# Patient Record
Sex: Female | Born: 1970 | Race: White | Hispanic: No | Smoking: Never smoker
Health system: Southern US, Community
[De-identification: ages and names within clinical notes are randomized; demographics above are authoritative.]

## PROBLEM LIST (undated history)

## (undated) DIAGNOSIS — G8929 Other chronic pain: Secondary | ICD-10-CM

## (undated) DIAGNOSIS — E109 Type 1 diabetes mellitus without complications: Secondary | ICD-10-CM

## (undated) DIAGNOSIS — B9689 Other specified bacterial agents as the cause of diseases classified elsewhere: Secondary | ICD-10-CM

## (undated) DIAGNOSIS — Z9151 Personal history of suicidal behavior: Secondary | ICD-10-CM

## (undated) DIAGNOSIS — F339 Major depressive disorder, recurrent, unspecified: Secondary | ICD-10-CM

## (undated) DIAGNOSIS — IMO0002 Reserved for concepts with insufficient information to code with codable children: Secondary | ICD-10-CM

## (undated) DIAGNOSIS — J208 Acute bronchitis due to other specified organisms: Secondary | ICD-10-CM

## (undated) DIAGNOSIS — Z87442 Personal history of urinary calculi: Secondary | ICD-10-CM

## (undated) DIAGNOSIS — Z915 Personal history of self-harm: Secondary | ICD-10-CM

## (undated) DIAGNOSIS — Z8719 Personal history of other diseases of the digestive system: Secondary | ICD-10-CM

## (undated) DIAGNOSIS — Z9889 Other specified postprocedural states: Secondary | ICD-10-CM

## (undated) DIAGNOSIS — R112 Nausea with vomiting, unspecified: Secondary | ICD-10-CM

## (undated) DIAGNOSIS — K9081 Whipple's disease: Secondary | ICD-10-CM

## (undated) DIAGNOSIS — K219 Gastro-esophageal reflux disease without esophagitis: Secondary | ICD-10-CM

## (undated) DIAGNOSIS — K589 Irritable bowel syndrome without diarrhea: Secondary | ICD-10-CM

## (undated) DIAGNOSIS — Z8679 Personal history of other diseases of the circulatory system: Secondary | ICD-10-CM

## (undated) DIAGNOSIS — E1165 Type 2 diabetes mellitus with hyperglycemia: Secondary | ICD-10-CM

## (undated) DIAGNOSIS — F419 Anxiety disorder, unspecified: Secondary | ICD-10-CM

## (undated) DIAGNOSIS — G43909 Migraine, unspecified, not intractable, without status migrainosus: Secondary | ICD-10-CM

## (undated) DIAGNOSIS — N201 Calculus of ureter: Secondary | ICD-10-CM

## (undated) HISTORY — PX: OTHER SURGICAL HISTORY: SHX169

## (undated) HISTORY — PX: WHIPPLE PROCEDURE: SHX2667

## (undated) HISTORY — DX: Type 1 diabetes mellitus without complications: E10.9

## (undated) HISTORY — DX: Anxiety disorder, unspecified: F41.9

## (undated) HISTORY — DX: Migraine, unspecified, not intractable, without status migrainosus: G43.909

## (undated) HISTORY — PX: TUBAL LIGATION: SHX77

## (undated) HISTORY — PX: ESOPHAGOGASTRODUODENOSCOPY: SHX1529

---

## 2000-08-08 HISTORY — PX: LAPAROSCOPIC NISSEN FUNDOPLICATION: SHX1932

## 2001-03-02 ENCOUNTER — Inpatient Hospital Stay (HOSPITAL_COMMUNITY): Admission: EM | Admit: 2001-03-02 | Discharge: 2001-03-06 | Payer: Self-pay | Admitting: *Deleted

## 2001-03-12 ENCOUNTER — Inpatient Hospital Stay (HOSPITAL_COMMUNITY): Admission: EM | Admit: 2001-03-12 | Discharge: 2001-03-13 | Payer: Self-pay | Admitting: Psychiatry

## 2001-03-20 ENCOUNTER — Emergency Department (HOSPITAL_COMMUNITY): Admission: EM | Admit: 2001-03-20 | Discharge: 2001-03-20 | Payer: Self-pay | Admitting: Emergency Medicine

## 2001-03-22 ENCOUNTER — Emergency Department (HOSPITAL_COMMUNITY): Admission: EM | Admit: 2001-03-22 | Discharge: 2001-03-22 | Payer: Self-pay

## 2001-03-22 ENCOUNTER — Emergency Department (HOSPITAL_COMMUNITY): Admission: EM | Admit: 2001-03-22 | Discharge: 2001-03-22 | Payer: Self-pay | Admitting: Emergency Medicine

## 2001-03-22 ENCOUNTER — Emergency Department (HOSPITAL_COMMUNITY): Admission: EM | Admit: 2001-03-22 | Discharge: 2001-03-23 | Payer: Self-pay | Admitting: Emergency Medicine

## 2001-03-24 ENCOUNTER — Emergency Department (HOSPITAL_COMMUNITY): Admission: EM | Admit: 2001-03-24 | Discharge: 2001-03-24 | Payer: Self-pay | Admitting: Emergency Medicine

## 2001-04-11 ENCOUNTER — Emergency Department (HOSPITAL_COMMUNITY): Admission: EM | Admit: 2001-04-11 | Discharge: 2001-04-11 | Payer: Self-pay | Admitting: Emergency Medicine

## 2001-04-20 ENCOUNTER — Emergency Department (HOSPITAL_COMMUNITY): Admission: EM | Admit: 2001-04-20 | Discharge: 2001-04-20 | Payer: Self-pay | Admitting: Emergency Medicine

## 2001-04-27 ENCOUNTER — Emergency Department (HOSPITAL_COMMUNITY): Admission: EM | Admit: 2001-04-27 | Discharge: 2001-04-27 | Payer: Self-pay | Admitting: Emergency Medicine

## 2001-04-30 ENCOUNTER — Other Ambulatory Visit: Admission: RE | Admit: 2001-04-30 | Discharge: 2001-04-30 | Payer: Self-pay | Admitting: Gynecology

## 2001-05-19 ENCOUNTER — Emergency Department (HOSPITAL_COMMUNITY): Admission: EM | Admit: 2001-05-19 | Discharge: 2001-05-19 | Payer: Self-pay | Admitting: Emergency Medicine

## 2001-05-20 ENCOUNTER — Emergency Department (HOSPITAL_COMMUNITY): Admission: EM | Admit: 2001-05-20 | Discharge: 2001-05-20 | Payer: Self-pay | Admitting: Emergency Medicine

## 2001-06-09 ENCOUNTER — Emergency Department (HOSPITAL_COMMUNITY): Admission: EM | Admit: 2001-06-09 | Discharge: 2001-06-09 | Payer: Self-pay | Admitting: Emergency Medicine

## 2001-06-19 ENCOUNTER — Emergency Department (HOSPITAL_COMMUNITY): Admission: EM | Admit: 2001-06-19 | Discharge: 2001-06-20 | Payer: Self-pay | Admitting: Emergency Medicine

## 2001-08-06 ENCOUNTER — Encounter (HOSPITAL_COMMUNITY): Admission: RE | Admit: 2001-08-06 | Discharge: 2001-09-05 | Payer: Self-pay | Admitting: Neurology

## 2001-10-01 ENCOUNTER — Emergency Department (HOSPITAL_COMMUNITY): Admission: EM | Admit: 2001-10-01 | Discharge: 2001-10-01 | Payer: Self-pay | Admitting: Emergency Medicine

## 2001-10-05 ENCOUNTER — Emergency Department (HOSPITAL_COMMUNITY): Admission: EM | Admit: 2001-10-05 | Discharge: 2001-10-05 | Payer: Self-pay | Admitting: Emergency Medicine

## 2001-10-08 ENCOUNTER — Emergency Department (HOSPITAL_COMMUNITY): Admission: EM | Admit: 2001-10-08 | Discharge: 2001-10-08 | Payer: Self-pay | Admitting: Internal Medicine

## 2001-10-26 ENCOUNTER — Inpatient Hospital Stay (HOSPITAL_COMMUNITY): Admission: EM | Admit: 2001-10-26 | Discharge: 2001-10-31 | Payer: Self-pay | Admitting: Internal Medicine

## 2001-10-26 ENCOUNTER — Encounter: Payer: Self-pay | Admitting: Internal Medicine

## 2001-10-27 ENCOUNTER — Encounter: Payer: Self-pay | Admitting: Internal Medicine

## 2001-11-03 ENCOUNTER — Encounter: Payer: Self-pay | Admitting: Internal Medicine

## 2001-11-03 ENCOUNTER — Ambulatory Visit (HOSPITAL_COMMUNITY): Admission: RE | Admit: 2001-11-03 | Discharge: 2001-11-03 | Payer: Self-pay | Admitting: Internal Medicine

## 2001-11-30 ENCOUNTER — Inpatient Hospital Stay (HOSPITAL_COMMUNITY): Admission: EM | Admit: 2001-11-30 | Discharge: 2001-12-01 | Payer: Self-pay | Admitting: Emergency Medicine

## 2001-12-01 ENCOUNTER — Inpatient Hospital Stay (HOSPITAL_COMMUNITY): Admission: RE | Admit: 2001-12-01 | Discharge: 2001-12-09 | Payer: Self-pay | Admitting: Psychiatry

## 2001-12-31 ENCOUNTER — Inpatient Hospital Stay (HOSPITAL_COMMUNITY): Admission: EM | Admit: 2001-12-31 | Discharge: 2002-01-05 | Payer: Self-pay | Admitting: Psychiatry

## 2002-01-13 ENCOUNTER — Emergency Department (HOSPITAL_COMMUNITY): Admission: EM | Admit: 2002-01-13 | Discharge: 2002-01-13 | Payer: Self-pay | Admitting: Emergency Medicine

## 2002-01-16 ENCOUNTER — Emergency Department (HOSPITAL_COMMUNITY): Admission: EM | Admit: 2002-01-16 | Discharge: 2002-01-16 | Payer: Self-pay | Admitting: Emergency Medicine

## 2002-02-16 ENCOUNTER — Emergency Department (HOSPITAL_COMMUNITY): Admission: EM | Admit: 2002-02-16 | Discharge: 2002-02-16 | Payer: Self-pay | Admitting: *Deleted

## 2002-02-18 ENCOUNTER — Emergency Department (HOSPITAL_COMMUNITY): Admission: EM | Admit: 2002-02-18 | Discharge: 2002-02-18 | Payer: Self-pay | Admitting: Emergency Medicine

## 2002-02-27 ENCOUNTER — Emergency Department (HOSPITAL_COMMUNITY): Admission: EM | Admit: 2002-02-27 | Discharge: 2002-02-28 | Payer: Self-pay | Admitting: *Deleted

## 2002-03-09 ENCOUNTER — Emergency Department (HOSPITAL_COMMUNITY): Admission: EM | Admit: 2002-03-09 | Discharge: 2002-03-09 | Payer: Self-pay | Admitting: Emergency Medicine

## 2002-03-15 ENCOUNTER — Emergency Department (HOSPITAL_COMMUNITY): Admission: EM | Admit: 2002-03-15 | Discharge: 2002-03-15 | Payer: Self-pay | Admitting: Internal Medicine

## 2002-04-07 ENCOUNTER — Emergency Department (HOSPITAL_COMMUNITY): Admission: EM | Admit: 2002-04-07 | Discharge: 2002-04-07 | Payer: Self-pay | Admitting: Emergency Medicine

## 2002-04-10 ENCOUNTER — Emergency Department (HOSPITAL_COMMUNITY): Admission: EM | Admit: 2002-04-10 | Discharge: 2002-04-10 | Payer: Self-pay | Admitting: Internal Medicine

## 2002-04-26 ENCOUNTER — Emergency Department (HOSPITAL_COMMUNITY): Admission: EM | Admit: 2002-04-26 | Discharge: 2002-04-26 | Payer: Self-pay | Admitting: Emergency Medicine

## 2002-04-28 ENCOUNTER — Inpatient Hospital Stay (HOSPITAL_COMMUNITY): Admission: EM | Admit: 2002-04-28 | Discharge: 2002-05-06 | Payer: Self-pay | Admitting: *Deleted

## 2002-04-29 ENCOUNTER — Encounter: Payer: Self-pay | Admitting: Family Medicine

## 2002-05-04 ENCOUNTER — Encounter: Payer: Self-pay | Admitting: Family Medicine

## 2002-05-15 ENCOUNTER — Inpatient Hospital Stay (HOSPITAL_COMMUNITY): Admission: EM | Admit: 2002-05-15 | Discharge: 2002-05-20 | Payer: Self-pay | Admitting: Emergency Medicine

## 2002-05-15 ENCOUNTER — Encounter: Payer: Self-pay | Admitting: Emergency Medicine

## 2002-05-20 ENCOUNTER — Encounter: Payer: Self-pay | Admitting: Internal Medicine

## 2002-06-04 ENCOUNTER — Inpatient Hospital Stay (HOSPITAL_COMMUNITY): Admission: EM | Admit: 2002-06-04 | Discharge: 2002-06-09 | Payer: Self-pay | Admitting: Internal Medicine

## 2002-06-05 ENCOUNTER — Encounter: Payer: Self-pay | Admitting: Internal Medicine

## 2002-07-08 ENCOUNTER — Encounter: Payer: Self-pay | Admitting: Family Medicine

## 2002-07-08 ENCOUNTER — Ambulatory Visit (HOSPITAL_COMMUNITY): Admission: RE | Admit: 2002-07-08 | Discharge: 2002-07-08 | Payer: Self-pay | Admitting: Family Medicine

## 2002-08-10 ENCOUNTER — Encounter: Payer: Self-pay | Admitting: Emergency Medicine

## 2002-08-10 ENCOUNTER — Inpatient Hospital Stay (HOSPITAL_COMMUNITY): Admission: EM | Admit: 2002-08-10 | Discharge: 2002-08-12 | Payer: Self-pay | Admitting: Emergency Medicine

## 2002-08-11 ENCOUNTER — Encounter: Payer: Self-pay | Admitting: Family Medicine

## 2002-10-05 ENCOUNTER — Observation Stay (HOSPITAL_COMMUNITY): Admission: EM | Admit: 2002-10-05 | Discharge: 2002-10-05 | Payer: Self-pay | Admitting: Emergency Medicine

## 2002-11-05 ENCOUNTER — Emergency Department (HOSPITAL_COMMUNITY): Admission: EM | Admit: 2002-11-05 | Discharge: 2002-11-05 | Payer: Self-pay | Admitting: *Deleted

## 2002-12-14 ENCOUNTER — Emergency Department (HOSPITAL_COMMUNITY): Admission: EM | Admit: 2002-12-14 | Discharge: 2002-12-14 | Payer: Self-pay | Admitting: Emergency Medicine

## 2003-01-01 ENCOUNTER — Inpatient Hospital Stay (HOSPITAL_COMMUNITY): Admission: EM | Admit: 2003-01-01 | Discharge: 2003-01-07 | Payer: Self-pay | Admitting: Internal Medicine

## 2003-01-04 ENCOUNTER — Encounter: Payer: Self-pay | Admitting: Family Medicine

## 2003-01-19 ENCOUNTER — Emergency Department (HOSPITAL_COMMUNITY): Admission: EM | Admit: 2003-01-19 | Discharge: 2003-01-19 | Payer: Self-pay | Admitting: *Deleted

## 2003-02-03 ENCOUNTER — Emergency Department (HOSPITAL_COMMUNITY): Admission: EM | Admit: 2003-02-03 | Discharge: 2003-02-03 | Payer: Self-pay | Admitting: *Deleted

## 2003-02-07 ENCOUNTER — Emergency Department (HOSPITAL_COMMUNITY): Admission: EM | Admit: 2003-02-07 | Discharge: 2003-02-07 | Payer: Self-pay | Admitting: Emergency Medicine

## 2003-04-18 ENCOUNTER — Inpatient Hospital Stay (HOSPITAL_COMMUNITY): Admission: EM | Admit: 2003-04-18 | Discharge: 2003-04-24 | Payer: Self-pay | Admitting: Internal Medicine

## 2003-04-18 ENCOUNTER — Encounter: Payer: Self-pay | Admitting: Internal Medicine

## 2003-04-21 ENCOUNTER — Encounter: Payer: Self-pay | Admitting: Family Medicine

## 2003-04-22 ENCOUNTER — Encounter: Payer: Self-pay | Admitting: Family Medicine

## 2003-07-20 ENCOUNTER — Inpatient Hospital Stay (HOSPITAL_COMMUNITY): Admission: EM | Admit: 2003-07-20 | Discharge: 2003-07-27 | Payer: Self-pay | Admitting: Emergency Medicine

## 2003-07-22 ENCOUNTER — Encounter: Payer: Self-pay | Admitting: Family Medicine

## 2003-07-23 ENCOUNTER — Encounter: Payer: Self-pay | Admitting: Family Medicine

## 2003-08-10 ENCOUNTER — Inpatient Hospital Stay (HOSPITAL_COMMUNITY): Admission: EM | Admit: 2003-08-10 | Discharge: 2003-08-15 | Payer: Self-pay | Admitting: Emergency Medicine

## 2003-08-19 ENCOUNTER — Emergency Department (HOSPITAL_COMMUNITY): Admission: EM | Admit: 2003-08-19 | Discharge: 2003-08-19 | Payer: Self-pay | Admitting: Emergency Medicine

## 2003-09-01 ENCOUNTER — Emergency Department (HOSPITAL_COMMUNITY): Admission: EM | Admit: 2003-09-01 | Discharge: 2003-09-01 | Payer: Self-pay | Admitting: Emergency Medicine

## 2003-12-01 ENCOUNTER — Inpatient Hospital Stay (HOSPITAL_COMMUNITY): Admission: EM | Admit: 2003-12-01 | Discharge: 2003-12-03 | Payer: Self-pay | Admitting: Emergency Medicine

## 2004-01-15 ENCOUNTER — Inpatient Hospital Stay (HOSPITAL_COMMUNITY): Admission: EM | Admit: 2004-01-15 | Discharge: 2004-01-19 | Payer: Self-pay | Admitting: Emergency Medicine

## 2004-01-24 ENCOUNTER — Emergency Department (HOSPITAL_COMMUNITY): Admission: EM | Admit: 2004-01-24 | Discharge: 2004-01-24 | Payer: Self-pay | Admitting: Emergency Medicine

## 2004-02-09 ENCOUNTER — Emergency Department (HOSPITAL_COMMUNITY): Admission: EM | Admit: 2004-02-09 | Discharge: 2004-02-09 | Payer: Self-pay | Admitting: Emergency Medicine

## 2004-02-20 ENCOUNTER — Emergency Department (HOSPITAL_COMMUNITY): Admission: EM | Admit: 2004-02-20 | Discharge: 2004-02-20 | Payer: Self-pay | Admitting: Emergency Medicine

## 2004-04-12 ENCOUNTER — Inpatient Hospital Stay (HOSPITAL_COMMUNITY): Admission: EM | Admit: 2004-04-12 | Discharge: 2004-04-17 | Payer: Self-pay | Admitting: Emergency Medicine

## 2004-05-07 ENCOUNTER — Ambulatory Visit: Admission: RE | Admit: 2004-05-07 | Discharge: 2004-05-07 | Payer: Self-pay | Admitting: Family Medicine

## 2004-06-08 ENCOUNTER — Inpatient Hospital Stay (HOSPITAL_COMMUNITY): Admission: EM | Admit: 2004-06-08 | Discharge: 2004-06-12 | Payer: Self-pay | Admitting: Emergency Medicine

## 2004-07-07 ENCOUNTER — Inpatient Hospital Stay (HOSPITAL_COMMUNITY): Admission: EM | Admit: 2004-07-07 | Discharge: 2004-07-12 | Payer: Self-pay | Admitting: Emergency Medicine

## 2004-08-08 ENCOUNTER — Ambulatory Visit: Payer: Self-pay | Admitting: Psychiatry

## 2004-10-24 ENCOUNTER — Ambulatory Visit: Payer: Self-pay | Admitting: Psychiatry

## 2004-11-30 ENCOUNTER — Ambulatory Visit: Payer: Self-pay | Admitting: Psychiatry

## 2004-12-25 ENCOUNTER — Emergency Department (HOSPITAL_COMMUNITY): Admission: EM | Admit: 2004-12-25 | Discharge: 2004-12-25 | Payer: Self-pay | Admitting: Emergency Medicine

## 2005-01-25 ENCOUNTER — Inpatient Hospital Stay (HOSPITAL_COMMUNITY): Admission: EM | Admit: 2005-01-25 | Discharge: 2005-01-29 | Payer: Self-pay | Admitting: Emergency Medicine

## 2005-02-13 ENCOUNTER — Ambulatory Visit: Payer: Self-pay | Admitting: Psychiatry

## 2005-04-03 ENCOUNTER — Ambulatory Visit: Payer: Self-pay | Admitting: Psychiatry

## 2005-06-05 ENCOUNTER — Ambulatory Visit: Payer: Self-pay | Admitting: Psychiatry

## 2005-08-14 ENCOUNTER — Ambulatory Visit: Payer: Self-pay | Admitting: Psychiatry

## 2005-09-11 ENCOUNTER — Ambulatory Visit: Payer: Self-pay | Admitting: Psychiatry

## 2005-11-15 ENCOUNTER — Ambulatory Visit: Payer: Self-pay | Admitting: Psychiatry

## 2006-01-10 ENCOUNTER — Ambulatory Visit (HOSPITAL_COMMUNITY): Payer: Self-pay | Admitting: Psychiatry

## 2006-03-12 ENCOUNTER — Ambulatory Visit (HOSPITAL_COMMUNITY): Payer: Self-pay | Admitting: Psychiatry

## 2006-04-03 ENCOUNTER — Ambulatory Visit (HOSPITAL_COMMUNITY): Admission: RE | Admit: 2006-04-03 | Discharge: 2006-04-03 | Payer: Self-pay | Admitting: Family Medicine

## 2006-04-04 ENCOUNTER — Ambulatory Visit: Payer: Self-pay | Admitting: Internal Medicine

## 2006-05-09 ENCOUNTER — Ambulatory Visit (HOSPITAL_COMMUNITY): Payer: Self-pay | Admitting: Psychiatry

## 2006-06-20 ENCOUNTER — Ambulatory Visit (HOSPITAL_COMMUNITY): Payer: Self-pay | Admitting: Psychiatry

## 2006-06-29 ENCOUNTER — Inpatient Hospital Stay (HOSPITAL_COMMUNITY): Admission: EM | Admit: 2006-06-29 | Discharge: 2006-07-02 | Payer: Self-pay | Admitting: Emergency Medicine

## 2006-06-29 ENCOUNTER — Ambulatory Visit: Payer: Self-pay | Admitting: Urgent Care

## 2006-08-20 ENCOUNTER — Ambulatory Visit (HOSPITAL_COMMUNITY): Payer: Self-pay | Admitting: Psychiatry

## 2006-11-05 ENCOUNTER — Ambulatory Visit (HOSPITAL_COMMUNITY): Payer: Self-pay | Admitting: Psychiatry

## 2006-12-16 ENCOUNTER — Ambulatory Visit (HOSPITAL_COMMUNITY): Admission: RE | Admit: 2006-12-16 | Discharge: 2006-12-17 | Payer: Self-pay | Admitting: Obstetrics and Gynecology

## 2006-12-16 ENCOUNTER — Encounter (INDEPENDENT_AMBULATORY_CARE_PROVIDER_SITE_OTHER): Payer: Self-pay | Admitting: Specialist

## 2006-12-16 HISTORY — PX: LAPAROSCOPIC ASSISTED VAGINAL HYSTERECTOMY: SHX5398

## 2007-01-07 ENCOUNTER — Ambulatory Visit (HOSPITAL_COMMUNITY): Payer: Self-pay | Admitting: Psychiatry

## 2007-01-14 ENCOUNTER — Ambulatory Visit (HOSPITAL_COMMUNITY): Payer: Self-pay | Admitting: Psychiatry

## 2007-01-21 ENCOUNTER — Ambulatory Visit (HOSPITAL_COMMUNITY): Payer: Self-pay | Admitting: Psychiatry

## 2007-02-05 ENCOUNTER — Ambulatory Visit (HOSPITAL_COMMUNITY): Payer: Self-pay | Admitting: Psychiatry

## 2007-04-03 ENCOUNTER — Ambulatory Visit (HOSPITAL_COMMUNITY): Payer: Self-pay | Admitting: Psychiatry

## 2007-04-21 ENCOUNTER — Ambulatory Visit (HOSPITAL_COMMUNITY): Payer: Self-pay | Admitting: Psychiatry

## 2007-05-29 ENCOUNTER — Ambulatory Visit (HOSPITAL_COMMUNITY): Payer: Self-pay | Admitting: Psychiatry

## 2007-08-10 ENCOUNTER — Inpatient Hospital Stay (HOSPITAL_COMMUNITY): Admission: EM | Admit: 2007-08-10 | Discharge: 2007-08-14 | Payer: Self-pay | Admitting: Emergency Medicine

## 2007-09-16 ENCOUNTER — Ambulatory Visit (HOSPITAL_COMMUNITY): Payer: Self-pay | Admitting: Psychiatry

## 2007-10-16 ENCOUNTER — Ambulatory Visit (HOSPITAL_COMMUNITY): Payer: Self-pay | Admitting: Psychiatry

## 2008-01-01 ENCOUNTER — Ambulatory Visit (HOSPITAL_COMMUNITY): Payer: Self-pay | Admitting: Psychiatry

## 2008-03-02 ENCOUNTER — Ambulatory Visit (HOSPITAL_COMMUNITY): Payer: Self-pay | Admitting: Psychiatry

## 2008-04-27 ENCOUNTER — Ambulatory Visit (HOSPITAL_COMMUNITY): Admission: RE | Admit: 2008-04-27 | Discharge: 2008-04-27 | Payer: Self-pay | Admitting: Family Medicine

## 2008-04-29 ENCOUNTER — Ambulatory Visit (HOSPITAL_COMMUNITY): Payer: Self-pay | Admitting: Psychiatry

## 2008-05-22 ENCOUNTER — Emergency Department (HOSPITAL_COMMUNITY): Admission: EM | Admit: 2008-05-22 | Discharge: 2008-05-22 | Payer: Self-pay | Admitting: Emergency Medicine

## 2008-06-29 ENCOUNTER — Ambulatory Visit (HOSPITAL_COMMUNITY): Payer: Self-pay | Admitting: Psychiatry

## 2008-08-01 ENCOUNTER — Emergency Department (HOSPITAL_COMMUNITY): Admission: EM | Admit: 2008-08-01 | Discharge: 2008-08-01 | Payer: Self-pay | Admitting: Emergency Medicine

## 2008-09-14 ENCOUNTER — Ambulatory Visit (HOSPITAL_COMMUNITY): Payer: Self-pay | Admitting: Psychiatry

## 2008-10-17 ENCOUNTER — Emergency Department (HOSPITAL_COMMUNITY): Admission: EM | Admit: 2008-10-17 | Discharge: 2008-10-17 | Payer: Self-pay | Admitting: Emergency Medicine

## 2008-11-23 ENCOUNTER — Ambulatory Visit (HOSPITAL_COMMUNITY): Payer: Self-pay | Admitting: Psychiatry

## 2008-11-27 ENCOUNTER — Emergency Department (HOSPITAL_COMMUNITY): Admission: EM | Admit: 2008-11-27 | Discharge: 2008-11-27 | Payer: Self-pay | Admitting: Emergency Medicine

## 2008-11-27 ENCOUNTER — Encounter: Payer: Self-pay | Admitting: Orthopedic Surgery

## 2008-11-29 ENCOUNTER — Ambulatory Visit: Payer: Self-pay | Admitting: Orthopedic Surgery

## 2008-11-29 DIAGNOSIS — S93409A Sprain of unspecified ligament of unspecified ankle, initial encounter: Secondary | ICD-10-CM | POA: Insufficient documentation

## 2008-12-17 ENCOUNTER — Encounter: Payer: Self-pay | Admitting: Orthopedic Surgery

## 2008-12-30 ENCOUNTER — Observation Stay (HOSPITAL_COMMUNITY): Admission: AD | Admit: 2008-12-30 | Discharge: 2009-01-01 | Payer: Self-pay | Admitting: Family Medicine

## 2008-12-30 ENCOUNTER — Ambulatory Visit: Payer: Self-pay | Admitting: Internal Medicine

## 2008-12-31 ENCOUNTER — Ambulatory Visit: Payer: Self-pay | Admitting: Internal Medicine

## 2008-12-31 ENCOUNTER — Encounter: Payer: Self-pay | Admitting: Internal Medicine

## 2009-01-13 ENCOUNTER — Encounter: Payer: Self-pay | Admitting: Internal Medicine

## 2009-01-17 ENCOUNTER — Encounter: Payer: Self-pay | Admitting: Internal Medicine

## 2009-01-18 ENCOUNTER — Encounter: Payer: Self-pay | Admitting: Internal Medicine

## 2009-02-03 ENCOUNTER — Ambulatory Visit (HOSPITAL_COMMUNITY): Payer: Self-pay | Admitting: Psychiatry

## 2009-02-03 DIAGNOSIS — Z87442 Personal history of urinary calculi: Secondary | ICD-10-CM | POA: Insufficient documentation

## 2009-02-03 DIAGNOSIS — Z8719 Personal history of other diseases of the digestive system: Secondary | ICD-10-CM | POA: Insufficient documentation

## 2009-02-03 DIAGNOSIS — G43909 Migraine, unspecified, not intractable, without status migrainosus: Secondary | ICD-10-CM | POA: Insufficient documentation

## 2009-02-03 DIAGNOSIS — F319 Bipolar disorder, unspecified: Secondary | ICD-10-CM

## 2009-02-03 DIAGNOSIS — K219 Gastro-esophageal reflux disease without esophagitis: Secondary | ICD-10-CM

## 2009-03-03 ENCOUNTER — Inpatient Hospital Stay (HOSPITAL_COMMUNITY): Admission: AD | Admit: 2009-03-03 | Discharge: 2009-03-10 | Payer: Self-pay | Admitting: Family Medicine

## 2009-03-08 ENCOUNTER — Ambulatory Visit: Payer: Self-pay | Admitting: Internal Medicine

## 2009-03-09 ENCOUNTER — Encounter: Payer: Self-pay | Admitting: Internal Medicine

## 2009-03-14 ENCOUNTER — Ambulatory Visit: Payer: Self-pay | Admitting: Internal Medicine

## 2009-03-14 ENCOUNTER — Ambulatory Visit (HOSPITAL_COMMUNITY): Admission: RE | Admit: 2009-03-14 | Discharge: 2009-03-14 | Payer: Self-pay | Admitting: Internal Medicine

## 2009-03-14 HISTORY — PX: COLONOSCOPY: SHX174

## 2009-03-15 ENCOUNTER — Ambulatory Visit (HOSPITAL_COMMUNITY): Payer: Self-pay | Admitting: Psychiatry

## 2009-04-04 ENCOUNTER — Encounter: Payer: Self-pay | Admitting: Internal Medicine

## 2009-04-04 ENCOUNTER — Emergency Department (HOSPITAL_COMMUNITY): Admission: EM | Admit: 2009-04-04 | Discharge: 2009-04-04 | Payer: Self-pay | Admitting: Emergency Medicine

## 2009-05-03 ENCOUNTER — Emergency Department (HOSPITAL_COMMUNITY): Admission: EM | Admit: 2009-05-03 | Discharge: 2009-05-03 | Payer: Self-pay | Admitting: Emergency Medicine

## 2009-06-01 ENCOUNTER — Emergency Department (HOSPITAL_COMMUNITY): Admission: EM | Admit: 2009-06-01 | Discharge: 2009-06-01 | Payer: Self-pay | Admitting: Emergency Medicine

## 2009-06-03 ENCOUNTER — Inpatient Hospital Stay (HOSPITAL_COMMUNITY): Admission: RE | Admit: 2009-06-03 | Discharge: 2009-06-09 | Payer: Self-pay | Admitting: Family Medicine

## 2009-06-04 ENCOUNTER — Ambulatory Visit: Payer: Self-pay | Admitting: Gastroenterology

## 2009-06-06 ENCOUNTER — Ambulatory Visit: Payer: Self-pay | Admitting: Internal Medicine

## 2009-06-08 ENCOUNTER — Ambulatory Visit: Payer: Self-pay | Admitting: Gastroenterology

## 2009-06-10 ENCOUNTER — Encounter: Payer: Self-pay | Admitting: Gastroenterology

## 2009-06-14 ENCOUNTER — Ambulatory Visit (HOSPITAL_COMMUNITY): Payer: Self-pay | Admitting: Psychiatry

## 2009-07-18 ENCOUNTER — Emergency Department (HOSPITAL_COMMUNITY): Admission: EM | Admit: 2009-07-18 | Discharge: 2009-07-18 | Payer: Self-pay | Admitting: Emergency Medicine

## 2009-07-21 ENCOUNTER — Inpatient Hospital Stay (HOSPITAL_COMMUNITY): Admission: EM | Admit: 2009-07-21 | Discharge: 2009-07-30 | Payer: Self-pay | Admitting: Emergency Medicine

## 2009-07-26 ENCOUNTER — Ambulatory Visit: Payer: Self-pay | Admitting: Gastroenterology

## 2009-07-29 ENCOUNTER — Telehealth: Payer: Self-pay | Admitting: Gastroenterology

## 2009-08-14 ENCOUNTER — Emergency Department (HOSPITAL_COMMUNITY): Admission: EM | Admit: 2009-08-14 | Discharge: 2009-08-14 | Payer: Self-pay | Admitting: Emergency Medicine

## 2009-08-25 ENCOUNTER — Telehealth (INDEPENDENT_AMBULATORY_CARE_PROVIDER_SITE_OTHER): Payer: Self-pay

## 2009-10-08 DIAGNOSIS — K9081 Whipple's disease: Secondary | ICD-10-CM

## 2009-10-08 HISTORY — DX: Whipple's disease: K90.81

## 2009-11-17 ENCOUNTER — Ambulatory Visit (HOSPITAL_COMMUNITY): Admission: RE | Admit: 2009-11-17 | Discharge: 2009-11-17 | Payer: Self-pay | Admitting: Family Medicine

## 2009-11-22 ENCOUNTER — Ambulatory Visit (HOSPITAL_COMMUNITY): Payer: Self-pay | Admitting: Psychiatry

## 2010-01-31 ENCOUNTER — Ambulatory Visit (HOSPITAL_COMMUNITY): Payer: Self-pay | Admitting: Psychiatry

## 2010-02-19 ENCOUNTER — Inpatient Hospital Stay (HOSPITAL_COMMUNITY): Admission: EM | Admit: 2010-02-19 | Discharge: 2010-02-25 | Payer: Self-pay | Admitting: Emergency Medicine

## 2010-03-10 ENCOUNTER — Emergency Department (HOSPITAL_COMMUNITY): Admission: EM | Admit: 2010-03-10 | Discharge: 2010-03-11 | Payer: Self-pay | Admitting: Emergency Medicine

## 2010-03-15 ENCOUNTER — Encounter (INDEPENDENT_AMBULATORY_CARE_PROVIDER_SITE_OTHER): Payer: Self-pay | Admitting: *Deleted

## 2010-03-16 ENCOUNTER — Emergency Department (HOSPITAL_COMMUNITY): Admission: EM | Admit: 2010-03-16 | Discharge: 2010-03-16 | Payer: Self-pay | Admitting: Emergency Medicine

## 2010-03-23 ENCOUNTER — Emergency Department (HOSPITAL_COMMUNITY): Admission: EM | Admit: 2010-03-23 | Discharge: 2010-03-23 | Payer: Self-pay | Admitting: Emergency Medicine

## 2010-03-30 ENCOUNTER — Inpatient Hospital Stay (HOSPITAL_COMMUNITY): Admission: EM | Admit: 2010-03-30 | Discharge: 2010-04-03 | Payer: Self-pay | Admitting: Emergency Medicine

## 2010-05-04 ENCOUNTER — Inpatient Hospital Stay (HOSPITAL_COMMUNITY): Admission: EM | Admit: 2010-05-04 | Discharge: 2010-05-07 | Payer: Self-pay | Admitting: Emergency Medicine

## 2010-05-18 ENCOUNTER — Ambulatory Visit (HOSPITAL_COMMUNITY): Payer: Self-pay | Admitting: Psychiatry

## 2010-05-29 ENCOUNTER — Emergency Department (HOSPITAL_COMMUNITY): Admission: EM | Admit: 2010-05-29 | Discharge: 2010-05-29 | Payer: Self-pay | Admitting: Emergency Medicine

## 2010-06-16 ENCOUNTER — Emergency Department (HOSPITAL_COMMUNITY): Admission: EM | Admit: 2010-06-16 | Discharge: 2010-06-16 | Payer: Self-pay | Admitting: Emergency Medicine

## 2010-07-09 ENCOUNTER — Emergency Department (HOSPITAL_COMMUNITY): Admission: EM | Admit: 2010-07-09 | Discharge: 2010-07-09 | Payer: Self-pay | Admitting: Pediatrics

## 2010-07-10 ENCOUNTER — Emergency Department (HOSPITAL_COMMUNITY): Admission: EM | Admit: 2010-07-10 | Discharge: 2010-07-10 | Payer: Self-pay | Admitting: Emergency Medicine

## 2010-07-27 ENCOUNTER — Emergency Department (HOSPITAL_COMMUNITY): Admission: EM | Admit: 2010-07-27 | Discharge: 2010-07-27 | Payer: Self-pay | Admitting: Emergency Medicine

## 2010-10-29 ENCOUNTER — Emergency Department (HOSPITAL_COMMUNITY)
Admission: EM | Admit: 2010-10-29 | Discharge: 2010-10-29 | Disposition: A | Discharge status: Other Acute Inpatient Hospital | Payer: Self-pay | Source: Home / Self Care | Admitting: Emergency Medicine

## 2010-10-31 LAB — URINALYSIS, ROUTINE W REFLEX MICROSCOPIC
Ketones, ur: NEGATIVE mg/dL
Nitrite: NEGATIVE
Protein, ur: NEGATIVE mg/dL

## 2010-10-31 LAB — DIFFERENTIAL
Eosinophils Relative: 0 % (ref 0–5)
Lymphocytes Relative: 11 % — ABNORMAL LOW (ref 12–46)
Lymphs Abs: 1.1 10*3/uL (ref 0.7–4.0)
Neutro Abs: 8.5 10*3/uL — ABNORMAL HIGH (ref 1.7–7.7)

## 2010-10-31 LAB — COMPREHENSIVE METABOLIC PANEL
ALT: 169 U/L — ABNORMAL HIGH (ref 0–35)
Alkaline Phosphatase: 483 U/L — ABNORMAL HIGH (ref 39–117)
BUN: 9 mg/dL (ref 6–23)
CO2: 22 mEq/L (ref 19–32)
Chloride: 98 mEq/L (ref 96–112)
GFR calc non Af Amer: >60 mL/min (ref >60)
Glucose, Bld: 188 mg/dL — ABNORMAL HIGH (ref 70–99)
Potassium: 4.3 mEq/L (ref 3.5–5.1)
Total Bilirubin: 1.6 mg/dL — ABNORMAL HIGH (ref 0.3–1.2)

## 2010-10-31 LAB — CBC
HCT: 34 % — ABNORMAL LOW (ref 36.0–46.0)
Hemoglobin: 10.9 g/dL — ABNORMAL LOW (ref 12.0–15.0)
MCV: 78.2 fL (ref 78.0–100.0)
RBC: 4.35 MIL/uL (ref 3.87–5.11)
RDW: 16.5 % — ABNORMAL HIGH (ref 11.5–15.5)
WBC: 10.4 10*3/uL (ref 4.0–10.5)

## 2010-10-31 LAB — LIPASE, BLOOD: Lipase: 129 U/L — ABNORMAL HIGH (ref 11–59)

## 2010-11-07 NOTE — Letter (Signed)
Summary: Unable to Reach, Consult Scheduled  [facility]  [address]   [place], [place] [address]   Phone: [phone]  Fax: [phone]    03/15/2010  BRITANI BEATTIE [address] [place], [place]  [address] 08-Dec-1970   Dear Ms. Allison Quarry,   We have been unable to reach you by phone.  Please contact our office with an updated phone number.  At the recommendation of DR KNAPP  we have been asked to schedule you a consult with DR [name] for ABDOMINAL PAIN.  Please call our office at [phone].     Thank you,    Diana Eves  [facility] R. Roetta Sessions, M.D.    Jonette Eva, M.D. Lorenza Burton, FNP-BC    Tana Coast, PA-C Phone: [phone]    Fax: [phone]

## 2010-12-20 LAB — COMPREHENSIVE METABOLIC PANEL
ALT: 115 U/L — ABNORMAL HIGH (ref 0–35)
AST: 61 U/L — ABNORMAL HIGH (ref 0–37)
AST: 67 U/L — ABNORMAL HIGH (ref 0–37)
Albumin: 3.6 g/dL (ref 3.5–5.2)
Albumin: 4.1 g/dL (ref 3.5–5.2)
BUN: 9 mg/dL (ref 6–23)
CO2: 31 mEq/L (ref 19–32)
CO2: 24 mEq/L (ref 19–32)
CO2: 26 mEq/L (ref 19–32)
Calcium: 9.2 mg/dL (ref 8.4–10.5)
Calcium: 8.5 mg/dL (ref 8.4–10.5)
Chloride: 101 mEq/L (ref 96–112)
Chloride: 98 mEq/L (ref 96–112)
Creatinine, Ser: 0.69 mg/dL (ref 0.4–1.2)
Creatinine, Ser: 0.64 mg/dL (ref 0.4–1.2)
Creatinine, Ser: 0.69 mg/dL (ref 0.4–1.2)
GFR calc Af Amer: >60 mL/min (ref >60)
GFR calc Af Amer: >60 mL/min (ref >60)
GFR calc Af Amer: >60 mL/min (ref >60)
GFR calc non Af Amer: >60 mL/min (ref >60)
GFR calc non Af Amer: >60 mL/min (ref >60)
GFR calc non Af Amer: >60 mL/min (ref >60)
Glucose, Bld: 166 mg/dL — ABNORMAL HIGH (ref 70–99)
Potassium: 3.7 mEq/L (ref 3.5–5.1)
Sodium: 131 mEq/L — ABNORMAL LOW (ref 135–145)
Total Bilirubin: 0.3 mg/dL (ref 0.3–1.2)
Total Bilirubin: 0.5 mg/dL (ref 0.3–1.2)
Total Protein: 6.5 g/dL (ref 6.0–8.3)

## 2010-12-20 LAB — DIFFERENTIAL
Basophils Absolute: 0.1 10*3/uL (ref 0.0–0.1)
Basophils Absolute: 0 10*3/uL (ref 0.0–0.1)
Eosinophils Absolute: 0 10*3/uL (ref 0.0–0.7)
Eosinophils Relative: 1 % (ref 0–5)
Eosinophils Relative: 0 % (ref 0–5)
Lymphocytes Relative: 31 % (ref 12–46)
Lymphocytes Relative: 35 % (ref 12–46)
Lymphocytes Relative: 37 % (ref 12–46)
Lymphs Abs: 2.3 10*3/uL (ref 0.7–4.0)
Lymphs Abs: 2.6 10*3/uL (ref 0.7–4.0)
Monocytes Absolute: 0.4 10*3/uL (ref 0.1–1.0)
Neutro Abs: 4.6 10*3/uL (ref 1.7–7.7)
Neutrophils Relative %: 58 % (ref 43–77)

## 2010-12-20 LAB — URINALYSIS, ROUTINE W REFLEX MICROSCOPIC
Bilirubin Urine: NEGATIVE
Nitrite: NEGATIVE
Specific Gravity, Urine: 1.025 (ref 1.005–1.030)
Urobilinogen, UA: 0.2 mg/dL (ref 0.0–1.0)
pH: 7 (ref 5.0–8.0)

## 2010-12-20 LAB — LIPASE, BLOOD
Lipase: 26 U/L (ref 11–59)
Lipase: 133 U/L — ABNORMAL HIGH (ref 11–59)

## 2010-12-20 LAB — CBC
HCT: 37 % (ref 36.0–46.0)
Hemoglobin: 12.8 g/dL (ref 12.0–15.0)
Hemoglobin: 13.8 g/dL (ref 12.0–15.0)
MCH: 33 pg (ref 26.0–34.0)
MCH: 33.1 pg (ref 26.0–34.0)
MCH: 33.7 pg (ref 26.0–34.0)
MCHC: 34.5 g/dL (ref 30.0–36.0)
MCHC: 34.5 g/dL (ref 30.0–36.0)
MCV: 95.9 fL (ref 78.0–100.0)
Platelets: 304 10*3/uL (ref 150–400)
Platelets: 242 10*3/uL (ref 150–400)
RBC: 4.16 MIL/uL (ref 3.87–5.11)
RDW: 12.8 % (ref 11.5–15.5)
WBC: 8.1 10*3/uL (ref 4.0–10.5)

## 2010-12-21 LAB — BASIC METABOLIC PANEL
BUN: 7 mg/dL (ref 6–23)
CO2: 20 mEq/L (ref 19–32)
Chloride: 105 mEq/L (ref 96–112)
Glucose, Bld: 209 mg/dL — ABNORMAL HIGH (ref 70–99)
Potassium: 4.3 mEq/L (ref 3.5–5.1)

## 2010-12-21 LAB — URINE CULTURE: Culture  Setup Time: 201108230325

## 2010-12-21 LAB — COMPREHENSIVE METABOLIC PANEL
ALT: 119 U/L — ABNORMAL HIGH (ref 0–35)
AST: 111 U/L — ABNORMAL HIGH (ref 0–37)
Albumin: 3.8 g/dL (ref 3.5–5.2)
CO2: 25 mEq/L (ref 19–32)
Calcium: 9.4 mg/dL (ref 8.4–10.5)
Chloride: 101 mEq/L (ref 96–112)
Creatinine, Ser: 0.64 mg/dL (ref 0.4–1.2)
GFR calc Af Amer: >60 mL/min (ref >60)
GFR calc non Af Amer: >60 mL/min (ref >60)
Sodium: 135 mEq/L (ref 135–145)
Total Bilirubin: 0.6 mg/dL (ref 0.3–1.2)

## 2010-12-21 LAB — DIFFERENTIAL
Eosinophils Absolute: 0 10*3/uL (ref 0.0–0.7)
Eosinophils Relative: 0 % (ref 0–5)
Lymphocytes Relative: 33 % (ref 12–46)
Lymphs Abs: 2.2 10*3/uL (ref 0.7–4.0)
Monocytes Absolute: 0.4 10*3/uL (ref 0.1–1.0)

## 2010-12-21 LAB — RAPID URINE DRUG SCREEN, HOSP PERFORMED
Barbiturates: NOT DETECTED
Cocaine: NOT DETECTED
Opiates: NOT DETECTED

## 2010-12-21 LAB — URINALYSIS, ROUTINE W REFLEX MICROSCOPIC
Bilirubin Urine: NEGATIVE
Hgb urine dipstick: NEGATIVE
Ketones, ur: NEGATIVE mg/dL
Specific Gravity, Urine: 1.02 (ref 1.005–1.030)
pH: 6 (ref 5.0–8.0)

## 2010-12-21 LAB — PREGNANCY, URINE: Preg Test, Ur: NEGATIVE

## 2010-12-21 LAB — ETHANOL: Alcohol, Ethyl (B): <5 mg/dL (ref 0–10)

## 2010-12-21 LAB — CBC
Hemoglobin: 13.4 g/dL (ref 12.0–15.0)
MCH: 33.3 pg (ref 26.0–34.0)
Platelets: 233 10*3/uL (ref 150–400)
RBC: 4.03 MIL/uL (ref 3.87–5.11)

## 2010-12-21 LAB — URINE MICROSCOPIC-ADD ON

## 2010-12-23 LAB — GLUCOSE, CAPILLARY
Glucose-Capillary: 168 mg/dL — ABNORMAL HIGH (ref 70–99)
Glucose-Capillary: 181 mg/dL — ABNORMAL HIGH (ref 70–99)

## 2010-12-23 LAB — COMPREHENSIVE METABOLIC PANEL
ALT: 87 U/L — ABNORMAL HIGH (ref 0–35)
Albumin: 4 g/dL (ref 3.5–5.2)
Alkaline Phosphatase: 62 U/L (ref 39–117)
Alkaline Phosphatase: 72 U/L (ref 39–117)
BUN: 8 mg/dL (ref 6–23)
Calcium: 8 mg/dL — ABNORMAL LOW (ref 8.4–10.5)
Glucose, Bld: 148 mg/dL — ABNORMAL HIGH (ref 70–99)
Glucose, Bld: 188 mg/dL — ABNORMAL HIGH (ref 70–99)
Potassium: 4.2 mEq/L (ref 3.5–5.1)
Sodium: 136 mEq/L (ref 135–145)
Total Protein: 6.1 g/dL (ref 6.0–8.3)
Total Protein: 7.5 g/dL (ref 6.0–8.3)

## 2010-12-23 LAB — URINE CULTURE: Culture: NO GROWTH

## 2010-12-23 LAB — DIFFERENTIAL
Basophils Relative: 0 % (ref 0–1)
Basophils Relative: 0 % (ref 0–1)
Eosinophils Absolute: 0 10*3/uL (ref 0.0–0.7)
Monocytes Absolute: 0.4 10*3/uL (ref 0.1–1.0)
Monocytes Relative: 4 % (ref 3–12)
Monocytes Relative: 6 % (ref 3–12)
Neutro Abs: 5.7 10*3/uL (ref 1.7–7.7)
Neutrophils Relative %: 64 % (ref 43–77)

## 2010-12-23 LAB — CBC
HCT: 38 % (ref 36.0–46.0)
HCT: 43.7 % (ref 36.0–46.0)
MCHC: 34.3 g/dL (ref 30.0–36.0)
MCV: 97.6 fL (ref 78.0–100.0)
Platelets: 231 10*3/uL (ref 150–400)
RBC: 4.49 MIL/uL (ref 3.87–5.11)
RDW: 13.3 % (ref 11.5–15.5)
RDW: 13.7 % (ref 11.5–15.5)
WBC: 11.3 10*3/uL — ABNORMAL HIGH (ref 4.0–10.5)

## 2010-12-23 LAB — URINE MICROSCOPIC-ADD ON

## 2010-12-23 LAB — LIPASE, BLOOD
Lipase: 309 U/L — ABNORMAL HIGH (ref 11–59)
Lipase: 94 U/L — ABNORMAL HIGH (ref 11–59)

## 2010-12-23 LAB — URINALYSIS, ROUTINE W REFLEX MICROSCOPIC
Glucose, UA: NEGATIVE mg/dL
Hgb urine dipstick: NEGATIVE
Ketones, ur: NEGATIVE mg/dL
pH: 6.5 (ref 5.0–8.0)

## 2010-12-24 LAB — DIFFERENTIAL
Basophils Absolute: 0 10*3/uL (ref 0.0–0.1)
Basophils Relative: 0 % (ref 0–1)
Basophils Relative: 0 % (ref 0–1)
Eosinophils Absolute: 0 10*3/uL (ref 0.0–0.7)
Eosinophils Absolute: 0.1 10*3/uL (ref 0.0–0.7)
Eosinophils Absolute: 0.1 10*3/uL (ref 0.0–0.7)
Eosinophils Relative: 1 % (ref 0–5)
Eosinophils Relative: 1 % (ref 0–5)
Lymphs Abs: 2.6 10*3/uL (ref 0.7–4.0)
Lymphs Abs: 2.9 10*3/uL (ref 0.7–4.0)
Lymphs Abs: 3.7 10*3/uL (ref 0.7–4.0)
Monocytes Absolute: 0.3 10*3/uL (ref 0.1–1.0)
Monocytes Absolute: 0.4 10*3/uL (ref 0.1–1.0)
Monocytes Relative: 5 % (ref 3–12)
Monocytes Relative: 6 % (ref 3–12)
Monocytes Relative: 6 % (ref 3–12)
Neutro Abs: 4.3 10*3/uL (ref 1.7–7.7)
Neutrophils Relative %: 50 % (ref 43–77)
Neutrophils Relative %: 54 % (ref 43–77)
Neutrophils Relative %: 64 % (ref 43–77)

## 2010-12-24 LAB — URINALYSIS, ROUTINE W REFLEX MICROSCOPIC
Glucose, UA: NEGATIVE mg/dL
Hgb urine dipstick: NEGATIVE
Leukocytes, UA: NEGATIVE
pH: 6 (ref 5.0–8.0)

## 2010-12-24 LAB — CBC
HCT: 34.2 % — ABNORMAL LOW (ref 36.0–46.0)
HCT: 45.7 % (ref 36.0–46.0)
Hemoglobin: 12.8 g/dL (ref 12.0–15.0)
MCHC: 34.2 g/dL (ref 30.0–36.0)
MCHC: 34.5 g/dL (ref 30.0–36.0)
Platelets: 172 10*3/uL (ref 150–400)
Platelets: 214 10*3/uL (ref 150–400)
Platelets: 272 10*3/uL (ref 150–400)
RBC: 3.86 MIL/uL — ABNORMAL LOW (ref 3.87–5.11)
RBC: 4.49 MIL/uL (ref 3.87–5.11)
RDW: 12.8 % (ref 11.5–15.5)
RDW: 13.1 % (ref 11.5–15.5)
RDW: 13.3 % (ref 11.5–15.5)
WBC: 4.4 10*3/uL (ref 4.0–10.5)
WBC: 8.6 10*3/uL (ref 4.0–10.5)
WBC: 9.5 10*3/uL (ref 4.0–10.5)

## 2010-12-24 LAB — COMPREHENSIVE METABOLIC PANEL
ALT: 44 U/L — ABNORMAL HIGH (ref 0–35)
ALT: 49 U/L — ABNORMAL HIGH (ref 0–35)
ALT: 66 U/L — ABNORMAL HIGH (ref 0–35)
AST: 27 U/L (ref 0–37)
AST: 37 U/L (ref 0–37)
Albumin: 3.1 g/dL — ABNORMAL LOW (ref 3.5–5.2)
Albumin: 4.1 g/dL (ref 3.5–5.2)
Albumin: 4.5 g/dL (ref 3.5–5.2)
Alkaline Phosphatase: 60 U/L (ref 39–117)
Calcium: 9 mg/dL (ref 8.4–10.5)
Calcium: 9.7 mg/dL (ref 8.4–10.5)
Creatinine, Ser: 0.62 mg/dL (ref 0.4–1.2)
GFR calc Af Amer: >60 mL/min (ref >60)
GFR calc Af Amer: >60 mL/min (ref >60)
GFR calc Af Amer: >60 mL/min (ref >60)
Glucose, Bld: 104 mg/dL — ABNORMAL HIGH (ref 70–99)
Glucose, Bld: 120 mg/dL — ABNORMAL HIGH (ref 70–99)
Potassium: 3.7 mEq/L (ref 3.5–5.1)
Potassium: 3.9 mEq/L (ref 3.5–5.1)
Sodium: 135 mEq/L (ref 135–145)
Sodium: 137 mEq/L (ref 135–145)
Sodium: 139 mEq/L (ref 135–145)
Total Protein: 5.5 g/dL — ABNORMAL LOW (ref 6.0–8.3)
Total Protein: 7.2 g/dL (ref 6.0–8.3)
Total Protein: 8 g/dL (ref 6.0–8.3)

## 2010-12-24 LAB — URINE MICROSCOPIC-ADD ON

## 2010-12-24 LAB — LIPASE, BLOOD: Lipase: 24 U/L (ref 11–59)

## 2010-12-24 LAB — GLUCOSE, CAPILLARY
Glucose-Capillary: 105 mg/dL — ABNORMAL HIGH (ref 70–99)
Glucose-Capillary: 105 mg/dL — ABNORMAL HIGH (ref 70–99)
Glucose-Capillary: 113 mg/dL — ABNORMAL HIGH (ref 70–99)
Glucose-Capillary: 115 mg/dL — ABNORMAL HIGH (ref 70–99)
Glucose-Capillary: 117 mg/dL — ABNORMAL HIGH (ref 70–99)
Glucose-Capillary: 118 mg/dL — ABNORMAL HIGH (ref 70–99)
Glucose-Capillary: 124 mg/dL — ABNORMAL HIGH (ref 70–99)
Glucose-Capillary: 130 mg/dL — ABNORMAL HIGH (ref 70–99)
Glucose-Capillary: 133 mg/dL — ABNORMAL HIGH (ref 70–99)
Glucose-Capillary: 137 mg/dL — ABNORMAL HIGH (ref 70–99)
Glucose-Capillary: 141 mg/dL — ABNORMAL HIGH (ref 70–99)

## 2010-12-24 LAB — BASIC METABOLIC PANEL
Calcium: 8.1 mg/dL — ABNORMAL LOW (ref 8.4–10.5)
GFR calc Af Amer: >60 mL/min (ref >60)
GFR calc non Af Amer: >60 mL/min (ref >60)
Sodium: 137 mEq/L (ref 135–145)

## 2010-12-24 LAB — URINE CULTURE: Colony Count: >=100000

## 2010-12-24 LAB — RAPID URINE DRUG SCREEN, HOSP PERFORMED: Cocaine: NOT DETECTED

## 2010-12-25 LAB — DIFFERENTIAL
Basophils Absolute: 0 10*3/uL (ref 0.0–0.1)
Basophils Absolute: 0 10*3/uL (ref 0.0–0.1)
Basophils Absolute: 0 10*3/uL (ref 0.0–0.1)
Basophils Relative: 0 % (ref 0–1)
Basophils Relative: 0 % (ref 0–1)
Basophils Relative: 1 % (ref 0–1)
Eosinophils Absolute: 0.1 10*3/uL (ref 0.0–0.7)
Eosinophils Absolute: 0.1 10*3/uL (ref 0.0–0.7)
Eosinophils Absolute: 0.1 10*3/uL (ref 0.0–0.7)
Eosinophils Relative: 1 % (ref 0–5)
Eosinophils Relative: 1 % (ref 0–5)
Eosinophils Relative: 2 % (ref 0–5)
Lymphocytes Relative: 43 % (ref 12–46)
Lymphocytes Relative: 51 % — ABNORMAL HIGH (ref 12–46)
Lymphocytes Relative: 36 % (ref 12–46)
Lymphocytes Relative: 46 % (ref 12–46)
Lymphs Abs: 3.4 10*3/uL (ref 0.7–4.0)
Lymphs Abs: 2.6 10*3/uL (ref 0.7–4.0)
Monocytes Absolute: 0.4 10*3/uL (ref 0.1–1.0)
Monocytes Absolute: 0.5 10*3/uL (ref 0.1–1.0)
Monocytes Relative: 6 % (ref 3–12)
Monocytes Relative: 8 % (ref 3–12)
Neutro Abs: 2.8 10*3/uL (ref 1.7–7.7)
Neutrophils Relative %: 42 % — ABNORMAL LOW (ref 43–77)

## 2010-12-25 LAB — COMPREHENSIVE METABOLIC PANEL
ALT: 92 U/L — ABNORMAL HIGH (ref 0–35)
ALT: 52 U/L — ABNORMAL HIGH (ref 0–35)
ALT: 52 U/L — ABNORMAL HIGH (ref 0–35)
AST: 41 U/L — ABNORMAL HIGH (ref 0–37)
AST: 14 U/L (ref 0–37)
AST: 41 U/L — ABNORMAL HIGH (ref 0–37)
Albumin: 3.4 g/dL — ABNORMAL LOW (ref 3.5–5.2)
Albumin: 4.1 g/dL (ref 3.5–5.2)
Alkaline Phosphatase: 49 U/L (ref 39–117)
Alkaline Phosphatase: 56 U/L (ref 39–117)
Alkaline Phosphatase: 67 U/L (ref 39–117)
BUN: 11 mg/dL (ref 6–23)
BUN: 1 mg/dL — ABNORMAL LOW (ref 6–23)
BUN: 5 mg/dL — ABNORMAL LOW (ref 6–23)
CO2: 26 mEq/L (ref 19–32)
CO2: 27 mEq/L (ref 19–32)
CO2: 21 mEq/L (ref 19–32)
CO2: 27 mEq/L (ref 19–32)
Calcium: 9.6 mg/dL (ref 8.4–10.5)
Calcium: 8.2 mg/dL — ABNORMAL LOW (ref 8.4–10.5)
Chloride: 103 mEq/L (ref 96–112)
Chloride: 107 mEq/L (ref 96–112)
Chloride: 108 mEq/L (ref 96–112)
Chloride: 113 mEq/L — ABNORMAL HIGH (ref 96–112)
Creatinine, Ser: 0.56 mg/dL (ref 0.4–1.2)
Creatinine, Ser: 0.7 mg/dL (ref 0.4–1.2)
Creatinine, Ser: 0.59 mg/dL (ref 0.4–1.2)
Creatinine, Ser: 0.7 mg/dL (ref 0.4–1.2)
GFR calc Af Amer: >60 mL/min (ref >60)
GFR calc Af Amer: >60 mL/min (ref >60)
GFR calc Af Amer: >60 mL/min (ref >60)
GFR calc non Af Amer: >60 mL/min (ref >60)
GFR calc non Af Amer: >60 mL/min (ref >60)
GFR calc non Af Amer: >60 mL/min (ref >60)
GFR calc non Af Amer: >60 mL/min (ref >60)
Glucose, Bld: 128 mg/dL — ABNORMAL HIGH (ref 70–99)
Glucose, Bld: 119 mg/dL — ABNORMAL HIGH (ref 70–99)
Glucose, Bld: 119 mg/dL — ABNORMAL HIGH (ref 70–99)
Potassium: 3.1 mEq/L — ABNORMAL LOW (ref 3.5–5.1)
Potassium: 4.3 mEq/L (ref 3.5–5.1)
Sodium: 138 mEq/L (ref 135–145)
Sodium: 140 mEq/L (ref 135–145)
Sodium: 140 mEq/L (ref 135–145)
Total Bilirubin: 0.5 mg/dL (ref 0.3–1.2)
Total Bilirubin: 0.5 mg/dL (ref 0.3–1.2)
Total Bilirubin: 0.7 mg/dL (ref 0.3–1.2)
Total Bilirubin: 0.8 mg/dL (ref 0.3–1.2)
Total Protein: 6.8 g/dL (ref 6.0–8.3)
Total Protein: 5.9 g/dL — ABNORMAL LOW (ref 6.0–8.3)
Total Protein: 7.4 g/dL (ref 6.0–8.3)

## 2010-12-25 LAB — URINALYSIS, ROUTINE W REFLEX MICROSCOPIC
Bilirubin Urine: NEGATIVE
Glucose, UA: NEGATIVE mg/dL
Glucose, UA: NEGATIVE mg/dL
Nitrite: POSITIVE — AB
Protein, ur: NEGATIVE mg/dL
Specific Gravity, Urine: >1.03 — ABNORMAL HIGH (ref 1.005–1.030)
pH: 5.5 (ref 5.0–8.0)

## 2010-12-25 LAB — CBC
HCT: 32.7 % — ABNORMAL LOW (ref 36.0–46.0)
HCT: 34.8 % — ABNORMAL LOW (ref 36.0–46.0)
Hemoglobin: 14.1 g/dL (ref 12.0–15.0)
Hemoglobin: 14.6 g/dL (ref 12.0–15.0)
Hemoglobin: 11.5 g/dL — ABNORMAL LOW (ref 12.0–15.0)
Hemoglobin: 14.9 g/dL (ref 12.0–15.0)
MCHC: 34.3 g/dL (ref 30.0–36.0)
MCHC: 34.4 g/dL (ref 30.0–36.0)
MCV: 94.8 fL (ref 78.0–100.0)
MCV: 93.6 fL (ref 78.0–100.0)
Platelets: 244 10*3/uL (ref 150–400)
Platelets: 217 10*3/uL (ref 150–400)
RBC: 3.72 MIL/uL — ABNORMAL LOW (ref 3.87–5.11)
RDW: 12.8 % (ref 11.5–15.5)
RDW: 12.8 % (ref 11.5–15.5)
RDW: 12.8 % (ref 11.5–15.5)
WBC: 5 10*3/uL (ref 4.0–10.5)
WBC: 7.2 10*3/uL (ref 4.0–10.5)

## 2010-12-25 LAB — CARDIAC PANEL(CRET KIN+CKTOT+MB+TROPI)
CK, MB: 0.8 ng/mL (ref 0.3–4.0)
CK, MB: 0.9 ng/mL (ref 0.3–4.0)
CK, MB: 0.9 ng/mL (ref 0.3–4.0)
CK, MB: 1 ng/mL (ref 0.3–4.0)
CK, MB: 1.1 ng/mL (ref 0.3–4.0)
Relative Index: 0.8 (ref 0.0–2.5)
Relative Index: INVALID (ref 0.0–2.5)
Relative Index: INVALID (ref 0.0–2.5)
Relative Index: INVALID (ref 0.0–2.5)
Relative Index: INVALID (ref 0.0–2.5)
Total CK: 109 U/L (ref 7–177)
Total CK: 73 U/L (ref 7–177)
Total CK: 76 U/L (ref 7–177)
Total CK: 80 U/L (ref 7–177)
Total CK: 85 U/L (ref 7–177)
Troponin I: 0.01 ng/mL (ref 0.00–0.06)
Troponin I: 0.01 ng/mL (ref 0.00–0.06)
Troponin I: 0.01 ng/mL (ref 0.00–0.06)
Troponin I: 0.02 ng/mL (ref 0.00–0.06)
Troponin I: <0.01 ng/mL (ref 0.00–0.06)

## 2010-12-25 LAB — BASIC METABOLIC PANEL
GFR calc non Af Amer: >60 mL/min (ref >60)
Potassium: 3.6 mEq/L (ref 3.5–5.1)
Sodium: 140 mEq/L (ref 135–145)

## 2010-12-25 LAB — URINE MICROSCOPIC-ADD ON

## 2010-12-25 LAB — URINE CULTURE
Colony Count: NO GROWTH
Culture: NO GROWTH

## 2010-12-25 LAB — MAGNESIUM: Magnesium: 1.9 mg/dL (ref 1.5–2.5)

## 2010-12-25 LAB — LIPASE, BLOOD
Lipase: 24 U/L (ref 11–59)
Lipase: 26 U/L (ref 11–59)
Lipase: 22 U/L (ref 11–59)

## 2011-01-10 LAB — CBC
MCHC: 33.8 g/dL (ref 30.0–36.0)
RBC: 4.5 MIL/uL (ref 3.87–5.11)

## 2011-01-10 LAB — DIFFERENTIAL
Eosinophils Absolute: 0 10*3/uL (ref 0.0–0.7)
Eosinophils Relative: 1 % (ref 0–5)
Lymphs Abs: 2.5 10*3/uL (ref 0.7–4.0)
Monocytes Absolute: 0.4 10*3/uL (ref 0.1–1.0)
Monocytes Relative: 4 % (ref 3–12)

## 2011-01-10 LAB — COMPREHENSIVE METABOLIC PANEL
ALT: 26 U/L (ref 0–35)
AST: 21 U/L (ref 0–37)
Albumin: 4.2 g/dL (ref 3.5–5.2)
CO2: 22 mEq/L (ref 19–32)
Calcium: 10 mg/dL (ref 8.4–10.5)
GFR calc Af Amer: >60 mL/min (ref >60)
GFR calc non Af Amer: >60 mL/min (ref >60)
Sodium: 139 mEq/L (ref 135–145)
Total Protein: 7.1 g/dL (ref 6.0–8.3)

## 2011-01-10 LAB — URINALYSIS, ROUTINE W REFLEX MICROSCOPIC
Bilirubin Urine: NEGATIVE
Glucose, UA: NEGATIVE mg/dL
Hgb urine dipstick: NEGATIVE
Ketones, ur: NEGATIVE mg/dL
Protein, ur: NEGATIVE mg/dL

## 2011-01-11 LAB — DIFFERENTIAL
Basophils Absolute: 0.1 10*3/uL (ref 0.0–0.1)
Basophils Absolute: 0 10*3/uL (ref 0.0–0.1)
Basophils Absolute: 0.1 10*3/uL (ref 0.0–0.1)
Basophils Relative: 0 % (ref 0–1)
Basophils Relative: 0 % (ref 0–1)
Basophils Relative: 0 % (ref 0–1)
Basophils Relative: 0 % (ref 0–1)
Basophils Relative: 0 % (ref 0–1)
Eosinophils Absolute: 0.1 10*3/uL (ref 0.0–0.7)
Eosinophils Absolute: 0.1 10*3/uL (ref 0.0–0.7)
Eosinophils Absolute: 0.1 10*3/uL (ref 0.0–0.7)
Eosinophils Absolute: 0.1 10*3/uL (ref 0.0–0.7)
Eosinophils Absolute: 0.1 10*3/uL (ref 0.0–0.7)
Eosinophils Absolute: 0 10*3/uL (ref 0.0–0.7)
Eosinophils Absolute: 0.1 10*3/uL (ref 0.0–0.7)
Eosinophils Relative: 0 % (ref 0–5)
Eosinophils Relative: 1 % (ref 0–5)
Eosinophils Relative: 1 % (ref 0–5)
Eosinophils Relative: 1 % (ref 0–5)
Eosinophils Relative: 1 % (ref 0–5)
Eosinophils Relative: 1 % (ref 0–5)
Eosinophils Relative: 0 % (ref 0–5)
Eosinophils Relative: 1 % (ref 0–5)
Lymphocytes Relative: 11 % — ABNORMAL LOW (ref 12–46)
Lymphocytes Relative: 13 % (ref 12–46)
Lymphocytes Relative: 17 % (ref 12–46)
Lymphocytes Relative: 20 % (ref 12–46)
Lymphocytes Relative: 26 % (ref 12–46)
Lymphocytes Relative: 20 % (ref 12–46)
Lymphocytes Relative: 7 % — ABNORMAL LOW (ref 12–46)
Lymphs Abs: 1.2 10*3/uL (ref 0.7–4.0)
Lymphs Abs: 1.5 10*3/uL (ref 0.7–4.0)
Lymphs Abs: 1.6 10*3/uL (ref 0.7–4.0)
Lymphs Abs: 1.7 10*3/uL (ref 0.7–4.0)
Lymphs Abs: 1.9 10*3/uL (ref 0.7–4.0)
Lymphs Abs: 2.3 10*3/uL (ref 0.7–4.0)
Lymphs Abs: 1.2 10*3/uL (ref 0.7–4.0)
Lymphs Abs: 4.7 10*3/uL — ABNORMAL HIGH (ref 0.7–4.0)
Monocytes Absolute: 0.6 10*3/uL (ref 0.1–1.0)
Monocytes Absolute: 0.7 10*3/uL (ref 0.1–1.0)
Monocytes Absolute: 0.7 10*3/uL (ref 0.1–1.0)
Monocytes Absolute: 0.7 10*3/uL (ref 0.1–1.0)
Monocytes Absolute: 0.4 10*3/uL (ref 0.1–1.0)
Monocytes Absolute: 0.5 10*3/uL (ref 0.1–1.0)
Monocytes Absolute: 1.2 10*3/uL — ABNORMAL HIGH (ref 0.1–1.0)
Monocytes Relative: 3 % (ref 3–12)
Monocytes Relative: 5 % (ref 3–12)
Monocytes Relative: 8 % (ref 3–12)
Monocytes Relative: 8 % (ref 3–12)
Monocytes Relative: 2 % — ABNORMAL LOW (ref 3–12)
Monocytes Relative: 5 % (ref 3–12)
Monocytes Relative: 7 % (ref 3–12)
Neutro Abs: 10.1 10*3/uL — ABNORMAL HIGH (ref 1.7–7.7)
Neutro Abs: 5.6 10*3/uL (ref 1.7–7.7)
Neutro Abs: 15.5 10*3/uL — ABNORMAL HIGH (ref 1.7–7.7)
Neutro Abs: 17.6 10*3/uL — ABNORMAL HIGH (ref 1.7–7.7)
Neutro Abs: 4.1 10*3/uL (ref 1.7–7.7)
Neutrophils Relative %: 65 % (ref 43–77)
Neutrophils Relative %: 87 % — ABNORMAL HIGH (ref 43–77)
Neutrophils Relative %: 75 % (ref 43–77)
Neutrophils Relative %: 91 % — ABNORMAL HIGH (ref 43–77)

## 2011-01-11 LAB — HEPATIC FUNCTION PANEL
ALT: 23 U/L (ref 0–35)
ALT: 36 U/L — ABNORMAL HIGH (ref 0–35)
AST: 20 U/L (ref 0–37)
Alkaline Phosphatase: 65 U/L (ref 39–117)
Alkaline Phosphatase: 58 U/L (ref 39–117)
Bilirubin, Direct: 0.1 mg/dL (ref 0.0–0.3)
Indirect Bilirubin: 0.1 mg/dL — ABNORMAL LOW (ref 0.3–0.9)
Total Protein: 5.2 g/dL — ABNORMAL LOW (ref 6.0–8.3)

## 2011-01-11 LAB — COMPREHENSIVE METABOLIC PANEL WITH GFR
ALT: 40 U/L — ABNORMAL HIGH (ref 0–35)
AST: 29 U/L (ref 0–37)
Albumin: 3.9 g/dL (ref 3.5–5.2)
Alkaline Phosphatase: 55 U/L (ref 39–117)
BUN: 6 mg/dL (ref 6–23)
CO2: 23 meq/L (ref 19–32)
Calcium: 9 mg/dL (ref 8.4–10.5)
Chloride: 108 meq/L (ref 96–112)
Creatinine, Ser: 0.83 mg/dL (ref 0.4–1.2)
GFR calc non Af Amer: >60 mL/min
Glucose, Bld: 130 mg/dL — ABNORMAL HIGH (ref 70–99)
Potassium: 3.7 meq/L (ref 3.5–5.1)
Sodium: 138 meq/L (ref 135–145)
Total Bilirubin: 0.6 mg/dL (ref 0.3–1.2)
Total Protein: 6.7 g/dL (ref 6.0–8.3)

## 2011-01-11 LAB — CULTURE, BLOOD (ROUTINE X 2)
Culture: NO GROWTH
Report Status: 10242010

## 2011-01-11 LAB — CBC
HCT: 32.4 % — ABNORMAL LOW (ref 36.0–46.0)
HCT: 33.5 % — ABNORMAL LOW (ref 36.0–46.0)
HCT: 39.5 % (ref 36.0–46.0)
HCT: 39.9 % (ref 36.0–46.0)
HCT: 47.1 % — ABNORMAL HIGH (ref 36.0–46.0)
Hemoglobin: 10.6 g/dL — ABNORMAL LOW (ref 12.0–15.0)
Hemoglobin: 11.2 g/dL — ABNORMAL LOW (ref 12.0–15.0)
Hemoglobin: 11.4 g/dL — ABNORMAL LOW (ref 12.0–15.0)
Hemoglobin: 14.9 g/dL (ref 12.0–15.0)
Hemoglobin: 16.2 g/dL — ABNORMAL HIGH (ref 12.0–15.0)
MCHC: 35 g/dL (ref 30.0–36.0)
MCHC: 34.3 g/dL (ref 30.0–36.0)
MCHC: 34.4 g/dL (ref 30.0–36.0)
MCV: 96.5 fL (ref 78.0–100.0)
MCV: 96.6 fL (ref 78.0–100.0)
MCV: 96.9 fL (ref 78.0–100.0)
MCV: 97.2 fL (ref 78.0–100.0)
MCV: 96.5 fL (ref 78.0–100.0)
MCV: 96.9 fL (ref 78.0–100.0)
Platelets: 196 10*3/uL (ref 150–400)
Platelets: 202 10*3/uL (ref 150–400)
Platelets: 215 10*3/uL (ref 150–400)
Platelets: 239 10*3/uL (ref 150–400)
Platelets: 244 10*3/uL (ref 150–400)
Platelets: 276 10*3/uL (ref 150–400)
RBC: 3.2 MIL/uL — ABNORMAL LOW (ref 3.87–5.11)
RBC: 3.22 MIL/uL — ABNORMAL LOW (ref 3.87–5.11)
RBC: 3.47 MIL/uL — ABNORMAL LOW (ref 3.87–5.11)
RBC: 3.9 MIL/uL (ref 3.87–5.11)
RBC: 4.88 MIL/uL (ref 3.87–5.11)
RDW: 12.8 % (ref 11.5–15.5)
RDW: 12.9 % (ref 11.5–15.5)
RDW: 12.1 % (ref 11.5–15.5)
RDW: 12.2 % (ref 11.5–15.5)
WBC: 11.1 10*3/uL — ABNORMAL HIGH (ref 4.0–10.5)
WBC: 12.5 10*3/uL — ABNORMAL HIGH (ref 4.0–10.5)
WBC: 8.6 10*3/uL (ref 4.0–10.5)
WBC: 8.9 10*3/uL (ref 4.0–10.5)
WBC: 9.3 10*3/uL (ref 4.0–10.5)
WBC: 9.7 10*3/uL (ref 4.0–10.5)

## 2011-01-11 LAB — COMPREHENSIVE METABOLIC PANEL
ALT: 15 U/L (ref 0–35)
ALT: 17 U/L (ref 0–35)
AST: 14 U/L (ref 0–37)
AST: 15 U/L (ref 0–37)
AST: 16 U/L (ref 0–37)
Albumin: 2.6 g/dL — ABNORMAL LOW (ref 3.5–5.2)
Albumin: 2.9 g/dL — ABNORMAL LOW (ref 3.5–5.2)
Alkaline Phosphatase: 64 U/L (ref 39–117)
BUN: 5 mg/dL — ABNORMAL LOW (ref 6–23)
CO2: 26 mEq/L (ref 19–32)
CO2: 29 mEq/L (ref 19–32)
CO2: 20 mEq/L (ref 19–32)
Calcium: 8.2 mg/dL — ABNORMAL LOW (ref 8.4–10.5)
Calcium: 8.4 mg/dL (ref 8.4–10.5)
Calcium: 8.4 mg/dL (ref 8.4–10.5)
Chloride: 102 mEq/L (ref 96–112)
Chloride: 104 mEq/L (ref 96–112)
Chloride: 108 mEq/L (ref 96–112)
Creatinine, Ser: 0.53 mg/dL (ref 0.4–1.2)
Creatinine, Ser: 0.65 mg/dL (ref 0.4–1.2)
GFR calc Af Amer: >60 mL/min (ref >60)
GFR calc Af Amer: >60 mL/min (ref >60)
GFR calc Af Amer: >60 mL/min (ref >60)
GFR calc non Af Amer: >60 mL/min (ref >60)
GFR calc non Af Amer: >60 mL/min (ref >60)
GFR calc non Af Amer: >60 mL/min (ref >60)
Glucose, Bld: 144 mg/dL — ABNORMAL HIGH (ref 70–99)
Potassium: 3.5 mEq/L (ref 3.5–5.1)
Sodium: 135 mEq/L (ref 135–145)
Sodium: 137 mEq/L (ref 135–145)
Sodium: 136 mEq/L (ref 135–145)
Total Bilirubin: 0.4 mg/dL (ref 0.3–1.2)
Total Bilirubin: 0.6 mg/dL (ref 0.3–1.2)
Total Bilirubin: 0.9 mg/dL (ref 0.3–1.2)
Total Protein: 6 g/dL (ref 6.0–8.3)
Total Protein: 6.1 g/dL (ref 6.0–8.3)

## 2011-01-11 LAB — BASIC METABOLIC PANEL
BUN: 2 mg/dL — ABNORMAL LOW (ref 6–23)
BUN: 2 mg/dL — ABNORMAL LOW (ref 6–23)
CO2: 24 mEq/L (ref 19–32)
Calcium: 9.1 mg/dL (ref 8.4–10.5)
Chloride: 101 mEq/L (ref 96–112)
Chloride: 103 mEq/L (ref 96–112)
Chloride: 105 mEq/L (ref 96–112)
Creatinine, Ser: 0.54 mg/dL (ref 0.4–1.2)
Creatinine, Ser: 0.64 mg/dL (ref 0.4–1.2)
GFR calc Af Amer: >60 mL/min (ref >60)
GFR calc Af Amer: >60 mL/min (ref >60)
GFR calc non Af Amer: >60 mL/min (ref >60)
GFR calc non Af Amer: >60 mL/min (ref >60)
Glucose, Bld: 126 mg/dL — ABNORMAL HIGH (ref 70–99)
Glucose, Bld: 139 mg/dL — ABNORMAL HIGH (ref 70–99)
Potassium: 3.1 mEq/L — ABNORMAL LOW (ref 3.5–5.1)
Potassium: 3.9 mEq/L (ref 3.5–5.1)
Sodium: 135 mEq/L (ref 135–145)
Sodium: 137 mEq/L (ref 135–145)
Sodium: 138 mEq/L (ref 135–145)

## 2011-01-11 LAB — URINALYSIS, ROUTINE W REFLEX MICROSCOPIC
Bilirubin Urine: NEGATIVE
Bilirubin Urine: NEGATIVE
Glucose, UA: NEGATIVE mg/dL
Glucose, UA: 100 mg/dL — AB
Hgb urine dipstick: NEGATIVE
Ketones, ur: NEGATIVE mg/dL
Nitrite: NEGATIVE
Protein, ur: NEGATIVE mg/dL
Specific Gravity, Urine: <1.005 — ABNORMAL LOW (ref 1.005–1.030)
Specific Gravity, Urine: 1.02 (ref 1.005–1.030)
Urobilinogen, UA: 0.2 mg/dL (ref 0.0–1.0)
pH: 7.5 (ref 5.0–8.0)
pH: 6 (ref 5.0–8.0)

## 2011-01-11 LAB — BASIC METABOLIC PANEL WITH GFR
BUN: 8 mg/dL (ref 6–23)
BUN: 9 mg/dL (ref 6–23)
CO2: 19 meq/L (ref 19–32)
CO2: 22 meq/L (ref 19–32)
Calcium: 9 mg/dL (ref 8.4–10.5)
Calcium: 9.4 mg/dL (ref 8.4–10.5)
Chloride: 110 meq/L (ref 96–112)
Chloride: 111 meq/L (ref 96–112)
Creatinine, Ser: 0.79 mg/dL (ref 0.4–1.2)
Creatinine, Ser: 0.87 mg/dL (ref 0.4–1.2)
GFR calc non Af Amer: >60 mL/min
GFR calc non Af Amer: >60 mL/min
Glucose, Bld: 159 mg/dL — ABNORMAL HIGH (ref 70–99)
Glucose, Bld: 219 mg/dL — ABNORMAL HIGH (ref 70–99)
Potassium: 3 meq/L — ABNORMAL LOW (ref 3.5–5.1)
Potassium: 4.9 meq/L (ref 3.5–5.1)
Sodium: 137 meq/L (ref 135–145)
Sodium: 138 meq/L (ref 135–145)

## 2011-01-11 LAB — GLUCOSE, CAPILLARY
Glucose-Capillary: 132 mg/dL — ABNORMAL HIGH (ref 70–99)
Glucose-Capillary: 142 mg/dL — ABNORMAL HIGH (ref 70–99)
Glucose-Capillary: 158 mg/dL — ABNORMAL HIGH (ref 70–99)
Glucose-Capillary: 170 mg/dL — ABNORMAL HIGH (ref 70–99)
Glucose-Capillary: 173 mg/dL — ABNORMAL HIGH (ref 70–99)
Glucose-Capillary: 180 mg/dL — ABNORMAL HIGH (ref 70–99)
Glucose-Capillary: 183 mg/dL — ABNORMAL HIGH (ref 70–99)
Glucose-Capillary: 191 mg/dL — ABNORMAL HIGH (ref 70–99)
Glucose-Capillary: 191 mg/dL — ABNORMAL HIGH (ref 70–99)
Glucose-Capillary: 193 mg/dL — ABNORMAL HIGH (ref 70–99)

## 2011-01-11 LAB — URINE MICROSCOPIC-ADD ON

## 2011-01-11 LAB — URINE CULTURE

## 2011-01-11 LAB — MAGNESIUM
Magnesium: 2 mg/dL (ref 1.5–2.5)
Magnesium: 1.7 mg/dL (ref 1.5–2.5)

## 2011-01-11 LAB — LIPASE, BLOOD
Lipase: 36 U/L (ref 11–59)
Lipase: 93 U/L — ABNORMAL HIGH (ref 11–59)
Lipase: 46 U/L (ref 11–59)
Lipase: >2000 U/L — ABNORMAL HIGH (ref 11–59)

## 2011-01-11 LAB — PHOSPHORUS: Phosphorus: 2.9 mg/dL (ref 2.3–4.6)

## 2011-01-11 LAB — PREALBUMIN: Prealbumin: 4.7 mg/dL — ABNORMAL LOW (ref 18.0–45.0)

## 2011-01-11 LAB — TRIGLYCERIDES: Triglycerides: 149 mg/dL (ref <150)

## 2011-01-11 LAB — PREGNANCY, URINE: Preg Test, Ur: NEGATIVE

## 2011-01-13 LAB — HEPATIC FUNCTION PANEL
ALT: 50 U/L — ABNORMAL HIGH (ref 0–35)
AST: 26 U/L (ref 0–37)
Albumin: 3.1 g/dL — ABNORMAL LOW (ref 3.5–5.2)
Albumin: 3.1 g/dL — ABNORMAL LOW (ref 3.5–5.2)
Alkaline Phosphatase: 48 U/L (ref 39–117)
Indirect Bilirubin: 0.3 mg/dL (ref 0.3–0.9)
Total Protein: 5.5 g/dL — ABNORMAL LOW (ref 6.0–8.3)
Total Protein: 5.7 g/dL — ABNORMAL LOW (ref 6.0–8.3)

## 2011-01-13 LAB — DIFFERENTIAL
Basophils Absolute: 0 10*3/uL (ref 0.0–0.1)
Basophils Relative: 0 % (ref 0–1)
Eosinophils Absolute: 0.1 10*3/uL (ref 0.0–0.7)
Eosinophils Absolute: 0.1 10*3/uL (ref 0.0–0.7)
Eosinophils Relative: 2 % (ref 0–5)
Eosinophils Relative: 2 % (ref 0–5)
Lymphs Abs: 1.9 10*3/uL (ref 0.7–4.0)
Lymphs Abs: 2.4 10*3/uL (ref 0.7–4.0)
Monocytes Absolute: 0.3 10*3/uL (ref 0.1–1.0)
Neutrophils Relative %: 43 % (ref 43–77)

## 2011-01-13 LAB — CBC
HCT: 32.3 % — ABNORMAL LOW (ref 36.0–46.0)
Hemoglobin: 11.3 g/dL — ABNORMAL LOW (ref 12.0–15.0)
MCHC: 34.8 g/dL (ref 30.0–36.0)
MCV: 97.3 fL (ref 78.0–100.0)
MCV: 97.6 fL (ref 78.0–100.0)
Platelets: 187 10*3/uL (ref 150–400)
Platelets: 188 10*3/uL (ref 150–400)
RDW: 12.8 % (ref 11.5–15.5)
RDW: 13 % (ref 11.5–15.5)
WBC: 3.8 10*3/uL — ABNORMAL LOW (ref 4.0–10.5)

## 2011-01-13 LAB — URINALYSIS, ROUTINE W REFLEX MICROSCOPIC
Hgb urine dipstick: NEGATIVE
Ketones, ur: NEGATIVE mg/dL
Protein, ur: NEGATIVE mg/dL
Urobilinogen, UA: 1 mg/dL (ref 0.0–1.0)

## 2011-01-13 LAB — BASIC METABOLIC PANEL
BUN: 1 mg/dL — ABNORMAL LOW (ref 6–23)
BUN: 10 mg/dL (ref 6–23)
BUN: 3 mg/dL — ABNORMAL LOW (ref 6–23)
CO2: 22 mEq/L (ref 19–32)
Calcium: 9 mg/dL (ref 8.4–10.5)
Chloride: 111 mEq/L (ref 96–112)
Chloride: 111 mEq/L (ref 96–112)
Creatinine, Ser: 0.76 mg/dL (ref 0.4–1.2)
Creatinine, Ser: 1.12 mg/dL (ref 0.4–1.2)
Glucose, Bld: 114 mg/dL — ABNORMAL HIGH (ref 70–99)
Glucose, Bld: 126 mg/dL — ABNORMAL HIGH (ref 70–99)
Glucose, Bld: 145 mg/dL — ABNORMAL HIGH (ref 70–99)
Potassium: 3.4 mEq/L — ABNORMAL LOW (ref 3.5–5.1)

## 2011-01-13 LAB — LIPASE, BLOOD: Lipase: 26 U/L (ref 11–59)

## 2011-01-16 LAB — CBC
HCT: 35.9 % — ABNORMAL LOW (ref 36.0–46.0)
HCT: 46.2 % — ABNORMAL HIGH (ref 36.0–46.0)
Hemoglobin: 16.3 g/dL — ABNORMAL HIGH (ref 12.0–15.0)
MCHC: 35.4 g/dL (ref 30.0–36.0)
MCHC: 35.9 g/dL (ref 30.0–36.0)
MCV: 97.1 fL (ref 78.0–100.0)
MCV: 98.1 fL (ref 78.0–100.0)
Platelets: 227 10*3/uL (ref 150–400)
Platelets: 235 10*3/uL (ref 150–400)
Platelets: 298 10*3/uL (ref 150–400)
RDW: 13.2 % (ref 11.5–15.5)
RDW: 13.4 % (ref 11.5–15.5)
WBC: 3.8 10*3/uL — ABNORMAL LOW (ref 4.0–10.5)

## 2011-01-16 LAB — DIFFERENTIAL
Basophils Absolute: 0 10*3/uL (ref 0.0–0.1)
Basophils Absolute: 0 10*3/uL (ref 0.0–0.1)
Basophils Absolute: 0 10*3/uL (ref 0.0–0.1)
Basophils Relative: 0 % (ref 0–1)
Eosinophils Absolute: 0 10*3/uL (ref 0.0–0.7)
Eosinophils Relative: 0 % (ref 0–5)
Lymphocytes Relative: 21 % (ref 12–46)
Lymphocytes Relative: 47 % — ABNORMAL HIGH (ref 12–46)
Lymphocytes Relative: 59 % — ABNORMAL HIGH (ref 12–46)
Lymphs Abs: 2.3 10*3/uL (ref 0.7–4.0)
Monocytes Absolute: 0.3 10*3/uL (ref 0.1–1.0)
Monocytes Absolute: 0.3 10*3/uL (ref 0.1–1.0)
Monocytes Absolute: 0.5 10*3/uL (ref 0.1–1.0)
Neutro Abs: 1.2 10*3/uL — ABNORMAL LOW (ref 1.7–7.7)
Neutro Abs: 3.2 10*3/uL (ref 1.7–7.7)
Neutrophils Relative %: 31 % — ABNORMAL LOW (ref 43–77)
Neutrophils Relative %: 45 % (ref 43–77)

## 2011-01-16 LAB — BASIC METABOLIC PANEL
BUN: 10 mg/dL (ref 6–23)
BUN: 5 mg/dL — ABNORMAL LOW (ref 6–23)
BUN: <1 mg/dL — ABNORMAL LOW (ref 6–23)
CO2: 30 mEq/L (ref 19–32)
CO2: 33 mEq/L — ABNORMAL HIGH (ref 19–32)
Calcium: 8.2 mg/dL — ABNORMAL LOW (ref 8.4–10.5)
Chloride: 105 mEq/L (ref 96–112)
Chloride: 96 mEq/L (ref 96–112)
Creatinine, Ser: 0.62 mg/dL (ref 0.4–1.2)
Creatinine, Ser: 0.7 mg/dL (ref 0.4–1.2)
GFR calc non Af Amer: 58 mL/min — ABNORMAL LOW (ref >60)
GFR calc non Af Amer: >60 mL/min (ref >60)
Glucose, Bld: 153 mg/dL — ABNORMAL HIGH (ref 70–99)
Glucose, Bld: 85 mg/dL (ref 70–99)
Potassium: 4.3 mEq/L (ref 3.5–5.1)
Potassium: 4.5 mEq/L (ref 3.5–5.1)
Sodium: 135 mEq/L (ref 135–145)

## 2011-01-16 LAB — HEPATIC FUNCTION PANEL
ALT: 27 U/L (ref 0–35)
Alkaline Phosphatase: 55 U/L (ref 39–117)
Bilirubin, Direct: 0.1 mg/dL (ref 0.0–0.3)
Indirect Bilirubin: 0.4 mg/dL (ref 0.3–0.9)
Total Protein: 7.8 g/dL (ref 6.0–8.3)

## 2011-01-16 LAB — LIPASE, BLOOD
Lipase: 17 U/L (ref 11–59)
Lipase: 20 U/L (ref 11–59)

## 2011-01-18 LAB — BASIC METABOLIC PANEL
CO2: 24 mEq/L (ref 19–32)
Calcium: 9.3 mg/dL (ref 8.4–10.5)
GFR calc Af Amer: >60 mL/min (ref >60)
GFR calc non Af Amer: >60 mL/min (ref >60)
Sodium: 135 mEq/L (ref 135–145)

## 2011-01-18 LAB — DIFFERENTIAL
Lymphocytes Relative: 40 % (ref 12–46)
Lymphs Abs: 2.7 10*3/uL (ref 0.7–4.0)
Monocytes Absolute: 0.3 10*3/uL (ref 0.1–1.0)
Monocytes Relative: 5 % (ref 3–12)
Neutro Abs: 3.6 10*3/uL (ref 1.7–7.7)

## 2011-01-18 LAB — HEPATIC FUNCTION PANEL
ALT: 20 U/L (ref 0–35)
AST: 21 U/L (ref 0–37)
Alkaline Phosphatase: 48 U/L (ref 39–117)
Bilirubin, Direct: 0.1 mg/dL (ref 0.0–0.3)
Total Bilirubin: 0.3 mg/dL (ref 0.3–1.2)

## 2011-01-18 LAB — CBC
Hemoglobin: 14.7 g/dL (ref 12.0–15.0)
MCHC: 35 g/dL (ref 30.0–36.0)
RBC: 4.42 MIL/uL (ref 3.87–5.11)
WBC: 6.6 10*3/uL (ref 4.0–10.5)

## 2011-01-21 ENCOUNTER — Emergency Department (HOSPITAL_COMMUNITY)
Admission: EM | Admit: 2011-01-21 | Discharge: 2011-01-21 | Disposition: A | Discharge status: Still In Hospital | Payer: Self-pay | Attending: Emergency Medicine | Admitting: Emergency Medicine

## 2011-01-21 ENCOUNTER — Emergency Department (HOSPITAL_COMMUNITY): Payer: Self-pay

## 2011-01-21 DIAGNOSIS — K859 Acute pancreatitis without necrosis or infection, unspecified: Secondary | ICD-10-CM | POA: Insufficient documentation

## 2011-01-21 DIAGNOSIS — R109 Unspecified abdominal pain: Secondary | ICD-10-CM | POA: Insufficient documentation

## 2011-01-21 LAB — COMPREHENSIVE METABOLIC PANEL
ALT: 61 U/L — ABNORMAL HIGH (ref 0–35)
AST: 51 U/L — ABNORMAL HIGH (ref 0–37)
Albumin: 3.3 g/dL — ABNORMAL LOW (ref 3.5–5.2)
Alkaline Phosphatase: 115 U/L (ref 39–117)
CO2: 23 mEq/L (ref 19–32)
Chloride: 100 mEq/L (ref 96–112)
Creatinine, Ser: 0.59 mg/dL (ref 0.4–1.2)
GFR calc Af Amer: >60 mL/min (ref >60)
GFR calc non Af Amer: >60 mL/min (ref >60)
Potassium: 3.6 mEq/L (ref 3.5–5.1)
Total Bilirubin: 0.7 mg/dL (ref 0.3–1.2)

## 2011-01-21 LAB — CBC
Hemoglobin: 11.3 g/dL — ABNORMAL LOW (ref 12.0–15.0)
MCH: 24.8 pg — ABNORMAL LOW (ref 26.0–34.0)
Platelets: 173 10*3/uL (ref 150–400)
RBC: 4.55 MIL/uL (ref 3.87–5.11)
WBC: 6 10*3/uL (ref 4.0–10.5)

## 2011-01-21 LAB — URINALYSIS, ROUTINE W REFLEX MICROSCOPIC
Glucose, UA: NEGATIVE mg/dL
Hgb urine dipstick: NEGATIVE
Specific Gravity, Urine: >1.03 — ABNORMAL HIGH (ref 1.005–1.030)
Urobilinogen, UA: 0.2 mg/dL (ref 0.0–1.0)

## 2011-01-21 LAB — DIFFERENTIAL
Basophils Relative: 0 % (ref 0–1)
Lymphs Abs: 1.6 10*3/uL (ref 0.7–4.0)
Monocytes Relative: 6 % (ref 3–12)
Neutro Abs: 4 10*3/uL (ref 1.7–7.7)
Neutrophils Relative %: 67 % (ref 43–77)

## 2011-02-20 NOTE — Op Note (Signed)
NAME:  [name] [name], [name] [name]                ACCOUNT NO.:  [id number]   MEDICAL RECORD NO.:  [id number]          PATIENT TYPE:  INP   LOCATION:  A334                          FACILITY:  [facility]   PHYSICIAN:  R. Roetta Sessions, M.D. DATE OF BIRTH:  11/01/1970   DATE OF PROCEDURE:  12/31/2008  DATE OF DISCHARGE:                               OPERATIVE REPORT   PROCEDURE:  Diagnostic EGD.   INDICATIONS FOR PROCEDURE:  A patient with intermittent nausea,  vomiting, epigastric pain, low volume hematemesis.  Also complains of  melena and intermittent hematochezia.  No evidence of pancreatitis this  admission.  Hemoglobin has remained entirely normal over serial assays.  Hemoglobin this morning is 14.4.  She has been using naproxen and came  off her Protonix 1 month ago.  EGD is now being done to further evaluate  symptoms.  Risks, benefits, alternatives and limitations have been  reviewed.  Please see the documentation in the medical record.   PROCEDURE NOTE:  O2 saturation, blood pressure, pulse and respirations  monitored throughout the entirety of the procedure.  Conscious sedation  Versed 5 mg IV, Demerol 100 mg IV in divided doses, Phenergan 12.5 mg  dilute slow IV push to augment conscious sedation.  Cetacaine spray for  topical pharyngeal anesthesia.  Instrument Pentax video chip system.   FINDINGS:  Examination of the tubular esophagus revealed accentuated  underlying Z-line otherwise esophageal mucosa appeared normal.  EG  junction easily traversed.  Stomach:  Gastric cavity was empty,  insufflated well with air.  Thorough examination of the gastric mucosa  including retroflexion of the proximal stomach, esophagogastric junction  demonstrates diffuse submucosal petechiae.  There was no ulcer, erosion  or infiltrating process.  The patient had an intact Nissen  fundoplication.  The pylorus was patent, easily traversed.  Examination  of the bulb and second portion revealed no abnormalities.  Therapeutic/diagnostic maneuvers were performed.  Biopsies of the antrum  and body were taken for histologic study.  The patient tolerated the  procedure well and was reactive after endoscopy.   IMPRESSION:  Undulating Z-line otherwise normal esophagus and intact  Nissen fundoplication and diffuse submucosal petechiae likely NSAID  effect, otherwise unremarkable gastric mucosa status post biopsy.  Patent pylorus and normal D1, D2.   RECOMMENDATIONS:  Refrain from using nonsteroidal agents for now.  Resume proton pump inhibitor therapy in the way of Protonix 40 mg orally  daily.  Add Carafate 1 gram slurry q.i.d. times 5 days.  Review path  when it becomes available.  She will be brought back at a later date for  an outpatient colonoscopy to evaluate the hematochezia.  Hopefully she  will be able to go home later today.      Jonathon Bellows, M.D.  Electronically Signed     RMR/MEDQ  D:  12/31/2008  T:  12/31/2008  Job:  [id number]   cc:   Donna Bernard, M.D.  Fax: [phone]

## 2011-02-20 NOTE — H&P (Signed)
NAMEJOLIE, STROHECKER                ACCOUNT NO.:  [id number]   MEDICAL RECORD NO.:  [id number]          PATIENT TYPE:  INP   LOCATION:  A332                          FACILITY:  [facility]   PHYSICIAN:  Donna Bernard, M.D.DATE OF BIRTH:  1970/11/01   DATE OF ADMISSION:  06/03/2009  DATE OF DISCHARGE:  LH                              HISTORY & PHYSICAL   CHIEF COMPLAINT:  Abdominal pain.   SUBJECTIVE:  This patient is a 40 year old Caucasian female with a  history of bipolar disease, migraine headaches, depression and anxiety,  esophageal ulcers, kidney stone, glucose intolerance, intermittent  chronic pancreatitis, and reactive airways along with irritable bowel  syndrome, who presented to the office on the day of admission with  ongoing concerns that day represent the third time she had been seen in  the office and the fourth time she had encountered medical professions  after going to the emergency room a couple nights previous.  The patient  noted ongoing abdominal pain.  She had sense of bloating.  She was achy.  She was having intermittent vomiting.  She notes no high fever but notes  some low-grade fever.  The patient had noted some urinary symptoms that  have been progressing in recent weeks in terms of not being able to  completely urinate.  The patient was having severe headaches at times,  frontal in nature, throbbing, associated with nausea.  She was using  Phenergan 10 mg tablets p.o. as needed for pain along with Phenergan  suppositories as needed.  The patient arrived to the office on day of  admission with ongoing complaints as noted above.  She claimed  compliance with her current medications, which include Phenergan 25 mg  p.o. q.4-6 p.r.n. for nausea, Ambien 10 mg at bedtime p.r.n. for sleep,  oxycodone tablets p.r.n. recently 10 mg, Paxil CR 25 one daily, Klonopin  1 mg at bedtime, Lamictal 200 mg p.o. at bedtime.   PAST SURGERIES:  Remote C-section, remote  cholecystectomy, negative  colonoscopy in 2010, remote hysterectomy in 2007.   FAMILY HISTORY:  Positive for coronary artery disease.   ALLERGIES:  REGLAN and sensitivities to BUSPAR.   SOCIAL HISTORY:  The patient is married, 2 children.  No tobacco or  alcohol use.   REVIEW OF SYSTEMS:  Otherwise negative.   PHYSICAL EXAMINATION:  VITAL SIGNS:  BP 125/78.  GENERAL:  The patient in moderate distress, holding the abdomen.  HEENT: Mucous membranes slightly dry.  NECK:  Supple.  LUNGS:  Clear.  HEART:  Regular rate and rhythm.  ABDOMEN:  Diffuse mild-to-moderate tenderness.  No rebound.  No  guarding.   SIGNIFICANT LABORATORY DATA:  CBC, white blood count normal.  MET-7  normal, liver enzymes normal, amylase/lipase normal.  Abdominal x-ray  though shows either ileus or partial bowel obstruction.   IMPRESSION:  Persistent abdominal pain with signs on x-rays noted above  and clear failure on outpatient management.   PLAN:  IV fluids, IV pain, nausea therapy, GI consult, CT scan of  abdomen and pelvis, rationale discussed.      Enzo Bi  Gerda Diss, M.D.  Electronically Signed     WSL/MEDQ  D:  06/05/2009  T:  06/06/2009  Job:  [id number]

## 2011-02-20 NOTE — Consult Note (Signed)
NAME:  [name] [name], [name] [name]                ACCOUNT NO.:  [id number]   MEDICAL RECORD NO.:  [id number]          PATIENT TYPE:  INP   LOCATION:  A335                          FACILITY:  [facility]   PHYSICIAN:  R. Roetta Sessions, M.D. DATE OF BIRTH:  Jun 26, 1971   DATE OF CONSULTATION:  03/08/2009  DATE OF DISCHARGE:                                 CONSULTATION   REASON FOR CONSULTATION:  Abdominal pain.   PHYSICIAN REQUESTING CONSULTATION:  Donna Bernard, M.D.   HISTORY OF PRESENT ILLNESS:  The patient is a very pleasant 40 year old  Caucasian female well known to our practice from her prior history of  recurrent pancreatitis and chronic abdominal pain.  We last saw her  during hospitalization for hematemesis, epigastric pain and  hematochezia back on December 30, 2008.  At that time she underwent an EGD  and was found to have an undulating Z-line, intact Nissen  fundoplication, diffuse submucosal petechiae likely NSAID effect.  Her  biopsies were positive for H. pylori and she tells me she was treated by  Dr. Gerda Diss for H. pylori and did complete therapy.  The patient was  actually a no-show to our office back in April and when we called her  asking her to reschedule she told us that she had already been treated  and she did not want to come back for followup.  Unfortunately, we had  plans of pursuing an outpatient colonoscopy and this therefore has not  been carried out.   Patient had presented to see Dr. Gerda Diss on May 28 with complaints of  very severe abdominal pain in the epigastrium and radiating into the  back.  She was complaining of nausea.  She was also having severe  headache.  She wanted to be admitted.  She was managed by the  hospitalist service over the weekend and Dr. Gerda Diss requested [place] to see  her today.  On admission her white count was 11,000, currently 3,800.  Hemoglobin is normal, platelet count normal.  Her amylase and lipase  have been normal throughout the stay.  Her  LFTs are normal as well.  Today her BUN and creatinine are normal.  She has not had any imaging  study this hospitalization.   The patient tells me today her abdominal pain is somewhat better after  having a bowel movement with soap suds enema.  She states she was having  pain constantly for about a week and had not had a BM in over a week  prior to seeing Dr. Gerda Diss last Friday.  She tells me that today her  belly pain is occurring only after meals.  She tells me she is getting a  fairly high fat diet on her tray.  At home she tends to gravitate  towards a low fat diet because of less abdominal pain associated with.  She tells me that she has not had any recent pancreatic enzymes although  has been on them in the past.  She is not on any sort of bowel regimen  for chronic constipation.  She continues to have intermittent bright red  blood per rectum with BMs.  Denies melena.  No hematemesis.  No fever or  chills or dysuria or hematuria.   MEDICATIONS AT HOME:  1. Lamictal 200 mg nightly.  2. Klonopin 1 mg nightly.  3. Ambien 10 mg nightly p.r.n.  4. Gabapentin 400 mg nightly.  5. Omeprazole 20 mg daily.  6. Oxycodone 5 mg every 6 hours as needed.   ALLERGIES:  REGLAN.   PAST MEDICAL HISTORY:  1. Recurrent pancreatitis since cholecystectomy in 2001.  She has had      multiple admissions.  She has been evaluated several times at [facility] and course has been      complicated by recurrent pseudocyst formation.  She had an      endoscopic ultrasound, at least one ERCP with both external and      internal drainage of the pseudocyst there.  It was also felt that      she may have a kink versus a stricture of the pancreatic duct at      the junction between the head and the body, but it was not amenable      to stenting.  She was seen by Dr. Rockie Neighbours there but no      surgery was offered.  No history of prior alcohol use.  2. History of  bipolar disorder.  3. History of intentional overdose.  4. Gastroesophageal reflux disease status post laparoscopic Nissen      fundoplication.  5. H. pylori gastritis status post recent treatment.  6. Cholecystectomy.  7. Cesarean section.  8. Partial vaginal hysterectomy and cystoscopy in 2008.  9. Remote colonoscopy but we do not have these records.   FAMILY HISTORY:  Mother has fibromyalgia.  Father is diabetic.  Maternal  grandmother recently died due to cirrhosis.  No family history of colon  cancer.   SOCIAL HISTORY:  She is married.  She has two daughters.  She is on  disability.  No alcohol, drug or tobacco use.   REVIEW OF SYSTEMS:  See HPI for GI.  CONSTITUTIONAL:  She has had a 30  pound weight loss in the last one year.  She states it was not  intentional but has leveled off.  Would not go into any further details  previously.  CARDIOPULMONARY:  No chest pain, shortness of breath,  palpitations or cough.  GENITOURINARY:  No dysuria or hematuria.   PHYSICAL EXAMINATION:  64.7 kg which may be erroneous.  She was 76.6 kg  4 days ago on initial weight.  Temperature 98.3, T-max 99.3, pulse 87,  respirations 18, blood pressure 93/57.  O2 saturations 90% on room air.  GENERAL:  Pleasant well-nourished, well-developed Caucasian female in no  acute distress.  SKIN:  Warm and dry, no jaundice.  HEENT:  Sclerae nonicteric.  Oropharyngeal mucosa moist and pink.  CHEST:  Lungs are clear to auscultation.  CARDIAC EXAM:  Reveals regular rate and rhythm, no murmurs.  ABDOMEN:  Positive bowel sounds.  Abdomen is slightly obese but soft.  She has mild epigastric tenderness to deep palpation.  She has mild  tenderness throughout the lower abdomen as well.  No rebound or  guarding.  No organomegaly or masses.  No abdominal bruits or hernias.  LOWER EXTREMITIES:  No edema.   LABORATORIES:  As above.   IMPRESSION/PLAN:  1. Patient is a 40 year old Caucasian female with acute on  chronic  abdominal pain with recent H. pylori gastritis, history of possible      chronic pancreatitis based on calcification seen in the pancreas on      prior CTs, chronic constipation who presents with worsening of      abdominal pain.  At least some of her current abdominal pain has      been alleviated with bowel movement today.  She has a significant      history of constipation with no BM in over a week.  She has      intermittent bright red blood per rectum.  We have offered      outpatient colonoscopy previously but she failed to follow up to      have this scheduled.  We will need to do this in the short interval      followup after this hospitalization.  In addition she does have      history of recurrent pancreatitis as outlined above and likely      chronic pancreatitis, recent H. pylori gastritis which may be all      inadequately treated.  She needs to be on a low fat diet. We will      try adding pancreatic enzymes and treating her with a bowel      regimen, continue on proton pump inhibitor and as an outpatient      would recommend at least omeprazole 40 mg daily as opposed to      current dose of 20 mg daily.  2. If she does not progress in the next 24-48 hours would consider      reevaluating her with CT scan.  I would like to thank Dr. [name] for allowing [place] to take part in  the care of this patient.      Tana Coast, P.AJonathon Bellows, M.D.  Electronically Signed    LL/MEDQ  D:  03/08/2009  T:  03/08/2009  Job:  [id number]   cc:   Donna Bernard, M.D.  Fax: [phone]

## 2011-02-20 NOTE — H&P (Signed)
NAME:  [name] [name], [name] [name]                ACCOUNT NO.:  [id number]   MEDICAL RECORD NO.:  [id number]          PATIENT TYPE:  INP   LOCATION:  A334                          FACILITY:  [facility]   PHYSICIAN:  Donna Bernard, M.D.DATE OF BIRTH:  02-07-1971   DATE OF ADMISSION:  12/30/2008  DATE OF DISCHARGE:  LH                              HISTORY & PHYSICAL   CHIEF COMPLAINT:  Abdominal pain, vomiting blood.   SUBJECTIVE:  This patient is a 40 year old white female with history of  ulcers, recurrent pancreatitis, chronic depression, anxiety and bipolar  disease, who presented to the office on the day of admission with  complaints of abdominal pain and vomiting.  The patient states she has  had significant epigastric abdominal pain for the past 3 or 4 days.  She  did have an ankle injury several days ago that led her to take a fair  amount of Anaprox DS.  She stopped the Anaprox about a week ago.  Several days ago, the patient developed progressive epigastric  discomfort and pain.  She started to vomit intermittently.  Over the  last 2 days, she has been vomiting blood, describes it as moderately  bright red, also has been noticing black stools.  The patient states no  Pepto-Bismol use.  On further history, she states she has seen some  blood per rectum off and on for number of months.  No constipation.  No  change in bowel habits.  No chronic abdominal pain.   MEDICAL HISTORY:  Significant for chronic problems as noted above.   PRIOR SURGERIES:  Remote C-section, gallbladder surgery.   FAMILY HISTORY:  Positive for coronary artery disease and kidney stones.   ALLERGIES AND SENSITIVITIES:  REGLAN, BUSPAR, and DARVOCET.   SOCIAL HISTORY:  The patient is married, 2 children.  No alcohol abuse.  No tobacco abuse.   REVIEW OF SYSTEMS:  Otherwise negative.   VITAL SIGNS:  BP 120/86, afebrile, pulse 85.  GENERAL:  The patient is alert, moderately distress, holding her  abdomen.  HEENT:   Normal.  NECK:  Supple.  No lymphadenopathy.  LUNGS:  Clear.  HEART:  Regular rhythm.  ABDOMEN:  Epigastrium, distinct tenderness.  No rebound.  No guarding.  Hyperactive bowel sounds.  BACK:  No CVA tenderness.  EXTREMITIES:  Normal.  NEUROLOGIC:  Intact.   SIGNIFICANT LABORATORY DATA:  CBC; white blood count normal, hemoglobin  good.  Amylase and lipase within normal limits.  Liver enzymes good.   IMPRESSION:  1. Epigastric pain accompanied by vomiting and now hematemesis and      hematochezia.  2. History of pancreatitis.  3. Depression, anxiety, and bipolar disease.  4. History of reactive airways.  5. Remote history of ulcers.   PLAN:  Protonix 40 IV, GI consultation, IV fluids, IV nausea medicine,  p.o. Zofran, IV pain medicine, p.o. morphine.  Further orders as noted  in the chart.      Donna Bernard, M.D.  Electronically Signed     WSL/MEDQ  D:  12/30/2008  T:  12/31/2008  Job:  [id number]

## 2011-02-20 NOTE — H&P (Signed)
NAMEMALYNN, LUCY                ACCOUNT NO.:  [id number]   MEDICAL RECORD NO.:  [id number]          PATIENT TYPE:  INP   LOCATION:  A303                          FACILITY:  [facility]   PHYSICIAN:  Dorris Singh, DO    DATE OF BIRTH:  1971/05/10   DATE OF ADMISSION:  08/10/2007  DATE OF DISCHARGE:  LH                              HISTORY & PHYSICAL   The patient is a 40 year old female who presented to the emergency room  with abdominal pain and nausea and vomiting. While she was in the ED, it  was found that she had acute pancreatitis.  The patient states that this  is a chronic condition that she has had before and has been worked up  extensively [facility] at [facility] and has not found any reason for  it.  She has been diagnosed with idiopathic pancreatitis.   PAST MEDICAL HISTORY:  She is positive for bipolar disorder and history  of recurrent pancreatitis.   SURGICAL HISTORY:  1. Cesarean section.  2. Cholecystectomy.  3. Tubal ligation.  4. Hysterectomy without oophorectomy.   SOCIAL HISTORY:  She is a nonsmoker, nondrinker and no drug abuse.   ALLERGIES:  She has allergies to [facility] which makes her feel like her  neck and her jaw are tightening up.   She is on medications but do not have doses.  Have requested that her  family members bring the proper doses so we can put her on these  medications.  1. Ambien she takes as needed.  2. Klonopin she takes once a day.  3. Lithium carbonate once a day.  4. Neurontin once a day.  5. Oxycodone.  6. Acetaminophen as needed.   REVIEW OF SYSTEMS:  CONSTITUTIONAL:  She is positive for weakness.  EYES:  Negative for eye pain, diplopia or discharge.  ENT:  Negative for  ear pain, hearing loss, hoarseness, otorrhea, rhinorrhea, sinus  pressure, sore throat.  CARDIOVASCULAR:  Negative for chest pain,  claudication, bradycardia, complications.  RESPIRATORY:  Negative for  cough, dyspnea, wheezing or tachypnea.  GASTROINTESTINAL:   Positive  abdominal pain, nausea, vomiting.  GU:  Negative for dysuria, urgency,  dribbling, pain or frequency.  MUSCULOSKELETAL:  Negative for  arthralgias, arthritis, back pain, myalgia.  SKIN:  Negative for  pruritus, rash.  NEUROLOGIC:  Negative for headache, confusion, altered  mental status, lightheadedness.  METABOLIC:  Negative for excessive  thirst, cold intolerance.  HEMATOLOGIC:  Negative for anemia or bleeding  tendency, bruising.   PHYSICAL EXAMINATION:  VITAL SIGNS:  Blood pressure 140/91 with pulse  rate of 64, respirations of 16.  GENERAL:  This is a 40 year old female who is well nourished, well  developed, in no acute distress.  SKIN:  Good turgor, good texture.  No tattoos noted.  HEENT:  Her head is normocephalic, atraumatic.  Eyes are equal, reactive  to light bilaterally.  No scleral icterus or conjunctival discharge.  Nose: Turbinates moist.  No septal deviation.  Throat:  Dentition is in  good repair.  No erythema or exudate noted.  NECK:  Supple, nontender, no  swelling or masses noted.  HEART:  Regular rate and rhythm.  No gallops, murmurs or clicks noted.  CHEST:  Symmetrical respirations.  Clear to auscultation bilaterally.  No wheezes, rales or rhonchi.  ABDOMEN:  Soft, nontender, nondistended.  Positive tenderness mid  epigastrium but no rebound or rigidity noted.  EXTREMITIES:  Positive pulses. Dorsalis pedis pulse palpated on lower  extremities.  No ecchymosis, cyanosis or edema noted.   LABORATORY DATA:  Lipase of 632, amylase of 169.  BMP:  Sodium is 138,  potassium 3.7, chloride 105, carbon dioxide 28, glucose 169, BUN 4,  creatinine 0.72. Her white count is 5.9, hemoglobin 14.3, hematocrit of  41.5 and platelet count 260.  Her urine is negative   ASSESSMENT:  1. Acute pancreatitis.  2. Bipolar.  3. Elevated blood pressure.   PLAN:  Will admit the patient to inpatient 3A.  Will give IV fluids as  well as continue with pain management and keep  the patient n.p.o.  Place  the patient on glycemic protocol. Will also do DVT prophylaxis and GI  prophylaxis, and we will continue to monitor her with daily blood work  and will consult GI and continue to follow the patient and change  management as necessary.      Dorris Singh, DO  Electronically Signed     CB/MEDQ  D:  08/10/2007  T:  08/10/2007  Job:  [id number]

## 2011-02-20 NOTE — Consult Note (Signed)
NAME:  [name], [name]                ACCOUNT NO.:  [id number]   MEDICAL RECORD NO.:  [id number]          PATIENT TYPE:  INP   LOCATION:  A334                          FACILITY:  [facility]   PHYSICIAN:  R. Roetta Sessions, M.D. DATE OF BIRTH:  06/13/1971   DATE OF CONSULTATION:  12/30/2008  DATE OF DISCHARGE:                                 CONSULTATION   REASON FOR CONSULTATION:  Hematemesis, hematochezia, epigastric pain.   PHYSICIAN REQUESTING CONSULTATION:  Scott A. Luking, MD.   HISTORY OF PRESENT ILLNESS:  The patient is a pleasant 40 year old  Caucasian female with history of recurrent pancreatitis who we have seen  on multiple occasions in the past, who presents for direct admission  today because of history of hematemesis and epigastric pain as well as  hematochezia.  The patient tells me that for the past 2 months she has  had intermittent bright red blood per rectum.  She states at times it  just gushes out.  Over the last few weeks she has taken naproxen for  an ankle injury.  She has been taking 550 mg twice a day, stopped it  about a week ago and she started having abdominal pain.  She also had  been on Protonix up until about 1 month ago when Medicare stopped paying  for it.  She denies any other aspirin or NSAID use.  On Saturday she  started having a lot of epigastric burning.  Then she has had several  episodes of vomiting.  On about the third episode she saw dark red blood  when she vomited.  This occurred a couple of times.  She notes about a  week ago she had a very black and tarry appearing stool.  Her last stool  was 1 week ago.  She has had persistent burning and vomiting, therefore  was admitted for further evaluation.   Since admission her lipase was normal at 22.  LFTs normal, MET-7 normal.  Amylase normal at 72.  CBC normal with hemoglobin of 14.7, white count  6600.  She had acute abdominal series done which showed a moderate  amount of feces in the colon,  otherwise unremarkable.   MEDICATIONS PRIOR TO ADMISSION:  1. Lamictal 200 mg daily.  2. Oxycodone/APAP every 4 hours as needed.  3. Klonopin 1 mg nightly.  4. Ambien 10 mg nightly.  5. Recently discontinued naproxen.  6. Previously on Protonix.  None in the last 1 month due to Medicare      refusing to cover.   ALLERGIES:  REGLAN.   PAST MEDICAL HISTORY:  1. History of recurrent pancreatitis since cholecystectomy in 2001.      She has had multiple admissions.  She was evaluated several times      at [facility] and her course has      been complicated by recurrent pseudocyst formation.  She had      endoscopic ultrasound, at least one ERCP with external and internal      drainage of the pseudocyst there.  It was felt that she may have  a      kink versus a stricture of her pancreatic duct at the junction      between the head and the body, but this was not amenable to      stenting.  She was seen by Dr. Rockie Neighbours there, GI surgeon, who      did not feel like he had anything to offer her from a surgery      standpoint.  Her last hospitalization for pancreatitis was in 2008.      No prior history of alcohol use.  2. She also has a history of bipolar disorder.  3. History of intentional overdose.  4. She has had gastroesophageal reflux disease status post      laparoscopic knee Nissen fundoplication.  5. She has had a cholecystectomy.  6. Cesarean section.  7. Partial vaginal hysterectomy, cystoscopy 2008.  8. She had an EGD in 2003 by Dr. [name].  She had an intact      fundoplication, a large area of extrinsic compression on the      posterior gastric wall due to pseudocyst.  9. She reports a remote colonoscopy but I do not have those records at      this time.   FAMILY HISTORY:  Mother has fibromyalgia.  Father is diabetic.  Maternal  grandmother has cirrhosis, Lawerance Cruel, who is a patient of ours.  No  family history of colon  cancer.   SOCIAL HISTORY:  She is married.  She has two daughters.  She is on  disability.  No alcohol, drug or tobacco use.   REVIEW OF SYSTEMS:  See HPI for GI.  CONSTITUTIONAL:  She reports a 30-  pound weight loss in the last 1 year.  She states it was not intentional  but it has leveled off.  She would not go into details.  CARDIOPULMONARY:  Denies chest pain, shortness of breath, palpitations  or cough.  GENITOURINARY:  Denies dysuria, hematuria.   PHYSICAL EXAM:  Temperature 98.6, pulse 69, respirations 20, blood  pressure 94/57, height 64 inches, weight 77.1 kg.  GENERAL:  Pleasant obese Caucasian female in no acute distress.  SKIN:  Warm and dry.  No jaundice.  HEENT:  Sclerae nonicteric.  Oropharyngeal mucosa moist and pink.  CHEST:  Lungs are clear to auscultation.  CARDIAC:  Exam reveals regular rate and rhythm.  No murmurs, rubs or  gallops.  ABDOMEN:  Positive bowel sounds.  Abdomen is obese, soft.  She has mild  epigastric tenderness to deep palpation.  No rebound or guarding.  No  organomegaly or masses.  No abdominal bruits or hernias.  LOWER EXTREMITIES:  No edema.   LABORATORIES:  As mentioned above.  Her last CT of the abdomen imaging  was in August 2009.  She had a fatty liver, pancreatic calcifications  without pancreatic ductal dilation or stranding and a 5 mm  nonobstructing right upper pole calculus.   IMPRESSION:  The patient is a 40 year old lady with a 1-week history of  epigastric tenderness hematemesis and one episode of black stool.  She  also gives a 64-month history of intermittent bright red blood per  rectum.  Her hemoglobin is normal.  She has recent naproxen use and  therefore at risk for peptic ulcer disease.  She has also been off her  proton pump inhibitor therapy.  It is unclear at this time whether or  not the bright red blood per rectum is related to current issues.  This  may simply be a benign anorectal source, but will need to be  further  evaluated at a later date.   RECOMMENDATIONS:  1. Agree with intravenous Protonix.  2. Esophagogastroduodenoscopy tomorrow morning with Dr. [name].  I have      discussed risks, alternatives, benefits with regards to but not      limited to the risks of reaction to medication, bleeding,      infection, perforation and she is agreeable to proceed.  3. We will plan on an outpatient colonoscopy at a later date.  At this      point time it does not appear to be an urgent issue and the patient      was unlikely tolerate the prep.  4. Hemoglobin and hematocrit in the morning.      Tana Coast, P.AJonathon Bellows, M.D.  Electronically Signed    LL/MEDQ  D:  12/30/2008  T:  12/30/2008  Job:  [id number]   cc:   Donna Bernard, M.D.  Fax: [phone]

## 2011-02-20 NOTE — H&P (Signed)
NAME:  [name] [name], [name] [name]                ACCOUNT NO.:  [id number]   MEDICAL RECORD NO.:  [id number]         PATIENT TYPE:  PINP   LOCATION:  A335                          FACILITY:  [facility]   PHYSICIAN:  Donna Bernard, M.D.DATE OF BIRTH:  Jan 03, 1971   DATE OF ADMISSION:  DATE OF DISCHARGE:  LH                              HISTORY & PHYSICAL   CHIEF COMPLAINT:  Abdominal pain, headache.   SUBJECTIVE:  This patient is a 40 year old female with a history of  recurrent gastritis, recurrent pancreatitis, esophageal ulcers, migraine  headaches, bipolar disease along with depression and anxiety, who  presented to the office the day of admission with complaints of very  severe pain.  The patient complains of epigastric pain 8/10 radiating  towards the back.  This has been accompanied by significant nausea.  The  patient has been using Phenergan that has helped a little for the nausea  and oxycodone that has not helped for the pain.  The patient also had  severe headache, frontal nature, right-sided primarily radiating to the  nuchal region pounding intermittently, no visual sequelae.  The patient  has a long-standing history of migraine headaches.  The patient  presented feeling very poorly with feeling the need of something had to  be done.  She also noted increased stress lately at home and work.   PRIOR MEDICAL HISTORY:  Significant for chronic problems as noted above.   PRIOR SURGICAL HISTORY:  Remote C-section, remote cholecystectomy.   FAMILY HISTORY:  Positive for coronary artery disease, kidney stones.   ALLERGIES:  REGLAN.   CHRONIC MEDICATIONS:  1. Neurontin 400 mg p.o. nightly.  2. Klonopin 1 mg p.o. q.h.s.  3. Lamictal 200 mg p.o. nightly.  4. Phenergan 25 mg q.4-6 p.r.n.   SOCIAL HISTORY:  The patient is married with children.  No tobacco use.  No alcohol use and notes minimal anti-inflammatories medicine usage.   REVIEW OF SYSTEMS:  Otherwise negative.   PHYSICAL  EXAMINATION:  VITAL SIGNS:  BP 120/68, afebrile.  The patient  suffered significant malaise, holding her abdomen with the lights turned  down.  NECK:  Supple.  HEENT:  TMs, normal fundi, disks sharp.  Pharynx normal.  LUNGS:  Clear.  HEART:  Regular rate and rhythm.  ABDOMEN:  Very significant epigastric tenderness.  Some guarding.  No  rebound.  No CVA tenderness.  Good bowel sounds.  No masses.  EXTREMITIES:  Normal.   IMPRESSION:  1. Gastritis, probable diagnosis with history of pancreatitis, need to      rule this out.  2. Migraine headache with significant nausea and pain.  3. Irritable bowel syndrome.  4. History of ulcer disease.  5. Depression and anxiety/bipolar disease.   PLAN:  Admit for IV fluids, IV pain control, nausea control.  Further  blood work to assess other potential causes.  Further orders as noted in  the chart.      Donna Bernard, M.D.  Electronically Signed     WSL/MEDQ  D:  03/04/2009  T:  03/05/2009  Job:  [id number]

## 2011-02-20 NOTE — Op Note (Signed)
NAME:  [name] [name], [name] [name]                ACCOUNT NO.:  [id number]   MEDICAL RECORD NO.:  [id number]          PATIENT TYPE:  AMB   LOCATION:  DAY                           FACILITY:  [facility]   PHYSICIAN:  R. Roetta Sessions, M.D. DATE OF BIRTH:  1971/02/26   DATE OF PROCEDURE:  03/14/2009  DATE OF DISCHARGE:                               OPERATIVE REPORT   PROCEDURE PERFORMED:  Diagnostic ileocolonoscopy.   INDICATIONS FOR PROCEDURE:  A 40 year old lady with chronic constipation  and intermittent hematochezia.  History chronic pancreatitis on  pancreatic enzymes supplementation.   Colonoscopy is now being done to further evaluate hematochezia.  Risks,  benefits, alternatives and limitations have been reviewed, questions  answered.  Please see documentation in the medical record.   PROCEDURE NOTE:  O2 saturation, blood pressure, pulse and respirations  monitored throughout entire procedure.   CONSCIOUS SEDATION:  Versed 7 mg IV, Demerol 125 mg IV in divided doses.  Phenergan 25 mg IV diluted slow IV push to augment conscious sedation  and for antiemetic effect.   INSTRUMENT:  Pentax video chip system.   FINDINGS:  Digital rectal exam revealed a couple of external  hemorrhoidal tags.   ENDOSCOPIC FINDINGS:  Prep was adequate.  Colon:  Colonic mucosa was surveyed from the rectosigmoid junction  through the left, transverse, right colon to the area of appendiceal  orifice, ileocecal valve and cecum.  These structures were well seen and  photographed for the record.  Terminal ileum was intubated to 10 cm.  From this level scope was slowly and cautiously withdrawn.  All  previously mentioned mucosal surfaces were again seen.  The colonic  mucosa appeared normal as did the terminal ileal mucosa.  Scope was  pulled down in the rectum where a thorough examination of the rectal  mucosa including retroflex view of the anal verge demonstrated only some  anal papilla.  The patient tolerated the  procedure well and was reacted  in endoscopy.  Cecal withdrawal time 7 minutes.   IMPRESSION:  Anal papilla, minimal external hemorrhoidal tags, otherwise  normal rectum, normal colon, normal terminal ileum.  I suspect trivial  anorectal bleeding of benign etiology.   RECOMMENDATIONS:  1. Anusol-HC suppositories one per rectum at bedtime times 10 days.  2. Can continue MiraLax 17 grams orally at bedtime p.r.n.      constipation.  3. Plan follow-up appointment with [place] in the near future.      Jonathon Bellows, M.D.  Electronically Signed     RMR/MEDQ  D:  03/14/2009  T:  03/14/2009  Job:  [id number]   cc:   Donna Bernard, M.D.  Fax: [phone]

## 2011-02-20 NOTE — Consult Note (Signed)
NAME:  [name] [name], [name] [name]                ACCOUNT NO.:  [id number]   MEDICAL RECORD NO.:  [id number]          PATIENT TYPE:  INP   LOCATION:  A332                          FACILITY:  [facility]   PHYSICIAN:  Kassie Mends, M.D.      DATE OF BIRTH:  September 10, 1971   DATE OF CONSULTATION:  06/04/2009  DATE OF DISCHARGE:                                 CONSULTATION   PRIMARY CARE PHYSICIAN:  Dr. Mikeal Hawthorne.   REASON FOR CONSULTATION:  Abdominal pain.   HISTORY OF PRESENT ILLNESS:  [name] [name] is a 40 year old female who is  usually seen by Dr. [name] for recurrent pancreatitis.  She reports being  sick since last Thursday.  She vomited and had headache.  She denies any  fever or chills.  She had no diarrhea.  Her last bowel movement was  Thursday.  She denies any blood in her vomit or stool.  She has not been  on any new medications.  She denies any chest pain, shortness of breath,  back or leg pain.  She still has a dull headache.  She says her  abdominal pain is about the same as it was yesterday.  She last vomited  Wednesday.  She is just on ice chips.   PAST MEDICAL HISTORY:  1. Bipolar disorder.  2. History of overdose.  3. GERD.  4. History of Helicobacter pylori gastritis.   PAST SURGICAL HISTORY:  1. Nissen fundoplication  2. Cholecystectomy.  3. C-section.  4. Hysterectomy.  5. Cystoscopy.   ALLERGIES:  REGLAN.   MEDICATIONS:  1. Clonazepam.  2. Colace.  3. Lamictal.  4. Protonix.  5. K-Dur.  6. Topamax.  7. Ambien.  8. Morphine 5 mg IV every 3 hours (10 mg on August 27).  9. Zofran as needed (0 mg used since admission).  10.Phenergan as needed (25 mg on August 27).   FAMILY HISTORY:  Her mother has fibromyalgia.  She had no family history  of colon cancer.   SOCIAL HISTORY:  She is married and has two daughters.  She is disabled.   REVIEW OF SYSTEMS:  As per the HPI otherwise all systems are negative.   PHYSICAL EXAM:  VITAL SIGNS:  T-max 98.6, systolic blood pressure  107/92,  pulse 66, respiratory rate 18.  GENERAL:  She is in no apparent distress.  She is sedated but arousable.  HEENT:  Exam is atraumatic, normocephalic.  Pupils equal and reactive to  light.  Mouth - no oral lesions.  Posterior oropharynx without erythema  or exudate.  NECK:  Has full range of motion.  No lymphadenopathy.  LUNGS:  Clear to auscultation bilaterally.  CARDIOVASCULAR:  Regular rhythm, no murmur, normal S1, S2.  ABDOMEN:  Bowel sounds are present, soft, obese, nondistended, mild  tenderness to palpation in all four quadrants without rebound or  guarding.  EXTREMITIES:  No cyanosis or edema.  NEUROLOGICAL:  No focal neurologic deficits.   LABS:  Potassium 3.4, creatinine 0.82, hemoglobin 11.3, platelets 188,  lipase 26.  UA negative.   RADIOGRAPHIC STUDIES:  Abdominal series on August 28 showed a  dilated  large and small bowel and stool in the right colon.  CT scan of the  abdomen and pelvis on August 28 showed no small bowel obstruction, no  acute abdominal findings, a distended bladder and a small amount of  fluid in the gutters.   ASSESSMENT:  [name] [name] is a 40 year old female who appears to have had a  mild ileus for an unknown reason.  She does have stool in the right side  of her colon.  She has a history of chronic abdominal pain and she most  likely has a functional bowel disorder.  She may be having exacerbation  at this time.  Thank you for allowing me to see Ms. Port in  consultation.  My recommendations follow:   RECOMMENDATIONS:  1. Minimize narcotic use.  2. Add Dulcolax.  3. Advance diet.      Kassie Mends, M.D.  Electronically Signed     SM/MEDQ  D:  06/05/2009  T:  06/05/2009  Job:  [id number]   cc:   Donna Bernard, M.D.  Fax: [phone]

## 2011-02-23 ENCOUNTER — Inpatient Hospital Stay (HOSPITAL_COMMUNITY)
Admission: AD | Admit: 2011-02-23 | Discharge: 2011-02-24 | DRG: 393 | Disposition: A | Discharge status: Other Acute Inpatient Hospital | Payer: Medicare Other | Source: Ambulatory Visit | Attending: Internal Medicine | Admitting: Internal Medicine

## 2011-02-23 ENCOUNTER — Emergency Department (HOSPITAL_COMMUNITY): Payer: Medicare Other

## 2011-02-23 DIAGNOSIS — F319 Bipolar disorder, unspecified: Secondary | ICD-10-CM | POA: Diagnosis present

## 2011-02-23 DIAGNOSIS — Z79899 Other long term (current) drug therapy: Secondary | ICD-10-CM

## 2011-02-23 DIAGNOSIS — Y832 Surgical operation with anastomosis, bypass or graft as the cause of abnormal reaction of the patient, or of later complication, without mention of misadventure at the time of the procedure: Secondary | ICD-10-CM | POA: Diagnosis present

## 2011-02-23 DIAGNOSIS — T8140XA Infection following a procedure, unspecified, initial encounter: Secondary | ICD-10-CM | POA: Diagnosis present

## 2011-02-23 DIAGNOSIS — K929 Disease of digestive system, unspecified: Principal | ICD-10-CM | POA: Diagnosis present

## 2011-02-23 DIAGNOSIS — E119 Type 2 diabetes mellitus without complications: Secondary | ICD-10-CM | POA: Diagnosis present

## 2011-02-23 DIAGNOSIS — K659 Peritonitis, unspecified: Secondary | ICD-10-CM | POA: Diagnosis present

## 2011-02-23 DIAGNOSIS — A419 Sepsis, unspecified organism: Secondary | ICD-10-CM | POA: Diagnosis present

## 2011-02-23 LAB — DIFFERENTIAL
Basophils Absolute: 0 10*3/uL (ref 0.0–0.1)
Basophils Relative: 0 % (ref 0–1)
Eosinophils Absolute: 0 10*3/uL (ref 0.0–0.7)
Eosinophils Relative: 0 % (ref 0–5)
Monocytes Absolute: 0.3 10*3/uL (ref 0.1–1.0)
Monocytes Relative: 4 % (ref 3–12)
Neutro Abs: 6.3 10*3/uL (ref 1.7–7.7)

## 2011-02-23 LAB — CBC
Hemoglobin: 10.5 g/dL — ABNORMAL LOW (ref 12.0–15.0)
MCH: 25.4 pg — ABNORMAL LOW (ref 26.0–34.0)
MCHC: 30.9 g/dL (ref 30.0–36.0)
Platelets: 274 10*3/uL (ref 150–400)
RDW: 17.2 % — ABNORMAL HIGH (ref 11.5–15.5)

## 2011-02-23 LAB — COMPREHENSIVE METABOLIC PANEL
ALT: 57 U/L — ABNORMAL HIGH (ref 0–35)
AST: 70 U/L — ABNORMAL HIGH (ref 0–37)
CO2: 23 mEq/L (ref 19–32)
Calcium: 10.5 mg/dL (ref 8.4–10.5)
Creatinine, Ser: 0.63 mg/dL (ref 0.4–1.2)
GFR calc Af Amer: >60 mL/min (ref >60)
GFR calc non Af Amer: >60 mL/min (ref >60)
Sodium: 140 mEq/L (ref 135–145)
Total Protein: 8.5 g/dL — ABNORMAL HIGH (ref 6.0–8.3)

## 2011-02-23 NOTE — Discharge Summary (Signed)
NAME:  [name] [name], [name] [name]                ACCOUNT NO.:  [id number]   MEDICAL RECORD NO.:  [id number]          PATIENT TYPE:  INP   LOCATION:  A301                          FACILITY:  [facility]   PHYSICIAN:  Donna Bernard, M.D.DATE OF BIRTH:  1971/06/30   DATE OF ADMISSION:  01/25/2005  DATE OF DISCHARGE:  04/24/2006LH                                 DISCHARGE SUMMARY   FINAL DIAGNOSES:  1.  Acute pancreatitis.  2.  History of chronic idiopathic pancreatitis.  3.  Bipolar disease.   FINAL DISPOSITION:  1.  Patient discharged home.  2.  Low-fat diet.  3.  Oxycodone 5-mg tablets, #30, 1 q.4-6h. p.r.n. for pain, Phenergan 25 mg      1 q.4-6h. p.r.n. for nausea and Pancrease supplement 3 with meals, 2      with snacks the next 7 days.  4.  Follow up in the office in one week.   For initial H&P, please see H&P as dictated.   HOSPITAL COURSE:  This patient is a 40 year old white female with a history  of intermittent pancreatitis.  She has been worked up thoroughly.  It is  felt to be idiopathic.  She was doing relatively well in her usual state of  fair health when she developed significant pain and tenderness in the night  before admission.  Her enzymes were found to be significantly elevated.  The  patient was placed in the hospital on bowel rest and IV fluids.  Pain  medicine was administered, Dilaudid  IV.  Of note, the patient has had a  problem in the past with patient-controlled analgesia.  The patient was also  given Phenergan for nausea.  Over the next several days, her enzymes dropped  nicely.  The last couple of days, they have been normal.  She is continuing  to have nausea and some abdominal discomfort.  This morning, though, her  abdominal exam is significantly improved, and she is felt ready to go home  with diagnosis and disposition as noted above.      WSL/MEDQ  D:  01/29/2005  T:  01/29/2005  Job:  [id number]

## 2011-02-23 NOTE — H&P (Signed)
[facility]  Patient:    [name] [name], [name] [name]                       MRN: [id number] Adm. Date:  [id number] Attending:  Rachael Fee Dictator:   Candi Leash. Orsini, N.P.                   Psychiatric Admission Assessment  ADDENDUM to reports #[id number] and #[id number].  SOCIAL HISTORY:  This is a 40 year old separated white female with two children, ages 1 and 76.  She lives with Farrell Ours, a friend.  Her children are with the mother right now.  She is a housewife.  She has completed one year of college.  She has no legal problems.  She recently began dating someone.  FAMILY HISTORY:  None that she is aware.  ALCOHOL/DRUG HISTORY:  She denies any alcohol or substance abuse.  PRIMARY CARE PHYSICIAN:  Dr. [name] in [place].  MEDICAL PROBLEMS:  Status post cholecystectomy and status post laparoscopic Nissen procedure for GERD.  MEDICATIONS:  Paxil 40 mg everyday, Seroquel 50 mg p.o. q.h.s., Xanax 0.5 mg q.6h. p.r.n.  DRUG ALLERGIES:  REGLAN.  PHYSICAL EXAMINATION:  Performed at [facility].  LABORATORY EXAMINATION:  Patients SGOT is elevated at 40, SGPT is elevated at 57.  Urine drug screen is positive for benzodiazepines.  Patient is on Xanax. Alcohol level is less than 5.  Urine pregnancy test is negative.  MENTAL STATUS EXAMINATION:  She is an alert, overweight Caucasian female dressed in hospital-ware.  Good eye contact.  She is very cooperative.  Speech is normal and relevant.  Mood is euthymic.  Affect is sleepy.  Patient does not appear depressed.  Thought processes are coherent.  There is no evidence of psychosis.  No auditory or visual hallucinations.  No paranoia.  No suicidal or homicidal ideation.  Cognitive function is intact.  Memory is good.  Judgment is impaired.  Insight is fair.  Appears to be of average intelligence.  DIAGNOSES: Axis I:    1. Major depression, recurrent.            2. Overdose on Xanax. Axis II:    Deferred. Axis III:  None. Axis IV:   Moderate (problems related to primary support group, housing and            economics). Axis V:    Current 30; past year 95.  PLAN:  Voluntary admission to [facility] for depression and overdose on Xanax.  Contract for safety.  Check every 15 minutes.  Patient promises safety.  Will resume her routine medications.  Our plan is to return patient to her roommate and children and to follow up with [facility] when patient moves to [facility] for patient to attend the IOP Program.  TENTATIVE LENGTH OF STAY:  Two to three days. DD:  03/12/01 TD:  03/12/01 Job: [id number]

## 2011-02-23 NOTE — H&P (Signed)
[facility]  Patient:    [name] [name], [name] [name]                         MRN: [id number] Adm. Date:  03/12/01 Dictator:   Candi Leash. Orsini, N.P.                   Psychiatric Admission Assessment  ADDENDUM TO REPORT #[id number]  PAST PSYCHIATRIC HISTORY:  Patient had a recent hospitalization at [facility] on Mar 01, 2001 for depression and suicidal ideation.  Discharged that following Thursday.  She is to attend the [facility] for follow-up services.  SOCIAL HISTORY:  She is a 40 year old separated white female.  She has two children, ages 1 and 55, and she lives with Farrell Ours, a friend.  Children are with DD:  03/12/01 TD:  03/12/01 Job: [id number]

## 2011-02-23 NOTE — Discharge Summary (Signed)
[facility]  Patient:    [name] [name], [name] [name] Visit Number: [id number] MRN: [id number]          Service Type: EMS Location: ED Attending Physician:  Herbert Seta Dictated by:   Jeanice Lim, M.D. Admit Date:  01/16/2002 Discharge Date: 01/16/2002                             Discharge Summary  NO DICTATION Dictated by:   Jeanice Lim, M.D. Attending Physician:  Herbert Seta DD:  02/04/02 TD:  02/04/02 Job: [id number]

## 2011-02-23 NOTE — Consult Note (Signed)
[facility]  Patient:    [name] [name], [name] [name] Visit Number: [id number] MRN: [id number]          Service Type: MED Location: 3A A302 01 Attending Physician:  Cassell Smiles. Dictated by:   Roetta Sessions, M.D. Proc. Date: 10/26/01 Admit Date:  10/26/2001   CC:         Elfredia Nevins, M.D.   Consultation Report  REASON FOR CONSULTATION:  Abdominal pain.  HISTORY OF PRESENT ILLNESS:  This patient is a 40 year old Caucasian female admitted to the hospital early this morning with abdominal pain.  She has a long-standing chronic constipation; has one bowel movement weekly.  The constipation has worsened recently.  She came to the emergency department and was evaluated.  Acute abdominal series revealed a colon full of stool without evidence of bowel obstruction, free air, or other abnormality.  White count 7.4, H&H 12.7 and 36.5, MCV 91.7, SGOT 75, SGPT of 111, albumin 3.2.  Lipase 125, amylase 85.  Urine pregnancy test negative.  Urinalysis negative except for total protein greater than 300 mg/dL.  She has been admitted to the hospital and placed on PCA-Demerol pump and ultrasound of the abdomen and kidneys have been ordered for tomorrow morning.  The patient has a history of undergoing a laparoscopic Nissen fundoplication and cholecystectomy, at the same time, by Dr. Lovell Sheehan back in November 2001. She was hospitalized a couple of weeks later with abdominal pain, elevated lipase, amylase; mildly elevated LFTs.  There was concern about the possibility of traumatic pancreatitis related to surgery.  In addition, CT at that time revealed some inflammatory changes of the pericolonic fat in the left colon and free intra-abdominal fluid.  There was also a concern about the possibility of a microperforation.  She recovered from this acute illness.  We have seen her in the office over the past 12 months and treated her for chronic constipation with good results  utilizing miralax.  She confirms that miralax did help her significantly, her abdominal pain was better, but she ran out/stopped taking the medication.  She denies diarrhea.  She had one, almost back stool, 1 month ago.  She has not had any blood per rectum.  No nausea or vomiting.  She continues to have chronic reflux symptoms and intermittent esophageal dysphagia for which she takes some antacids.  She tells me that overall her reflux was less than prior to surgery.  It is notable that on prior CT scan her pancreas looked entirely normal.  Her biliary tree was not dilated.  She does not drink alcohol.  Her current medications are Paxil, Xanax, and Seroquel.  PAST MEDICAL HISTORY:  Her past medical history is significant for status post tubal ligation, status post two C-sections, status post Nissen fundoplication and cholecystectomy.  Past medical illness is irritable bowel syndrome, constipation, predominant history of depression, history of GERD.  CURRENT MEDICATIONS:  Paxil 37.5 mg daily, Xanax 1 mg q.i.d., Seroquel 100 mg q.h.s.  ALLERGIES:  REGLAN associated with acute dystonic reaction.  FAMILY HISTORY:  Mother has type 2 diabetes mellitus.  Parents are in good health.  One maternal uncle has IDDM.  SOCIAL HISTORY:  The patient is married, has 2 children.  She is a Interior and spatial designer.  Does not use tobacco or alcohol.  REVIEW OF SYSTEMS:  As above, has not had any fever or chills or night sweats. No melena or rectal bleeding.  PHYSICAL EXAMINATION:  GENERAL:  Physical examination reveals a pleasant, 40 year old lady resting comfortably  in her hospital bed.  VITAL SIGNS:  Temperature 97.3, pulse 93, respiratory rate 20, BP 134/100.  SKIN:  Warm and dry, no jaundice, no subcutaneous stigmata of chronic liver disease.  HEENT:  No sclerae icterus.  Oral cavity:  No lesions.  JVD is not prominent.  CHEST:  Lungs are clear to auscultation.  CARDIAC:  Regular rate and rhythm  without murmur, gallop, or rub.  ABDOMEN:  Obese.  Positive bowel sounds.  Soft with mild diffuse tenderness to palpation.  No appreciable mass or organomegaly is noted (The patient is on a PCA-Demerol pump).  EXTREMITIES:  No edema.  LABORATORY EVALUATION:  Sodium 140, potassium 3.7, chloride 103, carbon dioxide 31, glucose 124, BUN 5, creatinine 0.9, calcium 8.6, total protein 6.7, albumin 3.3, SGOT 75, SGPT 111, alkaline phosphatase 80, total bilirubin 0.5, lactase 125, amylase 85, urine pregnancy negative.  Urinalysis positive for proteinuria.  As noted above, reviewed the acute abdominal series with Dr. Lorelle Gibbs ______ in the radiology department this morning.  The patient indeed has quite a bit of stool throughout her colon without evidence of obstruction or free air.  IMPRESSION:  This patient is a pleasant 40 year old lady admitted to the hospital with exacerbation of constipation in a setting of recurrent abdominal pain.  She has a large stool load in her colon.  Interestingly she does have mildly elevated SGOT and SGPT as well as a mildly elevated lipase.  These lab abnormalities have been noted previously.  At this time, I am not convinced that this lady has pancreatitis.  Etiology of her LFT abnormalities is not well defined at this time.  It is possible that she does have microlithiasis in her common bile duct and has passed some small calculi recently producing mild pancreatitis.  Alternative to lipase may be elevated nonspecifically in this setting, and the patient may simply have a fatty liver.  I need to review the office records to see if she has had elevated transaminases in the past year and if any other evaluation has taken place.  RECOMMENDATIONS:  Colonic prep with GoLytely.  We will place a small bore NG-tube to deliver 4 L of GoLytely over 4 hours.  Will give her some enemas later today.   If this is associated with marked improvement in her abdominal  pain we can continue her workup of her LFT abnormalities and Lipase separately.  If her abdominal pain is not significantly improved then next step would be abdominal-pelvic CT scan.  Ultimately, she may end up getting an MCRP to rule out common duct stone prior to pursuing an endoscopic ultrasound of the pancreas.  Further recommendations to follow.  I would like to thank Dr. Sherwood Gambler for letting me see this patient once again. Dictated by:   Roetta Sessions, M.D. Attending Physician:  Cassell Smiles DD:  10/26/01 TD:  10/26/01 Job: [id number]

## 2011-02-23 NOTE — Consult Note (Signed)
NAME:  [name] [name], [name] [name]                          ACCOUNT NO.:  [id number]   MEDICAL RECORD NO.:  [id number]                   PATIENT TYPE:  INP   LOCATION:  A308                                 FACILITY:  [facility]   PHYSICIAN:  Gerrit Friends. Rourk, M.D.               DATE OF BIRTH:  08-29-1971   DATE OF CONSULTATION:  05/19/2002  DATE OF DISCHARGE:                                   CONSULTATION   REASON FOR CONSULTATION:  Pancreatitis, epigastric pain.   HISTORY OF PRESENT ILLNESS:  The patient is a pleasant 40 year old Caucasian  female with a history of bipolar disorder who recently had a hospitalization  for intentional severe overdose. 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 She had near pulmonary  failure and almost required intubation. She was transferred to the [facility] at [facility] for a psychiatric evaluation.  After discharge from rehab, she developed epigastric pain associated with  nausea and vomiting. She had this for approximately three days prior to this  admission. She was seen on two occasions as an outpatient and was noted to  have a slightly elevated amylase and lipase. She was placed on Aciphex 20 mg  daily during this time but it did not seem to help. Because of persistent  symptoms, she eventually required admission four days ago. She has a history  of recurrent pancreatitis, she tells me this makes her fourth admission for  this. Please see past medical history for further details.   She complains of epigastric pain which radiates into her back. It is quite  severe at times. It is associated with nausea and vomiting. She denies any  hematemesis, melena, rectal bleeding, heartburn, NSAID use, constipation,  diarrhea. She tells me she has had a low grade fever. She was started on  Abilify approximately one week ago while still at the psychiatric rehab  center. Since this hospitalization, she  continues to have epigastric pain.  She tells me that it is actually worse today than on admission. Because she  has failed to improve as one would expect, we have been consulted for  possible upper endoscopy to rule out peptic ulcer disease (given recent  stress related with hospitalization).   On admission, her amylase was 156, lipase 111. Numbers have been staying  pretty persistent today, amylase is 182, lipase 147. On admission hemoglobin  was 11.6, hematocrit 34.1, MCV normal. White count 12.8, platelets 516,000.  LFTs were normal except for albumin of 2.9. Triglycerides were 75. On May 17, 2002, hemoglobin was down to 10, hematocrit 29.8, MCV 88.9, sed rate 85.  Her sed rate was up to 115 yesterday. She had an acute abdominal series  which revealed a normal bowel gas pattern otherwise unremarkable. She has  been receiving morphine p.r.n. abdominal pain. She has taken at least 12  doses  since  admission but none over the last two days. She had been given  Tylox p.o. which she tells me does not work.   On examination today, she continues to have moderate epigastric pain. Rectal  exam revealed Hemoccult negative secretions. She has significant rash on her  buttocks with erythema and satellite type lesions suspicious of tenia.   MEDICATIONS AT HOME:  1. Abilify.  2. Tylenol p.r.n.  3. Aciphex 20 mg daily started last week.   ALLERGIES:  REGLAN causes muscle tightness.   PAST MEDICAL HISTORY:  1. History of 3-4 episodes of pancreatitis. First episode November 2001 at     which time she was felt to have pancreatitis due to microlithiasis. Her     first time was in November 2001 after undergoing laparoscopic Nissen     fundoplication and cholecystectomy by Dr. Lovell Sheehan. She was hospitalized a     couple weeks after surgery with abdominal pain, elevated lipase, amylase     and mildly LFTs. There was concern about the possibility of the traumatic     pancreatitis related to  surgery versus microperforation or even     microlithiasis. Her pancreatitis has been well documented by CT scan as     well as MRCP. We saw her in January of 2003 for constipation and she was     eventually noted to have pancreatitis during that hospitalization. MRCP     revealed a fatty liver and chronic remote inflammatory changes of the     pancreas. A CT in January 2003 revealed mild peripancreatic inflammation     questionably remote or acute inflammation as well as a fatty liver.     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 She also has a history of bipolar disorder with multiple     hospitalization with intentional overdose. Her most recent episode was     April 29, 2002 at which time she overdosed on Seroquel and lithium. She     was in near pulmonary failure, almost required intubation. She did     require dialysis x1 for lithium toxicity. She was transferred to the     psychiatric unit at [facility].  2. History of chronic constipation and irritable bowel syndrome.  3. Gastroesophageal reflux disease.  4. Kidney stones.  5. Recurrent migraines.  6. History of H. pylori status post treatment.  7. Tubal ligation.  8. Cesarean section x2.  9. Cholecystectomy.  10.      Right Nissen, November 2001.   FAMILY HISTORY:  Father had Hirschsprung's disease as a child but did not  require surgery. Maternal grandmother, aunt and cousin with uterine cancer.  Her grandfather had a history of chronic pancreatitis secondary to what  sounds like penetrating peptic ulcer disease.   SOCIAL HISTORY:  She is married, has two children and does not work. No  alcohol or tobacco use.   REVIEW OF SYMPTOMS:  As in HPI, otherwise, unremarkable.   PHYSICAL EXAMINATION:  VITAL SIGNS:  Temperature 99.1, pulse 98,  respirations 20, blood pressure 109/68. Weight 194.3, height 5 foot 5 inches. GENERAL:  Pleasant, well-nourished, well-developed  Caucasian female  in no acute distress. SKIN:  Warm and dry no jaundice. HEENT:  Conjunctivae  are pink. Sclerae nonicteric. Oropharyngeal mucosa moist and pink. No  lesions, erythema, or exudate. No lymphadenopathy, thyromegaly, carotid  bruits. CHEST:  Lungs are clear to auscultation. CARDIAC:  Reveals regular  rate and rhythm. Normal S1,  S2. No murmur, gallop or rub. ABDOMEN:  Positive  bowel sounds, obese but symmetrical, soft. Moderate tenderness in the  epigastric region with deep palpation. No appreciable mass or organomegaly.  EXTREMITIES:  No edema.   LABORATORY DATA:  As mentioned in the HPI. In addition, total bilirubin 0.3,  alkaline phosphatase 74, SGOT 28, SGPT 35. Sodium 134, potassium 4.3, BUN  less than 2, creatinine 0.6, calcium 8.8.   IMPRESSION:  The patient is a pleasant 40 year old Caucasian female with a  history of recurrent pancreatitis, etiology not well defined. She has been  suspected to have pancreatitis secondary to microlithiasis or even sphincter  of Oddi dysfunction in the past. There is no history of alcohol use,  hypocalcemia, hypertriglyceridemia. MRCP in January 2003 did not reveal  cause of pancreatitis. After performing a quick drug chuck on Abilify, there  has been some documentation of pancreatitis with this medication. She was  just started on this medication a few days prior to the development of her  symptoms. Other possibilities would include penetrating or perforated peptic  ulcer disease, given her recent hospitalization for severe overdose and  stress related to that as well as persistent epigastric pain, I feel that we  do need to consider this possibility.   SUGGESTIONS:  1. Esophagogastroduodenoscopy today. If this is normal will proceed with a     CT of the abdomen and pelvis.  2. Agree with IV PPI therapy.  3. Lotrisone cream b.i.d. for tenia/rash.  4. Consider other analgesic given that morphine can actually worsen      pancreatitis.   I would like to thank Dr.  [name] for allowing [place] to take part in the  care of this patient.     Tana Coast, PA                          Gerrit Friends. Rourk, M.D.    LL/MEDQ  D:  05/19/2002  T:  05/19/2002  Job:  [id number]   cc:   Donna Bernard, M.D.  [address]  [place]  [place] [address]  Fax: [phone]

## 2011-02-23 NOTE — Consult Note (Signed)
NAME:  [name] [name], TATE                          ACCOUNT NO.:  [id number]   MEDICAL RECORD NO.:  [id number]                   PATIENT TYPE:  EMS   LOCATION:  ED                                   FACILITY:  [facility]   PHYSICIAN:  Scott A. Gerda Diss, M.D.               DATE OF BIRTH:  [birth year]   DATE OF CONSULTATION:  08/19/2003  DATE OF DISCHARGE:                                   CONSULTATION   REPORT TITLE:  MEDICAL EMERGENCY ROOM CONSULTATION   CONSULTATIONS:  Scott A. Gerda Diss, M.D.   REFERRING PHYSICIAN:  Myrtis Ser, M.D.   CHIEF COMPLAINT:  Abdominal pain.   HISTORY OF PRESENT ILLNESS:  A 40 year old white female well known to [place]  with chronic pancreatitis with frequent recurring acute bouts. She recently  got out of the hospital three days ago.  She states she was unable to get  her oxycodone or her Duragesic patch filled because she had exceeded her  prescription limitations. She came here, to the emergency room, because of  burning pain and discomfort.  She denied vomiting, but relayed nausea.  She  states the pain is a 7/10. She denies fever, sweats, chills, or diarrhea.  She denies swelling in the legs, shortness of breath, or chest congestion or  cough.   PAST MEDICAL HISTORY:  Chronic pancreatitis along with anxiety issues.  History of bipolar disease migraine headaches, and kidney stones.   SOCIAL HISTORY:  Does not smoke.   MEDICINES:  1. Phenergan p.r.n.  2. __________ with meals.  3. Seroquel 600 mg daily.  4. Sonata 20 mg at bedtime.  5. Oxycodone, as needed, for rescue medication.  6. Duragesic patch once q.3 days.   The previous two medicines she had not gotten filled yet.   ALLERGIES:  REGLAN, BUSPAR causes dizziness, DARVOCET nausea.   REVIEW OF SYSTEMS:  As above.   PHYSICAL EXAMINATION:  GENERAL:  NAD.  VITAL SIGNS:  Stable.  HEENT:  Benign.  CHEST:  CTA.  HEART:  Regular.  ABDOMEN:  Soft, moderate epigastric tenderness.  The patient does  not have a  surgical abdomen.  EXTREMITIES:  She is able to freely move around.   LABORATORY DATA:  Blood work shows a normal amylase and lipase that are  actually improved compared to the initial lab work when she came into the  hospital, plus the lab work when she left the hospital.  Of note, white  count is normal and glucose is 119. Liver enzymes look good.   ASSESSMENT/PLAN:  Chronic pancreatitis with mild exacerbation of pain.  I do  not feel that she has acute appendicitis.  She was warned that if she starts  having a lot of vomiting, severe pain, and getting worse that acute illness  may have kicked in and she would need to come back or call us.  In addition  to this she needs  to get her prescriptions filled. We will fill out a  prescription override at the office. She will come over there and pick that  up.  Also, too we will go ahead and give her a shot of pain medication; and  she is to follow up with [place] early in the week with Dr. Brett Canales.      ___________________________________________                                            Jonna Coup. Gerda Diss, M.D.   SAL/MEDQ  D:  08/19/2003  T:  08/19/2003  Job:  [id number]   cc:   Myrtis Ser, M.D.

## 2011-02-23 NOTE — H&P (Signed)
NAME:  [name] [name], [name] [name]                          ACCOUNT NO.:  [id number]   MEDICAL RECORD NO.:  [id number]                   PATIENT TYPE:  INP   LOCATION:  IC06                                 FACILITY:  [facility]   PHYSICIAN:  Donna Bernard, M.D.             DATE OF BIRTH:  1971/09/25   DATE OF ADMISSION:  04/12/2004  DATE OF DISCHARGE:                                HISTORY & PHYSICAL   CHIEF COMPLAINT:  Abdominal pain.   SUBJECTIVE:  This patient is a 40 year old white female with history of  recurrent pancreatitis, chronic bipolar disease and glucose intolerance who  arrives in the emergency room the date of admission with complaints of  abdominal pain.  She has been having ongoing recurrent bouts of  pancreatitis.  She currently is being followed by the specialist at [facility].  They are trying to work her up for any other possible underlying  etiologies to her difficulties.  The patient was in her usual state of fair  health until earlier today when she developed significant epigastric pain.  The patient also had several episodes of vomiting.  She has had no change in  bowel habits.  No change in her __________, but no back pain.  No diarrhea.  No fever.  The pain is quite severe at times.  It has only been a few weeks  since she was last in the hospital with a flare.   MEDICATIONS:  The patient claims complaints with her medications which  include:  1. Phenergan 25 mg q.4h. p.r.n.  2. Seroquel 300 mg q.h.s.  3. Lithium 30 mg three daily.   PAST MEDICAL HISTORY:  Prior surgery is remote C section and remote  cholecystectomy.   FAMILY HISTORY:  Family history is positive for coronary artery disease, and  grandfather with pancreatitis.   ALLERGIES:  REGLAN.   REVIEW OF SYSTEMS:  Otherwise, negative.   PHYSICAL EXAMINATION:  VITAL SIGNS:  Stable, afebrile, 138/80, pulse 100,  respiratory rate normal.  GENERAL APPEARANCE:  The patient is alert, some distress, holding  her  abdomen.  HEENT:  Normal, mucous membranes moist.  NECK:  Supple.  LUNGS:  Clear.  HEART:  Rhythm.  ABDOMEN:  Epigastric tenderness.  No rebound.  No guarding.  No mass.  Good  bowel sounds.  EXTREMITIES:  Normal.   LABORATORY DATA:  CBC:  Met-7 normal.  Lipase elevated at 85.  ER doctor did  not do an amylase.   IMPRESSION:  1. Chronic pancreatitis.  Admit for additional workup via [facility].  They     really have not offered any major explanations as of yet.  Workup is     still pending.  2. Bipolar disease, stable.   PLAN:  Per orders.     ___________________________________________  Donna Bernard, M.D.   [name]  D:  04/13/2004  T:  04/14/2004  Job:  [id number]

## 2011-02-23 NOTE — H&P (Signed)
NAME:  [name] [name], [name] [name]                ACCOUNT NO.:  [id number]   MEDICAL RECORD NO.:  [id number]          PATIENT TYPE:  INP   LOCATION:  A302                          FACILITY:  [facility]   PHYSICIAN:  Lonia Blood, M.D.      DATE OF BIRTH:  1971-04-23   DATE OF ADMISSION:  06/29/2006  DATE OF DISCHARGE:  LH                                HISTORY & PHYSICAL   PRIMARY CARE PHYSICIAN:  Donna Bernard, M.D.   PRESENTING COMPLAINT:  Abdominal pain, and nausea and vomiting.   HISTORY OF PRESENT ILLNESS:  The patient is a 40 year old with known history  of recurrent pancreatitis, who has had workup both here and at [facility] in  [place], with no apparent cause, and also history of bipolar disorder.  The patient came in today secondary to worsening abdominal pain that has  been going on for 2 days now.  Associated with some nausea and vomiting.  In  the ED, the patient was found to have a lipase of almost 800.  She is  subsequently being admitted for further management.  She denied any  hematochezia.  She denied any hematemesis, and no melena.   PAST MEDICAL HISTORY:  Significant mainly for the bipolar disease and the  recurrent pancreatitis.   ALLERGIES:  The patient is allergic to [facility].   SOCIAL HISTORY:  The patient is single, lives with some family members.  She  denied any tobacco or alcohol use.Marland Kitchen   FAMILY HISTORY:  Mainly hypertension.  No history of any GI malignancy.   REVIEW OF SYSTEMS:  A 12-point review of systems is mainly per HPI.   PHYSICAL EXAMINATION:  VITAL SIGNS:  Temperature 97.2, blood pressure  129/90, dropped to 98/63, pulse 80, respiratory rate 20, saturations 100% on  room air.  GENERAL: The patient is obese, in no acute distress.  HEENT: PERRL.  EOMI.  NECK:  Supple.  No JVD, no lymphadenopathy.  RESPIRATORY:  She has good air entry bilaterally.  No wheezes or rales.  CARDIOVASCULAR:  She has S1, S2,  no murmurs.  ABDOMEN:  Obese, soft, with epigastric  tenderness, with positive bowel  sounds.  EXTREMITIES: No edema, cyanosis, or clubbing.   LABORATORIES:  White count of 9.8, hemoglobin 13.8, platelet count 270, with  normal differential.  Urinalysis showed cloudy urine, with some large  hemoglobin, trace protein, positive leukocyte esterase, WBCs 3-6, RBCs 21-  50, with few bacteria.  Sodium 138, potassium 4, chloride 107, CO2 26,  glucose 123, BUN 9, creatinine 0.8, calcium 9.3, total protein 6.7, albumin  3.7, AST 30, ALT 44, alkaline phosphatase 50, total bilirubin 1, amylase is  339, lipase 788.   ASSESSMENT:  This is a 40 year old female with well-known history of  recurrent pancreatitis that is here with what appears to be recurrence of  her pancreatitis.   PLAN:  1. Acute pancreatitis.  Will admit the patient for pain control.  Will      keep her n.p.o. in the beginning, except for ice chips.  We will use      Dilaudid.  Will also try to control her nausea and vomiting.  Once the      patient's symptoms resolve, we will advance her diet prior to      discharge.  The patient has had multiple workups in the past.  We will      not repeat those.  We will consider doing a CT of abdomen and pelvis if      her symptoms are not resolving easily.  2. Bipolar disorder.  We will hold the patient's medications recently.      Some of these medications may be responsible for her recurrent      pancreatitis.  Will resume these medicine as soon as her oral intake      ensures.  3. UTI.  I will start the patient on some IV ciprofloxacin for now.   Otherwise, will continue to monitor the patient in the hospital, and Dr.  Gerda Diss should be back Monday to assume care.      Lonia Blood, M.D.  Electronically Signed     LG/MEDQ  D:  06/29/2006  T:  06/30/2006  Job:  [id number]
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
## 2011-02-23 NOTE — H&P (Signed)
   NAME:  [name] [name], [name] [name]                          ACCOUNT NO.:  [id number]   MEDICAL RECORD NO.:  [id number]                   PATIENT TYPE:  INP   LOCATION:  A328                                 FACILITY:  [facility]   PHYSICIAN:  Scott A. Gerda Diss, M.D.               DATE OF BIRTH:  07/13/1971   DATE OF ADMISSION:  01/01/2003  DATE OF DISCHARGE:                                HISTORY & PHYSICAL   CHIEF COMPLAINT:  Abdominal pain.   HISTORY OF PRESENT ILLNESS:  This is a 40 year old white female who has had  intermittent problems with pancreatitis presents today with significant  abdominal pain and discomfort to the emergency department along with  vomiting.  Amylase was mildly elevated at 200.  The patient did not have any  high fevers, glucose was normal, white count was normal.  It was felt that  the patient could be admitted and treated here.   PAST MEDICAL HISTORY:  1. Pancreatic pseudocyst.  2. Chronic pancreatitis.  3. Gastroesophageal reflux disease.  4. Bipolar disorder.   IMAGING STUDIES:  A previous CT scan of the abdomen August 12, 2002 with a  pseudocyst drainage from 6.1 all the way down to 3.6 in November of 2003.   CURRENT MEDICATIONS:  Her current medications are difficult to discern but  she relates Prozac 40 mg daily, Seroquel 200 mg one b.i.d., and Pancrease  three with each meal.   FAMILY HISTORY:  Heart disease.   SOCIAL HISTORY:  Denies smoking.   ALLERGIES:  REGLAN.   REVIEW OF SYSTEMS:  Per above.   PHYSICAL EXAMINATION:  GENERAL:  NAD, looks to feel ill.  HEENT:  Benign.  LUNGS:  Clear.  HEART:  Regular.  ABDOMEN:  Soft, tenderness in the epigastrium region.  EXTREMITIES:  No edema.    ASSESSMENT/PLAN:  Pancreatitis--we will follow the numbers closely.  If  numbers spike up significantly, she may need to have further testing done to  look for a recurrence of her pseudocyst.  Also have been in contact with Dr.  Wyatt Haste and he feels that conservative  management probably could be done  unless her numbers get out of hand.                                               Scott A. Gerda Diss, M.D.    SAL/MEDQ  D:  01/03/2003  T:  01/03/2003  Job:  [id number]

## 2011-02-23 NOTE — Discharge Summary (Signed)
NAME:  [name] [name], [name] [name]                          ACCOUNT NO.:  [id number]   MEDICAL RECORD NO.:  [id number]                   PATIENT TYPE:  INP   LOCATION:  A326                                 FACILITY:  [facility]   PHYSICIAN:  Donna Bernard, M.D.             DATE OF BIRTH:  1971-06-11   DATE OF ADMISSION:  06/08/2004  DATE OF DISCHARGE:  06/12/2004                                 DISCHARGE SUMMARY   FINAL DIAGNOSES:  1.  Acute pancreatitis.  2.  Bipolar disease.   DISPOSITION:  1.  The patient discharged to home.  2.  The patient to maintain usual chronic medicines plus hydrocodone, 5 mg      one to two q.4-6h. p.r.n. for pain.  3.  Bland diet for the next several days.   FOLLOWUP:  In the office in one week with Dr. Lacretia Nicks. Simone Curia.   INITIAL HISTORY AND PHYSICIAN:  Please see H&P as dictated.   HOSPITAL COURSE:  This patient is a 40 year old white female who developed  severe epigastric pain on the day of admission.  Lipase is elevated.  CBC  was normal.  The patient has a longstanding history of recurrent  pancreatitis.  She was admitted to the hospital for IV fluids and IV pain  control.  Over the next several days, the patient slowly improved.  On the  day of discharge, she was feeling better and sent home.   DIAGNOSES/DISPOSITION:  As noted above.     ___________________________________________                                         Donna Bernard, M.D.   WSL/MEDQ  D:  06/22/2004  T:  06/22/2004  Job:  [id number]

## 2011-02-23 NOTE — H&P (Signed)
[facility]  Patient:    [name] [name], [name] [name] Visit Number: [id number] MRN: [id number]          Service Type: MED Location: ICCU NF62 [id number] Attending Physician:  Annamarie Dawley. Dictated by:   Elfredia Nevins, M.D. Admit Date:  11/30/2001                           History and Physical  DATE OF BIRTH:  Oct 12, 1970  CHIEF COMPLAINT:  Overdose.  HISTORY OF PRESENT ILLNESS:  The patient has had some psychological stressors with family difficulties that prompted her to go to the emergency department for a "panic attack."  She received Ativan in the emergency department and was discharged in stable condition.  Shortly after that, she telephoned me on call and requested narcotic analgesic, which was refused, in the emergency department for a "migraine headache."  I refused to do this and advised that if she were hurting, she could take one Demerol 50 mg tablet, which she stated she had at home, and if failure to achieve relief, go to the emergency department of her choice.  Shortly after that, she returned to [facility] emergency department stating that she overdosed on several substances.  She had gastric decontamination and charcoal instilled.  Her laboratory studies revealed an alcohol level of 11, opiate- and benzodiazepine-positive urine drug screen and acetaminophen level of 16.3, electrolytes were fine, salicylate level less than 4, CBC was normal.  EKG showed sinus rhythm, within normal limits, without any Q-T prolongation or widening of QRS complex.  She remained what appears to be possibly histrionic, lethargic and inappropriate behavior and was admitted for observation and monitoring of airway.  It should be noted her arterial blood gas was normal in spite of her apparent obtundation.  She is admitted to the ICU for elopement and suicide precautions as well as airway monitoring.  Behavioral [facility] will be consulted. Dictated by:   Elfredia Nevins,  M.D. Attending Physician:  Annamarie Dawley DD:  12/01/01 TD:  12/01/01 Job: [id number]

## 2011-02-23 NOTE — Discharge Summary (Signed)
   NAME:  [name] [name], [name] [name]                          ACCOUNT NO.:  [id number]   MEDICAL RECORD NO.:  [id number]                   PATIENT TYPE:   LOCATION:                                       FACILITY:  [facility]   PHYSICIAN:  Donna Bernard, M.D.             DATE OF BIRTH:  [birth year]   DATE OF ADMISSION:  DATE OF DISCHARGE:  05/20/2002                                 DISCHARGE SUMMARY   FINAL DIAGNOSES:  1. Acute pancreatitis.  2. Pancreatic pseudocyst.  3. History of bipolar disease.   FINAL DISPOSITION:  The patient transferred to [facility].   INITIAL HISTORY AND PHYSICAL:  Please see H&P as dictated per Dr. Milford Cage.   HOSPITAL COURSE:  This patient is a 40 year old female with history of  recurrent pancreatitis and bipolar disease.  She presented to the hospital  on the day of admission with complaints of severe abdominal pain.  Her blood  work revealed elevated amylase and lipase with slightly elevated white blood  count.  The patient was admitted to the hospital.  She was given IV fluids.  She was made n.p.o.  She was given IV pain medication.  The GI folks were  consulted.  They went ahead and recommended an EGD.  This revealed some  extrinsic compression esophagus or rather some on the stomach.  They went  ahead and did a CT scan at this point resulting very large pseudocyst.  Case  was discussed amongst the GI folks and the patient was felt to be best  suited for a transfer to a subspecialist gastroenterologist who could  attempt to drain the pseudocyst in a percutaneous fashion.  The patient was  agreeable to this.  On May 20, 2002 she was transferred to [facility] with  diagnoses and disposition as noted above.                                               Donna Bernard, M.D.    [name]  D:  09/19/2002  T:  09/19/2002  Job:  [id number]

## 2011-02-23 NOTE — Discharge Summary (Signed)
[name], [name] [name]                            ACCOUNT NO.:  [id number]   MEDICAL RECORD NO.:  [id number]                  PATIENT TYPE:   LOCATION:                                       FACILITY:   PHYSICIAN:  Donna Bernard, M.D.             DATE OF BIRTH:   DATE OF ADMISSION:  DATE OF DISCHARGE:  04/17/2004                                 DISCHARGE SUMMARY   FINAL DIAGNOSES:  1. Acute pancreatitis.  2. Chronic recurrent pancreatitis.  3. Bipolar disease.   DISPOSITION:  1. Patient discharged to home.  2. Low fat diet.  3. Follow up in the office in a week.   DISCHARGE MEDICATIONS:  1. Oxycodone as needed for pain.  2. Pancrease 3 with meals and over the next week.   INITIAL HISTORY AND PHYSICAL:  Please see H&P as dictated.   HOSPITAL COURSE:  This patient is a 40 year old white female with a history  of recurrent pancreatitis and chronic bipolar disease along with glucose  intolerance.  She presented to the emergency room on the day of admission  with complaints of severe abdominal pain.  She had slightly elevated lipase.  She is in the midst of workup with the folks at [facility] due to her  chronic recurrent pancreatitis.  The patient was admitted to the hospital.  She was given IV fluids. She was placed on IV Dilaudid at 3 mg q.3h. p.r.n.  for pain along with Phenergan 25 mg IV q.4h. p.r.n. for nausea.  The patient  claimed to me, even after repeated questioning; her lithium dose is 30 mg 2  in the morning and 1 in the evening.  The following day the patient  experienced a hypopnea spell.  Her breathing became so she was very hard to  arouse.  She was admitted to the ICU.  Her oxygenation was low at this time.  This was similar to a spell she had had when utilizing a patient  administered pump for analgesia on her prior hospitalization.  On further  questioning she does snore a lot at nighttime, and does have some daytime  drowsiness.  This may represent sleep  apnea, and she may be prone to  excessive sedation with pain medicine and nausea medication.   The family was initially unhappy when they heard that she had received a  lithium dose of 300, 200 in the morning and 100 in the evening. However, our  pharmacist explored this and that is the patient's actual dose.  Therefore,  she was mistaken in what dose she is on and actually received a totally  appropriate dose.  I am concerned that this patient comes in with severe  pain; and yet, appears to be overly sensitive from the patient's standpoint,  as far as pain and nausea medicine. This will create an ongoing challenge.   At any rate, over the next several days the patient gradually  improved.  Her  pancreatic enzymes normalized.  We added Pancrease for improved absorption.  On the day of discharge the patient was feeling better and discharged home  with the diagnosis and disposition as noted above.   ADDENDUM TO HISTORY:  Hypersensitivity to pain medicine and nausea medicine  with a hypopnea spell requiring admission to the ICU.     ___________________________________________                                         Donna Bernard, M.D.   WSL/MEDQ  D:  04/26/2004  T:  04/26/2004  Job:  [id number]

## 2011-02-23 NOTE — Procedures (Signed)
   NAME:  [name] [name], [name] [name]                          ACCOUNT NO.:  [id number]   MEDICAL RECORD NO.:  [id number]                   PATIENT TYPE:  OBV   LOCATION:  A216                                 FACILITY:  [facility]   PHYSICIAN:  Edward L. Juanetta Gosling, M.D.             DATE OF BIRTH:  Apr 15, 1971   DATE OF PROCEDURE:  10/04/2002  DATE OF DISCHARGE:                                EKG INTERPRETATION   PROCEDURE:  EKG.   INTERPRETATION:  The rhythm is a sinus rhythm with a tachycardic rate of  about 130.                                               Edward L. Juanetta Gosling, M.D.    ELH/MEDQ  D:  10/05/2002  T:  10/05/2002  Job:  [id number]

## 2011-02-23 NOTE — Discharge Summary (Signed)
NAMETENEIL, SHILLER                ACCOUNT NO.:  [id number]   MEDICAL RECORD NO.:  [id number]          PATIENT TYPE:  INP   LOCATION:  A303                          FACILITY:  [facility]   PHYSICIAN:  Donna Bernard, M.D.DATE OF BIRTH:  [birth year]   DATE OF ADMISSION:  08/10/2007  DATE OF DISCHARGE:  11/06/2008LH                               DISCHARGE SUMMARY   FINAL DIAGNOSIS:  Flare of pancreatitis.   DISPOSITION:  1. The patient discharged home.  2. Discharge medications:  Oxycodone one every 4-6 hours as needed for      significant pain.  3. Advance diet slowly, low-fat diet recommended.   INITIAL H&P:  Please see H&P as dictated.   HOSPITAL COURSE:  This patient is a 40 year old female with history of  recurrent pancreatitis.  She presented to hospital the day of admission,  August 10, 2007, and found to be experiencing a flare of her  pancreatitis.  Her lipase was 632.  Her amylase was 169.  The patient  clearly had significant pain with this.  She was admitted to hospital.  She was started on IV pain meds, IV fluids.  The next several days, the  patient had a pretty difficult time.  She developed significant reactive  airways.  We went ahead and treated this with nebulizer therapy.  Finally, on August 14, 2007, the patient's abdominal pain had improved  considerably and she was discharged home with diagnoses and disposition  as noted above.      Donna Bernard, M.D.  Electronically Signed     WSL/MEDQ  D:  09/23/2007  T:  09/23/2007  Job:  [id number]

## 2011-02-23 NOTE — Discharge Summary (Signed)
NAME:  [name] [name], [name] [name]                          ACCOUNT NO.:  [id number]   MEDICAL RECORD NO.:  [id number]                   PATIENT TYPE:  INP   LOCATION:  A329                                 FACILITY:  [facility]   PHYSICIAN:  Donna Bernard, M.D.             DATE OF BIRTH:  1971-06-21   DATE OF ADMISSION:  08/10/2003  DATE OF DISCHARGE:  08/15/2003                                 DISCHARGE SUMMARY   FINAL DIAGNOSES:  1. Acute pancreatitis.  2. Chronic recurrent pancreatitis.  3. History of intentional overdose.  4. Bipolar disorder.   FINAL DISPOSITION:  Patient discharged home.   DISCHARGE MEDICATIONS:  1. Protonix 40 mg daily.  2. Duragesic patch one every three days.  3. Oxycodone 5 mg rescue dose as needed.  4. Pancrease supplements three before meals and two before snacks.   FOLLOWUP:  1. Follow up in my office in one week.  2. Follow up with specialist at [facility] as scheduled by Dr. Patty Sermons office.   INITIAL HISTORY AND PHYSICAL:  Please see H&P as dictated.   HOSPITAL COURSE:  This patient is a 40 year old white female with a history  of chronic recurrent pancreatitis.  She presented to the office on the day  prior to admission with complaints of mild epigastric discomfort.  She did  have some nausea.  The patient was re-presented to the emergency room with  epigastric pain.  It was progressive in nature.  Her initial pancreatic  enzymes were normal, upon presentation; however, upon repeat they were found  to be significantly elevated.  Please see chart for further details.  The GI  folks were consulted.  The patient was given IV pain medicine, IV fluids.  Her amylase reached a peak of 1,078, lipase a peak of 1282.  The last 72  hours in the hospital her enzymes dropped down to near normal.  The patient  had minimal abdominal pain.  Her hunger returned.  She was discharged home  with a diagnosis and disposition as noted above.     ___________________________________________                                         Donna Bernard, M.D.   WSL/MEDQ  D:  09/24/2003  T:  09/24/2003  Job:  [id number]

## 2011-02-23 NOTE — Consult Note (Signed)
NAME:  [name] [name], [name] [name]                          ACCOUNT NO.:  [id number]   MEDICAL RECORD NO.:  [id number]                   PATIENT TYPE:  INP   LOCATION:  A329                                 FACILITY:  [facility]   PHYSICIAN:  Lionel December, M.D.                 DATE OF BIRTH:  Sep 23, 1971   DATE OF CONSULTATION:  08/11/2003  DATE OF DISCHARGE:                                   CONSULTATION   CONSULTING PHYSICIAN:  Lionel December, M.D.   REFERRING PHYSICIAN:  Donna Bernard, M.D.   REASON FOR CONSULTATION:  Pancreatitis.   HISTORY OF PRESENT ILLNESS:  [name] [name] is a 40 year old Caucasian female who  was admitted to Dr. Zack Seal service yesterday with acute onset of  epigastric and left upper quadrant pain associated with nausea.  The patient  was felt to have recurrent pancreatitis.  [name] [name] was hospitalized just two  weeks ago for similar symptomatology.  [name] [name] improved, and [name] [name] states that [name] [name]  was completely pain free for a few days before this episode started.   On admission, her amylase was 90, lipase was 52 but this morning amylase is  up to 243 and lipase is 304.  [name] [name] does not feel any better.  [name] [name] denies  fever, chills, vomiting, melena or rectal bleeding.  [name] [name] recalled that [name] [name]  ate a breakfast.  [name] [name] had a sausage biscuit, and [name] [name] got sick within a few  minutes.  The patient states that [name] [name] has been hospitalized 5-6 times with  pancreatitis this year.   Review of the records reveal that [name] [name] was in the hospital in March, July and  more recently in October.  [name] [name] first episode of pancreatitis was after  [name] [name] had a cholecystectomy in 2001.  [name] [name] was hospitalized in August last year  with pancreatitis and complicated by pseudocyst.  [name] [name] was sent over to  [facility] .  [name] [name] was evaluated by Dr. Angelina Pih.  [name] [name] had ERCP on May 22, 2002.  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  [name] [name] also had angulation and possible  obstruction to the pancreatic duct at the junction of the head and body.  [name] [name] had a distal gastrostomy.  The patient had a repeat procedure three days  later and had a second stent placed.  [name] [name] had repeat ERCP on June 19, 2002.  Her CBD was normal, and [name] [name] had PD obstruction at the level of genu.  [name] [name] had pancreatic sphincterotomy.  [name] [name] had EUS in September 2003.  [name] [name] had  another cyst away from the gastroduodenal wall and this apparently was  treated percutaneously according to the patient.  [name] [name] was last seen by Dr.  Angelina Pih in January 2004.  [name] [name] was also evaluated by Dr. Boykin Peek who  felt the surgery may not help her pancreatitis.  The patient denies weight  loss this year.  [name] [name] does not drink alcohol.   MEDICATIONS:  Her  usual medications at home are Seroquel, Sonata.  [name] [name] was  begun on another antidepressant recently but does not remember the name.  For the last few days [name] [name] has been on Pancrease three with each meal and one  with snack.   [name] [name] is presently on Protonix 40 mg IV every 24 hours, morphine sulfate via  PCA pump, Zofran 8 mg q.12h., Seroquel 600 mg q.h.s., Restoril 15 mg q.h.s.  P.r.n. medications include:  Dilaudid, prochlorperazine and promethazine.   PAST MEDICAL HISTORY:  1. [name] [name] has a history of depression.  2. Bipolar disorder.  3. [name] [name] is also treated for drug overdose within the last two years by Dr.     Simone Curia.  [name] [name] feels her illness is well controlled with therapy.  4. [name] [name] has a history of GERD.  5. [name] [name] had lap Nissen at the time of cholecystectomy in 2001.  6. [name] [name] has had a C-section.  7. History of pancreatitis without definite etiology, reviewed above.  [name] [name]     did have a CT two weeks ago which showed changes of pancreatitis.  No     evidence of necrotizing pancreatitis or pseudocyst.  Pancreatic duct was     not felt to be dilated.   ALLERGIES:  REGLAN which caused lock jaw.   FAMILY HISTORY:  Mother has fibromyalgia and father is diabetic.  [name] [name] has  one sister in good health.   SOCIAL HISTORY:  [name] [name] is  married.  [name] [name] has two children.  [name] [name] is on  disability.  [name] [name] does not smoke cigarettes or drink alcohol.   PHYSICAL EXAMINATION:  GENERAL:  Mildly obese Caucasian female, does not  appear to be in any acute distress.  VITAL SIGNS:  Admission weight 190 pounds.  [name] [name] is 5 feet 6 inches tall.  Pulse 100 per minute, blood pressure 102/56, respiratory rate is 18 and temp  is 97.6.  HEENT:  Conjunctivae pink.  Sclerae nonicteric.  Oropharyngeal mucosa is  normal.  NECK:  Without masses or thyromegaly.  CARDIAC:  Regular rhythm.  Normal S1, S2.  No murmur or gallop noted.  LUNGS:  Clear to auscultation.  ABDOMEN:  Protuberant.  Bowel sounds are normal.  Palpation reveals mild to  moderate tenderness in the mid epigastrium and below LCM.  Liver edge is  palpable 2- to 3-cm below the RCM.  Does not appear to be tender.  No masses  noted.  RECTAL:  Deferred.  EXTREMITIES:  [name] [name] does not have clubbing or peripheral edema.   LABS ON ADMISSION:  WBC was 7.7, H&H 13.9 and 41.5, platelet count 301 K.  Her sodium is 136, potassium 4, chloride 107, CO2 24, glucose 94, BUN 10,  creatinine 0.6.  Bilirubin 0.5, AP 60, AST 39, ALT 52.  Albumin 4.0.  Calcium is 9.1.  Serum amylase and lipase as above.  H&H from this morning  11.6 and 35.9.  WBC was 6.9.   ASSESSMENT:  [name] [name] is a 40 year old Caucasian female with recurrent  episodes of pancreatitis.  Apparently [name] [name] is asymptomatic between these  episodes but lately these have been occurring at intervals of every two  weeks to three months.  While primary etiology of her pancreatitis is  unclear, [name] [name] does appear to have angulation/stricture involving the  pancreatic duct at the level of genu and this may be am unable to endoscopic  therapy.  I feel that [name] [name] needs to have the pancreatic duct studied while  ERCP and stented if feasible and we fail  to change her clinical course.  I have reviewed the case with Dr. Lucrezia Starch of GI division at [facility].  He   was kind enough to review all of the records with me over the phone and  agrees that repeat ERCP with therapeutic intervention if feasible would be  appropriate.  However, he would wait until her acute episode subsides.   [name] [name] does not appear to be acutely toxic.  I do not feel that [name] [name] has any  complications of pancreatitis and, therefore, do not feel that the CT needs  to be repeated.    RECOMMENDATIONS:  1. I feel we should continue with present therapy consisting of NPO status,     IV fluids, IV acid suppression, and analgesia.  Hopefully [name] [name] will be     able to go on a liquid diet within the next day or two.  2. I will arrange for her to see Dr. Gwinda Passe within the next 2-3 weeks for     further evaluation of her recurrent bouts of pancreatitis and possibly     pancreatic ductal stricture.  3. As for the patient's mildly elevated transaminases are concerned, I feel     that these are due to steatohepatitis.  These will be repeated within the     next few days.   I would like to thank Dr. Donna Bernard for the opportunity to  participate in the care of this nice lady.      ___________________________________________                                            Lionel December, M.D.   NR/MEDQ  D:  08/11/2003  T:  08/11/2003  Job:  [id number]

## 2011-02-23 NOTE — Discharge Summary (Signed)
   NAME:  [name] [name], [name] [name]                          ACCOUNT NO.:  [id number]   MEDICAL RECORD NO.:  [id number]                   PATIENT TYPE:  INP   LOCATION:  A304                                 FACILITY:  [facility]   PHYSICIAN:  Donna Bernard, M.D.             DATE OF BIRTH:  [birth year]   DATE OF ADMISSION:  04/18/2003  DATE OF DISCHARGE:  04/24/2003                                 DISCHARGE SUMMARY   FINAL DIAGNOSES:  1. Acute pancreatitis.  2. History of chronic recurrent pancreatitis.  3. Bipolar disease.   FINAL DISPOSITION:  The patient discharged to home.   DISCHARGE MEDICATIONS:  1. Ultram 50 mg 1 q.4-6 hours as needed for moderate pain.  2. Dilaudid 1 every 6 hours as needed for severe pain, #24.  3. Prozac 40 mg, the patient to resume and stay on.   FOLLOW UP:  1. Follow up in our office in 2 weeks.  2. Follow up at social services.  3. Follow up with mental health [facility].   INITIAL HISTORY AND PHYSICAL:  Please see H&P as dictated.   HOSPITAL COURSE:  The patient is a 40 year old white female with history of  recurrent pancreatitis who presented to the hospital on the day of admission  with complaints of severe epigastric discomfort.  Her blood work revealed  significant elevation of both amylase and lipase.  The patient was placed on  a PCA pump, morphine.  She was given IV fluids.  She was made initially NPO.  The next several days, the patient did not improve as quickly as hoped.  Due  to ongoing symptoms despite in improvement in enzymes, we to go ahead and do  a CT scan.  The patient did have a history of pseudocyst in the past.  CT  scan effectively ruled out cyst.  The day of discharge the patient was  improved and stable clinically and discharged home with diagnoses and  disposition shown above.                                                 Donna Bernard, M.D.    [name]  D:  05/17/2003  T:  05/17/2003  Job:  [id number]

## 2011-02-23 NOTE — H&P (Signed)
[facility]  Patient:    [name] [name], [name] [name] Visit Number: [id number] MRN: [id number]          Service Type: MED Location: 3A A302 01 Attending Physician:  Cassell Smiles. Dictated by:   Patrica Duel, M.D. Admit Date:  10/26/2001                           History and Physical  CHIEF COMPLAINT:  Abdominal pain.  HISTORY OF PRESENT ILLNESS:  This is a 40 year old female with a long-standing history of irritable bowel syndrome.  She is status post cholecystectomy and laparoscopic fundoplication approximately one year ago.  Her postoperative course was apparently complicated by a brief episode of pancreatitis.  She has been seen by the GI service on several occasions.  The patient presented to the emergency department with a 12-hour history of increasingly severe epigastric abdominal pain radiating to her back.  She denied vomiting but had been somewhat nauseated.  She also denies diarrhea, melena, hematemesis, or hematochezia.  An ER workup was benign except for a lipase mildly elevated at 125.  Amylase, liver functions, and CBC were perfectly normal.  She is admitted for increasingly severe abdominal pain, questionable etiology.  There is no history of headache, neurologic deficits, chest pain, shortness of breath, cough, fever, chills, or genitourinary symptoms.  CURRENT MEDICATIONS: 1. Paxil CR 25 mg q.d. 2. Seroquel. 3. Xanax. 4. Demerol.  ALLERGIES:  REGLAN.  PAST MEDICAL HISTORY:  As noted above.  She also has a history of migraine headaches.  SOCIAL HISTORY:  Nonsmoker, nondrinker.  FAMILY HISTORY:  Noncontributory.  REVIEW OF SYSTEMS:  Negative except as mentioned.  PHYSICAL EXAMINATION:  GENERAL:  Very pleasant female in no acute distress.  VITAL SIGNS:  On presentation temperature was 98.2, pulse 111, respirations 20, blood pressure 128/79.  HEENT:  Normocephalic, atraumatic.  Pupils are equal.  There is no scleral icterus.   Ears, nose, throat benign.  NECK:  Supple.  There are no masses noted.  LUNGS:  Clear to A&P.  HEART:  Heart sounds are normal, without murmurs, rubs, or gallops.  ABDOMEN:  Soft.  There is moderate epigastric tenderness with some rebound, but no significant guarding.  Bowel sounds are intact.  EXTREMITIES:  No clubbing, cyanosis, or edema.  NEUROLOGIC:  Within normal limits.  PERTINENT LABORATORY DATA:  As noted above.  ASSESSMENT:  Abdominal pain, question recurrent pancreatitis.  PLAN:  Analgesia.  GI consult.  Will follow and treat expectantly. Dictated by:   Patrica Duel, M.D. Attending Physician:  Cassell Smiles DD:  10/26/01 TD:  10/26/01 Job: [phone] [id number]

## 2011-02-23 NOTE — Consult Note (Signed)
NAME:  [name] [name], [name] [name]                          ACCOUNT NO.:  [id number]   MEDICAL RECORD NO.:  [id number]                  PATIENT TYPE:   LOCATION:                                       FACILITY:   PHYSICIAN:  Donna Bernard, M.D.             DATE OF BIRTH:  05/14/1976   DATE OF CONSULTATION:  09/01/2003  DATE OF DISCHARGE:                                   CONSULTATION   HISTORY OF PRESENT ILLNESS:  I was called to the emergency room by Dr.  Colon Branch in regard to [facility],  a patient of our practice. She has a  history of significant chronic pancreatitis with recurrent flares.  Recently  she has had a significant number of flares.  She has been having some  chronic pain with this and currently has been on Duragesic patches 100 mcg  per hour.  She also uses Phenergan 25 mg q.4-6h. p.r.n. for nausea.  In  addition, she is on several medications from her psychiatrist including  Seroquel 600 mg daily, Sonata 20 mg daily, and an unspecified  antidepressant.   The patient states that the last several days she has had significant  epigastric pain.  She has had 3 episodes of vomiting.  Last night was  difficult, she had a lot of pain.  Of note, in her prior admissions to the  hospital over the past couple of months she has been worked up for  complications with scans showing no recurrent of pseudocyst.   PRIOR MEDICAL HISTORY:  Significant for prior suicide attempt and known  bipolar disease, chronic anemia, history of kidney stones.   FAMILY HISTORY:  Positive for coronary artery disease.   ALLERGIES:  Does have a reaction to REGLAN.   SOCIAL HISTORY:  The patient is married with 2 children, no tobacco or  alcohol use.   PHYSICAL EXAMINATION:  VITAL SIGNS:  Stable, afebrile.  GENERAL:  Pleasant, clearly in distress.  HEENT:  Normal.  NECK:  Supple.  LUNGS:  Clear.  HEART:  Regular rate and rhythm.  ABDOMEN:  Significant epigastric tenderness no rebound or guarding.   LABS:  Amylase high.  Lipase high.  Please see chart.  Other labs pending.   IMPRESSION:  Acute pancreatitis.  Since this is her third bout in the last 5  weeks and she was due to see the specialist about this anyway, I really  would prefer that she would be transferred down to [facility] for evaluation by  there pancreas specialist.  The patient agrees with this.  I spoke with the  specialist at [facility] who kindly will accept her in transfer.   PLAN:  Transfer to [facility] today.      ___________________________________________  Donna Bernard, M.D.   [name]  D:  09/08/2003  T:  09/09/2003  Job:  [id number]

## 2011-02-23 NOTE — Discharge Summary (Signed)
NAME:  [name] [name], [name] [name]                            ACCOUNT NO.:  [id number]   MEDICAL RECORD NO.:  [id number]                   PATIENT TYPE:  INP   LOCATION:                                       FACILITY:  [facility]   PHYSICIAN:  Vilinda Blanks. Gerda Diss, M.D.             DATE OF BIRTH:  [birth year]   DATE OF ADMISSION:  04/28/2002  DATE OF DISCHARGE:  05/07/2003                                 DISCHARGE SUMMARY   FINAL DIAGNOSES:  1. Intentional overdose.  2. Near-pulmonary failure secondary to Seroquel and Lithium overdose.  3. Dialysis x1 due to Lithium toxicity.  4. History of bipolar disease with prior history of multiple suicide     attempts.  5. Pneumonic infiltrate, clinically improved.   FINAL DISPOSITION:  The patient transferred to a psychiatric facility for  further psychiatric management.   DISCHARGE MEDICATIONS:  1. Xanax 1 mg q.i.d.  2. Augmentin 875 mg one p.o. b.i.d.   HOSPITAL COURSE:  This patient is a 32ish-year-old white female with history  of bipolar disease and prior suicide attempts who presented to the emergency  room the date of admission in severe distress.  She was virtually  unresponsive.  She was tachypneic.  Her blood gas confirmed hypocapnia with  a pCO2 in the mid 70s, her pO2 was in the 60s.  The patient had aggressive  attempts in the emergency room to oxygenation.  In the ER, we decided not to  intubate the patient when a repeat blood gas returned with a pCO2 down in  the mid 40s.  The patient was given charcoal.  She was admitted to the  intensive care unit.  Her history was of having taken thirty 100 mg  Seroquels.  The patient was given IV fluids.  Lithium level was drawn.  Lithium level returned the next day.  It was high at 4.5.   Based on this and her ongoing neurological symptoms of minimal  responsiveness, the renal folks were consulted and they performed dialysis  x1.  The followup Lithium showed a result down in the 1s.  Due to bilateral  infiltrates, the patient was put on IV antibiotics, Rocephin and  clindamycin, for potential aspiration pneumonitis.  The patient had  significant fever early on.  Part of this could have been an anticholinergic  syndrome of fever, tachycardia, and dilated pupils potentially secondary to  the Seroquel.  The patient was given several doses of physostigmine IV for  this with some improvement of her symptoms.  Over the last few days, the  patient has gradually improved.  Her mentation has improved.  She is eating  and drinking on her own well.  We stopped the IV 24 hours ago.  At this  point, she is medically safe for transfer.  She may indeed still have some  residual neurological sequelae of the Lithium overdose.  Only time will tell  and  thus physical therapy has assessed her and found her just to be globally  a bit weak and shaky.  When psychiatric referral is obtained, the patient  will be transferred in that direction with diagnosis and disposition as  noted.                                               Vilinda Blanks. Gerda Diss, M.D.   [name]  D:  05/06/2002  T:  05/06/2002  Job:  [id number]

## 2011-02-23 NOTE — H&P (Signed)
NAME:  [name] [name], [name] [name]                          ACCOUNT NO.:  [id number]   MEDICAL RECORD NO.:  [id number]                   PATIENT TYPE:  INP   LOCATION:  A304                                 FACILITY:  [facility]   PHYSICIAN:  Donna Bernard, M.D.             DATE OF BIRTH:  Feb 24, 1971   DATE OF ADMISSION:  04/18/2003  DATE OF DISCHARGE:                                HISTORY & PHYSICAL   CHIEF COMPLAINT:  Abdominal pain.   SUBJECTIVE:  This patient is a 40 year old white female with history of  recurrent pancreatitis who presents to the hospital the day of admission  with complaints of severe epigastric pain.  She was in her usual state of  fair health until approximately 24 hours prior to admission.  At that point  she developed upper abdominal discomfort.  This was accompanied by nausea.  The patient had several episodes of near vomiting.  Her appetite diminished.  The day of admission the pain had progressed to fairly severe and she came  on into the emergency room.  The patient has had a prior pseudocyst  development and in fact required a drainage in November 2003.  Prior medical  history also significant for reflux disease and bipolar disorder.  The  patient is currently disabled.   CURRENT MEDICATIONS:  The patient states she has run out of all of her  medications but up until recently:  1. Protonix 40 mg daily.  2. Prozac 40 mg daily.  3. Phenergan 25 mg q.4-6h. p.r.n. for nausea.   PRIOR SURGERIES:  1. Pseudocyst drainage in 2003.  2. Remote cesarean section.  3. Cholecystectomy in November 2001.   FAMILY HISTORY:  Positive for coronary artery disease.   ALLERGIES:  REGLAN.   SOCIAL HISTORY:  The patient is married with two children.  No alcohol or  tobacco abuse.   REVIEW OF SYSTEMS:  Otherwise negative.   PHYSICAL EXAMINATION:  VITAL SIGNS:  Blood pressure 137/80, afebrile, pulse  110, respiratory rate 18.  HEENT:  Normal.  NECK:  Supple.  LUNGS:   Clear.  HEART:  Regular rhythm.  ABDOMEN:  Distinct epigastric tenderness.  No rebound, some guarding.  Bowel  sounds present.  EXTREMITIES:  Normal.  SKIN:  No lesions.  NEUROLOGIC:  Intact.   SIGNIFICANT LABORATORY DATA:  Amylase and lipase up - see chart.  Chest x-  ray and abdominal x-ray:  No acute disease.   IMPRESSION:  1. Acute pancreatitis.  2. History of pseudocyst.  3. Depression/anxiety with bipolar disease.   PLAN:  As per orders.                                               Donna Bernard, M.D.    WSL/MEDQ  D:  04/19/2003  T:  04/19/2003  Job:  [id number]

## 2011-02-23 NOTE — Discharge Summary (Signed)
[facility]  Patient:    [name] [name], [name] [name] Visit Number: [id number] MRN: [id number]          Service Type: MED Location: 3A A302 01 Attending Physician:  Cassell Smiles. Dictated by:   Patrica Duel, M.D. Admit Date:  10/26/2001 Discharge Date: 10/31/2001                             Discharge Summary  DISCHARGE DIAGNOSES: 1. Acute pancreatitis documented by enzymes, questionable etiology.  CT scan    revealing no common bile duct dilatation or other abnormalities. 2. Long-standing irritable bowel syndrome. 3. Status post cholecystitis and laparoscopic fundoplication approximately    one year prior to this admission.  Of note, this was complicated by an    episode of pancreatitis. 4. Anxiety and depression. 5. Questionable narcotic overuse.  HISTORY OF PRESENT ILLNESS:  For details of admission, please refer to admit note.  Briefly, this is a 40 year old female with the above history who presented to the emergency department with a 12-hour history of increasingly severe epigastric abdominal pain radiating to her back.  She had been nauseated but denied vomiting, diarrhea, melena, hematemesis, or hematochezia. Her lipase was mildly elevated at 125.  Of note, amylase, liver functions, and CBC were perfectly normal.  She was admitted for increasingly severe abdominal pain, probable pancreatitis.  HOSPITAL COURSE:  The patient was treated routinely with hydration and n.p.o. status and IV narcotics.  She requested narcotics practically every hour and was put on a PCA pump for several days.  Dr. Karilyn Cota was consulted and agreed with the current management.  He thought that possibly she may have constipation and was treated with GoLYTELY and enemas.  She did have results, but without relief of her abdominal pain.  Her amylase and lipase did increase markedly over the course of the next two days.  There was also mild elevation of her transaminases.  A CT  scan was performed which revealed no abnormalities or evidence of common duct obstruction.  An MRCP, magnetic resonance cholangiopancreatogram, was ordered although we were unable to have this scheduled until October 29, 2001.  Her symptoms improved, and she was taken off of the narcotics.  She experienced a 24-hour episode of some confusion and hallucinations, although this resolved. She became lucid, her symptoms resolved, and she was requesting discharge on the fifth hospital day.  She will be followed closely as an outpatient.  An ERCP is scheduled for November 03, 2001.  MEDICATIONS:  She is to continue her home medications, which include: 1. Paxil CR 25 q.d. 2. Seroquel at previous dose. 3. Xanax p.r.n. 4. Darvocet-N 100 p.r.n. 5. She is not to be given Demerol in the future.  We will follow and treat expectantly as an outpatient. Dictated by:   Patrica Duel, M.D. Attending Physician:  Cassell Smiles DD:  11/11/01 TD:  11/11/01 Job: [id number]

## 2011-02-23 NOTE — Discharge Summary (Signed)
   NAME:  [name] [name], [name] [name]                          ACCOUNT NO.:  [id number]   MEDICAL RECORD NO.:  [id number]                   PATIENT TYPE:  INP   LOCATION:  A329                                 FACILITY:  [facility]   PHYSICIAN:  Donna Bernard, M.D.             DATE OF BIRTH:  [birth year]   DATE OF ADMISSION:  07/20/2003  DATE OF DISCHARGE:  07/27/2003                                 DISCHARGE SUMMARY   FINAL DIAGNOSES:  1. Acute pancreatitis.  2. Bipolar disease stable.  3. History of esophagitis.   DISPOSITION:  The patient is discharged home.   DISCHARGE MEDICATIONS:  1. Seroquel 200 mg q.h.s.  2. Prilosec 20 mg daily, long term.  3. Duragesic patch taper over the next six days.  4. Pancrease supplement three capsules before meals over the next week.  5. Phenergan tablets as needed for nausea.   DIET:  Low-fat.   HISTORY AND PHYSICAL:  Please see H&P as dictated.   HOSPITAL COURSE:  This patient is a 40 year old white female with history of  recurrent pancreatitis felt to be primarily idiopathic who presented to the  emergency room the day of admission with complaints of severe abdominal  pain.  Amylase and lipase were only mildly elevated.  However, exam was  impressive showing significant epigastric tenderness.  She had had frequent  vomiting and appeared to be volume contracted.  We admitted her to the  hospital.  She was given IV fluids for rehydration and placed on a morphine  PCA.  Over the next 48 hours, the patient improved minimally, so we  initially did an ultrasound which showed no significant problems.  The  patient continued to have pain, so CT scan was ordered that showed no  pseudocyst or other complication.  The patient has gradually improved over  the last 48 hours.  His amylase and lipase had normalized.  At its peak,  both levels were up over 1000.  On the day of discharge, the patient still  had some abdominal pain but felt stable enough for discharge.   She is  discharged home.  Diagnosis and disposition as noted above.     ___________________________________________                                         Donna Bernard, M.D.   WSL/MEDQ  D:  07/27/2003  T:  07/27/2003  Job:  [id number]

## 2011-02-23 NOTE — Discharge Summary (Signed)
NAME:  [name] [name], [name] [name]                          ACCOUNT NO.:  [id number]   MEDICAL RECORD NO.:  [id number]                   PATIENT TYPE:  INP   LOCATION:  A336                                 FACILITY:  [facility]   PHYSICIAN:  Donna Bernard, M.D.             DATE OF BIRTH:  02/11/1971   DATE OF ADMISSION:  12/01/2003  DATE OF DISCHARGE:  12/03/2003                                 DISCHARGE SUMMARY   DISCHARGE DIAGNOSES:  1. Acute pancreatitis.  2. History of chronic recurrent pancreatitis.  3. Bipolar disease.   DISPOSITION:  The patient is discharged to home.  The patient is aware that  she is leaving somewhat early and may experience a reexacerbation requiring  readmission to the hospital.   DISCHARGE MEDICATIONS:  Ultram 50 mg one every four to six hours as needed  for pain.   FOLLOW UP:  Follow up in the office in a week for reevaluation.   HISTORY OF PRESENT ILLNESS:  Please see H&P as dictated.   HOSPITAL COURSE:  This patient is a 40 year old, white female with history  of recurrent pancreatitis who presented on the day of admission with  complaints of severe epigastric discomfort.  At that point, her lipase was  361 and amylase was 153.  The patient was admitted to the hospital.  She was  given IV fluids along with IV pain medications.  The following day, the  patient had a spell of cyanosis with apnea.  This accompanied the use of  Phenergan along with administration of morphine via the PCA.  I did have  some concern that the patient may be self-medicating because she has a  history of difficulty with medications.  Her purse was examined and she was  found to have some Xanax and this was removed.  We adjusted her pain control  back to nurse directed IV therapy.  The following day on February 25, the  enzymes were 355 and 321 with amylase and lipase respectively.  Of note, the  prior day the amylase had been 749 and the lipase 1451.  I really prefer to  keep the patient  in the hospital one more day, but she was insistent on  going home because she had to attend a dance for her daughter.  The patient  was discharged home with diagnoses and disposition as noted above.     ___________________________________________                                         Donna Bernard, M.D.   WSL/MEDQ  D:  01/09/2004  T:  01/10/2004  Job:  [id number]

## 2011-02-23 NOTE — H&P (Signed)
NAME:  [name] [name], [name] [name]                          ACCOUNT NO.:  [id number]   MEDICAL RECORD NO.:  [id number]                   PATIENT TYPE:   LOCATION:                                       FACILITY:  [facility]   PHYSICIAN:  Hanley Hays. Dechurch, M.D.           DATE OF BIRTH:  May 15, 1971   DATE OF ADMISSION:  08/10/2002  DATE OF DISCHARGE:                                HISTORY & PHYSICAL   HISTORY OF PRESENT ILLNESS:  The patient is a 40 year old Caucasian female  with a past medical history remarkable for recurrent pancreatitis who  actually has been followed at [facility] where she  underwent percutaneous drainage of a pseudocyst.  Drains were removed  approximately two weeks ago.  Approximately one week prior to admission the  patient developed left upper quadrant abdominal pain which gradually  progressed to the point that she was quite uncomfortable.  The pain radiated  through the back.  There was no associated nausea or vomiting.  It was worse  postprandial.  She denies any epigastric pain  precisely no pyrosis or  reflux-type symptoms.  Her p.o. intake has been limited.  She has had no  bowel habit changes.  She denies any GU complaints.  She denies any evidence  of orthostasis.  She presented to the hospital today because of unrelenting  pain.  The laboratory data revealed amylase 184, lipase 218.  Normal liver  function panel.  White count normal at 8.5, without a left shift.  She does  have some microcytosis and mild anemia.  Because of progressive abdominal  pain and pancreatitis, the patient is being admitted to the hospital for  bowel rest, IV fluids, and monitoring.   PAST MEDICAL HISTORY:  1. Bipolar disorder.  She states she has been doing well recently.  She has     a history of multiple  intentional overdoses, the most recent one being in July when she took  Seroquel and lithium and actually required dialysis and noninvasive  ventilatory support.  1. History of migraine headaches.  2. History of nephrolithiasis.  3. History of reflux, though no symptoms.  4. Status post cholecystectomy and Nissen fundoplication.  5. History of biliary stent placement and drainage of pseudocyst at [facility].  6. C-section x 2.   MEDICATIONS:  1. Xanax 1 mg p.[name].n.  She states she has not used any recently.  2. Ambien 10 mg q.h.s.  3. Paxil CR to mg daily.   ALLERGIES:  REGLAN causes question of dystonia and muscle stiffness.   FAMILY MEDICAL HISTORY:  Pertinent for grandfather with history of  pancreatitis, etiology was unclear though he died of heart failure.  Otherwise noncontributory.   SOCIAL HISTORY:  The patient has two children, ages 2 and 72, daughters who  are healthy.  She has close family support.  She is married.  Denies any  alcohol or tobacco abuse.  Denies any illicit drug use.   REVIEW OF SYSTEMS:  Other than above is essentially negative.  She has had  no new medications, which is pertinent as apparently a Abilify was  implicated in one of her previous episodes.   PHYSICAL EXAMINATION:  GENERAL:  Alert, pleasant, young white female in no  apparent distress.  Pain was relieved with second dose of morphine in the  emergency room.  VITAL SIGNS:  Blood pressure 130/70, pulse 74 and regular, respirations are  unlabored.  HEENT:  Oropharynx is moist.  Slightly pale.  NECK:  Supple.  No JVD, adenopathy, thyromegaly.  LUNGS:  Clear.  HEART:  Regular.  No murmur.  ABDOMEN:  Soft, with positive bowel sounds and diffuse tenderness,  particularly in the left upper quadrant.  No fluctuance or crepitus is  noted.  EXTREMITIES:  Without clubbing, cyanosis, or edema.  BREASTS:  Deferred.  GENITOURINARY:  Deferred.  RECTAL:  Deferred at this time as well.   ASSESSMENT AND PLAN:  Recurrent pancreatitis in a patient with a long  history of same.  Etiology has been unclear.  No evidence to suggest peptic  ulcer disease or decompensation at  this time.  Seems to be temporarily  related to having the drains removed, though unclear if this is the  etiology.  In any event, the patient will be admitted with IV fluids and  monitoring and comfort measures.  Will continue her current medical regimen.  I doubt this is playing a negative role.                                               Hanley Hays Josefine Class, M.D.    FED/MEDQ  D:  08/10/2002  T:  08/10/2002  Job:  [id number]

## 2011-02-23 NOTE — H&P (Signed)
[facility]  Patient:    [name] [name], [name] [name] Visit Number: [id number] MRN: [id number]          Service Type: MED Location: ICCU GU44 03 Attending Physician:  Rosalyn Charters Dictated by:   Donna Bernard, M.D. Admit Date:  04/28/2002                           History and Physical  CHIEF COMPLAINT:  Overdose.  HISTORY OF PRESENT ILLNESS:  The patient is unresponsive and cannot give a history.  However, her grandmother is with her and states that on the day of admission she was given Seroquel samples, approximately #40, by her psychiatrist.  The patient on into the afternoon apparently took 30-40 of these 100 mg Seroquels intentionally.  This is her third or fourth, by the familys estimate, suicidal attempt in the past year.  Compliance with current medications uncertain.  MEDICATIONS: 1. Neurontin 1000 mg t.i.d. 2. Paxil CR 50 mg daily. 3. Seroquel 200 mg q.h.s. 4. Alprazolam 1 mg q.i.d. 5. Lithium 300 mg b.i.d. 6. Ambien 15 q.h.s.  PAST MEDICAL HISTORY: 1. Kidney stones. 2. Esophageal ulcers. 3. Bipolar disease. 4. Recurrent migraine headaches.  PAST SURGICAL HISTORY: 1. C-sections. 2. Gallbladder surgery. 3. Nissen stomach repair remotely.  FAMILY HISTORY:  Positive for coronary artery disease and depression.  ALLERGIES:  REGLAN.  SOCIAL HISTORY:  The patient is separated.  Has two children.  REVIEW OF SYSTEMS:  Otherwise negative.  PHYSICAL EXAMINATION:  VITAL SIGNS:  BP 130/80.  The patient is afebrile.  Pulse rate 120, respiratory rate 48.  GENERAL:  The patient is unresponsive, with a nasal trumpet and nasal cannula O2 and an NG tube placed as I see her.  She will open her eyes briefly when spoken very loudly to.  The patient is snoring as she breathes.  ADDITIONAL NEUROLOGIC:  Minimal gag reflex noted.  Deep tendon reflexes minimal.  Babinski normal but minimal.  LUNGS:  Positive tachypnea noted.  No wheezes or  crackles.  HEART:  Tachycardic.  No significant murmurs.  ABDOMEN:  Large.  Bowel sounds present.  HEENT:  Pupils diminished reactivity.  TMs normal.  NECK:  Supple.  EXTREMITIES:  No edema.  No obvious deformities.  LABORATORY DATA:  MET-7:  Borderline low potassium.  Initial blood gas worrisome with a ______ .  Subsequent blood gas revealed pCO2 of 45 with a pO2 of 65 on 2 L nasal cannula O2.  Drug screen positive for benzodiazepines.  EKG:  QRS of 0.11.  No significant ST changes.  IMPRESSION: 1. Intentional overdose of Seroquel.  Patient having a serious response to her    overdose.  We monitored her in the emergency room with expectation of    potentially putting her on the ventilator.  This was discussed with the    family.  However, with repeat blood gas showing improvement of hypercapnia,    we elected to watch her closely in the ICU.  This was also discussed at    length.  At this point, no significant hypotension yet.  The patients    fluids have been liberalized as her systolic blood pressure has dropped    down towards 110.  EKG shows no ominous changes from the Seroquel.  The    family was advised of the seriousness of the situation, both in the    short-term and the long-term, in terms of recurrence, suicidal attempts  ______ bipolar disease.  The family expresses lack of contentment with    current psychiatrist. 2. Chronic bipolar disease. 3. Intermittent headaches. 4. Chronic reflux.  PLAN:  Admit to the ICU.  All orders as noted on the chart.  Another blood gas in two hours, with ICU to call me with results. Dictated by:   Donna Bernard, M.D. Attending Physician:  Rosalyn Charters DD:  04/29/02 TD:  05/03/02 Job: [id number]

## 2011-02-23 NOTE — Discharge Summary (Signed)
   NAME:  [name] [name], [name] [name]                          ACCOUNT NO.:  [id number]   MEDICAL RECORD NO.:  [id number]                   PATIENT TYPE:  INP   LOCATION:  A328                                 FACILITY:  [facility]   PHYSICIAN:  Donna Bernard, M.D.             DATE OF BIRTH:  10-06-1971   DATE OF ADMISSION:  01/01/2003  DATE OF DISCHARGE:  01/07/2003                                 DISCHARGE SUMMARY   FINAL DIAGNOSES:  1. Acute pancreatitis.  2. Bipolar disease with history of prior suicidal attempts.  3. History of pseudocyst and chronic pancreatitis.   FINAL DISPOSITION:  1. Patient discharged to home.  2. Discharge medicines:  Pancrease three tablets before each meal, Duragesic     patches 100 mcg/hour, apply one every three days, Ambien 5 mg q.h.s.     p.r.n. for sleep, Phenergan 25 mg q.4h. p.r.n. for nausea.  3. Follow up in the office in one week.  4. Maintain other usual medicines, including Prozac 40 mg daily and Seroquel     200 mg twice per day.  5. Once again, encouraged to stay in close contact with psychiatrist due to     significant psychiatric history.   INITIAL HISTORY AND PHYSICAL:  Please see H&P as dictated.   HOSPITAL COURSE:  This patient is a 40 year old white female with a history  of chronic pancreatitis and prior pseudocyst along with bipolar disease and  prior suicide attempts who presented to the hospital on the day of admission  with complaints of severe abdominal pain.  Her workup did reveal an elevated  amylase.  Patient was admitted to the hospital.  She was given morphine via  PCA pump.  We spoke with the gastroenterologist, who recommended  conservative therapy at this time.  Then, 48 hours into admission, the  patient was still having some discomfort, so therefore a CT scan was  ordered; she declined to have this done, and the following day, indeed, her  belly pain was much better, so we honored her wishes.  On the day of  discharge, her  enzymes have normalized, and she was discharged home with the  diagnoses and disposition as noted above.  It should be noted that the  patient has had prior suicide attempts, and though the specialists at  [facility] recommend ongoing OxyContin for her chronic pain, I do not feel  comfortable with this.  We arranged to try Duragesic patches at home; a  prescription was given and proper use discussed.                                               Donna Bernard, M.D.    [name]  D:  02/11/2003  T:  02/11/2003  Job:  [id number]

## 2011-02-23 NOTE — Discharge Summary (Signed)
   NAME:  JENICKA, COXE                          ACCOUNT NO.:  [id number]   MEDICAL RECORD NO.:  [id number]                   PATIENT TYPE:  INP   LOCATION:  A301                                 FACILITY:  [facility]   PHYSICIAN:  Donna Bernard, M.D.             DATE OF BIRTH:  Mar 05, 1971   DATE OF ADMISSION:  08/10/2002  DATE OF DISCHARGE:  08/12/2002                                 DISCHARGE SUMMARY   FINAL DIAGNOSES:  1. Acute pancreatitis.  2. Pseudocyst, recurrent.  3. Bipolar disease with history of suicide attempt.   DISPOSITION:  The patient transferred to [facility] for tertiary management.   HISTORY OF PRESENT ILLNESS:  For initial H&P, please see H&P as dictated per  Dr. Josefine Class.   HOSPITAL COURSE:  This patient is a 40 year old white female with a history  of prior pancreatitis complicated by the emergence of a pseudocyst which  required percutaneous drainage.  On the day of admission, she was readmitted  to the hospital with complaints of abdominal pain.  Her amylase and lipase  were significantly elevated.  The patient was placed on IV fluids, pain  medication was administered via IV.  The day following her admission, due to  ongoing pain, an ultrasound of the pancreas was ordered to follow up on the  pseudocyst.  Unfortunately, this ultrasound revealed the pseudocyst had  enlarged and worsened.  We called the patient's subspecialist at [facility],  who agreed to accept the patient in transfer.  The patient was discharged to  [facility] with a diagnosis and disposition as noted above.                                               Donna Bernard, M.D.    [name]  D:  09/19/2002  T:  09/19/2002  Job:  [id number]

## 2011-02-23 NOTE — Discharge Summary (Signed)
[facility]  Patient:    [name] [name], [name] [name] Visit Number: [id number] MRN: [id number]          Service Type: EMS Location: ED Attending Physician:  Herbert Seta Dictated by:   Jeanice Lim, M.D. Admit Date:  01/16/2002 Discharge Date: 01/16/2002                             Discharge Summary  IDENTIFYING DATA:  This is a 40 year old single Caucasian female voluntarily admitted for depression status post intentional overdose on 20 Xanax and 6 hydrocodone.  MEDICATIONS:  Paxil CR 37.5 mg, Xanax 1 mg q.i.d., Seroquel 100 mg q.h.s.  ALLERGIES:  REGLAN.  PHYSICAL EXAMINATION:  Essentially within normal limits.  Neurologically nonfocal.  LABORATORY DATA:  Urine drug screen positive for opiates and benzodiazepines. Alcohol 11.  CBC within normal limits.  CMET showed a blood sugar of 120. Acetaminophen level was initially 16.3 then repeated decreased to 12.2.  MENTAL STATUS EXAMINATION:  The patient was an alert, young, overweight Caucasian female dressed in gown.  Speech within normal limits.  Mood depressed.  Affect flat.  Thought processes goal directed.  Thought content negative for psychotic symptoms or current suicidal or homicidal ideation. Cognitively intact.  Judgment and insight poor.  ADMISSION DIAGNOSES: Axis I:    1. Major depressive disorder, recurrent, severe.            2. Panic disorder. Axis II:   None. Axis III:  1. History of migraines.            2. Pancreatitis. Axis IV:   Moderate (problems with primary support). Axis V:    30/65.  HOSPITAL COURSE:  The patient was admitted and ordered routine p.r.n. medications.  Was resumed on other medications including Neurontin, Paxil, Xanax, Seroquel, Anaprox and was optimized on Seroquel.  Xanax was slightly decreased as Paxil was optimized.  The patient tolerated tapering of Xanax and a family meeting was held to address psychosocial stressors.  Neurontin was optimized for  anxiety as Xanax was tapered and patient tolerated medication changes well.  CONDITION ON DISCHARGE:  Improved.  Mood was more stable and euthymic.  Affect brighter.  Thought process goal directed.  Thought content negative for dangerous ideation or psychotic symptoms.  DISCHARGE MEDICATIONS: 1. Seroquel 200 mg q.h.s. 2. Paxil CR 25 mg, 2 q.a.m. 3. Neurontin 300 mg q.i.d. 4. Xanax 0.5 mg t.i.d. and 1.5 mg q.h.s. 5. Ambien 10 mg q.h.s. p.r.n. insomnia. 6. Anaprox 275 mg, 2 q.12h. p.r.n. migraine.  FOLLOW-UP:  Dr. Katrinka Blazing on December 15, 2001 at 11:45 a.m.  DISCHARGE DIAGNOSES: Axis I:    1. Major depressive disorder, recurrent, severe.            2. Panic disorder. Axis II:   None. Axis III:  1. History of migraines.            2. Pancreatitis. Axis IV:   Moderate (problems with primary support). Axis V:    Global Assessment of Functioning on discharge 55. Dictated by:   Jeanice Lim, M.D. Attending Physician:  Herbert Seta DD:  01/20/02 TD:  01/20/02 Job: [id number]

## 2011-02-23 NOTE — Discharge Summary (Signed)
NAME:  [name] [name], [name] [name]                          ACCOUNT NO.:  [id number]   MEDICAL RECORD NO.:  [id number]                   PATIENT TYPE:  INP   LOCATION:  A303                                 FACILITY:  [facility]   PHYSICIAN:  Donna Bernard, M.D.             DATE OF BIRTH:  [birth year]   DATE OF ADMISSION:  01/15/2004  DATE OF DISCHARGE:  01/19/2004                                 DISCHARGE SUMMARY   FINAL DIAGNOSES:  1. Acute pancreatitis.  2. History of chronic pancreatitis.  3. History of bipolar disease.   FINAL DISPOSITION:  1. The patient is discharged to home.  2. Discharge medications:     A. Phenergan 25 mg p.o. q.4 to 6 p.r.n. for nausea.     B. Dilaudid one tablet q.4 h. as needed for pain, 4 mg.  3. Follow-up in the office in one week.   INITIAL HISTORY AND PHYSICAL:  Please see H&P as dictated.   HOSPITAL COURSE:  This patient is a 40 year old white female with a history  of chronic recurrent pancreatitis.  She presented to the hospital the date  of admission with complaints of severe abdominal pain.  She was admitted to  the hospital.  Her pancreatic enzymes were noted to be elevated.  She was  placed on IV Dilaudid along with IV fluids.  Her enzymes were followed in a  serial fashion.  Over the next several days, the patient slowly improved.  We advanced her diet.  The day of discharge she was some better.  The  patient was discharged home.   DIAGNOSES:  1. The patient is discharged to home.  2. Discharge medications:     A. Phenergan 25 mg p.o. q.4 to 6 p.r.n. for nausea.     B. Dilaudid one tablet q.4 h. as needed for pain, 4 mg.  3. Follow-up in the office in one week.     ___________________________________________                                         Donna Bernard, M.D.   WSL/MEDQ  D:  02/23/2004  T:  02/24/2004  Job:  [id number]

## 2011-02-23 NOTE — Procedures (Signed)
[facility]  Patient:    MAYLEN, WALTERMIRE Visit Number: [id number] MRN: [id number]          Service Type: MED Location: ICCU [id number] Attending Physician:  Rosalyn Charters Dictated by:   Kari Baars, M.D. Admit Date:  04/28/2002                            EKG Interpretations  The rhythm appears to be a sinus tachycardia with a rate of about 125. Voltage is high diffusely suggestive of left ventricular hypertrophy but there are no other changes suggestive of a "strain pattern."  There are small Q-waves inferiorly and clinical correlation is indicated.  These could indicate a previous inferior myocardial infarction and clinical correlation, as mentioned, is suggested. Dictated by:   Kari Baars, M.D. Attending Physician:  Rosalyn Charters DD:  04/29/02 TD:  05/04/02 Job: [id number]

## 2011-02-23 NOTE — Discharge Summary (Signed)
NAMEKRYSTIAN, [name] [name]                ACCOUNT NO.:  [id number]   MEDICAL RECORD NO.:  [id number]          PATIENT TYPE:  INP   LOCATION:  A335                          FACILITY:  [facility]   PHYSICIAN:  Donna Bernard, M.D.DATE OF BIRTH:  Aug 24, 1971   DATE OF ADMISSION:  03/03/2009  DATE OF DISCHARGE:  06/03/2010LH                               DISCHARGE SUMMARY   FINAL DIAGNOSES:  1. Gastritis.  2. Possible flare of pancreatitis.  3. Chronic headaches.  4. Depression.  5. Anxiety.  6. History of irritable bowel syndrome.   FINAL DISPOSITION:  1. The patient discharged home.  2. Increase omeprazole to 20 mg p.o. b.i.d.  3. Maintain same chronic meds.  4. Follow up in the office in 1-2 weeks.   INITIAL HISTORY AND PHYSICAL:  Please see H and P as dictated.   HOSPITAL COURSE:  This patient is a 40 year old female with a  complicated medical history including diagnoses as noted above who  presented to the office on date of admission with complaints of severe  epigastric pain.  She described there is 8/10 severity primarily in  epigastric region with some radiation.  The patient was admitted to  hospital.  She was started on IV fluids.  She had a history of recent H.  pylori gastritis.  She was given IV proton pump inhibitors.  Her  progress was disappointingly slow.  Based on this, GI folks were  consulted.  They noted that the patient had not followed through the  colonoscopy as recommended, but this likely was not the source of her  problems.  They did adjust her medications.  They felt that likely her  pain was coming from gastritis.  The patient slowly improved.  On the  day of discharge, she was feeling better, had a benign abdominal exam  and was sent home with diagnosis and disposition as noted above.      Donna Bernard, M.D.  Electronically Signed     WSL/MEDQ  D:  04/14/2009  T:  04/15/2009  Job:  [id number]

## 2011-02-23 NOTE — Op Note (Signed)
NAME:  [name] [name], [name] [name]                          ACCOUNT NO.:  [id number]   MEDICAL RECORD NO.:  [id number]                   PATIENT TYPE:  INP   LOCATION:  A308                                 FACILITY:  [facility]   PHYSICIAN:  Gerrit Friends. Rourk, M.D.               DATE OF BIRTH:  January 19, 1971   DATE OF PROCEDURE:  05/19/2002  DATE OF DISCHARGE:  05/20/2002                                 OPERATIVE REPORT   PROCEDURE:  Diagnostic esophagogastroduodenoscopy.   ENDOSCOPIST:  Gerrit Friends. Rourk, M.D.   INDICATIONS FOR PROCEDURE:  The patient is a 40 year old lady with  epigastric pain, elevated amylase and lipase.  She has had symptoms for  several days and is not improved.  EGD is now being done to rule out  a  process in her upper GI tract contributing to the clinical picture.  This  approach has been discussed with the patient at the bedside at length today.  The potential risks, benefits, and alternatives have been reviewed;  questions answered; she is agreeable.  Please see my documentation on the  medical record for more information.  I feel that she is at low risk for  conscious sedation in the way of Versed and Demerol.   DESCRIPTION OF PROCEDURE:  The patient was placed in the left lateral  decubitus position.  O2 saturation, blood pressure, pulse and respirations  were monitored throughout the entire procedure.   CONSCIOUS SEDATION:  Versed 6 mg IV, Demerol 100 mg IV in divided doses.   INSTRUMENT:  Olympus video chip adult gastroscope.   FINDINGS:  Examination of the tubular esophagus revealed no mucosal  abnormality.  The EG junction was easily traversed.   STOMACH:  The gastric cavity was empty.  It insufflated well with air;  however, there was a large area of what appeared to be extrinsic compression  on the posterior gastric wall.  Please see photos.  A thorough examination  of the gastric mucosa including a retroflex view of the proximal stomach and  esophagogastric  junction demonstrated a couple of tiny prepyloric antral  erosions; otherwise mucosa appeared normal.  There was an intact  fundoplication noted.  The pylorus was patent and easily traversed.   DUODENUM:  The bulb and the second portion appeared normal.   THERAPY/DIAGNOSTIC MANEUVERS:  None.   The patient tolerated the procedure well and was reacted in endoscopy.   IMPRESSION:  1. Normal esophagus.  2. Intact fundoplication.  3. Large area of extrinsic compression on the posterior gastric wall as     described above.  The remainder of the gastric mucosa appeared normal     except for a couple of trivial appearing prepyloric antral erosion.  The     pylorus patent.  Normal D1, D2.   RECOMMENDATIONS:  Proceed with abdominal pelvic CT with IV oral contrast to  further evaluate this lady for complicated pancreatitis i.e.  pseudocyst etc.  versus some  other process.  We will plan for CT on 05/20/02.  I have  discussed my findings with Dr. Donna Bernard via telephone this  afternoon.                                                Gerrit Friends. Rourk, M.D.    RMR/MEDQ  D:  05/19/2002  T:  05/25/2002  Job:  [id number]   cc:   Donna Bernard, M.D.  [address]  [place]  [place] [address]  Fax: [phone]

## 2011-02-23 NOTE — Op Note (Signed)
NAME:  [name] [name], [name] [name]                ACCOUNT NO.:  [id number]   MEDICAL RECORD NO.:  [id number]          PATIENT TYPE:  AMB   LOCATION:  SDC                           FACILITY:  [facility]   PHYSICIAN:  Guy Sandifer. Henderson Cloud, M.D. DATE OF BIRTH:  06-29-1971   DATE OF PROCEDURE:  12/16/2006  DATE OF DISCHARGE:                               OPERATIVE REPORT   PREOPERATIVE DIAGNOSIS:  Dysmenorrhea, menorrhagia.   POSTOPERATIVE DIAGNOSIS:  Dysmenorrhea, menorrhagia.   PROCEDURE:  Laparoscopically-assisted vaginal hysterectomy and  cystoscopy.   SURGEON:  Guy Sandifer. Henderson Cloud, M.D.   ASSISTANTFreddy Finner, M.D.   ANESTHESIA:  General with endotracheal intubation.   SPECIMEN:  Uterus to pathology.   ESTIMATED BLOOD LOSS:  200 mL.   INDICATIONS AND CONSENT:  This patient is a 40 year old separated white  female G2, P2, status post tubal ligation with at least 2 years of heavy  painful menses.  Details are dictated in the history and physical.  Laparoscopically-assisted vaginal hysterectomy is discussed with the  patient.  Potential risks and complications discussed preoperatively  including but not limited to infection, bowel, bladder, ureteral damage,  bleeding requiring transfusion of blood products with possible  transfusion reaction, HIV and hepatitis acquisition, DVT, PE, pneumonia,  fistula formation, laparotomy, postoperative dyspareunia, pelvic pain.  All questions were answered and consent signed on the chart.   FINDINGS:  Upper abdomen contained some adhesions in the left upper  quadrant.  There are no closed loops.  The uterus is about 6 weeks in  size, smooth in contour.  Tubes are status post ligation bilaterally.  Both ovaries are normal.  Anterior posterior cul-de-sacs were normal.   PROCEDURE:  The patient is taken to operating room where she is  identified, placed in dorsosupine position and general anesthesia is  induced via endotracheal intubation.  She is then placed in  dorsal  lithotomy position where she is prepped abdominally and vaginally.  Bladder straight catheterized.  Hulka tenaculum was placed in the uterus  as a manipulator. She is draped in a sterile fashion.  The  infraumbilical and suprapubic areas are injected in the midline with  1/2% plain Marcaine.  A small infraumbilical incision is made.  A  disposable Veress needle was placed and a normal syringe and drop test  are noted.  2 liters of gas were insufflated under low pressure with  good tympany in the right upper quadrant.  Veress needle is removed and  a 10/11 XL bladeless disposable trocar sleeve was placed using direct  visualization with the diagnostic laparoscopic.  After placement, the  operative laparoscopic was placed.  A small suprapubic incision was made  in the midline and a 5-mm XL bladeless disposable trocar sleeve was  placed under direct visualization without difficulty.  The above  findings were noted.  The proximal ligaments are then taken down  bilaterally to the level vesicouterine peritoneum using the gyrus  bipolar cautery cutting instrument.  A branch of the uterine vessel on  the right side is persistently bleeding and requires cautery to control.  Good hemostasis was obtained.  Irrigation is then carried out.  Excess  fluid was removed and attention is turned to the vagina.  The  instruments are removed from above as well as suprapubic trocar sleeve.  The weighted speculum was placed.  Posterior cul-de-sac is entered  sharply with scissors.  Cervix is circumscribed with the unipolar  cautery and the mucosa is advanced sharply and bluntly.  Then using the  gyrus bipolar cautery instrument, the uterosacral ligaments were taken  down followed by the cardinal ligaments bilaterally and the uterine  vessels bilaterally.  Anterior cul-de-sac is entered without difficulty.  Specimen is delivered posteriorly.  Proximal ligaments taken down and  specimens delivered.   Uterosacral ligaments are plicated to the vaginal  cuff bilaterally.  Suture at 3 and 9 o'clock is also placed to assure  complete hemostasis.  Cuff was then closed with figure-of-eights.  All  suture is 0-0 Monocryl unless otherwise designated.  The bladder was  then drained with a Foley catheter which was removed and cystoscopy is  carried out with a 70 degrees cystoscope in view of the cautery used on  the right uterine vessel.  Indigo carmine had been given previously and  good puff of urine is noted bilaterally from the ureteral orifices.  There is no evidence of perforation of the uterus or foreign body.  Cystoscope was removed.  Foley catheter was replaced to straight drain  to a bag and attention is returned to the abdomen.  Pneumoperitoneum is  reintroduced and under direct visualization the 5 mm suprapubic  bladeless trocar sleeve is replaced under direct visualization.  Copious  irrigation is carried out and no bleeding is noted.  Good hemostasis was  noted all around and the excess fluid was removed.  Suprapubic trocar  sleeve was removed.  Pneumoperitoneum was completely reduced and the  umbilical trocar sleeve was removed.  The skin was closed with  interrupted 3-0 Vicryl suture.  Dermabond was placed on both incisions.  All counts correct.  The patient is awakened, taken to recovery room in  stable condition.      Guy Sandifer Henderson Cloud, M.D.  Electronically Signed     JET/MEDQ  D:  12/16/2006  T:  12/16/2006  Job:  [id number]
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
## 2011-02-23 NOTE — H&P (Signed)
   NAME:  [name] [name], [name] [name]                          ACCOUNT NO.:  [id number]   MEDICAL RECORD NO.:  [id number]                   PATIENT TYPE:  INP   LOCATION:  A329                                 FACILITY:  [facility]   PHYSICIAN:  Donna Bernard, M.D.             DATE OF BIRTH:  April 27, 1971   DATE OF ADMISSION:  07/20/2003  DATE OF DISCHARGE:                                HISTORY & PHYSICAL   CHIEF COMPLAINT:  Abdominal pain.   SUBJECTIVE:  This patient is a 39 year old white female with history of  recurrent pancreatitis, history of esophageal ulcers, history of bipolar  disease and depression and anxiety who presents to the hospital the day of  admission with complaints of abdominal pain.  The patient states that she is  doing relatively well until the day prior to admission when she started  developing epigastric discomfort.  This worsened.  It was accompanied by  nausea and episode of vomiting.  The patient came on to the emergency room  for further assessment.   MEDICATIONS:  She claims compliance with her current medications which  include Seroquel 200 mg q.h.s.  No other medications at this time.   FAMILY HISTORY:  Positive for grandfather with history of pancreatitis.   SOCIAL HISTORY:  The patient is married with children.  No tobacco use, no  alcohol use.   PAST MEDICAL HISTORY:  Significant for intentional overdose.   ALLERGIES:  REGLAN.   REVIEW OF SYSTEMS:  Otherwise, negative.   PHYSICAL EXAMINATION:  VITAL SIGNS:  Blood pressure 130/82, afebrile.  The  patient is alert in some obvious discomfort.  HEENT:  Normocyte, decreased mucous membranes.  NECK:  Supple.  LUNGS:  Clear.  HEART:  Regular rate and rhythm.  ABDOMEN:  Epigastric tenderness.  No obvious masses.  Bowel sounds present.  No rebound.  Positive guarding.  EXTREMITIES:  Normal.  NEUROLOGICAL:  Intact.   LABORATORY DATA:  Significant labs:  Amylase 133, lipase 131, met-7 normal,  liver enzymes  normal, CBC normal.    IMPRESSION:  1. Acute pancreatitis.  2. Bipolar disorder.   PLAN:  As per orders.      ___________________________________________                                         Donna Bernard, M.D.   WSL/MEDQ  D:  07/21/2003  T:  07/21/2003  Job:  [id number]

## 2011-02-23 NOTE — H&P (Signed)
NAME:  [name] [name], [name] [name]                          ACCOUNT NO.:  [id number]   MEDICAL RECORD NO.:  [id number]                   PATIENT TYPE:  OBV   LOCATION:  A216                                 FACILITY:  [facility]   PHYSICIAN:  Hanley Hays. Dechurch, M.D.           DATE OF BIRTH:  1970/10/09   DATE OF ADMISSION:  10/04/2002  DATE OF DISCHARGE:                                HISTORY & PHYSICAL   HISTORY OF PRESENT ILLNESS:  The patient is a 40 year old Caucasian female  with chronic pancreatitis and depression followed by Dr. Simone Curia for  her primary care.  Today, she was found at home with a decreased level of  consciousness and brought to the emergency room via EMS where she was  unresponsive and hypopneic with a respiratory rate of 8.  She was given  Narcan and the patient became more alert and was able to give the history of  taking three methadone tablets and Phenergan for migraine headache with  nausea.  She notes relatively recent onset of menstrual migraines and  actually workup had been scheduled but was deferred secondary to a bout of  pancreatitis.  During our interview, the patient actually is able to arouse  and give answers but drifts off.  She is in no respiratory distress but  given the long half-life of methadone and short half-life of Narcan it is  felt that for the patient's safety that she should be admitted for  observation for the next few hours until back to baseline mental status.   MEDICATIONS:  Medications include Paxil, Seroquel, Ambien, Phenergan, and  methadone.   ALLERGIES:  She is allergic to [facility].   REVIEW OF SYSTEMS:  Pancreatitis has been relatively stable.  She was  admitted about a month ago at [facility] but not since.  She states her  depression is much better.  She had no intention whatsoever of harming  herself.  Menstrual migraines as noted above.   SOCIAL HISTORY:  She is married.  She has one child.  No tobacco or alcohol  abuse.   FAMILY MEDICAL HISTORY:  Noncontributory.   PHYSICAL EXAMINATION:  GENERAL:  Moderately obese white female; no distress.  She arouses to voice and will answer some questions but drifts off.  She can  follow commands though remains lethargic.  VITAL SIGNS:  Blood pressure is 110/65, pulse is 100 and regular,  respirations are 10 and unlabored.  HEENT:  Oropharynx is moist.  NEUROLOGIC:  Exam again is nonfocal although lethargic.  LUNGS:  Clear.  ABDOMEN:  Obese, protuberant, soft, positive bowel sounds.  EXTREMITIES:  Without clubbing, cyanosis, or edema.  SKIN:  Without rash, lesion, or breakdown.   ASSESSMENT AND PLAN:  Problem 1. UNINTENTIONAL DRUG OVERDOSE WITH DEPRESSED  MENTAL STATUS AND RESPIRATORY STATUS.  Monitor.   Problem 2. CHRONIC PANCREATITIS.  Stable at this time.   Problem 3. DEPRESSION.  Her medications will  be held until she is more alert  and then can resume per her primary medical doctor.                                               Hanley Hays Josefine Class, M.D.    FED/MEDQ  D:  10/05/2002  T:  10/05/2002  Job:  [id number]

## 2011-02-23 NOTE — H&P (Signed)
NAME:  [name] [name], [name] [name]                          ACCOUNT NO.:  [id number]   MEDICAL RECORD NO.:  [id number]                   PATIENT TYPE:  INP   LOCATION:  A303                                 FACILITY:  [facility]   PHYSICIAN:  Scott A. Gerda Diss, M.D.               DATE OF BIRTH:  16-Nov-1970   DATE OF ADMISSION:  01/15/2004  DATE OF DISCHARGE:                                HISTORY & PHYSICAL   CHIEF COMPLAINT:  1. Abdominal pain.  2. Nausea.   HISTORY OF PRESENT ILLNESS:  The patient states that she started having  nausea and pain early this morning.  The pain became progressive.  She came  to the emergency department.  She states the pain is epigastrium and left  upper quadrant, radiates to some degree into her back.  She denies chest  pressure, cough, shortness of breath, wheezing.  She denies diarrhea.  She  relates positive nausea with an episode of vomiting.  She denies dysuria,  urinary frequency, joint pain, rash.  She denies having any symptoms last  night.  No recent dietary measures that could have triggered this.   PAST MEDICAL HISTORY:  1. Renal stones.  2. Esophageal ulcers.  3. Depression and anxiety.  4. Bipolar disease.  5. Glucose intolerance.  6. Cesarean section in the past.   FAMILY HISTORY:  Heart disease.   SOCIAL HISTORY:  She does not smoke or drink.   ALLERGIES:  REGLAN.   MEDICATIONS:  1. Ambien 5 mg daily.  2. Lithium 30 mg b.i.d.  3. Seroquel 300 mg daily.   REVIEW OF SYSTEMS:  Negative for headaches.  See per above.   PHYSICAL EXAMINATION:  GENERAL:  She looks to be in pain but does not appear  toxic.  HEENT:  TM's within normal limits, __________within normal limits.  NECK:  Supple.  CHEST:  Clear to auscultation.  HEART:  Regular.  ABDOMEN:  Soft, moderate epigastric and left-upper quadrant tenderness, no  guarding or rebound.  NEUROLOGIC:  No tremors, overall normal.  SKIN:  Warm and dry, no rashes.  EXTREMITIES:  No edema.  Pulses are  good.   LABORATORY DATA:  White count 5.7, hemoglobin 14.3.  Urinalysis negative for  blood.  Liver enzymes normal.  Potassium and kidney functions are normal.  Lipase 772.   ASSESSMENT/PLAN:  1. Pancreatitis, flareup.  This patient has an extensive history of having     pancreatitis.  She has a chronic pancreatic condition that triggers this.  2. She has had a history in the past of pseudocyst, most recently in the     hospital for this in February.  3. Hopefully will respond to ice chips and pain medications.  4. We will follow the numbers.  If does not improve dramatically over the     next several days, may need to have scan done, but I do not feel one is  necessary currently.     ___________________________________________                                         Jonna Coup. Gerda Diss, M.D.   Linus Orn  D:  01/15/2004  T:  01/15/2004  Job:  [id number]

## 2011-02-23 NOTE — Consult Note (Signed)
NAME:  Demond, Sheneka                ACCOUNT NO.:  [id number]   MEDICAL RECORD NO.:  [id number]          PATIENT TYPE:  INP   LOCATION:  A302                          FACILITY:  [facility]   PHYSICIAN:  R. Roetta Sessions, M.D. DATE OF BIRTH:  24-Dec-1970   DATE OF CONSULTATION:  06/30/2006  DATE OF DISCHARGE:  07/02/2006                                   CONSULTATION   REASON FOR CONSULTATION:  Pancreatitis.   HISTORY OF PRESENT ILLNESS:  Ms.  Sheina Mcleish is a pleasant 40 year old  Caucasian female with almost innumerable episodes of recurrent pancreatitis  which began just after getting her gallbladder out in 2001.  She has been  admitted to this facility on multiple occasions and it has been evaluated  extensively at [facility] and at [facility].  This nice lady started having her typical  pancreatitis epigastric periumbilical pain yesterday afternoon and she  presented to the hospital and was admitted to the hospital service last  evening.  She tells me she has had approximately 60 of these episodes in the  past one year.  She has not been admitted with every occasion.  She tells me  she was at her daughter's dance recital in [place] back in June and had  an attack and S it out with oral narcotics over a few day period.  Admission data includes amylase and lipase of 339 and 788 on September 22.  Numbers today are amylase 331 and lipase 819.  GOT and GPT 30 and 44 on  September 22 21 and 36 respectively on September 23. Total bilirubin 0.7,  alkaline phosphatase 48.  She had an ultrasound of the right upper quadrant  revealed a fatty-appearing liver CBD 8 mm.  CT scan done today with IV and  oral contrast (which  I have reviewed via telephone with Norva Pavlov,  M.D.) revealed some stranding around the pancreas.  The pancreas was viable.  No evidence of biliary dilation or other complications such as pseudocyst  formation.  She  has been placed n.p.o. and has been given IV fluids and  analgesic therapy.   This lady's past GI history is most extensive regarding pancreatitis.  As  stated above, she has been evaluated in 2003 and 2004 at [facility].  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  She had an endoscopic ultrasound at least one ERCP.  She has had external and internal drainage of pseudocyst there.   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  She saw Dr. Rockie Neighbours over at [facility], GI surgeon, who did not feel he  had anything to offer her from surgery standpoint.  This lady has been off  and on pancreatic enzyme therapy.  She tells me when she starts feeling  good, she stops taking enzyme supplements.  She adamantly denies any alcohol  consumption.   Serum calcium is normal.  Recent triglyceride level not known to me.  Serum  pregnancy test has come back negative.  There is 21 to 50 red blood cells on  her urinalysis.   PAST MEDICAL HISTORY:  Significant for bipolar disease status post  laparoscopic Nissen fundoplication and cholecystectomy.  Status post  cesarean section.  History of treatment for intentional overdose.  History  of depression and bipolar illness.   MEDICATIONS ON ADMISSION:  Ambien, Klonopin, lithium, Neurontin, oxycodone,  acetaminophen, Phenergan, Pseudovent.   ALLERGIES:  REGLAN.   FAMILY HISTORY:  Mother has fibromyalgia.  Father is a diabetic, no history  of chronic GI illness.   SOCIAL HISTORY:  The patient is married.  She has two daughters.  She is on  disability.  She does not use alcohol, cigarettes or illicit drugs.   REVIEW OF SYSTEMS:  No recent chest pain, dyspnea on exertion, fever,  chills, no change in weight.  No odynophagia, dysphagia, early satiety, no  melena, rectal bleeding, constipation,  diarrhea, no change in weight.   PHYSICAL EXAMINATION:  Pleasant 40 year old lady in her hospital bed  accompanied by her husband and two daughters.  VITAL SIGNS:  Temperature 97.7, pulse 84, respiratory rate 16, blood  pressure 108/70.  SKIN:  Warm and dry.  There is no jaundice.  Negative stigmata for chronic  liver disease.  HEENT:  No scleral icterus.  Oral cavity no lesions.  CHEST:  Lungs are clear to auscultation.  HEART:  Regular rate and rhythm without murmur, gallop or rub.  BREAST:  Deferred.  ABDOMEN:  Obese.  Bowel sounds are diminished.  She has periumbilical  epigastric tenderness to palpation, no appreciable mass or organomegaly.  EXTREMITIES:  No edema.   LABORATORY DATA:  White count 7300, hemoglobin 12.2, hematocrit 35.6, it is  down from 13.8 and 40.7 on admission.  Sodium 139, potassium 4.8, chloride  108, CO2 28, glucose 128, BUN 6, creatinine 0.8, bilirubin 0.7, alkaline  phosphatase 48, AST 21, ALT 36, protein 5.4, albumin 3.1, calcium 8.5,  amylase 331 and 819 today.   IMPRESSION:  Ms. Jozelynn Danielson is a pleasant 40 year old lady with recurrent  pancreatitis.  She has had almost innumerable episodes going back a good six  years.  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  I see no other reason for  pancreatitis at this time.  Moreover, this episode appears to be  uncomplicated at the moment.   RECOMMENDATIONS:  1. Fully agree with your current approach which is largely going to be      supportive.  2. When she has satisfactory improved clinically, we can advance her diet.      She really needs to get back on pancreatic enzymes with concomitant      acid suppression therapy and take these agents on a scheduled basis      religiously.  I told her that it was most important to take them daily     even on her  good days, so hopefully it may minimize or ameliorate her      bad days.  3. It has been some time since she has been seen at the tertiary referral      center.  I think it would be in her best interest to get back over to      [facility] to see the  GI folks over there to contemplate further      evaluation with possible repeat EUS versus pancreatogram attempt at      stenting via ERCP.   I do not see where she has had a triglyceride level done recently.  Would  obtain a lipid panel if this has not been obtained as an outpatient  recently.   I would like to thank Dr. August Luz for allowing me to see this nice lady  today.      Jonathon Bellows, M.D.  Electronically Signed     RMR/MEDQ  D:  06/30/2006  T:  07/02/2006  Job:  [id number]   cc:   Orlinda Blalock Phys, Hospitalist Svc

## 2011-02-23 NOTE — H&P (Signed)
[facility]  Patient:    [name] [name], [name] [name]                       MRN: [id number] Adm. Date:  [id number] Attending:  Rachael Fee Dictator:   Candi Leash. Theressa Stamps, N.P.                   Psychiatric Admission Assessment  IDENTIFYING INFORMATION:  This is a 40 year old separated white female voluntarily admitted to [facility] on March 12, 2001 for overdose on Xanax.  HISTORY OF PRESENT ILLNESS:  Patient presents with a history of depression. She was discharged on Thursday from [facility].  Patient states that she got involved with in a relationship with a former patient.  She found out he was on cocaine.  She got very upset as he was to help her out financially for a new place to live.  Patient reports she took four Xanax with the intention to sleep and forget about things.  She told her roommate, who got fearful and called 911.  Patient reports that her intention was to sleep and not to hurt herself.  She has been sleeping good.  Appetite has been good. She feels things have improved since her discharge.  She states that "she had a bad day and she was not trying to kill herself."  She is anxious for discharge as she is to move today.  She denies any suicidal ideation, homicidal ideation, no auditory or visual hallucinations or paranoia.  PAST PSYCHIATRIC HISTORY:  Patient DD:  03/12/01 TD:  03/12/01 Job: [id number]

## 2011-02-23 NOTE — Discharge Summary (Signed)
NAMEBLANKA, ROCKHOLT                            ACCOUNT NO.:  [id number]   MEDICAL RECORD NO.:  [id number]                  PATIENT TYPE:   LOCATION:                                       FACILITY:   PHYSICIAN:  Donna Bernard, M.D.             DATE OF BIRTH:   DATE OF ADMISSION:  DATE OF DISCHARGE:  06/09/2002                                 DISCHARGE SUMMARY   FINAL DIAGNOSIS:  Acute pancreatitis.   DISPOSITION:  Patient discharged home.   DISCHARGE MEDICATIONS:  1. Maintain Prevacid daily.  2. Add Pancrease as directed.  3. Maintain Paxil CR 50 daily.  4. Use Vicodin 1 q.4-6h. as needed.  5. Maintain Xanax 1 mg twice a day as needed for nerves.  6. Ambien at night as needed for sleep.   DISCHARGE INSTRUCTIONS:  1. Follow up with GI specialist at [facility] as scheduled.  2. Follow up in our office in 1-2 weeks.  3. Low fat diet.  4. Advance activities slowly.   HISTORY OF PRESENT ILLNESS:  The initial H&P is dictated by Dr. Milford Cage.   HOSPITAL COURSE:  This patient is a 40 year old white female with a history  of known pancreatitis 1 week status post stent placement at  [facility].  He presented on the day of admission with progressively worsening  nausea, vomiting, and abdominal pain.  The blood work revealed an elevated  lipase of 266 and amylase at 382.   The patient was admitted to the hospital.  She was started on IV fluids and  pain medication.  Morphine was administered 4 mg q.2h. as needed for pain.  The patient was given D5-1/2 normal plus 20 of potassium 150/liter.  Neurologists were consulted, they recommended __________ CT scan.  About the  same time we advanced the patient's diet.  This was approximately 3 days  after admission. The patient tolerated this well and we began to wean her  off of IV fluids.  Of note, serial enzymes were obtained which showed  ongoing improvement of enzymes, though not complete normalization.  A CT  scan  revealed changes of pancreatitis and a pseudocyst formation.  The  pseudocyst was improved compared to a prior report; however, there was  another pseudocyst that was present within the pancreatic parenchyma. These  results were discussed with Dr. [name], by the radiologist.   With the patient's history of depression and anxiety, and even suicide  attempt in the past, we had significant difficulty appropriately maintaining  the patient's pain, yet not providing her an avenue for hurting herself  again.  Currently the patient claimed no suicidal thoughts.   On the day of discharge she was feeling better, but understood completely  that she needed to follow up with a GI specialist at [facility] for  further management of her concerns.  The patient is discharged home with  diagnosis the same.  Disposition as noted above.                                               Donna Bernard, M.D.    [name]  D:  11/10/2002  T:  11/11/2002  Job:  [id number]

## 2011-02-23 NOTE — H&P (Signed)
NAME:  [name] [name], [name] [name]                          ACCOUNT NO.:  [id number]   MEDICAL RECORD NO.:  [id number]                   PATIENT TYPE:  INP   LOCATION:  A331                                 FACILITY:  [facility]   PHYSICIAN:  Francoise Schaumann. Halm, D.O.                DATE OF BIRTH:  02-28-1971   DATE OF ADMISSION:  06/04/2002  DATE OF DISCHARGE:                                HISTORY & PHYSICAL   CHIEF COMPLAINT:  Vomiting and abdominal pain.   BRIEF HISTORY:  The patient is a 40 year old female with a known history of  pancreatitis and who is one week status post stent placement at [facility] who presents with a two-day history of progressively worsening  nausea, vomiting, and abdominal pain.  She has had anorexia and rates her  pain as severe.  She has had similar symptoms associated with acute  pancreatitis, having been admitted to the hospital within the last few weeks  for the same.  She apparently was treated for a pancreatic pseudocyst with  two stents having been placed at [facility]. She had seen Dr. Karilyn Cota  in the past for her hospitalizations.   PAST MEDICAL HISTORY:  1. Acute pancreatitis.  2. Migraine headaches.  3. Kidney stones.  4. Bipolar disorder with numerous suicide attempts.  5. Gastroesophageal reflux.   SURGICAL PROCEDURES:  Cholecystectomy, cesarean section x2, and  fundoplication.  She has also had the recent procedure of a stent placement  x2.   MEDICATIONS:  Phenergan p.[name].n. and Prevacid.   ALLERGIES:  REGLAN.   SOCIAL HISTORY:  The patient is a single mother.  She denies smoking or  alcohol use.   FAMILY HISTORY:  Noncontributory for pancreatitis or abdominal problems.   REVIEW OF SYSTEMS:  The patient had been doing fairly well up until the last  two days, where her symptoms had progressed.  She has had no significant  diarrhea, blood in her stools, bloody emesis.  She describes her pain as  being epigastric and quite severe.  She has  received relief while in the  emergency room, having received IV morphine 4 mg.  She has had no  constipation problems, GU symptomatology, or URI symptoms.   PHYSICAL EXAMINATION:  VITAL SIGNS:  Temperature 98.8, pulse 112, blood  pressure 128/79, weight 186.  GENERAL:  The patient is in no significant distress upon my evaluation.  She  is very cooperative and has a fairly good affect.  HEENT:  Mucous membranes are moist.  SKIN:  Shows no rash.  LUNGS:  Clear.  HEART:  Regular.  ABDOMEN:  Soft, moderately obese, with diffuse tenderness particularly in  the epigastric region.  She has no obvious rebound.  Her bowel sounds are  present but decreased.  EXTREMITIES:  Show no significant edema.  NEUROLOGIC:  Unremarkable.  GENITOURINARY AND BREAST:  Deferred and not indicated on her presentation.  LABORATORY DATA:  WBC 12, 200; hemoglobin 11.1; platelets 567,000.  Lipase  elevated at 266, amylase 382.  Liver function tests are all normal with a  normal ALT and AST and a bili of 0.5.   IMPRESSION:  1. Acute pancreatitis, which is recurrent.  2. Status post biliary ductal stent placement.  3. History of gastroesophageal reflux.  4. History of suicide attempts and psychiatric disorders.   PLAN:  The plan will be to admit the patient to the hospital for IV  rehydration, IV pain management, and slow advancement of her diet.  We will  recheck her amylase and lipase and CBC in the morning.  We will have Dr.  Karilyn Cota and Dr. [name] see the patient as they know her history fairly well.  The overall care plan has been reviewed with the patient and she is in  agreement.                                                 Francoise Schaumann. Milford Cage, D.O.    [facility]/MEDQ  D:  06/04/2002  T:  06/05/2002  Job:  [id number]   cc:   Donna Bernard, M.D.  [address]  [place]  [place] [address]  Fax: [phone]   Scott A. Gerda Diss, M.D.

## 2011-02-23 NOTE — H&P (Signed)
NAME:  [name] [name], [name] [name]                          ACCOUNT NO.:  [id number]   MEDICAL RECORD NO.:  [id number]                   PATIENT TYPE:  INP   LOCATION:  A308                                 FACILITY:  [facility]   PHYSICIAN:  Francoise Schaumann. Halm, D.O.                DATE OF BIRTH:  1971-08-22   DATE OF ADMISSION:  05/15/2002  DATE OF DISCHARGE:                                HISTORY & PHYSICAL   CHIEF COMPLAINT:  Abdominal pain.   BRIEF HISTORY:  The patient is a 40 year old female who presents to the  emergency department as her second outpatient visit in as many days for a  three-day history of nausea, generalized abdominal pain and an increased  lipase and amylase.  The patient has a known history of three episodes of  pancreatitis which have been treated medically in the past.  She states that  approximately three days ago, she became nauseated and anorexic and has had  difficulty controlling her epigastric and generalized abdominal discomfort.  She was seen by Dr. Lacretia Nicks. Simone Curia yesterday, who obtained some  laboratory tests including an amylase and lipase, which were both minimally  elevated.   She was evaluated again in the emergency room today and had repeat lab  testing which showed persistent mild elevation in her lipase and amylase, a  mildly elevated WBC and an increase in her abdominal tenderness.  She is  admitted to the hospital for further management of her pancreatitis.   PAST MEDICAL HISTORY:  The patient has had three or four episodes of  pancreatitis in the past.  She states that the initial one was due to a  stone.  She has had a cholecystectomy as well as a hiatal hernia  fundoplication repair.  She has a history of bipolar disorder.  She has a  history of numerous suicide attempts which resulted in medication overdoses  and hospitalization.  Her most recent hospitalization was within the last  few weeks for a medication overdose.  She was subsequently  transferred to an  inpatient facility for psychiatric at [facility] in [place] and  was started on a new medication that she states is called Abilify.  Tubal  ligation and cesarean section.   ALLERGIES:  REGLAN causes muscle tightening.   MEDICATIONS:  Abilify -- dose unknown.   SOCIAL HISTORY:  The patient is married and has a child.  She drinks a  moderate amount of caffeinated drinks.  She denies any significant alcohol  ingestion or smoking.   FAMILY HISTORY:  Noncontributory.   REVIEW OF SYSTEMS:  The patient denies any vomiting.  She has had some  epigastric abdominal pains.  She has been passing gas and had a normal bowel  movement on the morning of admission.  She denies any URI symptoms, cough,  congestion, fever or significant headaches.  She has been unable to eat  foods but  has been able to keep plenty of ginger-ale down.  She has had no  unusual rashes or joint pains.   PHYSICAL EXAMINATION:  GENERAL:  The patient is in general obese with a  weight of 190.  She is a very pleasant female with a normal affect.  She  appears to be in mild distress.  VITAL SIGNS:  Her blood pressure is 137/85, respirations 24, pulse 125,  temperature 98.2.  HEAD AND NECK:  Unremarkable.  HEART:  Regular and tachycardic with no murmur.  LUNGS:  Clear.  ABDOMEN:  Abdomen is obese, soft, with some epigastric tenderness.  There  are no obvious masses.  She has good active bowel sounds and no evidence of  rebound tenderness and she has no flank pain.  EXTREMITIES:  Her extremities are unremarkable with no edema or significant  rash.  GYN:  Deferred at this time.  BREASTS:  Deferred at this time.  RECTAL:  Deferred at this time.   LABORATORY STUDIES:  Today's amylase and lipase are 186 and 117,  respectively.  Her liver function tests are completely normal.  Her  electrolytes and renal function are completely normal and her white blood  cell count is 12,800 with 76%  neutrophils.   IMPRESSION AND PLAN:  1. Acute pancreatitis with history of the same.  2. Bipolar disorder.  3. Suicide attempts in the past.   Plan will be admit to the hospital.  We will keep her mostly n.p.o. and  hydrate her intravenously.  We will provide her with intravenous pain and  nausea medications and recheck her laboratory studies daily.  The overall  care plan has been reviewed with the patient and her husband and they are in  agreement.                                                Francoise Schaumann. Milford Cage, D.O.    SJ[facility]/MEDQ  D:  05/15/2002  T:  05/18/2002  Job:  [id number]   cc:   Donna Bernard, M.D.  [address]  [place]  [place] [address]  Fax: [phone]

## 2011-02-23 NOTE — H&P (Signed)
NAME:  [name] [name], [name] [name]                          ACCOUNT NO.:  [id number]   MEDICAL RECORD NO.:  [id number]                   PATIENT TYPE:  INP   LOCATION:  A326                                 FACILITY:  [facility]   PHYSICIAN:  Scott A. Gerda Diss, M.D.               DATE OF BIRTH:  1971-09-10   DATE OF ADMISSION:  06/08/2004  DATE OF DISCHARGE:                                HISTORY & PHYSICAL   CHIEF COMPLAINT:  Abdominal pain.   HISTORY OF PRESENT ILLNESS:  This is a 40 year old white female who presents  with severe epigastric pain and discomfort started at 5:30 this morning and  awoke her from her sleep.  Relates nausea but no vomiting.  Denies fever,  denies chest tightness, coughing, shortness of breath, denies dysuria,  urinary frequency, denies diarrhea.  States that she was doing well before  this hit her.  The patient states that the pain is in the epigastric area  and does not radiate.   PAST MEDICAL HISTORY:  The patient has had problems in the past with  reoccurring pancreatitis. She has had a cholecystectomy in the past and a  remote C-section.   FAMILY HISTORY:  She has a family history positive for coronary artery  disease and a grandfather with pancreatitis.   ALLERGIES:  She is allergic to [facility].   MEDICATIONS:  1.  Ambien 5 mg q.h.s.  2.  Seroquel 300 mg q.h.s.  3.  Lithium 300 mg 1 in the morning and 2 in the evening.  4.  Patient also has had problems in the past with esophageal gastritis,      reflux disease, kidney stones, depression, anxiety, glucose intolerance,      and bipolar disease. She does not smoke or drink.   PHYSICAL EXAMINATION:  GENERAL:  NAD.  VITAL SIGNS:  Stable.  HEENT:  TMs -- NL.  NECK:  Supple.  No masses.  CHEST:  CTA.  No crackles.  HEART:  Regular.  ABDOMEN:  Soft.  No guarding or rebound.  EXTREMITIES:  No edema.  SKIN:  Warm, dry.   Lipase is elevated with the white count of 7.3, hemoglobin 13.5.  Sodium  139, potassium 4.1,  glucose 136, BUN 12, creatinine 0.8.   ASSESSMENT AND PLAN:  1.  Pancreatitis -- will go ahead with admitting the patient, recheck tests      in the morning.  Do not feel that scan or ultrasound necessary at this      point.  2.  Will use pain medications for this patient, but will be cautious on the      dosaging.  She has been sensitive to it in the past.  It has caused some      problems, but I feel that this reduced dose of pain and nausea medicine      that it ought to do well for her.  ___________________________________________                                         Jonna Coup Gerda Diss, M.D.   Linus Orn  D:  06/08/2004  T:  06/08/2004  Job:  [id number]

## 2011-02-23 NOTE — Discharge Summary (Signed)
NAME:  [name], [name]                ACCOUNT NO.:  [id number]   MEDICAL RECORD NO.:  [id number]          PATIENT TYPE:  INP   LOCATION:  A302                          FACILITY:  [facility]   PHYSICIAN:  Scott A. Gerda Diss, MD    DATE OF BIRTH:  [birth year]   DATE OF ADMISSION:  06/29/2006  DATE OF DISCHARGE:  09/25/2007LH                                 DISCHARGE SUMMARY   DISCHARGE DIAGNOSIS:  1. Pancreatitis.  2. Abdominal pain.  3. Chronic pancreatitis.  4. History of manic depression.   HOSPITAL COURSE:  The patient was admitted in with severe abdominal pain and  discomfort.  She was kept n.p.o. and was treated with Dilaudid.  She was  consulted by Dr. [name] who recommended supportive care.  Her enzymes were  monitored.  Her condition was monitored.  She gradually improved over the  course of the next few days.  On September 24, we started clear liquids  later in the day.  We were able to keep the pain under good control.  She  tolerated clear liquids and a bland diet.  On September 25, she was stable  and able to go home.  She was discharged to home in good condition with  instructions to follow up in the office in approximately one weeks time.  It  should be noted that she did have an ultrasound done which showed fatty  infiltration of the liver and the pancreas was obscured, chronic changes of  the kidney.  She had a CT scan of the pelvis.  No abnormality in the pelvis.  A CT scan of the abdomen showed hazy changes around the pancreatic head  consistent with acute pancreatitis, no complicating features observed.      Scott A. Gerda Diss, MD  Electronically Signed     SAL/MEDQ  D:  08/02/2006  T:  08/03/2006  Job:  [id number]

## 2011-02-23 NOTE — H&P (Signed)
NAME:  [name] [name], [name] [name]                          ACCOUNT NO.:  [id number]   MEDICAL RECORD NO.:  [id number]                   PATIENT TYPE:  INP   LOCATION:  A336                                 FACILITY:  [facility]   PHYSICIAN:  Scott A. Gerda Diss, M.D.               DATE OF BIRTH:  06/06/1971   DATE OF ADMISSION:  12/01/2003  DATE OF DISCHARGE:                                HISTORY & PHYSICAL   CHIEF COMPLAINT:  Abdominal pain and discomfort.   HISTORY OF PRESENT ILLNESS:  This is a 40 year old white female who has a  history of chronic pancreatitis presents with a less than 12-hour history of  epigastric pain and discomfort that radiates into the back, also to the left  side of the abdomen.  She relates nausea but no vomiting.  No fever, chills,  or dysuria.  She denies previous symptoms.  Denies muscle aches, cough,  congestion.  States the pain is severe enough to seek help.  She tried just  letting it rest this morning but that did not really help the pain.   PAST MEDICAL HISTORY:  1. Chronic pancreatitis.  2. Depression.  3. Kidney stones.  4. Esophageal ulcers.  5. Migraine headaches.  6. Bipolar disease.   Previous surgery - gallbladder.  She has one child.  Heart disease runs in  the family.   ALLERGIES:  REGLAN.   MEDICATIONS:  1. Phenergan p.[name].n.  2. Lipram three with meals  3. Seroquel 600 mg daily.  4. Sonata 20 mg one q.h.s.  5. Unknown antidepressant.   REVIEW OF SYSTEMS:  See per above.   PHYSICAL EXAMINATION:  GENERAL:  NAD.  HEENT:  TMs NLT-NL.  Sinus nontender.  NECK:  Supple.  CHEST:  CTA.  No crackles.  HEART:  Regular, pulses normal.  ABDOMEN:  Soft, moderate epigastric and left upper quadrant tenderness to  palpation.  No guarding or rebound.  EXTREMITIES:  No edema.   LABORATORY WORK:  White count 5.2, hemoglobin 12.8.  Sodium 139, potassium  3.7, BUN 8, creatinine 0.7, calcium 8.7.  Lipase 361, amylase is 153.  Liver  enzymes are normal.   ASSESSMENT AND PLAN:  Pancreatitis - acute flare up.  Bowel rest along with  pain control and IV fluids.  Monitor closely.  Do not feel CT scan or  ultrasound necessary at this point.  Follow up if ongoing troubles later  today.  Otherwise, will see the patient again tomorrow morning.     ___________________________________________                                         Jonna Coup. Gerda Diss, M.D.   Linus Orn  D:  12/01/2003  T:  12/01/2003  Job:  [id number]

## 2011-02-23 NOTE — Discharge Summary (Signed)
NAMEROBINN, OVERHOLT                ACCOUNT NO.:  [id number]   MEDICAL RECORD NO.:  [id number]          PATIENT TYPE:  INP   LOCATION:  A327                          FACILITY:  [facility]   PHYSICIAN:  Donna Bernard, M.D.DATE OF BIRTH:  Oct 22, 1970   DATE OF ADMISSION:  07/07/2004  DATE OF DISCHARGE:  10/05/2005LH                                 DISCHARGE SUMMARY   FINAL DIAGNOSES:  1.  Acute pancreatitis.  2.  Chronic recurrent pancreatitis.  3.  Chronic reflux.   FINAL DISPOSITION:  The patient discharged to home.   DISCHARGE MEDICATIONS:  1.  The patient to maintain her usual psychotropic medications.  2.  Protonix 40 mg daily x30 days.  3.  Percocet 5/325, one every four to six hours as needed for pain.  4.  Low-fat diet.   FOLLOWUP:  With Dr. Donna Bernard next week.   INITIAL HISTORY AND PHYSICAL:  Please see H&P as dictated.   HOSPITAL COURSE:  This patient is a 40 year old white female with a history  of chronic, recurrent pancreatitis who presented to the office on the day of  admission with complaints of severe abdominal pain and discomfort.  The  patient had nausea and also vomiting.  She had no fever.  Evaluation  revealed that her amylase is 407 and lipase is 680.  The patient was  admitted to the hospital and started on IV fluids.  She was also given IV  analgesia with Dilaudid 2 mg IV every three hours.  The patient also was  given nasal cannula at two liters per minute.  Her usual psychotropic  medications were maintained.   Over the next several days, the patient gradually improved.  Her diet  initially n.p.o. was advanced to clear liquids and then on to regular near  the day of discharge.  On the day of discharge, the patient was feeling  better, and she was discharged home.   DIAGNOSIS/DISPOSITION:  As noted above.     Kristine Royal   WSL/MEDQ  D:  09/04/2004  T:  09/04/2004  Job:  [id number]

## 2011-02-23 NOTE — H&P (Signed)
NAME:  [name] [name], [name] [name]                ACCOUNT NO.:  [id number]   MEDICAL RECORD NO.:  [id number]          PATIENT TYPE:  INP   LOCATION:  A327                          FACILITY:  [facility]   PHYSICIAN:  Scott A. Gerda Diss, M.D.  DATE OF BIRTH:  May 02, 1971   DATE OF ADMISSION:  07/07/2004  DATE OF DISCHARGE:  LH                                HISTORY & PHYSICAL   CHIEF COMPLAINT:  Abdominal pain.   HISTORY OF PRESENT ILLNESS:  This is a 40 year old white female with a  history of recurrent pancreatitis who presents today with severe abdominal  pain and discomfort. States that she has had nausea, vomiting, abdominal  pain, and discomfort. Denies diarrhea, denies fevers or chills. Relates that  the pain is so severe she cannot stand it. In addition to this, she states  if she tried to eat she would throw up pretty much anything and has been  unable to get any type of relief. She has not had any bloody stools. Denies  dysuria. Denies constipation. Denies breathing difficulties.   PAST MEDICAL HISTORY:  She has had recurring problems with pancreatitis. It  has been worked up in the past, and there has not been any specific findings  that have indicated why she keeps getting this. In addition to this, she has  had a cholecystectomy in the past and also, too, she has had some times when  she has been in the hospital when she is on pain medication to have side  effect of hypoventilation. Of note, she has had 1 episode of hypoventilation  when she was on the PCA pump, but she had not had than when getting IV pain  medicine. While here in the ER, they gave her pain medicine and followed it  with 2 more mg, and her O2 saturation did go down, but she was alert,  conscious, and not apneic. She was placed on some O2, and it corrected  itself, and when I saw her, the patient was very alert, not having any  breathing difficulties.   MEDICATIONS:  1.  Seroquel 300 mg q.h.s.  2.  Ambien 5 mg q.h.s.  3.   Lithium 300 mg 1 in the morning and 2 in the evening.   LABORATORY DATA:  Overall laboratory work looked good, but amylase is 407  and lipase is 680.   PHYSICAL EXAMINATION:  GENERAL:  NAD.  HEENT:  TMs normal. T normal.  NECK:  No masses.  CHEST:  CTA. No crackles.  HEART:  Regular.  ABDOMEN:  Soft with moderate epigastric tenderness. No guarding or rebound.  EXTREMITIES:  Warm and dry.  NEUROLOGICAL:  Grossly normal.   ASSESSMENT/PLAN:  Abdominal pain/pancreatitis. Treat with pain medication 2  mg Dilaudid IV q.3h. p.r.n. pain. I do not feel that this should give any  apneic spells as long as these instructions are followed. I would not go up  on the dose of medicine. Continue her lithium, Seroquel, and Ambien as  directed. Recheck laboratory work in the morning. If there is progressive  elevation of her lipase and amylase over  the course of the next several days  despite being kept on ice chips, then she may well need to have a CT scan  done, but I do not feel that is necessary at this point in time. Plus, also,  if she has recurring bouts of severe, pain, fever, or other such things, she  would need to have CT scan done. We will advance her diet when she has her  numbers going down and is able to tolerate it and expect her to be in the  hospital over the course of the next several days.      SAL/MEDQ  D:  07/07/2004  T:  07/07/2004  Job:  [id number]

## 2011-02-23 NOTE — H&P (Signed)
   NAME:  [name] [name], [name] [name]                          ACCOUNT NO.:  [id number]   MEDICAL RECORD NO.:  [id number]                   PATIENT TYPE:  INP   LOCATION:  A329                                 FACILITY:  [facility]   PHYSICIAN:  Donna Bernard, M.D.             DATE OF BIRTH:  1970-10-21   DATE OF ADMISSION:  07/20/2003  DATE OF DISCHARGE:                                HISTORY & PHYSICAL   CHIEF COMPLAINT:  Abdominal pain.   SUBJECTIVE:  This patient is a 40 year old white female with history of  bipolar disease and chronic recurrent pancreatitis with idiopathic etiology  who presented to the emergency room the day of admission with complaints of  abdominal pain.  The patient noted significant epigastric discomfort.  This  did not radiate.  The patient had some nausea with it.  She had a couple of  episodes of vomiting.  No change in bowels or urine.  The patient noted no  fever or chills.  The pain was fairly severe, and she came on to the  emergency room.   PAST MEDICAL HISTORY:  Significant for repeated bouts of pancreatitis in the  past including a protracted episode with a pseudocyst.   SOCIAL HISTORY:  The patient has two children, intermittently separated with  her husband.  No tobacco.  No alcohol use.   FAMILY HISTORY:  Positive for episode of pancreatitis.   REVIEW OF SYSTEMS:  Essentially, otherwise, negative.   PHYSICAL EXAMINATION:  VITAL SIGNS:  Temperature afebrile, pulse 110.  GENERAL APPEARANCE:  Alert and in some distress.  HEENT:  Normal.  NECK:  Supple.  LUNGS:  Clear.  HEART:  Regular rhythm.  ABDOMEN:  Epigastrium is tender to palpation.  No rebound or guarding.  No  CVA tenderness.  EXTREMITIES:  normal.   IMPRESSION:  1. Acute pancreatitis with amylase and lipase up at approximately 110 and     130, respectively.  2.     History of chronic pancreatitis.  3. Bipolar disorder.   PLAN:  IV fluids, pain control.  Further orders as noted in the  chart.    ___________________________________________                                         Donna Bernard, M.D.   WSL/MEDQ  D:  07/22/2003  T:  07/22/2003  Job:  [id number]

## 2011-02-23 NOTE — Procedures (Signed)
   NAME:  [name] [name], [name] [name]                          ACCOUNT NO.:  [id number]   MEDICAL RECORD NO.:  [id number]                   PATIENT TYPE:  INP   LOCATION:  A204                                 FACILITY:  [facility]   PHYSICIAN:  Donna Bernard, M.D.             DATE OF BIRTH:  [birth year]   DATE OF PROCEDURE:  DATE OF DISCHARGE:  05/06/2002                                EKG INTERPRETATION   Electrocardiogram reveals sinus tachycardia with no significant ST-T  changes.                                               Donna Bernard, M.D.    [name]  D:  05/06/2002  T:  05/12/2002  Job:  [id number]

## 2011-02-23 NOTE — Discharge Summary (Signed)
NAME:  [name] [name], [name] [name]                ACCOUNT NO.:  [id number]   MEDICAL RECORD NO.:  [id number]          PATIENT TYPE:  OIB   LOCATION:  9302                          FACILITY:  [facility]   PHYSICIAN:  Guy Sandifer. Henderson Cloud, M.D. DATE OF BIRTH:  [birth year]   DATE OF ADMISSION:  12/16/2006  DATE OF DISCHARGE:  12/17/2006                               DISCHARGE SUMMARY   ADMISSION DIAGNOSES:  1. Dysmenorrhea.  2. Menorrhagia.   DISCHARGE DIAGNOSES:  1. Dysmenorrhea.  2. Menorrhagia.   PROCEDURE:  On December 16, 2006, laparoscopically-assisted vaginal  hysterectomy and cystoscopy.   REASON FOR ADMISSION:  This patient is a 40 year old separated white  female, G2, P2, status post tubal ligation, with 2 years of heavy,  painful menses.  Details are dictated in the dictated history and  physical.  She is admitted to the hospital for surgical management.   HOSPITAL COURSE:  The patient is admitted to the hospital and undergoes  the above procedure.  Estimated blood loss is 200 mL.  On the evening of  surgery she is having some cramping and no flatus.  Vital signs are  stable.  She is afebrile with clear urine output.  She is treated with  Toradol that evening with good relief of her cramping.  On the morning  of discharge she is passing flatus and ambulating.  She had some nausea  with breakfast but no vomiting.  Vital signs are stable and she is  afebrile.  Hemoglobin is 11.0 and pathology is pending.  Abdomen is soft  with positive bowel sounds.  She will be observed until lunch and then  discharged home if she does well with lunch.   CONDITION ON DISCHARGE:  Good.   DIET:  Regular as tolerated.   ACTIVITY:  No lifting or operation of automobiles, no vaginal entry.   She is to call the office for problems including but not limited to  increasing pain, persistent nausea or vomiting, heavy vaginal bleeding  or temperature of 101 degrees.  Follow-up is in the office in 2 weeks.    MEDICATIONS:  1. Percocet 5/325 mg #40 one to two p.o. q.6h. p.r.n.  2. Phenergan 25 mg #10 one p.o. q.8h. p.r.n. nausea or vomiting.  3. Ibuprofen 600 mg q.6h. p.r.n.      Guy Sandifer Henderson Cloud, M.D.  Electronically Signed     JET/MEDQ  D:  12/17/2006  T:  12/17/2006  Job:  [id number]

## 2011-02-23 NOTE — H&P (Signed)
NAME:  [name] [name], [name] [name]                ACCOUNT NO.:  [id number]   MEDICAL RECORD NO.:  [id number]          PATIENT TYPE:  AMB   LOCATION:                                FACILITY:  [facility]   PHYSICIAN:  Guy Sandifer. Henderson Cloud, M.D. DATE OF BIRTH:  August 08, 1971   DATE OF ADMISSION:  DATE OF DISCHARGE:                              HISTORY & PHYSICAL   CHIEF COMPLAINT:  Heavy, painful menses.   HISTORY OF PRESENT ILLNESS:  This patient is a 40 year old separated  white female, G2, P2, status post tubal ligation with at least 2 years  of heavy painful menses.  She will start having pain 2-3 days prior to  her menses.  She has heavy menses with clots changing a pad q.2 h. for  two to three days at a time.  Ultrasound in my office reveals a uterus  measuring 8.3 x 4.8 x 5.4 cm.  A sonohistogram was negative for  intracavitary masses, and the ovaries appeared normal.  After discussion  of the options, she is being admitted for laparoscopically assisted  vaginal hysterectomy. The potential risks and complications have been  reviewed preoperatively.   PAST MEDICAL HISTORY:  Bipolar disorder.   PAST SURGICAL HISTORY:  Laparoscopic Nissen.   OBSTETRIC HISTORY:  Cesarean section x2.   FAMILY HISTORY:  Diabetes in paternal grandmother and father.  High  blood pressure paternal grandmother.   MEDICATIONS:  1. Lithium 300 mg 2 in the morning and 3 in the evening.  2. Neurontin 800 mg 2 in the evening.  3. Klonopin 0.5 mg h.s. for sleep.  4. Ambien 10 mg at h.s. for sleep.   ALLERGIES:  REGLAN leading to jaw and neck stiffness; Phenergan is okay.   REVIEW OF SYSTEMS:  NEUROLOGIC:  Denies headache.  CARDIAC:  Denies  chest pain.  PULMONARY:  Denies shortness of breath.  GI:  Denies recent  changes in bowel habits.   SOCIAL HISTORY:  Denies tobacco, alcohol, or drug abuse.   PHYSICAL EXAMINATION:  VITAL SIGNS:  Height 5 feet 4 inches, weight 208  pounds.  Blood pressure 110/72.  HEENT:  Without  thyromegaly.  LUNGS:  Clear to auscultation.  HEART:  Regular rate and rhythm.  BACK:  Without CVA tenderness.  BREASTS:  Not examined.  ABDOMEN:  Soft, nontender without masses.  PELVIC EXAM:  Vulva, vagina, and cervix without lesion.  Uterus normal  size, mobile and nontender.  Adnexa nontender without masses.  EXTREMITIES:  Grossly within normal limits.  NEUROLOGIC:  Grossly within normal limits.   ASSESSMENT:  Dysmenorrhea, menorrhagia.   PLAN:  Laparoscopically-assisted vaginal hysterectomy.      Guy Sandifer Henderson Cloud, M.D.  Electronically Signed     JET/MEDQ  D:  12/11/2006  T:  12/11/2006  Job:  [id number]

## 2011-02-23 NOTE — Consult Note (Signed)
[facility]  Patient:    [name] [name], [name] [name] Visit Number: [id number] MRN: [id number]          Service Type: MED Location: ICCU AO13 08 Attending Physician:  Rosalyn Charters Dictated by:   Maudry Diego, M.D. Proc. Date: 04/29/02 Admit Date:  04/28/2002                            Consultation Report  ATTENDING PHYSICIAN:  Dr. Gerda Diss  REASON FOR CONSULTATION:  Lithium toxicity.  HISTORY OF PRESENT ILLNESS:  [name] [name] is a 40 year old white female with past medical history of bipolar and history of depression.  She presently was brought to the emergency room yesterday because of overdose secondary to suicidal ideation.  According to her husband, the patient has ingested lots of Seroquel.  However, since she was taking lithium she was not taking also lithium but she was not aware of it.  At this moment, since the patient seems to be somewhat confused.  No additional history was available.  According to her husband, there is no history of hypertension.  There is no history of diabetes or renal insufficiency.  CURRENT MEDICATIONS: 1. Paxil CR 25 mg two tablets q.a.m. 2. Seroquel 300 mg p.o. q.d. 3. Xanax 1 mg p.o. q.i.d. 4. Lithobid 300 mg q.a.m. and q.h.s. 5. Ambien 10 mg p.o. q.h.s.  ALLERGIES:  REGLAN.  PAST MEDICAL HISTORY: 1. History of depression. 2. History of irritable bowel syndrome. 3. Status post cholecystectomy.  SOCIAL HISTORY:  Not available.  REVIEW OF SYSTEMS:  Barely patient answers.  There seems no shortness of breath but she dozed off.  PHYSICAL EXAMINATION:  GENERAL:  The patient is arousable, dilated pupil.  Does not follow examining orders but she tried to utter ______ words but still go back to sleep.  HEENT:  No conjunctival pallor, no icterus.  Oral mucosa seems to be dry.  NECK:  Supple.  CHEST:  Decreased breath sounds, otherwise seems to be clear.  HEART:  Regular rate and rhythm, no murmur.  ABDOMEN:   Obese, difficult to palpate organomegaly.  EXTREMITIES:  No edema.  LABORATORY DATA:  Her blood work today showed sodium 142, potassium 3.9, glucose 122, BUN 13, creatinine 1.2, calcium 8.4.  When she came creatinine was 0.7 and BUN was 7.  Tylenol less than 10.  Lithium 4.2.  Salicylate was negative.  Urine is positive for benzodiazepine.  Alcohol was 6 with reference range between 0-10.  Her white blood cell count is 14.4, hemoglobin 14, hematocrit 40.7, and platelets of 288.  Her ABG showed pH 7.3, pCO2 40, pO2 of 57.7, and percent saturation is 91.3.  ASSESSMENT: 1. Lithium toxicity, very severe.  The patient at this moment is somnolent but    arousable.  Because of significant lithium levels and decreased urine    output, we will consider this as a significant toxicity and may require    dialysis.  However, this level is from yesterday, more than 12 hours ago. 2. Depression. 3. Irritable bowel syndrome.  RECOMMENDATION:  As stated above.  Will consider dialysis today.  Will check the level post-dialysis and will check the level also in the morning.  Will use ______ for three hours today and will do the same in the morning.  Will hydrate the patient and follow her labs. Dictated by:   Maudry Diego, M.D. Attending Physician:  Rosalyn Charters DD:  04/29/02 TD:  05/03/02 Job: [id number]

## 2011-02-23 NOTE — Group Therapy Note (Signed)
   NAME:  [name] [name], [name] [name]                            ACCOUNT NO.:  [id number]   MEDICAL RECORD NO.:  [id number]                  PATIENT TYPE:   LOCATION:                                       FACILITY:   PHYSICIAN:  Donna Bernard, M.D.             DATE OF BIRTH:   DATE OF PROCEDURE:  05/04/2002  DATE OF DISCHARGE:                                   PROGRESS NOTE   SUBJECTIVE:  The patient still having spells of apparent hallucinations,  although overall her mentation has improved over the last couple of days.   PHYSICAL EXAMINATION:  VITAL SIGNS:  Vital signs are stable. Temperature is  99.6 max.  Blood pressure 130/80.  Ins and outs:  In 4200 and out 2000, over  the last 24 hours.  GENERAL:  The patient is alert, a little shaky, oriented to person, place  and off by one month as far as time. Sweating profusely, no acute distress.  LUNGS:  Clear.  No wheezing noted.  HEART: Regular rhythm.  HEENT:  Moist mucous membranes.  ABDOMEN:  Soft.   IMPRESSION:  1. Intentional overdose with Seroquel and lithium.  Status post dialysis, of     note lithium level yesterday unmeasurable.  The patient is still having     episodes of hallucinations, shakiness and slight agitation.  Overall     though, her symptoms are significantly improved.  She states currently     she is not thinking about hurting herself. She does not have complete     recollection of the event that put her in the hospital however.  The     family stated they do not want [facility], they feel like that     have not cared for her adequately in the past.  2. Pneumonic infiltrate.  We will recheck chest x-ray today.  3. Nutrition.  Suboptimum.  We will add nutritional supplement. The patient     is starting to eat better.  4. Activity level.  Will try to get up in chair today.   PLAN:  As per orders.                                               Donna Bernard, M.D.    [name]  D:  05/04/2002  T:   05/11/2002  Job:  [id number]   cc:   Donna Bernard, M.D.  [address]  [place]  [place] [address]  Fax: [phone]

## 2011-02-23 NOTE — Group Therapy Note (Signed)
   NAME:  [name] [name], [name] [name]                          ACCOUNT NO.:  [id number]   MEDICAL RECORD NO.:  [id number]                   PATIENT TYPE:  INP   LOCATION:  A328                                 FACILITY:  [facility]   PHYSICIAN:  Scott A. Gerda Diss, M.D.               DATE OF BIRTH:  08-Jun-1971   DATE OF PROCEDURE:  01/02/2003  DATE OF DISCHARGE:                                   PROGRESS NOTE   SUBJECTIVE:  Overall the patient is doing better.  She relates moderate pain  and discomfort.  She is on methadone everyday, so Demerol is not helping her  quite as much.  Will shift her over to morphine.  Awaiting the amylase  levels for this morning.   OBJECTIVE:  ABDOMEN:  Tender in the epigastric region.   ASSESSMENT:  I feel the patient is making progress but needs to stay in the  hospital.  Have spoken with Dr. Bluford Main at [facility] yesterday, and  conservative measures discussed with him and agreed upon.  Will continue  care here unless other medical problems indicate the need for transfer.                                               Scott A. Gerda Diss, M.D.    Linus Orn  D:  01/02/2003  T:  01/03/2003  Job:  [id number]

## 2011-02-23 NOTE — H&P (Signed)
NAME:  [name] [name], [name] [name]                ACCOUNT NO.:  [id number]   MEDICAL RECORD NO.:  [id number]          PATIENT TYPE:  INP   LOCATION:  A301                          FACILITY:  [facility]   PHYSICIAN:  Scott A. Gerda Diss, MD    DATE OF BIRTH:  04/25/1971   DATE OF ADMISSION:  01/25/2005  DATE OF DISCHARGE:  LH                                HISTORY & PHYSICAL   CHIEF COMPLAINT:  Abdominal pain.   HISTORY OF PRESENT ILLNESS:  This is a 40 year old white female who presents  with severe abdominal pain onset early this morning.  She states that she is  having nausea, severe abdominal pain radiates toward the back but not  through the back.  Denies any lower abdominal pain.  No dysuria.  Relates  some nausea, one episode of vomiting earlier today.  Unable to take in  liquids.  Has a long history of chronic pancreatitis.   PAST MEDICAL HISTORY:  Kidney stones, esophageal ulcers, depression,  anxiety, migraine headaches, bipolar disease, glucose intolerance and  chronic pancreatitis.  Also has had her gallbladder taken out in 2001.   FAMILY HISTORY:  Heart disease.   SOCIAL HISTORY:  Does not smoke or drink.   ALLERGIES:  Relates REGLAN causes problems.   MEDICATIONS:  1.  Lithium 300 mg, she takes one in the morning, two in the evening.  2.  Phenergan on a p.r.n. basis.  3.  She does not use Seroquel.  4.  She does take 5 mg of Ambien q.h.s. p.r.n.   FAMILY HISTORY:  Heart disease.   REVIEW OF SYSTEMS:  See per above.   PHYSICAL EXAMINATION:  VITAL SIGNS:  TMs NLT/NL.  NECK:  Supple.  CHEST:  CTA.  No crackles.  CARDIAC:  Heart is regular.  ABDOMEN:  Soft, obese, mild epigastric pain and discomfort.  EXTREMITIES:  No edema.   White count 9.5, hemoglobin 15.2.  Sodium 137, potassium 3.6, glucose 130,  BUN 9, creatinine 0.9.  Lipase 312.  Urine pregnancy negative.  Lithium  level 0.69.  Urinalysis essentially negative.   ASSESSMENT AND PLAN:  Pancreatitis.  Pain medication, IV  fluids, bowel rest.  Monitor patient closely.  Should turn the corner over the course of the next  several days.  Has been down this road before, but we will watch closely for  any complications.      SAL/MEDQ  D:  01/26/2005  T:  01/26/2005  Job:  [id number]

## 2011-02-23 NOTE — Discharge Summary (Signed)
[facility]  Patient:    [name] [name], [name] [name] Visit Number: [id number] MRN: [id number]          Service Type: PSY Location: 500 0500 01 Attending Physician:  Jeanice Lim Dictated by:   Elfredia Nevins, M.D. Admit Date:  12/01/2001 Disc. Date: 12/01/01                             Discharge Summary  DATE OF BIRTH:  Nov 17, 1970.  DIAGNOSIS:  Nontoxic overdose.  DISPOSITION:  Discharged, unknown medications as patient was transferred to psychiatric care.  SUMMARY:  The patient was admitted after alleging that she overdosed on benzodiazepines and opiates. She had presented to the emergency department x two; the first time seeking narcotics, the second time stating she overdosed on narcotics after having benzodiazepines. She stated she was "tired of life". This indicated suicidal ideation, and she was subsequently observed in the ICU after gastric decontamination was performed. It should be noted that her GI tract was decontaminated within 30 minutes of her alleged ingestion. She had nontoxic Tylenol level and was discharged to psychiatric care after evaluation in house by behavioral health. Dictated by:   Elfredia Nevins, M.D. Attending Physician:  Jeanice Lim DD:  12/06/01 TD:  12/07/01 Job: [id number]

## 2011-02-23 NOTE — H&P (Signed)
NAME:  [name] [name], [name] [name]                          ACCOUNT NO.:  [id number]   MEDICAL RECORD NO.:  [id number]                   PATIENT TYPE:  INP   LOCATION:  A329                                 FACILITY:  [facility]   PHYSICIAN:  Donna Bernard, M.D.             DATE OF BIRTH:  1971/01/05   DATE OF ADMISSION:  08/10/2003  DATE OF DISCHARGE:                                HISTORY & PHYSICAL   CHIEF COMPLAINT:  Abdominal pain.   SUBJECTIVE:  This patient is a 40 year old white female with history of  chronic recurrent pancreatitis.  She presented to the office the day prior  to admission with complaints of mild epigastric discomfort.  That particular  visit was follow up for a flare of pancreatitis which occurred approximately  two weeks ago.  The patient did have some significant nausea.  The pain did  not radiate any significantly.  The patient had no diarrhea.  She had no  blood in her stools.  She felt nausea, but had no actual vomiting.  She  complained of no urinary problems.   PAST MEDICAL HISTORY:  1. Recurrent pancreatitis with complication of significant pseudocyst     requiring surgery last year.  2. Pancreatic sphincterotomy.  3. Other surgeries include cholecystectomy in 2001.  4. Bipolar disease including a history of intentional overdose.  The patient     currently claims no significant depression.   SOCIAL HISTORY:  The patient is married with two children.  No alcohol use.  No tobacco use.   MEDICATIONS:  1. Seroquel 600 mg p.o. q.h.s.  2. Phenergan tablets p.r.n.   REVIEW OF SYSTEMS:  Otherwise, negative.   ALLERGIES:  REGLAN.   FAMILY HISTORY:  1. Distant relative with history of pancreatitis with unknown etiology.  2. Type II diabetes.  3. Fibromyalgia in the family.   PHYSICAL EXAMINATION:  GENERAL APPEARANCE:  The patient is alert in no acute  distress.  No jaundice.  VITAL SIGNS:  Afebrile.  Blood pressure 102/60, pulse 90.  HEENT:  Normal.  NECK:   Supple.  Some obesity.  LUNGS:  Clear.  HEART:  Regular rate and rhythm.  ABDOMEN:  Positive epigastric tenderness.  No obvious rebound or guarding.  EXTREMITIES:  Normal.  NEUROLOGICAL:  Intact.   LABORATORY DATA:  Amylase, lipase 90/52.  Of note, the patient often has  normal amylase/lipase at the start of pancreatitis bouts.   IMPRESSION:  Recurrence of pancreatitis.   PLAN:  IV fluids, pain medications.  Will consult Dr. [name] and Dr. Karilyn Cota  tomorrow due to frequent nature of the attacks.  Further orders as noted in  the chart.     ___________________________________________  Donna Bernard, M.D.   [name]  D:  08/13/2003  T:  08/13/2003  Job:  [id number]

## 2011-02-23 NOTE — Procedures (Signed)
NAME:  [name] [name], [name] [name]              ACCOUNT NO.:  [id number]   MEDICAL RECORD NO.:  [id number]          PATIENT TYPE:  OUT   LOCATION:  SLEEP LAB                     FACILITY:  [facility]   PHYSICIAN:  Marcelyn Bruins, M.D. [facility] DATE OF BIRTH:  [birth year]   DATE OF ADMISSION:  05/07/2004  DATE OF DISCHARGE:  05/07/2004                              NOCTURNAL POLYSOMNOGRAM   REFERRING PHYSICIAN:  Donna Bernard, M.D.   INDICATION FOR STUDY:  Hypersomnia with sleep apnea.   SLEEP ARCHITECTURE:  The patient had a total sleep time of 435 minutes with  decreased REM and slow wave sleep.  She had a normal sleep latency of 17  minutes however, took an Ambien prior to lights out.  REM onset was  prolonged at 220 minutes.   IMPRESSION:  1. Small numbers of obstructive and central events which do not meet the     Respiratory Disturbance Index criteria for the obstructive sleep apnea     syndrome.  Patient therefore did not meet split-night criteria.  There     was very mild O2 desaturation as low as 85% that was transient.  2. Moderate snoring noted throughout the study.  3. No clinically significant cardiac arrhythmias.  4. Large numbers of leg jerks with significant sleep disruption.  Clinical     correlation is suggested.  Does the patient have a history consistent     with a restless leg syndrome?                                   ______________________________                                Marcelyn Bruins, M.D. [facility]     KC/MEDQ  D:  05/10/2004 15:41:50  T:  05/11/2004 07:37:04  Job:  [id number]

## 2011-02-23 NOTE — Group Therapy Note (Signed)
   NAME:  [name], [name] R                          ACCOUNT NO.:  [id number]   MEDICAL RECORD NO.:  [id number]                   PATIENT TYPE:  INP   LOCATION:  A328                                 FACILITY:  [facility]   PHYSICIAN:  Scott A. Gerda Diss, M.D.               DATE OF BIRTH:  [birth year]   DATE OF PROCEDURE:  DATE OF DISCHARGE:                                   PROGRESS NOTE   The patient, overall, states she feels somewhat better.  Her amylase has  come down from yesterday to today.  Her pain control is not that great.  We  will switch over to PCA pump.  Lungs clear.  Abdomen is soft.  Will set her  up for a CT scan tomorrow to help look at the pseudocyst to see if there is  a significant reoccurrence.                                               Scott A. Gerda Diss, M.D.    Linus Orn  D:  01/03/2003  T:  01/04/2003  Job:  [id number]

## 2011-02-24 ENCOUNTER — Inpatient Hospital Stay (HOSPITAL_COMMUNITY): Payer: Medicare Other

## 2011-02-24 LAB — POCT PREGNANCY, URINE: Preg Test, Ur: NEGATIVE

## 2011-02-24 LAB — URINALYSIS, ROUTINE W REFLEX MICROSCOPIC
Hgb urine dipstick: NEGATIVE
Ketones, ur: 40 mg/dL — AB
Leukocytes, UA: NEGATIVE
Protein, ur: 30 mg/dL — AB
Urobilinogen, UA: 1 mg/dL (ref 0.0–1.0)

## 2011-02-24 LAB — MRSA PCR SCREENING: MRSA by PCR: NEGATIVE

## 2011-02-24 LAB — URINE MICROSCOPIC-ADD ON

## 2011-02-24 LAB — HEMOGLOBIN AND HEMATOCRIT, BLOOD: Hemoglobin: 12.9 g/dL (ref 12.0–15.0)

## 2011-02-24 MED ORDER — IOHEXOL 350 MG/ML SOLN
100.0000 mL | Freq: Once | INTRAVENOUS | Status: AC | PRN
  Administered 2011-02-24: 100 mL via INTRAVENOUS

## 2011-02-24 MED ORDER — IOHEXOL 300 MG/ML  SOLN: 100.0000 mL | Freq: Once | INTRAMUSCULAR | Status: DC | PRN

## 2011-02-24 NOTE — Group Therapy Note (Signed)
  NAME:  Schiavi, Zhania                ACCOUNT NO.:  [id number]  MEDICAL RECORD NO.:  [id number]           PATIENT TYPE:  I  LOCATION:  IC12                          FACILITY:  [facility]  PHYSICIAN:  Wilson Singer, M.D.DATE OF BIRTH:  [birth year]  DATE OF PROCEDURE:  02/24/2011 DATE OF DISCHARGE:                                PROGRESS NOTE   I reviewed this patient and she looks worse to me.  She remains relatively hypotensive with a blood pressure just hovering at a 100 systolic, but a resting tachycardia of 145-150 in sinus rhythm.  She looks pale.  Her abdomen is soft, but tender and generalized, and bowel sounds are absent.  I feel that she is in a shock state probably, but some of her tachycardia may be related to pain, which she is complaining of.  We will give her intravenous Dilaudid as this seems to work for her.  I have spoken to Dr. Christie Beckers who is a Careers adviser at [facility] who knows this patient very well and looked after her recently from the middle of April to the beginning of May and she has accepted this patient in transfer to [facility].  We will go ahead and get a CT scan of the abdomen and hopefully she can be transferred soon.     Wilson Singer, M.D.     NCG/MEDQ  D:  02/24/2011  T:  02/24/2011  Job:  [id number]  Electronically Signed by Lilly Cove M.D. on 02/24/2011 05:06:30 PM

## 2011-02-24 NOTE — Discharge Summary (Signed)
  NAME:  [name] [name], [name] [name]                ACCOUNT NO.:  [id number]  MEDICAL RECORD NO.:  [id number]           PATIENT TYPE:  I  LOCATION:  IC12                          FACILITY:  [facility]  PHYSICIAN:  Wilson Singer, M.D.DATE OF BIRTH:  04-15-1971  DATE OF ADMISSION:  02/23/2011 DATE OF DISCHARGE:  05/19/2012LH                              DISCHARGE SUMMARY   Date of discharge and transfer to [facility] on Feb 24, 2011.  FINAL DISCHARGE DIAGNOSES: 1. Anastomotic leak intra-abdominally 2. Hypotensive and tachycardia with sepsis and peritonitis.  HISTORY:  This is a 40 year old lady was admitted with nausea, vomiting, and abdominal pain.  She had this 12-24 hours.  In the emergency room, she was found to be hypotensive and tachycardic.  She was still mentating however.  The patient was given intravenous fluids and boluses of fluids and also intravenous antibiotics with IV Zosyn.  CT scan of the abdomen was done and this showed a large amount of free intraperitoneal gas, free fluid, and oral contrast within the peritoneal space where some ruptured bowel.  The fluid and oral contrast was intimately associated with region stomach and anastomotic leak at the antrectomy site or devitalization of bowel were suggested.  In view of the fact that the patient had surgery at [facility], I have been in contact with the surgeons at [facility] who accepted the patient and the patient will be transferred emergently to [facility] for presumably emergent surgery. Please see the initial history and physical examination that was done by Dr. Karilyn Cota, earlier.  CONDITION ON THIS TRANSFER TO [facility]:  Stable.  Transfer to [facility] is emergent.     Wilson Singer, M.D.     NCG/MEDQ  D:  02/24/2011  T:  02/24/2011  Job:  [id number]  Electronically Signed by Lilly Cove M.D. on 02/24/2011 05:05:40 PM

## 2011-02-24 NOTE — H&P (Signed)
NAME:  [name], [name]                ACCOUNT NO.:  [id number]  MEDICAL RECORD NO.:  [id number]           PATIENT TYPE:  LOCATION:                                 FACILITY:  PHYSICIAN:  Wilson Singer, M.D.DATE OF BIRTH:  05-01-1971  DATE OF ADMISSION:  02/24/2011 DATE OF DISCHARGE:  LH                             HISTORY & PHYSICAL   CHIEF COMPLAINT:  Nausea and vomiting.  HISTORY OF PRESENT ILLNESS:  This 40 year old lady presents with 12-24- hour history of persistent nausea and vomiting.  She was recently discharged from [facility] on Feb 10, 2011, having been admitted there for persistent nausea and vomiting.  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  She has been having episodes of acute on chronic pancreatitis and on this admission at [facility], there was evidence of biliary reflux, pancreatic duct stricture, and she underwent a pancreatic duodenectomy with placement of a PEG tube.  There was repair of mesenteric defect in reduction of an internal hernia.  A feeding tube was placed on February 02, 2011, and the patient had not been started on feeding as of yet.  In the emergency room here, she has been given at least 2.5-3 L of fluid and she still remains tachycardic.  At one point, her tachycardia was at 170, probably SVT, but now has come back to sinus rhythm.  She has pain in her abdomen also which is quite severe and the patient has been on chronic opiates for sometime.  PAST MEDICAL HISTORY:  Significant for: 1. Acute on chronic pancreatitis as mentioned above. 2. 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 3. History of gastroesophageal reflux disease. 4. Status post Nissen fundoplication 5. Bipolar disorder. 6. Chronic opioid use 7. Renal stones.  PAST SURGICAL HISTORY: 1. Hysterectomy in 2007. 2. Cholecystectomy in 2001. 3. Laparoscopic Nissen fundoplication in 2007. 4.  Cesarean section twice in the past.  SOCIAL HISTORY:  She has been married for 18 years and lives with her husband.  She does not smoke, does not drink alcohol.  ALLERGIES:  REGLAN which produces a locked jaw.  MEDICATIONS:  Oxycodone/acetaminophen, Protonix, ondansetron, OxyContin, promethazine, Celebrex, gabapentin.  FAMILY HISTORY:  Noncontributory at this point.  REVIEW OF SYSTEMS:  Apart from the symptoms mentioned above, there are no other symptoms referable to all systems reviewed.  PHYSICAL EXAMINATION:  VITAL SIGNS:  Temperature 97.8, blood pressure 83/58.  She tends to have a low blood pressure according to her also. The highest blood pressure record in the emergency room was 131/97, pulse is 120, now in sinus rhythm; respiratory rate 14, saturation 91% on room air. GENERAL:  She does not look septic.  She does look somewhat dehydrated. She looks somewhat pale. HEART:  Heart sounds are present with a resting tachycardia. LUNGS:  Lung fields are clear anteriorly. ABDOMEN:  A midline laparotomy scar with healing stitches.  It is somewhat distended and it is tender without any bowel sounds, in fact, she is tender to superficial palpation. NEUROLOGICAL:  She is alert and oriented without  any focal neurologic signs. SKIN:  There are no obvious skin lesions or rashes or anything to indicate cellulitis.  INVESTIGATIONS:  Hemoglobin 10.5 pre IV fluids, white blood cell count 8.4, platelets 274.  Sodium 140, potassium 3.3, bicarbonate 23, BUN 13, creatinine 0.63.  Alkaline phosphatase very minimally elevated at 119, AST 70, ALT 57, calcium 10.5, albumin 3.8; lipase elevated at 108, upper limit of normal 59.  Repeat hemoglobin was still reasonable at 12.9. Urinalysis unremarkable.  PROBLEM LIST: 1. Acute on chronic pancreatitis status post Whipple and status post     recent percutaneous endoscopic gastrostomy tube insertion at [facility]. 2. Hypovolemia and dehydration. 3. Type 2  diabetes. 4. Bipolar disorder. 5. Chronic opioid use.  PLAN: 1. Admit to step down. 2. Intravenous fluids. 3. Intravenous opioids. 4. CT of the abdomen.  Further recommendations will depend on the patient's hospital progress.     Wilson Singer, M.D.     NCG/MEDQ  D:  02/24/2011  T:  02/24/2011  Job:  [id number]  cc:   Donna Bernard, M.D. Fax: [phone]  Electronically Signed by Lilly Cove M.D. on 02/24/2011 05:04:54 PM

## 2011-02-25 LAB — URINE CULTURE: Culture: NO GROWTH

## 2011-02-26 LAB — GLUCOSE, CAPILLARY

## 2011-07-09 LAB — DIFFERENTIAL
Basophils Absolute: 0
Basophils Relative: 0
Lymphocytes Relative: 31
Monocytes Absolute: 0.3
Neutro Abs: 3.9

## 2011-07-09 LAB — BASIC METABOLIC PANEL
BUN: 12
CO2: 23
Calcium: 8.7
Creatinine, Ser: 0.72
GFR calc non Af Amer: >60
Glucose, Bld: 153 — ABNORMAL HIGH
Sodium: 136

## 2011-07-09 LAB — URINALYSIS, ROUTINE W REFLEX MICROSCOPIC
Leukocytes, UA: NEGATIVE
Nitrite: POSITIVE — AB
Specific Gravity, Urine: >1.03 — ABNORMAL HIGH
Urobilinogen, UA: 1
pH: 6.5

## 2011-07-09 LAB — URINE MICROSCOPIC-ADD ON

## 2011-07-09 LAB — CBC
MCHC: 34.7
Platelets: 234
RDW: 12.5

## 2011-07-17 LAB — CBC
HCT: 31 — ABNORMAL LOW
HCT: 41.5
Hemoglobin: 14.3
MCV: 99.7
Platelets: 198
Platelets: 235
Platelets: 260
RBC: 3.11 — ABNORMAL LOW
RDW: 11.8
RDW: 12.3
WBC: 5.3
WBC: 5.9
WBC: 8.2

## 2011-07-17 LAB — DIFFERENTIAL
Basophils Absolute: 0
Basophils Absolute: 0
Basophils Relative: 0
Eosinophils Absolute: 0.1
Eosinophils Relative: 1
Eosinophils Relative: 2
Lymphocytes Relative: 35
Lymphocytes Relative: 45
Lymphs Abs: 1.9
Lymphs Abs: 2.6
Monocytes Absolute: 0.4
Monocytes Relative: 5
Monocytes Relative: 5
Neutro Abs: 2.7
Neutro Abs: 6.1

## 2011-07-17 LAB — COMPREHENSIVE METABOLIC PANEL
ALT: 73 — ABNORMAL HIGH
AST: 36
Albumin: 3.3 — ABNORMAL LOW
Alkaline Phosphatase: 55
BUN: 5 — ABNORMAL LOW
Chloride: 109
GFR calc Af Amer: >60
Potassium: 4.2
Sodium: 141
Total Bilirubin: 1.1
Total Protein: 5.7 — ABNORMAL LOW

## 2011-07-17 LAB — BASIC METABOLIC PANEL
BUN: 4 — ABNORMAL LOW
Calcium: 8.9
GFR calc non Af Amer: >60
Glucose, Bld: 169 — ABNORMAL HIGH
Potassium: 3.7
Sodium: 138

## 2011-07-17 LAB — LIPASE, BLOOD
Lipase: 112 — ABNORMAL HIGH
Lipase: 389 — ABNORMAL HIGH
Lipase: 632 — ABNORMAL HIGH

## 2011-07-17 LAB — URINALYSIS, ROUTINE W REFLEX MICROSCOPIC
Nitrite: NEGATIVE
Specific Gravity, Urine: 1.01
Urobilinogen, UA: 0.2
pH: 7

## 2011-07-17 LAB — AMYLASE
Amylase: 169 — ABNORMAL HIGH
Amylase: 245 — ABNORMAL HIGH
Amylase: 70

## 2011-07-17 LAB — LITHIUM LEVEL: Lithium Lvl: 1.12

## 2011-09-20 ENCOUNTER — Other Ambulatory Visit: Payer: Self-pay | Admitting: Family Medicine

## 2011-09-20 DIAGNOSIS — Z139 Encounter for screening, unspecified: Secondary | ICD-10-CM

## 2011-09-25 ENCOUNTER — Ambulatory Visit (HOSPITAL_COMMUNITY): Admission: RE | Admit: 2011-09-25 | Payer: Medicare Other | Source: Ambulatory Visit

## 2011-10-11 DIAGNOSIS — Z9089 Acquired absence of other organs: Secondary | ICD-10-CM | POA: Diagnosis not present

## 2011-10-11 DIAGNOSIS — E891 Postprocedural hypoinsulinemia: Secondary | ICD-10-CM | POA: Diagnosis present

## 2011-10-11 DIAGNOSIS — K861 Other chronic pancreatitis: Secondary | ICD-10-CM | POA: Diagnosis not present

## 2011-10-11 DIAGNOSIS — R7309 Other abnormal glucose: Secondary | ICD-10-CM | POA: Diagnosis not present

## 2011-10-11 DIAGNOSIS — K219 Gastro-esophageal reflux disease without esophagitis: Secondary | ICD-10-CM | POA: Diagnosis present

## 2011-10-11 DIAGNOSIS — F319 Bipolar disorder, unspecified: Secondary | ICD-10-CM | POA: Diagnosis present

## 2011-10-11 DIAGNOSIS — E139 Other specified diabetes mellitus without complications: Secondary | ICD-10-CM | POA: Diagnosis present

## 2011-10-11 DIAGNOSIS — K7689 Other specified diseases of liver: Secondary | ICD-10-CM | POA: Diagnosis not present

## 2011-10-11 DIAGNOSIS — R7989 Other specified abnormal findings of blood chemistry: Secondary | ICD-10-CM | POA: Diagnosis not present

## 2011-10-11 DIAGNOSIS — R109 Unspecified abdominal pain: Secondary | ICD-10-CM | POA: Diagnosis not present

## 2011-10-11 DIAGNOSIS — R188 Other ascites: Secondary | ICD-10-CM | POA: Diagnosis not present

## 2011-10-11 DIAGNOSIS — Z90410 Acquired total absence of pancreas: Secondary | ICD-10-CM | POA: Diagnosis not present

## 2011-10-15 DIAGNOSIS — Z794 Long term (current) use of insulin: Secondary | ICD-10-CM | POA: Diagnosis not present

## 2011-10-15 DIAGNOSIS — R112 Nausea with vomiting, unspecified: Secondary | ICD-10-CM | POA: Diagnosis not present

## 2011-10-15 DIAGNOSIS — R7309 Other abnormal glucose: Secondary | ICD-10-CM | POA: Diagnosis not present

## 2011-10-15 DIAGNOSIS — K859 Acute pancreatitis without necrosis or infection, unspecified: Secondary | ICD-10-CM | POA: Diagnosis not present

## 2011-10-15 DIAGNOSIS — K5900 Constipation, unspecified: Secondary | ICD-10-CM | POA: Diagnosis not present

## 2011-10-17 DIAGNOSIS — Z794 Long term (current) use of insulin: Secondary | ICD-10-CM | POA: Diagnosis not present

## 2011-10-17 DIAGNOSIS — K859 Acute pancreatitis without necrosis or infection, unspecified: Secondary | ICD-10-CM | POA: Diagnosis not present

## 2011-10-17 DIAGNOSIS — R7309 Other abnormal glucose: Secondary | ICD-10-CM | POA: Diagnosis not present

## 2011-10-17 DIAGNOSIS — K5900 Constipation, unspecified: Secondary | ICD-10-CM | POA: Diagnosis not present

## 2011-10-17 DIAGNOSIS — R112 Nausea with vomiting, unspecified: Secondary | ICD-10-CM | POA: Diagnosis not present

## 2011-10-19 DIAGNOSIS — R112 Nausea with vomiting, unspecified: Secondary | ICD-10-CM | POA: Diagnosis not present

## 2011-10-19 DIAGNOSIS — Z794 Long term (current) use of insulin: Secondary | ICD-10-CM | POA: Diagnosis not present

## 2011-10-19 DIAGNOSIS — K5900 Constipation, unspecified: Secondary | ICD-10-CM | POA: Diagnosis not present

## 2011-10-19 DIAGNOSIS — R7309 Other abnormal glucose: Secondary | ICD-10-CM | POA: Diagnosis not present

## 2011-10-19 DIAGNOSIS — K859 Acute pancreatitis without necrosis or infection, unspecified: Secondary | ICD-10-CM | POA: Diagnosis not present

## 2011-10-22 DIAGNOSIS — R112 Nausea with vomiting, unspecified: Secondary | ICD-10-CM | POA: Diagnosis not present

## 2011-10-22 DIAGNOSIS — Z794 Long term (current) use of insulin: Secondary | ICD-10-CM | POA: Diagnosis not present

## 2011-10-22 DIAGNOSIS — K5900 Constipation, unspecified: Secondary | ICD-10-CM | POA: Diagnosis not present

## 2011-10-22 DIAGNOSIS — K859 Acute pancreatitis without necrosis or infection, unspecified: Secondary | ICD-10-CM | POA: Diagnosis not present

## 2011-10-22 DIAGNOSIS — R7309 Other abnormal glucose: Secondary | ICD-10-CM | POA: Diagnosis not present

## 2011-10-24 DIAGNOSIS — G43909 Migraine, unspecified, not intractable, without status migrainosus: Secondary | ICD-10-CM | POA: Diagnosis not present

## 2011-10-25 DIAGNOSIS — T182XXA Foreign body in stomach, initial encounter: Secondary | ICD-10-CM | POA: Diagnosis not present

## 2011-10-25 DIAGNOSIS — Z931 Gastrostomy status: Secondary | ICD-10-CM | POA: Diagnosis not present

## 2011-10-25 DIAGNOSIS — E1065 Type 1 diabetes mellitus with hyperglycemia: Secondary | ICD-10-CM | POA: Diagnosis not present

## 2011-10-25 DIAGNOSIS — K2970 Gastritis, unspecified, without bleeding: Secondary | ICD-10-CM | POA: Diagnosis not present

## 2011-10-25 DIAGNOSIS — K7689 Other specified diseases of liver: Secondary | ICD-10-CM | POA: Diagnosis not present

## 2011-10-25 DIAGNOSIS — R609 Edema, unspecified: Secondary | ICD-10-CM | POA: Diagnosis not present

## 2011-10-25 DIAGNOSIS — E46 Unspecified protein-calorie malnutrition: Secondary | ICD-10-CM | POA: Diagnosis present

## 2011-10-25 DIAGNOSIS — Z9089 Acquired absence of other organs: Secondary | ICD-10-CM | POA: Diagnosis not present

## 2011-10-25 DIAGNOSIS — K219 Gastro-esophageal reflux disease without esophagitis: Secondary | ICD-10-CM | POA: Diagnosis present

## 2011-10-25 DIAGNOSIS — R1012 Left upper quadrant pain: Secondary | ICD-10-CM | POA: Diagnosis not present

## 2011-10-25 DIAGNOSIS — R933 Abnormal findings on diagnostic imaging of other parts of digestive tract: Secondary | ICD-10-CM | POA: Diagnosis not present

## 2011-10-25 DIAGNOSIS — R627 Adult failure to thrive: Secondary | ICD-10-CM | POA: Diagnosis present

## 2011-10-25 DIAGNOSIS — R1084 Generalized abdominal pain: Secondary | ICD-10-CM | POA: Diagnosis not present

## 2011-10-25 DIAGNOSIS — R7989 Other specified abnormal findings of blood chemistry: Secondary | ICD-10-CM | POA: Diagnosis not present

## 2011-10-25 DIAGNOSIS — Z794 Long term (current) use of insulin: Secondary | ICD-10-CM | POA: Diagnosis not present

## 2011-10-25 DIAGNOSIS — K560 Paralytic ileus: Secondary | ICD-10-CM | POA: Diagnosis present

## 2011-10-25 DIAGNOSIS — E891 Postprocedural hypoinsulinemia: Secondary | ICD-10-CM | POA: Diagnosis not present

## 2011-10-25 DIAGNOSIS — K589 Irritable bowel syndrome without diarrhea: Secondary | ICD-10-CM | POA: Diagnosis not present

## 2011-10-25 DIAGNOSIS — R599 Enlarged lymph nodes, unspecified: Secondary | ICD-10-CM | POA: Diagnosis not present

## 2011-10-25 DIAGNOSIS — Z431 Encounter for attention to gastrostomy: Secondary | ICD-10-CM | POA: Diagnosis not present

## 2011-10-25 DIAGNOSIS — Z9049 Acquired absence of other specified parts of digestive tract: Secondary | ICD-10-CM | POA: Diagnosis not present

## 2011-10-25 DIAGNOSIS — K649 Unspecified hemorrhoids: Secondary | ICD-10-CM | POA: Diagnosis present

## 2011-10-25 DIAGNOSIS — F319 Bipolar disorder, unspecified: Secondary | ICD-10-CM | POA: Diagnosis present

## 2011-10-25 DIAGNOSIS — R339 Retention of urine, unspecified: Secondary | ICD-10-CM | POA: Diagnosis not present

## 2011-10-25 DIAGNOSIS — Z934 Other artificial openings of gastrointestinal tract status: Secondary | ICD-10-CM | POA: Diagnosis not present

## 2011-10-25 DIAGNOSIS — R112 Nausea with vomiting, unspecified: Secondary | ICD-10-CM | POA: Diagnosis not present

## 2011-10-25 DIAGNOSIS — K6389 Other specified diseases of intestine: Secondary | ICD-10-CM | POA: Diagnosis not present

## 2011-10-25 DIAGNOSIS — R338 Other retention of urine: Secondary | ICD-10-CM | POA: Diagnosis present

## 2011-10-25 DIAGNOSIS — K929 Disease of digestive system, unspecified: Secondary | ICD-10-CM | POA: Diagnosis present

## 2011-10-25 DIAGNOSIS — D849 Immunodeficiency, unspecified: Secondary | ICD-10-CM | POA: Diagnosis present

## 2011-10-25 DIAGNOSIS — Z049 Encounter for examination and observation for unspecified reason: Secondary | ICD-10-CM | POA: Diagnosis not present

## 2011-10-25 DIAGNOSIS — Z4682 Encounter for fitting and adjustment of non-vascular catheter: Secondary | ICD-10-CM | POA: Diagnosis not present

## 2011-10-25 DIAGNOSIS — R109 Unspecified abdominal pain: Secondary | ICD-10-CM | POA: Diagnosis not present

## 2011-10-25 DIAGNOSIS — Z09 Encounter for follow-up examination after completed treatment for conditions other than malignant neoplasm: Secondary | ICD-10-CM | POA: Diagnosis not present

## 2011-10-25 DIAGNOSIS — R143 Flatulence: Secondary | ICD-10-CM | POA: Diagnosis not present

## 2011-10-25 DIAGNOSIS — R948 Abnormal results of function studies of other organs and systems: Secondary | ICD-10-CM | POA: Diagnosis not present

## 2011-10-25 DIAGNOSIS — E119 Type 2 diabetes mellitus without complications: Secondary | ICD-10-CM | POA: Diagnosis present

## 2011-10-25 DIAGNOSIS — R198 Other specified symptoms and signs involving the digestive system and abdomen: Secondary | ICD-10-CM | POA: Diagnosis not present

## 2011-10-25 DIAGNOSIS — IMO0002 Reserved for concepts with insufficient information to code with codable children: Secondary | ICD-10-CM | POA: Diagnosis not present

## 2011-10-25 DIAGNOSIS — K5900 Constipation, unspecified: Secondary | ICD-10-CM | POA: Diagnosis not present

## 2011-10-25 DIAGNOSIS — R141 Gas pain: Secondary | ICD-10-CM | POA: Diagnosis not present

## 2011-10-25 DIAGNOSIS — Z90411 Acquired partial absence of pancreas: Secondary | ICD-10-CM | POA: Diagnosis not present

## 2011-10-25 DIAGNOSIS — Z980 Intestinal bypass and anastomosis status: Secondary | ICD-10-CM | POA: Diagnosis not present

## 2011-10-25 DIAGNOSIS — K861 Other chronic pancreatitis: Secondary | ICD-10-CM | POA: Diagnosis not present

## 2011-10-25 DIAGNOSIS — E108 Type 1 diabetes mellitus with unspecified complications: Secondary | ICD-10-CM | POA: Diagnosis not present

## 2011-10-25 DIAGNOSIS — K8689 Other specified diseases of pancreas: Secondary | ICD-10-CM | POA: Diagnosis present

## 2011-11-30 DIAGNOSIS — K859 Acute pancreatitis without necrosis or infection, unspecified: Secondary | ICD-10-CM | POA: Diagnosis not present

## 2011-11-30 DIAGNOSIS — R7309 Other abnormal glucose: Secondary | ICD-10-CM | POA: Diagnosis not present

## 2011-11-30 DIAGNOSIS — R112 Nausea with vomiting, unspecified: Secondary | ICD-10-CM | POA: Diagnosis not present

## 2011-11-30 DIAGNOSIS — K5900 Constipation, unspecified: Secondary | ICD-10-CM | POA: Diagnosis not present

## 2011-11-30 DIAGNOSIS — Z794 Long term (current) use of insulin: Secondary | ICD-10-CM | POA: Diagnosis not present

## 2011-12-03 DIAGNOSIS — K859 Acute pancreatitis without necrosis or infection, unspecified: Secondary | ICD-10-CM | POA: Diagnosis not present

## 2011-12-03 DIAGNOSIS — Z794 Long term (current) use of insulin: Secondary | ICD-10-CM | POA: Diagnosis not present

## 2011-12-03 DIAGNOSIS — K5900 Constipation, unspecified: Secondary | ICD-10-CM | POA: Diagnosis not present

## 2011-12-03 DIAGNOSIS — R7309 Other abnormal glucose: Secondary | ICD-10-CM | POA: Diagnosis not present

## 2011-12-03 DIAGNOSIS — R112 Nausea with vomiting, unspecified: Secondary | ICD-10-CM | POA: Diagnosis not present

## 2011-12-05 DIAGNOSIS — K5900 Constipation, unspecified: Secondary | ICD-10-CM | POA: Diagnosis not present

## 2011-12-05 DIAGNOSIS — K859 Acute pancreatitis without necrosis or infection, unspecified: Secondary | ICD-10-CM | POA: Diagnosis not present

## 2011-12-05 DIAGNOSIS — R7309 Other abnormal glucose: Secondary | ICD-10-CM | POA: Diagnosis not present

## 2011-12-05 DIAGNOSIS — Z794 Long term (current) use of insulin: Secondary | ICD-10-CM | POA: Diagnosis not present

## 2011-12-05 DIAGNOSIS — R112 Nausea with vomiting, unspecified: Secondary | ICD-10-CM | POA: Diagnosis not present

## 2011-12-07 DIAGNOSIS — Z794 Long term (current) use of insulin: Secondary | ICD-10-CM | POA: Diagnosis not present

## 2011-12-07 DIAGNOSIS — R7309 Other abnormal glucose: Secondary | ICD-10-CM | POA: Diagnosis not present

## 2011-12-07 DIAGNOSIS — R112 Nausea with vomiting, unspecified: Secondary | ICD-10-CM | POA: Diagnosis not present

## 2011-12-07 DIAGNOSIS — K859 Acute pancreatitis without necrosis or infection, unspecified: Secondary | ICD-10-CM | POA: Diagnosis not present

## 2011-12-07 DIAGNOSIS — K5900 Constipation, unspecified: Secondary | ICD-10-CM | POA: Diagnosis not present

## 2011-12-10 DIAGNOSIS — K5900 Constipation, unspecified: Secondary | ICD-10-CM | POA: Diagnosis not present

## 2011-12-10 DIAGNOSIS — R7309 Other abnormal glucose: Secondary | ICD-10-CM | POA: Diagnosis not present

## 2011-12-10 DIAGNOSIS — Z794 Long term (current) use of insulin: Secondary | ICD-10-CM | POA: Diagnosis not present

## 2011-12-10 DIAGNOSIS — K859 Acute pancreatitis without necrosis or infection, unspecified: Secondary | ICD-10-CM | POA: Diagnosis not present

## 2011-12-10 DIAGNOSIS — R112 Nausea with vomiting, unspecified: Secondary | ICD-10-CM | POA: Diagnosis not present

## 2011-12-12 DIAGNOSIS — R112 Nausea with vomiting, unspecified: Secondary | ICD-10-CM | POA: Diagnosis not present

## 2011-12-12 DIAGNOSIS — K5900 Constipation, unspecified: Secondary | ICD-10-CM | POA: Diagnosis not present

## 2011-12-12 DIAGNOSIS — Z794 Long term (current) use of insulin: Secondary | ICD-10-CM | POA: Diagnosis not present

## 2011-12-12 DIAGNOSIS — R7309 Other abnormal glucose: Secondary | ICD-10-CM | POA: Diagnosis not present

## 2011-12-12 DIAGNOSIS — K859 Acute pancreatitis without necrosis or infection, unspecified: Secondary | ICD-10-CM | POA: Diagnosis not present

## 2011-12-13 DIAGNOSIS — K859 Acute pancreatitis without necrosis or infection, unspecified: Secondary | ICD-10-CM | POA: Diagnosis not present

## 2011-12-13 DIAGNOSIS — R112 Nausea with vomiting, unspecified: Secondary | ICD-10-CM | POA: Diagnosis not present

## 2011-12-13 DIAGNOSIS — R7309 Other abnormal glucose: Secondary | ICD-10-CM | POA: Diagnosis not present

## 2011-12-13 DIAGNOSIS — Z794 Long term (current) use of insulin: Secondary | ICD-10-CM | POA: Diagnosis not present

## 2011-12-13 DIAGNOSIS — K5900 Constipation, unspecified: Secondary | ICD-10-CM | POA: Diagnosis not present

## 2011-12-14 DIAGNOSIS — R112 Nausea with vomiting, unspecified: Secondary | ICD-10-CM | POA: Diagnosis not present

## 2011-12-14 DIAGNOSIS — K859 Acute pancreatitis without necrosis or infection, unspecified: Secondary | ICD-10-CM | POA: Diagnosis not present

## 2011-12-14 DIAGNOSIS — Z794 Long term (current) use of insulin: Secondary | ICD-10-CM | POA: Diagnosis not present

## 2011-12-14 DIAGNOSIS — K5900 Constipation, unspecified: Secondary | ICD-10-CM | POA: Diagnosis not present

## 2011-12-14 DIAGNOSIS — R7309 Other abnormal glucose: Secondary | ICD-10-CM | POA: Diagnosis not present

## 2011-12-17 DIAGNOSIS — R7309 Other abnormal glucose: Secondary | ICD-10-CM | POA: Diagnosis not present

## 2011-12-17 DIAGNOSIS — Z794 Long term (current) use of insulin: Secondary | ICD-10-CM | POA: Diagnosis not present

## 2011-12-17 DIAGNOSIS — K5900 Constipation, unspecified: Secondary | ICD-10-CM | POA: Diagnosis not present

## 2011-12-17 DIAGNOSIS — R112 Nausea with vomiting, unspecified: Secondary | ICD-10-CM | POA: Diagnosis not present

## 2011-12-17 DIAGNOSIS — K859 Acute pancreatitis without necrosis or infection, unspecified: Secondary | ICD-10-CM | POA: Diagnosis not present

## 2011-12-19 DIAGNOSIS — K859 Acute pancreatitis without necrosis or infection, unspecified: Secondary | ICD-10-CM | POA: Diagnosis not present

## 2011-12-19 DIAGNOSIS — K5900 Constipation, unspecified: Secondary | ICD-10-CM | POA: Diagnosis not present

## 2011-12-19 DIAGNOSIS — R112 Nausea with vomiting, unspecified: Secondary | ICD-10-CM | POA: Diagnosis not present

## 2011-12-19 DIAGNOSIS — Z794 Long term (current) use of insulin: Secondary | ICD-10-CM | POA: Diagnosis not present

## 2011-12-19 DIAGNOSIS — R7309 Other abnormal glucose: Secondary | ICD-10-CM | POA: Diagnosis not present

## 2011-12-20 DIAGNOSIS — K861 Other chronic pancreatitis: Secondary | ICD-10-CM | POA: Diagnosis not present

## 2011-12-20 DIAGNOSIS — R7989 Other specified abnormal findings of blood chemistry: Secondary | ICD-10-CM | POA: Diagnosis not present

## 2011-12-21 DIAGNOSIS — K5900 Constipation, unspecified: Secondary | ICD-10-CM | POA: Diagnosis not present

## 2011-12-21 DIAGNOSIS — K859 Acute pancreatitis without necrosis or infection, unspecified: Secondary | ICD-10-CM | POA: Diagnosis not present

## 2011-12-21 DIAGNOSIS — Z794 Long term (current) use of insulin: Secondary | ICD-10-CM | POA: Diagnosis not present

## 2011-12-21 DIAGNOSIS — R112 Nausea with vomiting, unspecified: Secondary | ICD-10-CM | POA: Diagnosis not present

## 2011-12-21 DIAGNOSIS — R7309 Other abnormal glucose: Secondary | ICD-10-CM | POA: Diagnosis not present

## 2011-12-24 DIAGNOSIS — Z794 Long term (current) use of insulin: Secondary | ICD-10-CM | POA: Diagnosis not present

## 2011-12-24 DIAGNOSIS — R112 Nausea with vomiting, unspecified: Secondary | ICD-10-CM | POA: Diagnosis not present

## 2011-12-24 DIAGNOSIS — K5900 Constipation, unspecified: Secondary | ICD-10-CM | POA: Diagnosis not present

## 2011-12-24 DIAGNOSIS — R7309 Other abnormal glucose: Secondary | ICD-10-CM | POA: Diagnosis not present

## 2011-12-24 DIAGNOSIS — K859 Acute pancreatitis without necrosis or infection, unspecified: Secondary | ICD-10-CM | POA: Diagnosis not present

## 2011-12-25 DIAGNOSIS — K861 Other chronic pancreatitis: Secondary | ICD-10-CM | POA: Diagnosis not present

## 2011-12-26 DIAGNOSIS — K859 Acute pancreatitis without necrosis or infection, unspecified: Secondary | ICD-10-CM | POA: Diagnosis not present

## 2011-12-26 DIAGNOSIS — R7309 Other abnormal glucose: Secondary | ICD-10-CM | POA: Diagnosis not present

## 2011-12-26 DIAGNOSIS — R112 Nausea with vomiting, unspecified: Secondary | ICD-10-CM | POA: Diagnosis not present

## 2011-12-26 DIAGNOSIS — K5900 Constipation, unspecified: Secondary | ICD-10-CM | POA: Diagnosis not present

## 2011-12-26 DIAGNOSIS — Z794 Long term (current) use of insulin: Secondary | ICD-10-CM | POA: Diagnosis not present

## 2011-12-27 DIAGNOSIS — S336XXA Sprain of sacroiliac joint, initial encounter: Secondary | ICD-10-CM | POA: Diagnosis not present

## 2011-12-27 DIAGNOSIS — E119 Type 2 diabetes mellitus without complications: Secondary | ICD-10-CM | POA: Diagnosis not present

## 2011-12-27 DIAGNOSIS — M545 Low back pain: Secondary | ICD-10-CM | POA: Diagnosis not present

## 2011-12-28 DIAGNOSIS — R112 Nausea with vomiting, unspecified: Secondary | ICD-10-CM | POA: Diagnosis not present

## 2011-12-28 DIAGNOSIS — K5900 Constipation, unspecified: Secondary | ICD-10-CM | POA: Diagnosis not present

## 2011-12-28 DIAGNOSIS — Z794 Long term (current) use of insulin: Secondary | ICD-10-CM | POA: Diagnosis not present

## 2011-12-28 DIAGNOSIS — R7309 Other abnormal glucose: Secondary | ICD-10-CM | POA: Diagnosis not present

## 2011-12-28 DIAGNOSIS — K859 Acute pancreatitis without necrosis or infection, unspecified: Secondary | ICD-10-CM | POA: Diagnosis not present

## 2011-12-31 DIAGNOSIS — K5900 Constipation, unspecified: Secondary | ICD-10-CM | POA: Diagnosis not present

## 2011-12-31 DIAGNOSIS — R7309 Other abnormal glucose: Secondary | ICD-10-CM | POA: Diagnosis not present

## 2011-12-31 DIAGNOSIS — R112 Nausea with vomiting, unspecified: Secondary | ICD-10-CM | POA: Diagnosis not present

## 2011-12-31 DIAGNOSIS — K859 Acute pancreatitis without necrosis or infection, unspecified: Secondary | ICD-10-CM | POA: Diagnosis not present

## 2011-12-31 DIAGNOSIS — Z794 Long term (current) use of insulin: Secondary | ICD-10-CM | POA: Diagnosis not present

## 2012-01-02 DIAGNOSIS — Z794 Long term (current) use of insulin: Secondary | ICD-10-CM | POA: Diagnosis not present

## 2012-01-02 DIAGNOSIS — R7309 Other abnormal glucose: Secondary | ICD-10-CM | POA: Diagnosis not present

## 2012-01-02 DIAGNOSIS — K859 Acute pancreatitis without necrosis or infection, unspecified: Secondary | ICD-10-CM | POA: Diagnosis not present

## 2012-01-02 DIAGNOSIS — K5900 Constipation, unspecified: Secondary | ICD-10-CM | POA: Diagnosis not present

## 2012-01-02 DIAGNOSIS — R112 Nausea with vomiting, unspecified: Secondary | ICD-10-CM | POA: Diagnosis not present

## 2012-01-03 DIAGNOSIS — K861 Other chronic pancreatitis: Secondary | ICD-10-CM | POA: Diagnosis not present

## 2012-01-03 DIAGNOSIS — L03319 Cellulitis of trunk, unspecified: Secondary | ICD-10-CM | POA: Diagnosis not present

## 2012-01-03 DIAGNOSIS — T8579XA Infection and inflammatory reaction due to other internal prosthetic devices, implants and grafts, initial encounter: Secondary | ICD-10-CM | POA: Diagnosis present

## 2012-01-03 DIAGNOSIS — L0390 Cellulitis, unspecified: Secondary | ICD-10-CM | POA: Diagnosis present

## 2012-01-03 DIAGNOSIS — L02219 Cutaneous abscess of trunk, unspecified: Secondary | ICD-10-CM | POA: Diagnosis not present

## 2012-01-03 DIAGNOSIS — L0291 Cutaneous abscess, unspecified: Secondary | ICD-10-CM | POA: Diagnosis present

## 2012-01-03 DIAGNOSIS — K219 Gastro-esophageal reflux disease without esophagitis: Secondary | ICD-10-CM | POA: Diagnosis present

## 2012-01-07 DIAGNOSIS — K859 Acute pancreatitis without necrosis or infection, unspecified: Secondary | ICD-10-CM | POA: Diagnosis not present

## 2012-01-07 DIAGNOSIS — R7309 Other abnormal glucose: Secondary | ICD-10-CM | POA: Diagnosis not present

## 2012-01-07 DIAGNOSIS — K5900 Constipation, unspecified: Secondary | ICD-10-CM | POA: Diagnosis not present

## 2012-01-07 DIAGNOSIS — Z794 Long term (current) use of insulin: Secondary | ICD-10-CM | POA: Diagnosis not present

## 2012-01-07 DIAGNOSIS — R112 Nausea with vomiting, unspecified: Secondary | ICD-10-CM | POA: Diagnosis not present

## 2012-01-09 DIAGNOSIS — K859 Acute pancreatitis without necrosis or infection, unspecified: Secondary | ICD-10-CM | POA: Diagnosis not present

## 2012-01-09 DIAGNOSIS — R112 Nausea with vomiting, unspecified: Secondary | ICD-10-CM | POA: Diagnosis not present

## 2012-01-09 DIAGNOSIS — R7309 Other abnormal glucose: Secondary | ICD-10-CM | POA: Diagnosis not present

## 2012-01-09 DIAGNOSIS — Z794 Long term (current) use of insulin: Secondary | ICD-10-CM | POA: Diagnosis not present

## 2012-01-09 DIAGNOSIS — K5900 Constipation, unspecified: Secondary | ICD-10-CM | POA: Diagnosis not present

## 2012-01-10 DIAGNOSIS — L03319 Cellulitis of trunk, unspecified: Secondary | ICD-10-CM | POA: Diagnosis not present

## 2012-01-10 DIAGNOSIS — K861 Other chronic pancreatitis: Secondary | ICD-10-CM | POA: Diagnosis not present

## 2012-01-10 DIAGNOSIS — L02219 Cutaneous abscess of trunk, unspecified: Secondary | ICD-10-CM | POA: Diagnosis not present

## 2012-01-11 DIAGNOSIS — R112 Nausea with vomiting, unspecified: Secondary | ICD-10-CM | POA: Diagnosis not present

## 2012-01-11 DIAGNOSIS — K5900 Constipation, unspecified: Secondary | ICD-10-CM | POA: Diagnosis not present

## 2012-01-11 DIAGNOSIS — Z794 Long term (current) use of insulin: Secondary | ICD-10-CM | POA: Diagnosis not present

## 2012-01-11 DIAGNOSIS — R7309 Other abnormal glucose: Secondary | ICD-10-CM | POA: Diagnosis not present

## 2012-01-11 DIAGNOSIS — K859 Acute pancreatitis without necrosis or infection, unspecified: Secondary | ICD-10-CM | POA: Diagnosis not present

## 2012-01-14 DIAGNOSIS — K5900 Constipation, unspecified: Secondary | ICD-10-CM | POA: Diagnosis not present

## 2012-01-14 DIAGNOSIS — K859 Acute pancreatitis without necrosis or infection, unspecified: Secondary | ICD-10-CM | POA: Diagnosis not present

## 2012-01-14 DIAGNOSIS — R112 Nausea with vomiting, unspecified: Secondary | ICD-10-CM | POA: Diagnosis not present

## 2012-01-14 DIAGNOSIS — R7309 Other abnormal glucose: Secondary | ICD-10-CM | POA: Diagnosis not present

## 2012-01-14 DIAGNOSIS — Z794 Long term (current) use of insulin: Secondary | ICD-10-CM | POA: Diagnosis not present

## 2012-01-16 DIAGNOSIS — K859 Acute pancreatitis without necrosis or infection, unspecified: Secondary | ICD-10-CM | POA: Diagnosis not present

## 2012-01-16 DIAGNOSIS — K5900 Constipation, unspecified: Secondary | ICD-10-CM | POA: Diagnosis not present

## 2012-01-16 DIAGNOSIS — Z794 Long term (current) use of insulin: Secondary | ICD-10-CM | POA: Diagnosis not present

## 2012-01-16 DIAGNOSIS — R7309 Other abnormal glucose: Secondary | ICD-10-CM | POA: Diagnosis not present

## 2012-01-16 DIAGNOSIS — R112 Nausea with vomiting, unspecified: Secondary | ICD-10-CM | POA: Diagnosis not present

## 2012-01-17 DIAGNOSIS — K861 Other chronic pancreatitis: Secondary | ICD-10-CM | POA: Diagnosis not present

## 2012-01-17 DIAGNOSIS — L02219 Cutaneous abscess of trunk, unspecified: Secondary | ICD-10-CM | POA: Diagnosis not present

## 2012-01-17 DIAGNOSIS — L03319 Cellulitis of trunk, unspecified: Secondary | ICD-10-CM | POA: Diagnosis not present

## 2012-01-18 DIAGNOSIS — K5900 Constipation, unspecified: Secondary | ICD-10-CM | POA: Diagnosis not present

## 2012-01-18 DIAGNOSIS — R7309 Other abnormal glucose: Secondary | ICD-10-CM | POA: Diagnosis not present

## 2012-01-18 DIAGNOSIS — Z794 Long term (current) use of insulin: Secondary | ICD-10-CM | POA: Diagnosis not present

## 2012-01-18 DIAGNOSIS — K859 Acute pancreatitis without necrosis or infection, unspecified: Secondary | ICD-10-CM | POA: Diagnosis not present

## 2012-01-18 DIAGNOSIS — R112 Nausea with vomiting, unspecified: Secondary | ICD-10-CM | POA: Diagnosis not present

## 2012-01-21 DIAGNOSIS — K5900 Constipation, unspecified: Secondary | ICD-10-CM | POA: Diagnosis not present

## 2012-01-21 DIAGNOSIS — R7309 Other abnormal glucose: Secondary | ICD-10-CM | POA: Diagnosis not present

## 2012-01-21 DIAGNOSIS — Z794 Long term (current) use of insulin: Secondary | ICD-10-CM | POA: Diagnosis not present

## 2012-01-21 DIAGNOSIS — K859 Acute pancreatitis without necrosis or infection, unspecified: Secondary | ICD-10-CM | POA: Diagnosis not present

## 2012-01-21 DIAGNOSIS — R112 Nausea with vomiting, unspecified: Secondary | ICD-10-CM | POA: Diagnosis not present

## 2012-01-23 DIAGNOSIS — R112 Nausea with vomiting, unspecified: Secondary | ICD-10-CM | POA: Diagnosis not present

## 2012-01-23 DIAGNOSIS — K5900 Constipation, unspecified: Secondary | ICD-10-CM | POA: Diagnosis not present

## 2012-01-23 DIAGNOSIS — K859 Acute pancreatitis without necrosis or infection, unspecified: Secondary | ICD-10-CM | POA: Diagnosis not present

## 2012-01-23 DIAGNOSIS — R7309 Other abnormal glucose: Secondary | ICD-10-CM | POA: Diagnosis not present

## 2012-01-23 DIAGNOSIS — Z794 Long term (current) use of insulin: Secondary | ICD-10-CM | POA: Diagnosis not present

## 2012-01-24 DIAGNOSIS — K861 Other chronic pancreatitis: Secondary | ICD-10-CM | POA: Diagnosis not present

## 2012-01-24 DIAGNOSIS — L02219 Cutaneous abscess of trunk, unspecified: Secondary | ICD-10-CM | POA: Diagnosis not present

## 2012-01-24 DIAGNOSIS — K632 Fistula of intestine: Secondary | ICD-10-CM | POA: Diagnosis not present

## 2012-01-25 DIAGNOSIS — K859 Acute pancreatitis without necrosis or infection, unspecified: Secondary | ICD-10-CM | POA: Diagnosis not present

## 2012-01-25 DIAGNOSIS — R112 Nausea with vomiting, unspecified: Secondary | ICD-10-CM | POA: Diagnosis not present

## 2012-01-25 DIAGNOSIS — Z794 Long term (current) use of insulin: Secondary | ICD-10-CM | POA: Diagnosis not present

## 2012-01-25 DIAGNOSIS — K5900 Constipation, unspecified: Secondary | ICD-10-CM | POA: Diagnosis not present

## 2012-01-25 DIAGNOSIS — R7309 Other abnormal glucose: Secondary | ICD-10-CM | POA: Diagnosis not present

## 2012-01-28 DIAGNOSIS — K859 Acute pancreatitis without necrosis or infection, unspecified: Secondary | ICD-10-CM | POA: Diagnosis not present

## 2012-01-28 DIAGNOSIS — R7309 Other abnormal glucose: Secondary | ICD-10-CM | POA: Diagnosis not present

## 2012-01-28 DIAGNOSIS — K5900 Constipation, unspecified: Secondary | ICD-10-CM | POA: Diagnosis not present

## 2012-01-28 DIAGNOSIS — R112 Nausea with vomiting, unspecified: Secondary | ICD-10-CM | POA: Diagnosis not present

## 2012-01-28 DIAGNOSIS — Z794 Long term (current) use of insulin: Secondary | ICD-10-CM | POA: Diagnosis not present

## 2012-01-30 DIAGNOSIS — K859 Acute pancreatitis without necrosis or infection, unspecified: Secondary | ICD-10-CM | POA: Diagnosis not present

## 2012-01-30 DIAGNOSIS — R112 Nausea with vomiting, unspecified: Secondary | ICD-10-CM | POA: Diagnosis not present

## 2012-01-30 DIAGNOSIS — K5900 Constipation, unspecified: Secondary | ICD-10-CM | POA: Diagnosis not present

## 2012-01-30 DIAGNOSIS — R7309 Other abnormal glucose: Secondary | ICD-10-CM | POA: Diagnosis not present

## 2012-01-30 DIAGNOSIS — Z794 Long term (current) use of insulin: Secondary | ICD-10-CM | POA: Diagnosis not present

## 2012-01-31 DIAGNOSIS — L02219 Cutaneous abscess of trunk, unspecified: Secondary | ICD-10-CM | POA: Diagnosis not present

## 2012-01-31 DIAGNOSIS — K861 Other chronic pancreatitis: Secondary | ICD-10-CM | POA: Diagnosis not present

## 2012-02-01 DIAGNOSIS — K5900 Constipation, unspecified: Secondary | ICD-10-CM | POA: Diagnosis not present

## 2012-02-01 DIAGNOSIS — K859 Acute pancreatitis without necrosis or infection, unspecified: Secondary | ICD-10-CM | POA: Diagnosis not present

## 2012-02-01 DIAGNOSIS — Z794 Long term (current) use of insulin: Secondary | ICD-10-CM | POA: Diagnosis not present

## 2012-02-01 DIAGNOSIS — R112 Nausea with vomiting, unspecified: Secondary | ICD-10-CM | POA: Diagnosis not present

## 2012-02-01 DIAGNOSIS — R7309 Other abnormal glucose: Secondary | ICD-10-CM | POA: Diagnosis not present

## 2012-02-04 DIAGNOSIS — K5900 Constipation, unspecified: Secondary | ICD-10-CM | POA: Diagnosis not present

## 2012-02-04 DIAGNOSIS — R7309 Other abnormal glucose: Secondary | ICD-10-CM | POA: Diagnosis not present

## 2012-02-04 DIAGNOSIS — R112 Nausea with vomiting, unspecified: Secondary | ICD-10-CM | POA: Diagnosis not present

## 2012-02-04 DIAGNOSIS — K859 Acute pancreatitis without necrosis or infection, unspecified: Secondary | ICD-10-CM | POA: Diagnosis not present

## 2012-02-04 DIAGNOSIS — Z794 Long term (current) use of insulin: Secondary | ICD-10-CM | POA: Diagnosis not present

## 2012-02-06 DIAGNOSIS — R7309 Other abnormal glucose: Secondary | ICD-10-CM | POA: Diagnosis not present

## 2012-02-06 DIAGNOSIS — K859 Acute pancreatitis without necrosis or infection, unspecified: Secondary | ICD-10-CM | POA: Diagnosis not present

## 2012-02-06 DIAGNOSIS — Z794 Long term (current) use of insulin: Secondary | ICD-10-CM | POA: Diagnosis not present

## 2012-02-06 DIAGNOSIS — K5900 Constipation, unspecified: Secondary | ICD-10-CM | POA: Diagnosis not present

## 2012-02-06 DIAGNOSIS — R112 Nausea with vomiting, unspecified: Secondary | ICD-10-CM | POA: Diagnosis not present

## 2012-02-08 DIAGNOSIS — R7309 Other abnormal glucose: Secondary | ICD-10-CM | POA: Diagnosis not present

## 2012-02-08 DIAGNOSIS — K5900 Constipation, unspecified: Secondary | ICD-10-CM | POA: Diagnosis not present

## 2012-02-08 DIAGNOSIS — K859 Acute pancreatitis without necrosis or infection, unspecified: Secondary | ICD-10-CM | POA: Diagnosis not present

## 2012-02-08 DIAGNOSIS — Z794 Long term (current) use of insulin: Secondary | ICD-10-CM | POA: Diagnosis not present

## 2012-02-08 DIAGNOSIS — R112 Nausea with vomiting, unspecified: Secondary | ICD-10-CM | POA: Diagnosis not present

## 2012-02-11 DIAGNOSIS — Z794 Long term (current) use of insulin: Secondary | ICD-10-CM | POA: Diagnosis not present

## 2012-02-11 DIAGNOSIS — K5900 Constipation, unspecified: Secondary | ICD-10-CM | POA: Diagnosis not present

## 2012-02-11 DIAGNOSIS — R112 Nausea with vomiting, unspecified: Secondary | ICD-10-CM | POA: Diagnosis not present

## 2012-02-11 DIAGNOSIS — R7309 Other abnormal glucose: Secondary | ICD-10-CM | POA: Diagnosis not present

## 2012-02-11 DIAGNOSIS — K859 Acute pancreatitis without necrosis or infection, unspecified: Secondary | ICD-10-CM | POA: Diagnosis not present

## 2012-02-12 DIAGNOSIS — L02219 Cutaneous abscess of trunk, unspecified: Secondary | ICD-10-CM | POA: Diagnosis not present

## 2012-02-12 DIAGNOSIS — R112 Nausea with vomiting, unspecified: Secondary | ICD-10-CM | POA: Diagnosis not present

## 2012-02-12 DIAGNOSIS — L8995 Pressure ulcer of unspecified site, unstageable: Secondary | ICD-10-CM | POA: Diagnosis not present

## 2012-02-12 DIAGNOSIS — R627 Adult failure to thrive: Secondary | ICD-10-CM | POA: Diagnosis not present

## 2012-02-12 DIAGNOSIS — R7309 Other abnormal glucose: Secondary | ICD-10-CM | POA: Diagnosis not present

## 2012-02-13 DIAGNOSIS — L02219 Cutaneous abscess of trunk, unspecified: Secondary | ICD-10-CM | POA: Diagnosis not present

## 2012-02-13 DIAGNOSIS — R627 Adult failure to thrive: Secondary | ICD-10-CM | POA: Diagnosis not present

## 2012-02-13 DIAGNOSIS — R112 Nausea with vomiting, unspecified: Secondary | ICD-10-CM | POA: Diagnosis not present

## 2012-02-13 DIAGNOSIS — R7309 Other abnormal glucose: Secondary | ICD-10-CM | POA: Diagnosis not present

## 2012-02-13 DIAGNOSIS — L8995 Pressure ulcer of unspecified site, unstageable: Secondary | ICD-10-CM | POA: Diagnosis not present

## 2012-02-14 DIAGNOSIS — L02219 Cutaneous abscess of trunk, unspecified: Secondary | ICD-10-CM | POA: Diagnosis not present

## 2012-02-14 DIAGNOSIS — K861 Other chronic pancreatitis: Secondary | ICD-10-CM | POA: Diagnosis not present

## 2012-02-14 DIAGNOSIS — L03319 Cellulitis of trunk, unspecified: Secondary | ICD-10-CM | POA: Diagnosis not present

## 2012-02-14 DIAGNOSIS — K869 Disease of pancreas, unspecified: Secondary | ICD-10-CM | POA: Diagnosis not present

## 2012-02-15 DIAGNOSIS — R7309 Other abnormal glucose: Secondary | ICD-10-CM | POA: Diagnosis not present

## 2012-02-15 DIAGNOSIS — L03319 Cellulitis of trunk, unspecified: Secondary | ICD-10-CM | POA: Diagnosis not present

## 2012-02-15 DIAGNOSIS — R112 Nausea with vomiting, unspecified: Secondary | ICD-10-CM | POA: Diagnosis not present

## 2012-02-15 DIAGNOSIS — R627 Adult failure to thrive: Secondary | ICD-10-CM | POA: Diagnosis not present

## 2012-02-15 DIAGNOSIS — L8995 Pressure ulcer of unspecified site, unstageable: Secondary | ICD-10-CM | POA: Diagnosis not present

## 2012-02-18 DIAGNOSIS — R7309 Other abnormal glucose: Secondary | ICD-10-CM | POA: Diagnosis not present

## 2012-02-18 DIAGNOSIS — L03319 Cellulitis of trunk, unspecified: Secondary | ICD-10-CM | POA: Diagnosis not present

## 2012-02-18 DIAGNOSIS — L8995 Pressure ulcer of unspecified site, unstageable: Secondary | ICD-10-CM | POA: Diagnosis not present

## 2012-02-18 DIAGNOSIS — R112 Nausea with vomiting, unspecified: Secondary | ICD-10-CM | POA: Diagnosis not present

## 2012-02-18 DIAGNOSIS — R627 Adult failure to thrive: Secondary | ICD-10-CM | POA: Diagnosis not present

## 2012-02-20 DIAGNOSIS — L03319 Cellulitis of trunk, unspecified: Secondary | ICD-10-CM | POA: Diagnosis not present

## 2012-02-20 DIAGNOSIS — R627 Adult failure to thrive: Secondary | ICD-10-CM | POA: Diagnosis not present

## 2012-02-20 DIAGNOSIS — R7309 Other abnormal glucose: Secondary | ICD-10-CM | POA: Diagnosis not present

## 2012-02-20 DIAGNOSIS — L8995 Pressure ulcer of unspecified site, unstageable: Secondary | ICD-10-CM | POA: Diagnosis not present

## 2012-02-20 DIAGNOSIS — R112 Nausea with vomiting, unspecified: Secondary | ICD-10-CM | POA: Diagnosis not present

## 2012-02-22 DIAGNOSIS — L03319 Cellulitis of trunk, unspecified: Secondary | ICD-10-CM | POA: Diagnosis not present

## 2012-02-22 DIAGNOSIS — L8995 Pressure ulcer of unspecified site, unstageable: Secondary | ICD-10-CM | POA: Diagnosis not present

## 2012-02-22 DIAGNOSIS — R112 Nausea with vomiting, unspecified: Secondary | ICD-10-CM | POA: Diagnosis not present

## 2012-02-22 DIAGNOSIS — K859 Acute pancreatitis without necrosis or infection, unspecified: Secondary | ICD-10-CM | POA: Diagnosis not present

## 2012-02-22 DIAGNOSIS — R7309 Other abnormal glucose: Secondary | ICD-10-CM | POA: Diagnosis not present

## 2012-02-22 DIAGNOSIS — R627 Adult failure to thrive: Secondary | ICD-10-CM | POA: Diagnosis not present

## 2012-02-25 DIAGNOSIS — R627 Adult failure to thrive: Secondary | ICD-10-CM | POA: Diagnosis not present

## 2012-02-25 DIAGNOSIS — E119 Type 2 diabetes mellitus without complications: Secondary | ICD-10-CM | POA: Diagnosis not present

## 2012-02-25 DIAGNOSIS — L8995 Pressure ulcer of unspecified site, unstageable: Secondary | ICD-10-CM | POA: Diagnosis not present

## 2012-02-25 DIAGNOSIS — R112 Nausea with vomiting, unspecified: Secondary | ICD-10-CM | POA: Diagnosis not present

## 2012-02-25 DIAGNOSIS — G4700 Insomnia, unspecified: Secondary | ICD-10-CM | POA: Diagnosis not present

## 2012-02-25 DIAGNOSIS — L02219 Cutaneous abscess of trunk, unspecified: Secondary | ICD-10-CM | POA: Diagnosis not present

## 2012-02-25 DIAGNOSIS — R7309 Other abnormal glucose: Secondary | ICD-10-CM | POA: Diagnosis not present

## 2012-02-27 DIAGNOSIS — L02219 Cutaneous abscess of trunk, unspecified: Secondary | ICD-10-CM | POA: Diagnosis not present

## 2012-02-27 DIAGNOSIS — R7309 Other abnormal glucose: Secondary | ICD-10-CM | POA: Diagnosis not present

## 2012-02-27 DIAGNOSIS — R112 Nausea with vomiting, unspecified: Secondary | ICD-10-CM | POA: Diagnosis not present

## 2012-02-27 DIAGNOSIS — R627 Adult failure to thrive: Secondary | ICD-10-CM | POA: Diagnosis not present

## 2012-02-27 DIAGNOSIS — L8995 Pressure ulcer of unspecified site, unstageable: Secondary | ICD-10-CM | POA: Diagnosis not present

## 2012-02-28 DIAGNOSIS — L03319 Cellulitis of trunk, unspecified: Secondary | ICD-10-CM | POA: Diagnosis not present

## 2012-02-28 DIAGNOSIS — K632 Fistula of intestine: Secondary | ICD-10-CM | POA: Diagnosis not present

## 2012-02-28 DIAGNOSIS — L02219 Cutaneous abscess of trunk, unspecified: Secondary | ICD-10-CM | POA: Diagnosis not present

## 2012-02-29 DIAGNOSIS — R112 Nausea with vomiting, unspecified: Secondary | ICD-10-CM | POA: Diagnosis not present

## 2012-02-29 DIAGNOSIS — R627 Adult failure to thrive: Secondary | ICD-10-CM | POA: Diagnosis not present

## 2012-02-29 DIAGNOSIS — L03319 Cellulitis of trunk, unspecified: Secondary | ICD-10-CM | POA: Diagnosis not present

## 2012-02-29 DIAGNOSIS — L8995 Pressure ulcer of unspecified site, unstageable: Secondary | ICD-10-CM | POA: Diagnosis not present

## 2012-02-29 DIAGNOSIS — R7309 Other abnormal glucose: Secondary | ICD-10-CM | POA: Diagnosis not present

## 2012-03-03 DIAGNOSIS — L8995 Pressure ulcer of unspecified site, unstageable: Secondary | ICD-10-CM | POA: Diagnosis not present

## 2012-03-03 DIAGNOSIS — R7309 Other abnormal glucose: Secondary | ICD-10-CM | POA: Diagnosis not present

## 2012-03-03 DIAGNOSIS — L03319 Cellulitis of trunk, unspecified: Secondary | ICD-10-CM | POA: Diagnosis not present

## 2012-03-03 DIAGNOSIS — R627 Adult failure to thrive: Secondary | ICD-10-CM | POA: Diagnosis not present

## 2012-03-03 DIAGNOSIS — R112 Nausea with vomiting, unspecified: Secondary | ICD-10-CM | POA: Diagnosis not present

## 2012-03-06 DIAGNOSIS — L8995 Pressure ulcer of unspecified site, unstageable: Secondary | ICD-10-CM | POA: Diagnosis not present

## 2012-03-06 DIAGNOSIS — R112 Nausea with vomiting, unspecified: Secondary | ICD-10-CM | POA: Diagnosis not present

## 2012-03-06 DIAGNOSIS — L02219 Cutaneous abscess of trunk, unspecified: Secondary | ICD-10-CM | POA: Diagnosis not present

## 2012-03-06 DIAGNOSIS — R7309 Other abnormal glucose: Secondary | ICD-10-CM | POA: Diagnosis not present

## 2012-03-06 DIAGNOSIS — R627 Adult failure to thrive: Secondary | ICD-10-CM | POA: Diagnosis not present

## 2012-03-10 DIAGNOSIS — L8995 Pressure ulcer of unspecified site, unstageable: Secondary | ICD-10-CM | POA: Diagnosis not present

## 2012-03-10 DIAGNOSIS — L02219 Cutaneous abscess of trunk, unspecified: Secondary | ICD-10-CM | POA: Diagnosis not present

## 2012-03-10 DIAGNOSIS — R112 Nausea with vomiting, unspecified: Secondary | ICD-10-CM | POA: Diagnosis not present

## 2012-03-10 DIAGNOSIS — R7309 Other abnormal glucose: Secondary | ICD-10-CM | POA: Diagnosis not present

## 2012-03-10 DIAGNOSIS — R627 Adult failure to thrive: Secondary | ICD-10-CM | POA: Diagnosis not present

## 2012-03-13 DIAGNOSIS — K869 Disease of pancreas, unspecified: Secondary | ICD-10-CM | POA: Diagnosis not present

## 2012-03-13 DIAGNOSIS — L02219 Cutaneous abscess of trunk, unspecified: Secondary | ICD-10-CM | POA: Diagnosis not present

## 2012-03-13 DIAGNOSIS — K632 Fistula of intestine: Secondary | ICD-10-CM | POA: Diagnosis not present

## 2012-03-13 DIAGNOSIS — L03319 Cellulitis of trunk, unspecified: Secondary | ICD-10-CM | POA: Diagnosis not present

## 2012-03-13 DIAGNOSIS — Z01818 Encounter for other preprocedural examination: Secondary | ICD-10-CM | POA: Diagnosis not present

## 2012-03-14 DIAGNOSIS — R627 Adult failure to thrive: Secondary | ICD-10-CM | POA: Diagnosis not present

## 2012-03-14 DIAGNOSIS — R7309 Other abnormal glucose: Secondary | ICD-10-CM | POA: Diagnosis not present

## 2012-03-14 DIAGNOSIS — L8995 Pressure ulcer of unspecified site, unstageable: Secondary | ICD-10-CM | POA: Diagnosis not present

## 2012-03-14 DIAGNOSIS — R112 Nausea with vomiting, unspecified: Secondary | ICD-10-CM | POA: Diagnosis not present

## 2012-03-14 DIAGNOSIS — L02219 Cutaneous abscess of trunk, unspecified: Secondary | ICD-10-CM | POA: Diagnosis not present

## 2012-03-17 DIAGNOSIS — E891 Postprocedural hypoinsulinemia: Secondary | ICD-10-CM | POA: Diagnosis not present

## 2012-03-17 DIAGNOSIS — K632 Fistula of intestine: Secondary | ICD-10-CM | POA: Diagnosis not present

## 2012-03-17 DIAGNOSIS — R7989 Other specified abnormal findings of blood chemistry: Secondary | ICD-10-CM | POA: Diagnosis not present

## 2012-03-17 DIAGNOSIS — G8918 Other acute postprocedural pain: Secondary | ICD-10-CM | POA: Diagnosis not present

## 2012-03-17 DIAGNOSIS — Z86711 Personal history of pulmonary embolism: Secondary | ICD-10-CM | POA: Diagnosis not present

## 2012-03-17 DIAGNOSIS — E119 Type 2 diabetes mellitus without complications: Secondary | ICD-10-CM | POA: Diagnosis present

## 2012-03-17 DIAGNOSIS — E875 Hyperkalemia: Secondary | ICD-10-CM | POA: Diagnosis not present

## 2012-03-17 DIAGNOSIS — F319 Bipolar disorder, unspecified: Secondary | ICD-10-CM | POA: Diagnosis present

## 2012-03-17 DIAGNOSIS — T8183XA Persistent postprocedural fistula, initial encounter: Secondary | ICD-10-CM | POA: Diagnosis present

## 2012-03-21 DIAGNOSIS — R112 Nausea with vomiting, unspecified: Secondary | ICD-10-CM | POA: Diagnosis not present

## 2012-03-21 DIAGNOSIS — R627 Adult failure to thrive: Secondary | ICD-10-CM | POA: Diagnosis not present

## 2012-03-21 DIAGNOSIS — R7309 Other abnormal glucose: Secondary | ICD-10-CM | POA: Diagnosis not present

## 2012-03-21 DIAGNOSIS — L8995 Pressure ulcer of unspecified site, unstageable: Secondary | ICD-10-CM | POA: Diagnosis not present

## 2012-03-21 DIAGNOSIS — L03319 Cellulitis of trunk, unspecified: Secondary | ICD-10-CM | POA: Diagnosis not present

## 2012-03-24 DIAGNOSIS — R112 Nausea with vomiting, unspecified: Secondary | ICD-10-CM | POA: Diagnosis not present

## 2012-03-24 DIAGNOSIS — L8995 Pressure ulcer of unspecified site, unstageable: Secondary | ICD-10-CM | POA: Diagnosis not present

## 2012-03-24 DIAGNOSIS — L03319 Cellulitis of trunk, unspecified: Secondary | ICD-10-CM | POA: Diagnosis not present

## 2012-03-24 DIAGNOSIS — R627 Adult failure to thrive: Secondary | ICD-10-CM | POA: Diagnosis not present

## 2012-03-24 DIAGNOSIS — R7309 Other abnormal glucose: Secondary | ICD-10-CM | POA: Diagnosis not present

## 2012-03-26 DIAGNOSIS — R7309 Other abnormal glucose: Secondary | ICD-10-CM | POA: Diagnosis not present

## 2012-03-26 DIAGNOSIS — L03319 Cellulitis of trunk, unspecified: Secondary | ICD-10-CM | POA: Diagnosis not present

## 2012-03-26 DIAGNOSIS — R627 Adult failure to thrive: Secondary | ICD-10-CM | POA: Diagnosis not present

## 2012-03-26 DIAGNOSIS — R112 Nausea with vomiting, unspecified: Secondary | ICD-10-CM | POA: Diagnosis not present

## 2012-03-26 DIAGNOSIS — L8995 Pressure ulcer of unspecified site, unstageable: Secondary | ICD-10-CM | POA: Diagnosis not present

## 2012-03-28 DIAGNOSIS — R627 Adult failure to thrive: Secondary | ICD-10-CM | POA: Diagnosis not present

## 2012-03-28 DIAGNOSIS — L8995 Pressure ulcer of unspecified site, unstageable: Secondary | ICD-10-CM | POA: Diagnosis not present

## 2012-03-28 DIAGNOSIS — R7309 Other abnormal glucose: Secondary | ICD-10-CM | POA: Diagnosis not present

## 2012-03-28 DIAGNOSIS — R112 Nausea with vomiting, unspecified: Secondary | ICD-10-CM | POA: Diagnosis not present

## 2012-03-28 DIAGNOSIS — L03319 Cellulitis of trunk, unspecified: Secondary | ICD-10-CM | POA: Diagnosis not present

## 2012-03-31 DIAGNOSIS — L02219 Cutaneous abscess of trunk, unspecified: Secondary | ICD-10-CM | POA: Diagnosis not present

## 2012-03-31 DIAGNOSIS — R7309 Other abnormal glucose: Secondary | ICD-10-CM | POA: Diagnosis not present

## 2012-03-31 DIAGNOSIS — L8995 Pressure ulcer of unspecified site, unstageable: Secondary | ICD-10-CM | POA: Diagnosis not present

## 2012-03-31 DIAGNOSIS — R627 Adult failure to thrive: Secondary | ICD-10-CM | POA: Diagnosis not present

## 2012-03-31 DIAGNOSIS — R112 Nausea with vomiting, unspecified: Secondary | ICD-10-CM | POA: Diagnosis not present

## 2012-04-02 DIAGNOSIS — R7309 Other abnormal glucose: Secondary | ICD-10-CM | POA: Diagnosis not present

## 2012-04-02 DIAGNOSIS — L8995 Pressure ulcer of unspecified site, unstageable: Secondary | ICD-10-CM | POA: Diagnosis not present

## 2012-04-02 DIAGNOSIS — R627 Adult failure to thrive: Secondary | ICD-10-CM | POA: Diagnosis not present

## 2012-04-02 DIAGNOSIS — R112 Nausea with vomiting, unspecified: Secondary | ICD-10-CM | POA: Diagnosis not present

## 2012-04-02 DIAGNOSIS — L03319 Cellulitis of trunk, unspecified: Secondary | ICD-10-CM | POA: Diagnosis not present

## 2012-04-03 DIAGNOSIS — R1084 Generalized abdominal pain: Secondary | ICD-10-CM | POA: Diagnosis not present

## 2012-04-03 DIAGNOSIS — Z48815 Encounter for surgical aftercare following surgery on the digestive system: Secondary | ICD-10-CM | POA: Diagnosis not present

## 2012-04-03 DIAGNOSIS — G8929 Other chronic pain: Secondary | ICD-10-CM | POA: Diagnosis not present

## 2012-04-03 DIAGNOSIS — Z4802 Encounter for removal of sutures: Secondary | ICD-10-CM | POA: Diagnosis not present

## 2012-04-04 DIAGNOSIS — L8995 Pressure ulcer of unspecified site, unstageable: Secondary | ICD-10-CM | POA: Diagnosis not present

## 2012-04-04 DIAGNOSIS — R627 Adult failure to thrive: Secondary | ICD-10-CM | POA: Diagnosis not present

## 2012-04-04 DIAGNOSIS — L02219 Cutaneous abscess of trunk, unspecified: Secondary | ICD-10-CM | POA: Diagnosis not present

## 2012-04-04 DIAGNOSIS — R7309 Other abnormal glucose: Secondary | ICD-10-CM | POA: Diagnosis not present

## 2012-04-04 DIAGNOSIS — R112 Nausea with vomiting, unspecified: Secondary | ICD-10-CM | POA: Diagnosis not present

## 2012-05-01 DIAGNOSIS — K861 Other chronic pancreatitis: Secondary | ICD-10-CM | POA: Diagnosis not present

## 2012-05-01 DIAGNOSIS — Z79899 Other long term (current) drug therapy: Secondary | ICD-10-CM | POA: Diagnosis not present

## 2012-05-01 DIAGNOSIS — R7309 Other abnormal glucose: Secondary | ICD-10-CM | POA: Diagnosis not present

## 2012-05-01 DIAGNOSIS — K432 Incisional hernia without obstruction or gangrene: Secondary | ICD-10-CM | POA: Diagnosis not present

## 2012-05-12 DIAGNOSIS — G4700 Insomnia, unspecified: Secondary | ICD-10-CM | POA: Diagnosis not present

## 2012-05-13 ENCOUNTER — Inpatient Hospital Stay (HOSPITAL_COMMUNITY)
Admission: EM | Admit: 2012-05-13 | Discharge: 2012-05-14 | DRG: 443 | Disposition: A | Discharge status: Other Acute Inpatient Hospital | Payer: Medicare Other | Attending: Internal Medicine | Admitting: Internal Medicine

## 2012-05-13 ENCOUNTER — Emergency Department (HOSPITAL_COMMUNITY): Payer: Medicare Other

## 2012-05-13 ENCOUNTER — Encounter (HOSPITAL_COMMUNITY): Payer: Self-pay | Admitting: *Deleted

## 2012-05-13 DIAGNOSIS — Z87442 Personal history of urinary calculi: Secondary | ICD-10-CM

## 2012-05-13 DIAGNOSIS — G43909 Migraine, unspecified, not intractable, without status migrainosus: Secondary | ICD-10-CM

## 2012-05-13 DIAGNOSIS — R109 Unspecified abdominal pain: Secondary | ICD-10-CM

## 2012-05-13 DIAGNOSIS — K769 Liver disease, unspecified: Secondary | ICD-10-CM

## 2012-05-13 DIAGNOSIS — Z452 Encounter for adjustment and management of vascular access device: Secondary | ICD-10-CM | POA: Diagnosis not present

## 2012-05-13 DIAGNOSIS — K439 Ventral hernia without obstruction or gangrene: Secondary | ICD-10-CM | POA: Diagnosis present

## 2012-05-13 DIAGNOSIS — Z794 Long term (current) use of insulin: Secondary | ICD-10-CM

## 2012-05-13 DIAGNOSIS — F329 Major depressive disorder, single episode, unspecified: Secondary | ICD-10-CM | POA: Diagnosis present

## 2012-05-13 DIAGNOSIS — K5900 Constipation, unspecified: Secondary | ICD-10-CM | POA: Diagnosis present

## 2012-05-13 DIAGNOSIS — K7689 Other specified diseases of liver: Secondary | ICD-10-CM | POA: Diagnosis present

## 2012-05-13 DIAGNOSIS — I880 Nonspecific mesenteric lymphadenitis: Secondary | ICD-10-CM | POA: Diagnosis present

## 2012-05-13 DIAGNOSIS — I81 Portal vein thrombosis: Secondary | ICD-10-CM | POA: Diagnosis not present

## 2012-05-13 DIAGNOSIS — E119 Type 2 diabetes mellitus without complications: Secondary | ICD-10-CM | POA: Diagnosis present

## 2012-05-13 DIAGNOSIS — F319 Bipolar disorder, unspecified: Secondary | ICD-10-CM | POA: Diagnosis not present

## 2012-05-13 DIAGNOSIS — Z79899 Other long term (current) drug therapy: Secondary | ICD-10-CM

## 2012-05-13 DIAGNOSIS — Z8719 Personal history of other diseases of the digestive system: Secondary | ICD-10-CM

## 2012-05-13 DIAGNOSIS — S93409A Sprain of unspecified ligament of unspecified ankle, initial encounter: Secondary | ICD-10-CM

## 2012-05-13 DIAGNOSIS — Z9889 Other specified postprocedural states: Secondary | ICD-10-CM | POA: Diagnosis not present

## 2012-05-13 DIAGNOSIS — K5903 Drug induced constipation: Secondary | ICD-10-CM

## 2012-05-13 DIAGNOSIS — F3289 Other specified depressive episodes: Secondary | ICD-10-CM | POA: Diagnosis present

## 2012-05-13 DIAGNOSIS — R1011 Right upper quadrant pain: Secondary | ICD-10-CM | POA: Diagnosis not present

## 2012-05-13 LAB — URINALYSIS, ROUTINE W REFLEX MICROSCOPIC
Glucose, UA: >1000 mg/dL — AB
Ketones, ur: NEGATIVE mg/dL
Leukocytes, UA: NEGATIVE
Nitrite: NEGATIVE
Protein, ur: NEGATIVE mg/dL
Urobilinogen, UA: 1 mg/dL (ref 0.0–1.0)

## 2012-05-13 LAB — CBC WITH DIFFERENTIAL/PLATELET
Eosinophils Relative: 1 % (ref 0–5)
HCT: 38.7 % (ref 36.0–46.0)
Hemoglobin: 12.9 g/dL (ref 12.0–15.0)
Lymphocytes Relative: 48 % — ABNORMAL HIGH (ref 12–46)
MCHC: 33.3 g/dL (ref 30.0–36.0)
MCV: 91.1 fL (ref 78.0–100.0)
Monocytes Absolute: 0.2 10*3/uL (ref 0.1–1.0)
Monocytes Relative: 4 % (ref 3–12)
Neutro Abs: 2.4 10*3/uL (ref 1.7–7.7)
WBC: 5 10*3/uL (ref 4.0–10.5)

## 2012-05-13 LAB — COMPREHENSIVE METABOLIC PANEL
BUN: 10 mg/dL (ref 6–23)
CO2: 29 mEq/L (ref 19–32)
Chloride: 95 mEq/L — ABNORMAL LOW (ref 96–112)
Creatinine, Ser: 0.47 mg/dL — ABNORMAL LOW (ref 0.50–1.10)
GFR calc Af Amer: >90 mL/min (ref >90)
GFR calc non Af Amer: >90 mL/min (ref >90)
Glucose, Bld: 406 mg/dL — ABNORMAL HIGH (ref 70–99)
Total Bilirubin: 0.4 mg/dL (ref 0.3–1.2)

## 2012-05-13 LAB — URINE MICROSCOPIC-ADD ON

## 2012-05-13 LAB — LIPASE, BLOOD: Lipase: 21 U/L (ref 11–59)

## 2012-05-13 MED ORDER — DRONABINOL 5 MG PO CAPS: 5.0000 mg | ORAL_CAPSULE | Freq: Four times a day (QID) | ORAL | Status: DC | PRN

## 2012-05-13 MED ORDER — INSULIN ASPART 100 UNIT/ML ~~LOC~~ SOLN
0.0000 [IU] | Freq: Three times a day (TID) | SUBCUTANEOUS | Status: DC
  Administered 2012-05-14 (×2): 5 [IU] via SUBCUTANEOUS

## 2012-05-13 MED ORDER — HYDROMORPHONE HCL PF 2 MG/ML IJ SOLN
2.0000 mg | Freq: Once | INTRAMUSCULAR | Status: AC
  Administered 2012-05-13: 2 mg via INTRAVENOUS

## 2012-05-13 MED ORDER — DOCUSATE SODIUM 100 MG PO CAPS
100.0000 mg | ORAL_CAPSULE | Freq: Two times a day (BID) | ORAL | Status: DC
  Administered 2012-05-14: 100 mg via ORAL
  Filled 2012-05-13: qty 1

## 2012-05-13 MED ORDER — ONDANSETRON HCL 4 MG/2ML IJ SOLN
4.0000 mg | Freq: Once | INTRAMUSCULAR | Status: AC
  Administered 2012-05-13: 4 mg via INTRAVENOUS

## 2012-05-13 MED ORDER — KETOROLAC TROMETHAMINE 30 MG/ML IJ SOLN
30.0000 mg | Freq: Once | INTRAMUSCULAR | Status: AC
  Administered 2012-05-13: 30 mg via INTRAVENOUS

## 2012-05-13 MED ORDER — SODIUM CHLORIDE 0.9 % IV BOLUS (SEPSIS)
1000.0000 mL | Freq: Once | INTRAVENOUS | Status: AC
  Administered 2012-05-13: 1000 mL via INTRAVENOUS

## 2012-05-13 MED ORDER — ONDANSETRON HCL 4 MG/2ML IJ SOLN: 4.0000 mg | Freq: Three times a day (TID) | INTRAMUSCULAR | Status: AC | PRN

## 2012-05-13 MED ORDER — SODIUM CHLORIDE 0.9 % IJ SOLN
INTRAMUSCULAR | Status: AC
  Administered 2012-05-13: 10 mL
  Filled 2012-05-13: qty 3

## 2012-05-13 MED ORDER — ONDANSETRON HCL 4 MG PO TABS: 4.0000 mg | ORAL_TABLET | Freq: Four times a day (QID) | ORAL | Status: DC | PRN

## 2012-05-13 MED ORDER — SODIUM CHLORIDE 0.9 % IV SOLN
INTRAVENOUS | Status: DC
  Administered 2012-05-13: 500 mL via INTRAVENOUS

## 2012-05-13 MED ORDER — PANTOPRAZOLE SODIUM 40 MG IV SOLR
40.0000 mg | Freq: Two times a day (BID) | INTRAVENOUS | Status: DC
  Administered 2012-05-13: 40 mg via INTRAVENOUS
  Filled 2012-05-13: qty 40

## 2012-05-13 MED ORDER — HYDROMORPHONE HCL PF 2 MG/ML IJ SOLN
2.0000 mg | INTRAMUSCULAR | Status: DC | PRN
  Administered 2012-05-13: 2 mg via INTRAVENOUS
  Filled 2012-05-13: qty 1

## 2012-05-13 MED ORDER — KETOROLAC TROMETHAMINE 30 MG/ML IJ SOLN
INTRAMUSCULAR | Status: AC
  Administered 2012-05-13: 30 mg via INTRAVENOUS
  Filled 2012-05-13: qty 1

## 2012-05-13 MED ORDER — ZOLPIDEM TARTRATE 5 MG PO TABS
5.0000 mg | ORAL_TABLET | Freq: Every evening | ORAL | Status: DC | PRN
  Administered 2012-05-13: 5 mg via ORAL
  Filled 2012-05-13: qty 1

## 2012-05-13 MED ORDER — INSULIN ASPART 100 UNIT/ML ~~LOC~~ SOLN: 0.0000 [IU] | Freq: Every day | SUBCUTANEOUS | Status: DC

## 2012-05-13 MED ORDER — IOHEXOL 300 MG/ML  SOLN
100.0000 mL | Freq: Once | INTRAMUSCULAR | Status: AC | PRN
  Administered 2012-05-13: 100 mL via INTRAVENOUS

## 2012-05-13 MED ORDER — HYDROMORPHONE HCL PF 1 MG/ML IJ SOLN
1.0000 mg | Freq: Once | INTRAMUSCULAR | Status: AC
  Administered 2012-05-13: 1 mg via INTRAVENOUS
  Filled 2012-05-13: qty 1

## 2012-05-13 MED ORDER — HYDROMORPHONE HCL PF 2 MG/ML IJ SOLN
INTRAMUSCULAR | Status: AC
  Administered 2012-05-13: 2 mg via INTRAVENOUS
  Filled 2012-05-13: qty 1

## 2012-05-13 MED ORDER — HYDROMORPHONE HCL PF 2 MG/ML IJ SOLN
2.0000 mg | Freq: Once | INTRAMUSCULAR | Status: DC
  Filled 2012-05-13: qty 2

## 2012-05-13 MED ORDER — INSULIN GLARGINE 100 UNIT/ML ~~LOC~~ SOLN
5.0000 [IU] | Freq: Every day | SUBCUTANEOUS | Status: DC
  Administered 2012-05-13: 5 [IU] via SUBCUTANEOUS

## 2012-05-13 MED ORDER — ONDANSETRON HCL 4 MG/2ML IJ SOLN
INTRAMUSCULAR | Status: AC
  Administered 2012-05-13: 4 mg via INTRAVENOUS
  Filled 2012-05-13: qty 2

## 2012-05-13 MED ORDER — HYDROMORPHONE HCL PF 1 MG/ML IJ SOLN
2.0000 mg | INTRAMUSCULAR | Status: AC | PRN
  Administered 2012-05-13 – 2012-05-14 (×6): 2 mg via INTRAVENOUS
  Filled 2012-05-13 (×7): qty 2

## 2012-05-13 MED ORDER — POLYETHYLENE GLYCOL 3350 17 G PO PACK: 17.0000 g | PACK | Freq: Every day | ORAL | Status: DC | PRN

## 2012-05-13 MED ORDER — GABAPENTIN 400 MG PO CAPS
800.0000 mg | ORAL_CAPSULE | Freq: Three times a day (TID) | ORAL | Status: DC
  Administered 2012-05-13 – 2012-05-14 (×2): 800 mg via ORAL
  Filled 2012-05-13 (×2): qty 2

## 2012-05-13 MED ORDER — ONDANSETRON HCL 4 MG/2ML IJ SOLN
4.0000 mg | Freq: Four times a day (QID) | INTRAMUSCULAR | Status: DC | PRN
  Administered 2012-05-13 – 2012-05-14 (×2): 4 mg via INTRAVENOUS
  Filled 2012-05-13 (×2): qty 2

## 2012-05-13 MED ORDER — ONDANSETRON HCL 4 MG/2ML IJ SOLN
4.0000 mg | Freq: Once | INTRAMUSCULAR | Status: AC
  Administered 2012-05-13: 4 mg via INTRAVENOUS
  Filled 2012-05-13: qty 2

## 2012-05-13 MED ORDER — TEMAZEPAM 15 MG PO CAPS
15.0000 mg | ORAL_CAPSULE | Freq: Every day | ORAL | Status: DC
  Administered 2012-05-13: 15 mg via ORAL
  Filled 2012-05-13: qty 1

## 2012-05-13 MED ORDER — INSULIN ASPART 100 UNIT/ML ~~LOC~~ SOLN
5.0000 [IU] | Freq: Once | SUBCUTANEOUS | Status: AC
  Administered 2012-05-13: 5 [IU] via SUBCUTANEOUS
  Filled 2012-05-13: qty 1

## 2012-05-13 MED ORDER — ENOXAPARIN SODIUM 60 MG/0.6ML ~~LOC~~ SOLN
1.0000 mg/kg | Freq: Two times a day (BID) | SUBCUTANEOUS | Status: DC
  Administered 2012-05-13 – 2012-05-14 (×2): 55 mg via SUBCUTANEOUS
  Filled 2012-05-13 (×2): qty 0.6

## 2012-05-13 MED ORDER — SODIUM CHLORIDE 0.9 % IV SOLN
INTRAVENOUS | Status: DC
  Administered 2012-05-13: 23:00:00 via INTRAVENOUS
  Administered 2012-05-14: 1000 mL via INTRAVENOUS

## 2012-05-13 MED ORDER — GABAPENTIN 800 MG PO TABS
800.0000 mg | ORAL_TABLET | Freq: Three times a day (TID) | ORAL | Status: DC
  Filled 2012-05-13 (×3): qty 1

## 2012-05-13 NOTE — ED Notes (Signed)
Pt c/o abd pain and nausea that started this am, was seen at Dr. Fletcher Anon office and sent to er. Pt states that the pain feels like when her pancreatitis is "flared up",.

## 2012-05-13 NOTE — ED Notes (Signed)
Report called to Selena Batten, RN on unit 300

## 2012-05-13 NOTE — H&P (Signed)
[facility] History and Physical  [name] [name]:[id number] DOB: 1971-10-06 DOA: 05/13/2012  Referring physician: Adonis Brook, MD PCP: Ardyth Gal, MD  Chief Complaint: Abdominal Pain  HPI:  41 yo with history of diabetes and severe pancreatitis s/p whipple procedure at [facility] in 2011 had been doing well since that time but had acute onset abdominal pain which started the morning prior to admission. The pain is predominantly epigastric, feels like pain she had with pancreatitis 2 years ago, worse with PO intake, she has mild nausea, mostly "stabbing" 9/10 pain, feeling of fullness, no vomiting, she has mild constipation at baseline but has basically had no changes in her normal bowel habits, No fevers, chills, or chest pain, SOB. Was seen at [facility] 2 weeks ago, and had a normal follow-up visit. In the ED CT scan of her Abdomen showed acute portal vein thrombosis and a complex liver lesion.  Review of Systems:  Review of Systems  Constitutional: Positive for weight loss. Negative for fever, chills, malaise/fatigue and diaphoresis.  HENT: Negative.   Eyes: Negative.   Respiratory: Negative.   Cardiovascular: Negative.   Gastrointestinal: Positive for nausea, abdominal pain, constipation and melena. Negative for heartburn, vomiting, diarrhea and blood in stool.  Genitourinary: Negative.   Musculoskeletal: Negative.   Skin: Negative.   Neurological: Negative.  Negative for weakness.  Endo/Heme/Allergies: Negative.   Psychiatric/Behavioral: Positive for depression. Negative for suicidal ideas, hallucinations, memory loss and substance abuse. The patient is not nervous/anxious and does not have insomnia.   All other systems reviewed and are negative.     Past Medical History  Diagnosis Date  . Pancreatitis    Past Surgical History  Procedure Date  . Whipple procedure   . Intestinal blockage   . Abdominal surgery   . Abdominal hysterectomy    Social History:  reports that  she has never smoked. She does not have any smokeless tobacco history on file. She reports that she does not drink alcohol or use illicit drugs.   Allergies  Allergen Reactions  . Metoclopramide Hcl     REGLAN REACTION: "lock-jaw"    No family history on file. No Hx Liver disease or cancer.  Prior to Admission medications   Medication Sig Start Date End Date Taking? Authorizing Provider  Cinnamon 500 MG capsule Take 500 mg by mouth 2 (two) times daily.   Yes Historical Provider, MD  dronabinol (MARINOL) 5 MG capsule Take 5 mg by mouth 4 (four) times daily as needed.   Yes Historical Provider, MD  gabapentin (NEURONTIN) 800 MG tablet Take 800 mg by mouth every 8 (eight) hours.   Yes Historical Provider, MD  HYDROmorphone (DILAUDID) 2 MG tablet Take 2 mg by mouth every 6 (six) hours as needed. For pain   Yes Historical Provider, MD  insulin glargine (LANTUS SOLOSTAR) 100 UNIT/ML injection Inject 4 Units into the skin at bedtime.   Yes Historical Provider, MD  insulin lispro (HUMALOG KWIKPEN) 100 UNIT/ML injection Inject into the skin daily as needed. For spiking blood sugar levels   Yes Historical Provider, MD  morphine (MS CONTIN) 30 MG 12 hr tablet Take 30 mg by mouth 3 (three) times daily.   Yes Historical Provider, MD  omeprazole (PRILOSEC) 20 MG capsule Take 20 mg by mouth 2 (two) times daily.   Yes Historical Provider, MD  promethazine (PHENERGAN) 25 MG tablet Take 25 mg by mouth every 6 (six) hours as needed. For nausea   Yes Historical Provider, MD  temazepam (  RESTORIL) 15 MG capsule Take 15 mg by mouth at bedtime.   Yes Historical Provider, MD   Physical Exam: Filed Vitals:   05/13/12 1520  BP: 111/81  Pulse: 95  Temp: 98.7 F (37.1 C)  TempSrc: Oral  Resp: 18  Height: 5\' 4"  (1.626 m)  Weight: 53.978 kg (119 lb)  SpO2: 98%     General:  Mild distress, thin, pale woman resting on stretcher  Eyes: PERRL  ENT: clear  Neck: supple, no adenopathy  Cardiovascular:  RRR  Respiratory: CTAB  Abdomen: mildly distended, tender to pal epigastric, ventral hernia  Skin: no rashes  Musculoskeletal: normal  Psychiatric: normal affect, mood and insight  Neurologic: non-focal   Labs on Admission:  Basic Metabolic Panel:  Lab 05/13/12 1610  NA 134*  K 4.4  CL 95*  CO2 29  GLUCOSE 406*  BUN 10  CREATININE 0.47*  CALCIUM 9.5  MG --  PHOS --   Liver Function Tests:  Lab 05/13/12 1539  AST 44*  ALT 44*  ALKPHOS 129*  BILITOT 0.4  PROT 7.2  ALBUMIN 3.1*    Lab 05/13/12 1539  LIPASE 21  AMYLASE --   No results found for this basename: AMMONIA:5 in the last 168 hours CBC:  Lab 05/13/12 1539  WBC 5.0  NEUTROABS 2.4  HGB 12.9  HCT 38.7  MCV 91.1  PLT 212   Cardiac Enzymes:  Lab 05/13/12 1539  CKTOTAL --  CKMB --  CKMBINDEX --  TROPONINI <0.30    Radiological Exams on Admission: US Abdomen Complete  05/13/2012  *RADIOLOGY REPORT*  Clinical Data:  Right upper abdominal and epigastric pain. Previous cholecystectomy, Whipple.  COMPLETE ABDOMINAL ULTRASOUND  Comparison:  CT 02/24/2011  Findings:  Gallbladder:  Surgically absent  Common bile duct:  Dilated, 11.1 mm diameter.  Liver:  Echogenic parenchyma.  There is a 5 x 6 x 6.1 cm complex cystic lesion   at the anterior aspect of the left lobe.  There is suspicion of thrombosis of the right portal vein.  IVC:  Appears normal.  Pancreas:  Not visualized  Spleen:  8.6 cm craniocaudal length, unremarkable  Right Kidney:  10.9 cm. No hydronephrosis.  Well-preserved cortex. Normal size and parenchymal echotexture without focal abnormalities.  Left Kidney:  11.3 cm. No hydronephrosis.  Well-preserved cortex. Normal size and parenchymal echotexture without focal abnormalities.  Abdominal aorta:  No aneurysm identified.  IMPRESSION:  1.  Possible thrombosis of the right portal vein. 2.  Nonspecific 6.1 cm complex cystic process at the anterior margin of the left hepatic lobe, unclear whether this  represents an intrinsic liver lesion versus adjacent fluid collection or conceivably anatomic change related to the previous surgery.  If symptoms persist, CT with contrast may be useful for further evaluation.  Original Report Authenticated By: Osa Craver, M.D.   Ct Abdomen Pelvis W Contrast  05/13/2012  *RADIOLOGY REPORT*  Clinical Data: Right quadrant abdominal pain history of Whipple procedure, gastric bypass, cholecystectomy  CT ABDOMEN AND PELVIS WITH CONTRAST  Technique:  Multidetector CT imaging of the abdomen and pelvis was performed following the standard protocol during bolus administration of intravenous contrast.  Contrast: OMNIPAQUE IOHEXOL 300 MG/ML  SOLN  Comparison: 05/13/2012 ultrasound, 02/24/2011 CT, 06/16/2010 CT  Findings: Normal heart size.  No pericardial effusion.  A trace right pleural effusion.  Bibasilar atelectasis evident.  No hiatal hernia.  Abdomen:  Extensive hypoattenuation of the liver parenchyma compatible with background hepatic steatosis or fatty infiltration.  Pneumobilia evident.  The patient is status post cholecystectomy, Whipple procedure, and gastric bypass.  No biliary dilatation.  Splenic vein, main portal vein, portal venous confluence all appear patent.  Filling defect is noted within a right portal vein branch into the anterior segment of the right hepatic lobe consistent with intrahepatic portal vein thrombosis.  Hepatic veins are patent.  Liver is enlarged.  In the left hepatic lobe medial segment, there is a hypodense cystic area measuring 4.6 x 5.1 cm which correlates with ultrasound finding.  This could represent a minimally complex hepatic cyst, versus a postoperative chronic seroma or biloma.  No surrounding inflammatory process to suggest a definite abscess.  Spleen is also enlarged.  The SMV is patent.  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   Adrenal glands and kidneys demonstrate no acute process.  Pancreas demonstrates mild prominence of the pancreatic duct but stable. Postop changes from Whipple procedure in the pancreatic head region.  Diffuse mesenteric root strandy edema/inflammation with diffuse mildly enlarged mesenteric root lymph nodes.  This extends along the inferior margin the pancreas.  Mild pancreatitis not excluded versus mesenteric root lymphadenitis.  Postop changes from gastric bypass.  Negative for obstruction.  No free air evident.  No abdominal wall ventral hernia.  Pelvis:  Large amount retained stool throughout the colon compatible with constipation.  Hysterectomy noted.  The ovaries are normal in size.  Pelvic calcifications consistent with venous phleboliths.  Urinary bladder unremarkable.  No pelvic adenopathy, inguinal abnormality, hernia.  No acute abnormal osseous finding.  IMPRESSION: Medial segment left hepatic lobe 5 cm subcapsular cystic area noted, nonspecific.  This could represent interval development of an hepatic cyst versus chronic postoperative seroma or biloma. Abscess thought to be less likely as there are no surrounding inflammatory changes.  Extensive hepatic steatosis with intrahepatic portal vein branch thrombosis within the right hepatic lobe.  Otherwise the right, and left portal veins, portal vein confluence, main portal vein, splenic vein and mesenteric veins all remain patent.  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  Pneumobilia, status post cholecystectomy  Prior Whipple procedure and gastric bypass without evidence of recurrent small bowel obstruction  Mesenteric root strandy fluid/edema with diffuse mild mesenteric adenopathy can be seen with chronic mesenteric adenitis.  Mild pancreatitis not entirely excluded.  Large amount retained stool distally compatible with constipation  Prior hysterectomy  Original Report Authenticated By: Judie Petit. Ruel Favors, M.D.    EKG: Independently reviewed.   Assessment/Plan Active Problems:  Portal vein thrombosis  Lesion of liver   1. 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 Likely the etiology of her pain, no fever or chills to suggest septic emboli or phelebitis, generally uncomplicated other than pain and probably worsening ascites. Source concerning, details of her whipple are unclear, no known history of malignancy or CT findings to suggest malignancy except for complex cystic lesion on her liver.   Lovenox with transition to oral anticoagulant  Check and AFP  Will need f/u at [facility]  Pain and symptom control, has very high opiate tolerance  2. Constipation  Stool sofner and Miralax  3. DM/hyperglycemia, secondary to pancreatic dysfunction  Lantus for basal control and SSI for meal coverage  Code Status: Full Code Family Communication: Plan of care discussed with patient at bedside Disposition Plan: Home when medically stable  Time spent: 70  minutes  [facility] Pager [phone]  If 7PM-7AM, please contact night-coverage [email or web address] Password [facility] 05/13/2012, 9:33 PM

## 2012-05-13 NOTE — ED Provider Notes (Signed)
History  This chart was scribed for Glynn Octave, MD by Erskine Emery. This patient was seen in room APA10/APA10 and the patient's care was started at 15:34.   CSN: [id number]  Arrival date & time 05/13/12  1505   First MD Initiated Contact with Patient 05/13/12 1534      Chief Complaint  Patient presents with  . Pancreatitis    (Consider location/radiation/quality/duration/timing/severity/associated sxs/prior treatment) HPI [name] [name] is a 41 y.o. female who presents to the Emergency Department complaining of constant right sided abdominal pain since this morning with associated nausea but no emesis, diarrhea, fever, cough, chest pain, back pain, SOB, or changes in bowel movements. Pt reports eating aggravates the pain and not even pain medication (Dilaudid) permanently relieves it. Pt reports she has a hernia in the abdomen. Pt also reports a h/o pancreatitis about 2 years ago from an unknown cause. Pt received a whipple for this at [facility]. Pt claims her pain today is similar to that she experienced before the whipple. Pt also has a h/o cholecystectomy (12 years ago) but no hysterectomy. Pt denies any h/o drinking. Pt is only allergic to Reglan.     Past Medical History  Diagnosis Date  . Pancreatitis   . Diabetes mellitus     Past Surgical History  Procedure Date  . Whipple procedure   . Intestinal blockage   . Abdominal surgery   . Abdominal hysterectomy     History reviewed. No pertinent family history.  History  Substance Use Topics  . Smoking status: Never Smoker   . Smokeless tobacco: Not on file  . Alcohol Use: No    OB History    Grav Para Term Preterm Abortions TAB SAB Ect Mult Living                  Review of Systems A complete 10 system review of systems was obtained and all systems are negative except as noted in the HPI and PMH.    Allergies  Metoclopramide hcl  Home Medications   No current outpatient prescriptions on file.  Triage  Vitals: BP 111/81  Pulse 95  Temp 98.7 F (37.1 C) (Oral)  Resp 18  Ht 5\' 4"  (1.626 m)  Wt 119 lb (53.978 kg)  BMI 20.43 kg/m2  SpO2 98%  Physical Exam  Nursing note and vitals reviewed. Constitutional: She is oriented to person, place, and time. She appears well-developed and well-nourished. No distress.  HENT:  Head: Normocephalic and atraumatic.  Eyes: EOM are normal.  Neck: Neck supple. No tracheal deviation present.  Cardiovascular: Normal rate, regular rhythm and normal heart sounds.   Pulmonary/Chest: Effort normal and breath sounds normal. No respiratory distress.  Abdominal: Soft. She exhibits no distension. There is tenderness. There is guarding.       Abdominal tenderness with guarding, worst in the RUQ and epigastric region. Reducible periumbilical hernia.  Musculoskeletal: Normal range of motion. She exhibits no edema.  Neurological: She is alert and oriented to person, place, and time.  Skin: Skin is warm and dry.       Multiple well-healed surgical incisions diffusely on the abdomen.  Psychiatric: She has a normal mood and affect. Her behavior is normal.    ED Course  Procedures (including critical care time) DIAGNOSTIC STUDIES: Oxygen Saturation is 98% on room air, normal by my interpretation.    COORDINATION OF CARE: 15:45--Medication order: sodium chloride 0.9% bolus 1,000 mL--once  15:53--I evaluated the patient and we discussed a treatment  plan including blood work and abdominal CT to which the pt agreed.   16:00--Medication orders: Hydromorphone (Dilaudid) injection 1 mg--once   Ondansetron (Zofran) injection 4 mg--once  16:30--Medication orders: Hydromorphone (Dilaudid) injection 1 mg--once   Insulin aspart (Novolog) injection 5 units--once  18:10--I rechecked the pt who is still in pain. She reports she takes about 4 mg of Dilaudid every 6 hours at home so her tolerance is high. She told me that she is diabetic and didn't take her medication (Lantis)  last night. We discussed the results of her exams thus far and possibly admission.  20:06--I rechecked the pt. We discussed her being admitted to the hospital to get her pain under control.   20:20--Pt is to be admitted by Dr. Phillips Odor.   Labs Reviewed  CBC WITH DIFFERENTIAL - Abnormal; Notable for the following:    Lymphocytes Relative 48 (*)     All other components within normal limits  COMPREHENSIVE METABOLIC PANEL - Abnormal; Notable for the following:    Sodium 134 (*)     Chloride 95 (*)     Glucose, Bld 406 (*)     Creatinine, Ser 0.47 (*)     Albumin 3.1 (*)     AST 44 (*)     ALT 44 (*)     Alkaline Phosphatase 129 (*)     All other components within normal limits  URINALYSIS, ROUTINE W REFLEX MICROSCOPIC - Abnormal; Notable for the following:    Glucose, UA >1000 (*)     All other components within normal limits  GLUCOSE, CAPILLARY - Abnormal; Notable for the following:    Glucose-Capillary 132 (*)     All other components within normal limits  LIPASE, BLOOD  TROPONIN I  LACTIC ACID, PLASMA  URINE MICROSCOPIC-ADD ON  COMPREHENSIVE METABOLIC PANEL  CBC  PROTIME-INR   US Abdomen Complete  05/13/2012  *RADIOLOGY REPORT*  Clinical Data:  Right upper abdominal and epigastric pain. Previous cholecystectomy, Whipple.  COMPLETE ABDOMINAL ULTRASOUND  Comparison:  CT 02/24/2011  Findings:  Gallbladder:  Surgically absent  Common bile duct:  Dilated, 11.1 mm diameter.  Liver:  Echogenic parenchyma.  There is a 5 x 6 x 6.1 cm complex cystic lesion   at the anterior aspect of the left lobe.  There is suspicion of thrombosis of the right portal vein.  IVC:  Appears normal.  Pancreas:  Not visualized  Spleen:  8.6 cm craniocaudal length, unremarkable  Right Kidney:  10.9 cm. No hydronephrosis.  Well-preserved cortex. Normal size and parenchymal echotexture without focal abnormalities.  Left Kidney:  11.3 cm. No hydronephrosis.  Well-preserved cortex. Normal size and parenchymal  echotexture without focal abnormalities.  Abdominal aorta:  No aneurysm identified.  IMPRESSION:  1.  Possible thrombosis of the right portal vein. 2.  Nonspecific 6.1 cm complex cystic process at the anterior margin of the left hepatic lobe, unclear whether this represents an intrinsic liver lesion versus adjacent fluid collection or conceivably anatomic change related to the previous surgery.  If symptoms persist, CT with contrast may be useful for further evaluation.  Original Report Authenticated By: Osa Craver, M.D.   Ct Abdomen Pelvis W Contrast  05/13/2012  *RADIOLOGY REPORT*  Clinical Data: Right quadrant abdominal pain history of Whipple procedure, gastric bypass, cholecystectomy  CT ABDOMEN AND PELVIS WITH CONTRAST  Technique:  Multidetector CT imaging of the abdomen and pelvis was performed following the standard protocol during bolus administration of intravenous contrast.  Contrast: OMNIPAQUE IOHEXOL  300 MG/ML  SOLN  Comparison: 05/13/2012 ultrasound, 02/24/2011 CT, 06/16/2010 CT  Findings: Normal heart size.  No pericardial effusion.  A trace right pleural effusion.  Bibasilar atelectasis evident.  No hiatal hernia.  Abdomen:  Extensive hypoattenuation of the liver parenchyma compatible with background hepatic steatosis or fatty infiltration. Pneumobilia evident.  The patient is status post cholecystectomy, Whipple procedure, and gastric bypass.  No biliary dilatation.  Splenic vein, main portal vein, portal venous confluence all appear patent.  Filling defect is noted within a right portal vein branch into the anterior segment of the right hepatic lobe consistent with intrahepatic portal vein thrombosis.  Hepatic veins are patent.  Liver is enlarged.  In the left hepatic lobe medial segment, there is a hypodense cystic area measuring 4.6 x 5.1 cm which correlates with ultrasound finding.  This could represent a minimally complex hepatic cyst, versus a postoperative chronic seroma or  biloma.  No surrounding inflammatory process to suggest a definite abscess.  Spleen is also enlarged.  The SMV is patent.  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  Adrenal glands and kidneys demonstrate no acute process.  Pancreas demonstrates mild prominence of the pancreatic duct but stable. Postop changes from Whipple procedure in the pancreatic head region.  Diffuse mesenteric root strandy edema/inflammation with diffuse mildly enlarged mesenteric root lymph nodes.  This extends along the inferior margin the pancreas.  Mild pancreatitis not excluded versus mesenteric root lymphadenitis.  Postop changes from gastric bypass.  Negative for obstruction.  No free air evident.  No abdominal wall ventral hernia.  Pelvis:  Large amount retained stool throughout the colon compatible with constipation.  Hysterectomy noted.  The ovaries are normal in size.  Pelvic calcifications consistent with venous phleboliths.  Urinary bladder unremarkable.  No pelvic adenopathy, inguinal abnormality, hernia.  No acute abnormal osseous finding.  IMPRESSION: Medial segment left hepatic lobe 5 cm subcapsular cystic area noted, nonspecific.  This could represent interval development of an hepatic cyst versus chronic postoperative seroma or biloma. Abscess thought to be less likely as there are no surrounding inflammatory changes.  Extensive hepatic steatosis with intrahepatic portal vein branch thrombosis within the right hepatic lobe.  Otherwise the right, and left portal veins, portal vein confluence, main portal vein, splenic vein and mesenteric veins all remain patent.  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  Pneumobilia, status post cholecystectomy  Prior Whipple procedure and gastric bypass without evidence of recurrent small bowel obstruction  Mesenteric root strandy fluid/edema  with diffuse mild mesenteric adenopathy can be seen with chronic mesenteric adenitis.  Mild pancreatitis not entirely excluded.  Large amount retained stool distally compatible with constipation  Prior hysterectomy  Original Report Authenticated By: Judie Petit. Ruel Favors, M.D.     1. Abdominal pain   2. Portal vein thrombosis       MDM  Epigastric and right upper quadrant pain since this morning with nausea similar to previous pancreatitis. No vomiting. No fever or change in bowel habits.  Ultrasound and CT findings reviewed with patient. Pain remains severe. Liver cyst with possible portal vein thrombosis noted.  Pain remains poorly controlled in ED.  Will need admission for further work up and probable anticoagulation for portal vein thrombus. D/w Dr. Phillips Odor.  I personally performed the services described in this documentation, which was scribed in my presence.  The recorded information has been reviewed and considered.    Jeannett Senior  Merriel Zinger, MD 05/14/12 1610

## 2012-05-14 ENCOUNTER — Encounter (HOSPITAL_COMMUNITY): Payer: Medicare Other

## 2012-05-14 ENCOUNTER — Inpatient Hospital Stay (HOSPITAL_COMMUNITY): Payer: Medicare Other

## 2012-05-14 DIAGNOSIS — K432 Incisional hernia without obstruction or gangrene: Secondary | ICD-10-CM | POA: Diagnosis present

## 2012-05-14 DIAGNOSIS — I81 Portal vein thrombosis: Secondary | ICD-10-CM | POA: Diagnosis not present

## 2012-05-14 DIAGNOSIS — R935 Abnormal findings on diagnostic imaging of other abdominal regions, including retroperitoneum: Secondary | ICD-10-CM | POA: Diagnosis not present

## 2012-05-14 DIAGNOSIS — F319 Bipolar disorder, unspecified: Secondary | ICD-10-CM

## 2012-05-14 DIAGNOSIS — G8929 Other chronic pain: Secondary | ICD-10-CM | POA: Diagnosis present

## 2012-05-14 DIAGNOSIS — K219 Gastro-esophageal reflux disease without esophagitis: Secondary | ICD-10-CM | POA: Diagnosis present

## 2012-05-14 DIAGNOSIS — K7689 Other specified diseases of liver: Secondary | ICD-10-CM | POA: Diagnosis not present

## 2012-05-14 DIAGNOSIS — Z452 Encounter for adjustment and management of vascular access device: Secondary | ICD-10-CM | POA: Diagnosis not present

## 2012-05-14 DIAGNOSIS — Z87442 Personal history of urinary calculi: Secondary | ICD-10-CM | POA: Diagnosis not present

## 2012-05-14 DIAGNOSIS — F3110 Bipolar disorder, current episode manic without psychotic features, unspecified: Secondary | ICD-10-CM | POA: Diagnosis present

## 2012-05-14 DIAGNOSIS — E139 Other specified diabetes mellitus without complications: Secondary | ICD-10-CM | POA: Diagnosis not present

## 2012-05-14 DIAGNOSIS — E119 Type 2 diabetes mellitus without complications: Secondary | ICD-10-CM | POA: Diagnosis present

## 2012-05-14 DIAGNOSIS — R1084 Generalized abdominal pain: Secondary | ICD-10-CM | POA: Diagnosis present

## 2012-05-14 DIAGNOSIS — R188 Other ascites: Secondary | ICD-10-CM | POA: Diagnosis not present

## 2012-05-14 LAB — GLUCOSE, CAPILLARY
Glucose-Capillary: 223 mg/dL — ABNORMAL HIGH (ref 70–99)
Glucose-Capillary: 234 mg/dL — ABNORMAL HIGH (ref 70–99)

## 2012-05-14 LAB — CBC
MCH: 31.1 pg (ref 26.0–34.0)
MCHC: 33.5 g/dL (ref 30.0–36.0)
Platelets: 177 10*3/uL (ref 150–400)
RBC: 3.63 MIL/uL — ABNORMAL LOW (ref 3.87–5.11)

## 2012-05-14 LAB — COMPREHENSIVE METABOLIC PANEL
AST: 37 U/L (ref 0–37)
CO2: 26 mEq/L (ref 19–32)
Chloride: 105 mEq/L (ref 96–112)
Creatinine, Ser: 0.48 mg/dL — ABNORMAL LOW (ref 0.50–1.10)
GFR calc non Af Amer: >90 mL/min (ref >90)
Total Bilirubin: 0.3 mg/dL (ref 0.3–1.2)

## 2012-05-14 LAB — PROTIME-INR: Prothrombin Time: 14.8 seconds (ref 11.6–15.2)

## 2012-05-14 MED ORDER — SODIUM CHLORIDE 0.9 % IJ SOLN
INTRAMUSCULAR | Status: AC
  Filled 2012-05-14: qty 3

## 2012-05-14 MED ORDER — HYDROMORPHONE HCL PF 1 MG/ML IJ SOLN: 2.0000 mg | INTRAMUSCULAR | Status: DC | PRN

## 2012-05-14 MED ORDER — WARFARIN SODIUM 7.5 MG PO TABS: 7.5000 mg | ORAL_TABLET | Freq: Once | ORAL | Status: DC

## 2012-05-14 MED ORDER — COUMADIN BOOK
Freq: Once | Status: AC
  Administered 2012-05-14: 09:00:00
  Filled 2012-05-14: qty 1

## 2012-05-14 MED ORDER — INSULIN GLARGINE 100 UNIT/ML ~~LOC~~ SOLN: 10.0000 [IU] | Freq: Every day | SUBCUTANEOUS | Status: DC

## 2012-05-14 MED ORDER — WARFARIN VIDEO: Freq: Once | Status: DC

## 2012-05-14 MED ORDER — INSULIN ASPART 100 UNIT/ML ~~LOC~~ SOLN: 0.0000 [IU] | Freq: Three times a day (TID) | SUBCUTANEOUS | Status: DC

## 2012-05-14 MED ORDER — MORPHINE SULFATE 4 MG/ML IJ SOLN
4.0000 mg | Freq: Once | INTRAMUSCULAR | Status: AC
  Administered 2012-05-14: 4 mg via SUBCUTANEOUS
  Filled 2012-05-14: qty 1

## 2012-05-14 MED ORDER — ENOXAPARIN SODIUM 150 MG/ML ~~LOC~~ SOLN: 1.0000 mg/kg | Freq: Two times a day (BID) | SUBCUTANEOUS | Status: DC

## 2012-05-14 MED ORDER — INSULIN ASPART 100 UNIT/ML ~~LOC~~ SOLN: 0.0000 [IU] | Freq: Every day | SUBCUTANEOUS | Status: DC

## 2012-05-14 MED ORDER — WARFARIN - PHARMACIST DOSING INPATIENT: Freq: Every day | Status: DC

## 2012-05-14 MED ORDER — SODIUM CHLORIDE 0.9 % IJ SOLN: 10.0000 mL | INTRAMUSCULAR | Status: DC | PRN

## 2012-05-14 MED ORDER — SODIUM CHLORIDE 0.9 % IJ SOLN: 10.0000 mL | Freq: Two times a day (BID) | INTRAMUSCULAR | Status: DC

## 2012-05-14 MED ORDER — PANTOPRAZOLE SODIUM 40 MG PO TBEC
40.0000 mg | DELAYED_RELEASE_TABLET | Freq: Two times a day (BID) | ORAL | Status: DC
  Filled 2012-05-14: qty 1

## 2012-05-14 MED ORDER — MORPHINE SULFATE 30 MG PO TABS: 30.0000 mg | ORAL_TABLET | ORAL | Status: AC | PRN

## 2012-05-14 MED ORDER — ONDANSETRON HCL 4 MG/2ML IJ SOLN: 4.0000 mg | Freq: Four times a day (QID) | INTRAMUSCULAR | Status: DC | PRN

## 2012-05-14 MED ORDER — MORPHINE SULFATE 15 MG PO TABS
30.0000 mg | ORAL_TABLET | ORAL | Status: DC | PRN
  Administered 2012-05-14: 30 mg via ORAL
  Filled 2012-05-14: qty 2

## 2012-05-14 NOTE — Progress Notes (Deleted)
[facility]  Aware of no IV access, Pt. Taken to [facility]

## 2012-05-14 NOTE — Progress Notes (Deleted)
Report called to D. Education officer, museum at [employer], pt. To be transferred per [facility].

## 2012-05-14 NOTE — Progress Notes (Signed)
UR chart review completed.  

## 2012-05-14 NOTE — Discharge Summary (Signed)
Physician Discharge Summary  [name] [name]:[id number] DOB: 1971-08-15 DOA: 05/13/2012    Admit date: 05/13/2012 Discharge date: 05/14/2012  Recommendations for Outpatient Follow-up:  1. Transferred patient to [facility] for further management.  Discharge Diagnoses:  1. Portal vein thrombosis. 2. Possible mesenteric adenitis. 3. Status post Whipple's procedure in 2011 complicated by perforated viscus in May of 2013. Patient had both surgeries at [facility].   Discharge Condition: Stable.  Diet recommendation: Clear liquid diet.  Wt Readings from Last 3 Encounters:  05/13/12 55.6 kg (122 lb 9.2 oz)  11/29/08 78.472 kg (173 lb)    History of present illness:  This unfortunate 41 year old lady was admitted to the hospital with symptoms of abdominal pain. Patient has a history of diabetes and severe pancreatitis status post Whipple procedure at [facility] in 2011. She had been doing reasonably well until May of 2013 when she presented with septic shock and a perforated viscus. She was then transferred immediately to [facility] whereupon she had further treatment. The details of this are not entirely clear. Postoperatively she has been doing well until the abdominal pain which started approximately couple of days ago.  Hospital Course:  Patient was admitted to the hospital and CT scan of the abdomen was suggestive of portal vein thrombosis as well as possible mesenteric adenitis. She is therefore being anticoagulated with Lovenox and warfarin. She's been given analgesia for pain relief. IV access is an issue, it has not been possible to place a PICC line I had ordered. She may well need a central line. I've spoken with the patient's surgeon, Dr. Cliffton Asters, at [facility], who is accepted the patient in transfer.  Procedures:  None.  Consultations:  None.  Discharge Exam: Filed Vitals:   05/14/12 0522  BP: 87/58  Pulse: 78  Temp: 98.2 F (36.8 C)  Resp: 16   Filed Vitals:   05/13/12  2132 05/13/12 2140 05/13/12 2150 05/14/12 0522  BP: 94/69 80/44 88/59  87/58  Pulse: 69 98 77 78  Temp: 98 F (36.7 C)  98.2 F (36.8 C) 98.2 F (36.8 C)  TempSrc: Oral  Oral Oral  Resp: 20 22 20 16   Height:   5\' 4"  (1.626 m)   Weight:   55.6 kg (122 lb 9.2 oz)   SpO2: 99% 97% 92% 96%    General: She looks systemically well and does not appear to be septic. She does appear to be hemodynamically stable. Cardiovascular: Heart sounds are present and normal without murmurs. Respiratory: Lung fields are clear. Abdomen: Soft but tenderness generalized fashion.  Discharge Instructions  Discharge Orders    Future Orders Please Complete By Expires   Diet - low sodium heart healthy      Increase activity slowly        Medication List  As of 05/14/2012  1:49 PM   STOP taking these medications         Cinnamon 500 MG capsule      HUMALOG KWIKPEN 100 UNIT/ML injection      HYDROmorphone 2 MG tablet      morphine 30 MG 12 hr tablet      promethazine 25 MG tablet         TAKE these medications         dronabinol 5 MG capsule   Commonly known as: MARINOL   Take 5 mg by mouth 4 (four) times daily as needed.      enoxaparin 150 MG/ML injection   Commonly known as: LOVENOX  Inject 0.36 mLs (55 mg total) into the skin every 12 (twelve) hours.      gabapentin 800 MG tablet   Commonly known as: NEURONTIN   Take 800 mg by mouth every 8 (eight) hours.      HYDROmorphone 1 MG/ML Soln injection   Commonly known as: DILAUDID   Inject 2 mLs (2 mg total) into the vein every 2 (two) hours as needed for moderate pain or severe pain.      insulin aspart 100 UNIT/ML injection   Commonly known as: novoLOG   Inject 0-15 Units into the skin 3 (three) times daily with meals.      insulin aspart 100 UNIT/ML injection   Commonly known as: novoLOG   Inject 0-5 Units into the skin at bedtime.      insulin glargine 100 UNIT/ML injection   Commonly known as: LANTUS   Inject 10 Units into the  skin at bedtime.      morphine 30 MG tablet   Commonly known as: MSIR   Take 1 tablet (30 mg total) by mouth every 4 (four) hours as needed.      omeprazole 20 MG capsule   Commonly known as: PRILOSEC   Take 20 mg by mouth 2 (two) times daily.      ondansetron 4 MG/2ML Soln injection   Commonly known as: ZOFRAN   Inject 2 mLs (4 mg total) into the vein every 6 (six) hours as needed for nausea.      temazepam 15 MG capsule   Commonly known as: RESTORIL   Take 15 mg by mouth at bedtime.      warfarin 7.5 MG tablet   Commonly known as: COUMADIN   Take 1 tablet (7.5 mg total) by mouth one time only at 6 PM.              The results of significant diagnostics from this hospitalization (including imaging, microbiology, ancillary and laboratory) are listed below for reference.    Significant Diagnostic Studies: US Abdomen Complete  05/13/2012  *RADIOLOGY REPORT*  Clinical Data:  Right upper abdominal and epigastric pain. Previous cholecystectomy, Whipple.  COMPLETE ABDOMINAL ULTRASOUND  Comparison:  CT 02/24/2011  Findings:  Gallbladder:  Surgically absent  Common bile duct:  Dilated, 11.1 mm diameter.  Liver:  Echogenic parenchyma.  There is a 5 x 6 x 6.1 cm complex cystic lesion   at the anterior aspect of the left lobe.  There is suspicion of thrombosis of the right portal vein.  IVC:  Appears normal.  Pancreas:  Not visualized  Spleen:  8.6 cm craniocaudal length, unremarkable  Right Kidney:  10.9 cm. No hydronephrosis.  Well-preserved cortex. Normal size and parenchymal echotexture without focal abnormalities.  Left Kidney:  11.3 cm. No hydronephrosis.  Well-preserved cortex. Normal size and parenchymal echotexture without focal abnormalities.  Abdominal aorta:  No aneurysm identified.  IMPRESSION:  1.  Possible thrombosis of the right portal vein. 2.  Nonspecific 6.1 cm complex cystic process at the anterior margin of the left hepatic lobe, unclear whether this represents an intrinsic  liver lesion versus adjacent fluid collection or conceivably anatomic change related to the previous surgery.  If symptoms persist, CT with contrast may be useful for further evaluation.  Original Report Authenticated By: Osa Craver, M.D.   Ct Abdomen Pelvis W Contrast  05/13/2012  *RADIOLOGY REPORT*  Clinical Data: Right quadrant abdominal pain history of Whipple procedure, gastric bypass, cholecystectomy  CT ABDOMEN AND PELVIS WITH  CONTRAST  Technique:  Multidetector CT imaging of the abdomen and pelvis was performed following the standard protocol during bolus administration of intravenous contrast.  Contrast: OMNIPAQUE IOHEXOL 300 MG/ML  SOLN  Comparison: 05/13/2012 ultrasound, 02/24/2011 CT, 06/16/2010 CT  Findings: Normal heart size.  No pericardial effusion.  A trace right pleural effusion.  Bibasilar atelectasis evident.  No hiatal hernia.  Abdomen:  Extensive hypoattenuation of the liver parenchyma compatible with background hepatic steatosis or fatty infiltration. Pneumobilia evident.  The patient is status post cholecystectomy, Whipple procedure, and gastric bypass.  No biliary dilatation.  Splenic vein, main portal vein, portal venous confluence all appear patent.  Filling defect is noted within a right portal vein branch into the anterior segment of the right hepatic lobe consistent with intrahepatic portal vein thrombosis.  Hepatic veins are patent.  Liver is enlarged.  In the left hepatic lobe medial segment, there is a hypodense cystic area measuring 4.6 x 5.1 cm which correlates with ultrasound finding.  This could represent a minimally complex hepatic cyst, versus a postoperative chronic seroma or biloma.  No surrounding inflammatory process to suggest a definite abscess.  Spleen is also enlarged.  The SMV is patent.  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  Adrenal glands and kidneys  demonstrate no acute process.  Pancreas demonstrates mild prominence of the pancreatic duct but stable. Postop changes from Whipple procedure in the pancreatic head region.  Diffuse mesenteric root strandy edema/inflammation with diffuse mildly enlarged mesenteric root lymph nodes.  This extends along the inferior margin the pancreas.  Mild pancreatitis not excluded versus mesenteric root lymphadenitis.  Postop changes from gastric bypass.  Negative for obstruction.  No free air evident.  No abdominal wall ventral hernia.  Pelvis:  Large amount retained stool throughout the colon compatible with constipation.  Hysterectomy noted.  The ovaries are normal in size.  Pelvic calcifications consistent with venous phleboliths.  Urinary bladder unremarkable.  No pelvic adenopathy, inguinal abnormality, hernia.  No acute abnormal osseous finding.  IMPRESSION: Medial segment left hepatic lobe 5 cm subcapsular cystic area noted, nonspecific.  This could represent interval development of an hepatic cyst versus chronic postoperative seroma or biloma. Abscess thought to be less likely as there are no surrounding inflammatory changes.  Extensive hepatic steatosis with intrahepatic portal vein branch thrombosis within the right hepatic lobe.  Otherwise the right, and left portal veins, portal vein confluence, main portal vein, splenic vein and mesenteric veins all remain patent.  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  Pneumobilia, status post cholecystectomy  Prior Whipple procedure and gastric bypass without evidence of recurrent small bowel obstruction  Mesenteric root strandy fluid/edema with diffuse mild mesenteric adenopathy can be seen with chronic mesenteric adenitis.  Mild pancreatitis not entirely excluded.  Large amount retained stool distally compatible with constipation  Prior hysterectomy  Original Report Authenticated By: Judie Petit. Ruel Favors, M.D.   Dg Chest  Port 1 View  05/14/2012  *RADIOLOGY REPORT*  Clinical Data: Line placement.  PORTABLE CHEST - 1 VIEW  Comparison: 02/20/2010  Findings: Right PICC line has been placed.  This crosses the midline and projects over the left side of the mediastinum.  This is concerning for arterial placement.  Recommend clinical correlation.  Heart is normal size.  Lungs are clear.  Mild elevation of the right hemidiaphragm, stable.  No acute bony abnormality.  IMPRESSION: The right PICC line crosses the  midline into the left mediastinum, concerning for arterial placement.  Critical Value/emergent results were called by telephone at the time of interpretation on 05/14/2012 at 12:30 p.m. to PICC nurse, Ron, who verbally acknowledged these results.  Original Report Authenticated By: Cyndie Chime, M.D.        Labs: Basic Metabolic Panel:  Lab 05/14/12 4098 05/13/12 1539  NA 138 134*  K 4.4 4.4  CL 105 95*  CO2 26 29  GLUCOSE 300* 406*  BUN 9 10  CREATININE 0.48* 0.47*  CALCIUM 8.3* 9.5  MG -- --  PHOS -- --   Liver Function Tests:  Lab 05/14/12 0444 05/13/12 1539  AST 37 44*  ALT 35 44*  ALKPHOS 104 129*  BILITOT 0.3 0.4  PROT 5.9* 7.2  ALBUMIN 2.6* 3.1*    Lab 05/13/12 1539  LIPASE 21  AMYLASE --    CBC:  Lab 05/14/12 0444 05/13/12 1539  WBC 3.9* 5.0  NEUTROABS -- 2.4  HGB 11.3* 12.9  HCT 33.7* 38.7  MCV 92.8 91.1  PLT 177 212   Cardiac Enzymes:  Lab 05/13/12 1539  CKTOTAL --  CKMB --  CKMBINDEX --  TROPONINI <0.30    CBG:  Lab 05/14/12 1240 05/14/12 0719 05/13/12 2321  GLUCAP 234* 223* 132*    Time coordinating discharge: Greater than 30 minutes  Signed:  GOSRANI,NIMISH C  [facility] 05/14/2012, 1:49 PM

## 2012-05-14 NOTE — Progress Notes (Signed)
Subjective: This lady was admitted yesterday with abdominal pain. She was found to have acute portal vein thrombosis with mesenteric adenitis. She has been started on anticoagulation. She has a complicated history and I had seen her approximately 3 months ago when she presented to the hospital in septic shock. At that time and transferred to [facility] as her physicians are there and she apparently underwent further surgery. She cannot remember the details of the surgery. She is also diabetic and sugars are running high.           Physical Exam: Blood pressure 87/58, pulse 78, temperature 98.2 F (36.8 C), temperature source Oral, resp. rate 16, height 5\' 4"  (1.626 m), weight 55.6 kg (122 lb 9.2 oz), SpO2 96.00%. She does look systemically well despite the soft blood pressure. She is not clinically shock at the present time. Heart sounds are present and normal. Lung fields are clear. Abdomen is soft and mildly tender more on the right upper abdomen. Bowel sounds are heard. She is alert and orientated without any focal neurologic signs.   Investigations:  No results found for this or any previous visit (from the past 240 hour(s)).   Basic Metabolic Panel:  Basename 05/14/12 0444 05/13/12 1539  NA 138 134*  K 4.4 4.4  CL 105 95*  CO2 26 29  GLUCOSE 300* 406*  BUN 9 10  CREATININE 0.48* 0.47*  CALCIUM 8.3* 9.5  MG -- --  PHOS -- --   Liver Function Tests:  [facility] 05/14/12 0444 05/13/12 1539  AST 37 44*  ALT 35 44*  ALKPHOS 104 129*  BILITOT 0.3 0.4  PROT 5.9* 7.2  ALBUMIN 2.6* 3.1*     CBC:  Basename 05/14/12 0444 05/13/12 1539  WBC 3.9* 5.0  NEUTROABS -- 2.4  HGB 11.3* 12.9  HCT 33.7* 38.7  MCV 92.8 91.1  PLT 177 212    US Abdomen Complete  05/13/2012  *RADIOLOGY REPORT*  Clinical Data:  Right upper abdominal and epigastric pain. Previous cholecystectomy, Whipple.  COMPLETE ABDOMINAL ULTRASOUND  Comparison:  CT 02/24/2011  Findings:  Gallbladder:   Surgically absent  Common bile duct:  Dilated, 11.1 mm diameter.  Liver:  Echogenic parenchyma.  There is a 5 x 6 x 6.1 cm complex cystic lesion   at the anterior aspect of the left lobe.  There is suspicion of thrombosis of the right portal vein.  IVC:  Appears normal.  Pancreas:  Not visualized  Spleen:  8.6 cm craniocaudal length, unremarkable  Right Kidney:  10.9 cm. No hydronephrosis.  Well-preserved cortex. Normal size and parenchymal echotexture without focal abnormalities.  Left Kidney:  11.3 cm. No hydronephrosis.  Well-preserved cortex. Normal size and parenchymal echotexture without focal abnormalities.  Abdominal aorta:  No aneurysm identified.  IMPRESSION:  1.  Possible thrombosis of the right portal vein. 2.  Nonspecific 6.1 cm complex cystic process at the anterior margin of the left hepatic lobe, unclear whether this represents an intrinsic liver lesion versus adjacent fluid collection or conceivably anatomic change related to the previous surgery.  If symptoms persist, CT with contrast may be useful for further evaluation.  Original Report Authenticated By: Osa Craver, M.D.   Ct Abdomen Pelvis W Contrast  05/13/2012  *RADIOLOGY REPORT*  Clinical Data: Right quadrant abdominal pain history of Whipple procedure, gastric bypass, cholecystectomy  CT ABDOMEN AND PELVIS WITH CONTRAST  Technique:  Multidetector CT imaging of the abdomen and pelvis was performed following the standard protocol during  bolus administration of intravenous contrast.  Contrast: OMNIPAQUE IOHEXOL 300 MG/ML  SOLN  Comparison: 05/13/2012 ultrasound, 02/24/2011 CT, 06/16/2010 CT  Findings: Normal heart size.  No pericardial effusion.  A trace right pleural effusion.  Bibasilar atelectasis evident.  No hiatal hernia.  Abdomen:  Extensive hypoattenuation of the liver parenchyma compatible with background hepatic steatosis or fatty infiltration. Pneumobilia evident.  The patient is status post cholecystectomy,  Whipple procedure, and gastric bypass.  No biliary dilatation.  Splenic vein, main portal vein, portal venous confluence all appear patent.  Filling defect is noted within a right portal vein branch into the anterior segment of the right hepatic lobe consistent with intrahepatic portal vein thrombosis.  Hepatic veins are patent.  Liver is enlarged.  In the left hepatic lobe medial segment, there is a hypodense cystic area measuring 4.6 x 5.1 cm which correlates with ultrasound finding.  This could represent a minimally complex hepatic cyst, versus a postoperative chronic seroma or biloma.  No surrounding inflammatory process to suggest a definite abscess.  Spleen is also enlarged.  The SMV is patent.  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  Adrenal glands and kidneys demonstrate no acute process.  Pancreas demonstrates mild prominence of the pancreatic duct but stable. Postop changes from Whipple procedure in the pancreatic head region.  Diffuse mesenteric root strandy edema/inflammation with diffuse mildly enlarged mesenteric root lymph nodes.  This extends along the inferior margin the pancreas.  Mild pancreatitis not excluded versus mesenteric root lymphadenitis.  Postop changes from gastric bypass.  Negative for obstruction.  No free air evident.  No abdominal wall ventral hernia.  Pelvis:  Large amount retained stool throughout the colon compatible with constipation.  Hysterectomy noted.  The ovaries are normal in size.  Pelvic calcifications consistent with venous phleboliths.  Urinary bladder unremarkable.  No pelvic adenopathy, inguinal abnormality, hernia.  No acute abnormal osseous finding.  IMPRESSION: Medial segment left hepatic lobe 5 cm subcapsular cystic area noted, nonspecific.  This could represent interval development of an hepatic cyst versus chronic postoperative seroma or biloma. Abscess thought to  be less likely as there are no surrounding inflammatory changes.  Extensive hepatic steatosis with intrahepatic portal vein branch thrombosis within the right hepatic lobe.  Otherwise the right, and left portal veins, portal vein confluence, main portal vein, splenic vein and mesenteric veins all remain patent.  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  Pneumobilia, status post cholecystectomy  Prior Whipple procedure and gastric bypass without evidence of recurrent small bowel obstruction  Mesenteric root strandy fluid/edema with diffuse mild mesenteric adenopathy can be seen with chronic mesenteric adenitis.  Mild pancreatitis not entirely excluded.  Large amount retained stool distally compatible with constipation  Prior hysterectomy  Original Report Authenticated By: Judie Petit. Ruel Favors, M.D.      Medications: I have reviewed the patient's current medications.  Impression: 1. Acute portal vein thrombosis and mesenteric adenitis. 2. Status post Whipple's procedure at [facility] in 2011, status post further surgery in May 2013 for ruptured intra-abdominal viscus.     Plan: 1. Continue with anticoagulation, I ask pharmacy to commence warfarin in addition to Lovenox. 2. Gastroenterology consultation to see if there are any further recommendations at this point.     LOS: 1 day   Wilson Singer Pager [phone]  05/14/2012, 9:01 AM

## 2012-05-14 NOTE — Progress Notes (Signed)
ANTICOAGULATION CONSULT NOTE - Initial Consult  Pharmacy Consult for Lovenox & Warfarin Indication: Portal Vein Thrombosis  Allergies  Allergen Reactions  . Metoclopramide Hcl     REGLAN REACTION: "lock-jaw"    Patient Measurements: Height: 5\' 4"  (162.6 cm) Weight: 122 lb 9.2 oz (55.6 kg) IBW/kg (Calculated) : 54.7   Vital Signs: Temp: 98.2 F (36.8 C) (08/07 0522) Temp src: Oral (08/07 0522) BP: 87/58 mmHg (08/07 0522) Pulse Rate: 78  (08/07 0522)  Labs:  Basename 05/14/12 0444 05/13/12 1539  HGB 11.3* 12.9  HCT 33.7* 38.7  PLT 177 212  APTT -- --  LABPROT 14.8 --  INR 1.14 --  HEPARINUNFRC -- --  CREATININE 0.48* 0.47*  CKTOTAL -- --  CKMB -- --  TROPONINI -- <0.30    Estimated Creatinine Clearance: 80.7 ml/min (by C-G formula based on Cr of 0.48).   Medical History: Past Medical History  Diagnosis Date  . Pancreatitis   . Diabetes mellitus     Medications:  Scheduled:    . docusate sodium  100 mg Oral BID  . enoxaparin (LOVENOX) injection  1 mg/kg Subcutaneous Q12H  . gabapentin  800 mg Oral Q8H  .  HYDROmorphone (DILAUDID) injection  1 mg Intravenous Once  .  HYDROmorphone (DILAUDID) injection  1 mg Intravenous Once  . HYDROmorphone  2 mg Intravenous Once  . HYDROmorphone  2 mg Intravenous Once  . insulin aspart  0-15 Units Subcutaneous TID WC  . insulin aspart  0-5 Units Subcutaneous QHS  . insulin aspart  5 Units Subcutaneous Once  . insulin glargine  10 Units Subcutaneous QHS  . ketorolac  30 mg Intravenous Once  . ondansetron  4 mg Intravenous Once  . ondansetron  4 mg Intravenous Once  . pantoprazole (PROTONIX) IV  40 mg Intravenous Q12H  . sodium chloride  1,000 mL Intravenous Once  . sodium chloride      . temazepam  15 mg Oral QHS  . DISCONTD: sodium chloride   Intravenous STAT  . DISCONTD: gabapentin  800 mg Oral Q8H  . DISCONTD: insulin glargine  5 Units Subcutaneous QHS    Assessment: Okay for Protocol  Goal of Therapy:    Anti-Xa level 0.6-1.2 units/ml 4hrs after LMWH dose given Monitor platelets by anticoagulation protocol: Yes   Plan:  Lovenox 1mg /kg SQ every 12 hours. CBC MWF PT/INR daily. Warfarin 7.5mg  PO x 1 today.  The patient is receiving Protonix by the intravenous route.  Based on criteria approved by the Pharmacy and Therapeutics Committee and the Medical Executive Committee, the medication is being converted to the equivalent oral dose form.  These criteria include: -No Active GI bleeding -Able to tolerate diet of full liquids (or better) or tube feeding OR able to tolerate other medications by the oral or enteral route  If you have any questions about this conversion, please contact the Pharmacy Department (ext [phone]).  Thank you.  Mady Gemma, [facility] 05/14/2012 9:10 AM    Mady Gemma 05/14/2012,9:06 AM

## 2012-05-14 NOTE — Progress Notes (Signed)
Report called to D. Tacas RN at [facility] aware of no IV access. Pt. Transferred to [facility] via carelink.

## 2012-05-14 NOTE — Care Management Note (Signed)
    Page 1 of 1   05/14/2012     2:40:55 PM   CARE MANAGEMENT NOTE 05/14/2012  Patient:  MITSUKO, LUERA R   Account Number:  [id number]  Date Initiated:  05/14/2012  Documentation initiated by:  Rosemary Holms  Subjective/Objective Assessment:   Pt admitted with Portal Vein Thrombosis. Previously hospitalized at [facility] and requesting transfer.     Action/Plan:   Transfer to [facility]   Anticipated DC Date:  05/14/2012   Anticipated DC Plan:  ACUTE TO ACUTE TRANS         Choice offered to / List presented to:             Status of service:  Completed, signed off Medicare Important Message given?   (If response is "NO", the following Medicare IM given date fields will be blank) Date Medicare IM given:   Date Additional Medicare IM given:    Discharge Disposition:  ACUTE TO ACUTE TRANS  Per UR Regulation:    If discussed at Long Length of Stay Meetings, dates discussed:    Comments:  05/14/12 Rosemary Holms RN BSN CM

## 2012-06-13 DIAGNOSIS — E139 Other specified diabetes mellitus without complications: Secondary | ICD-10-CM | POA: Diagnosis not present

## 2012-06-13 DIAGNOSIS — K432 Incisional hernia without obstruction or gangrene: Secondary | ICD-10-CM | POA: Diagnosis not present

## 2012-06-13 DIAGNOSIS — K861 Other chronic pancreatitis: Secondary | ICD-10-CM | POA: Diagnosis not present

## 2012-06-13 DIAGNOSIS — K7689 Other specified diseases of liver: Secondary | ICD-10-CM | POA: Diagnosis not present

## 2012-06-23 DIAGNOSIS — Z794 Long term (current) use of insulin: Secondary | ICD-10-CM | POA: Diagnosis not present

## 2012-06-23 DIAGNOSIS — E119 Type 2 diabetes mellitus without complications: Secondary | ICD-10-CM | POA: Diagnosis not present

## 2012-06-23 DIAGNOSIS — Z79899 Other long term (current) drug therapy: Secondary | ICD-10-CM | POA: Diagnosis not present

## 2012-06-23 DIAGNOSIS — Z5331 Laparoscopic surgical procedure converted to open procedure: Secondary | ICD-10-CM | POA: Diagnosis not present

## 2012-06-23 DIAGNOSIS — K432 Incisional hernia without obstruction or gangrene: Secondary | ICD-10-CM | POA: Diagnosis not present

## 2012-06-23 DIAGNOSIS — K439 Ventral hernia without obstruction or gangrene: Secondary | ICD-10-CM | POA: Diagnosis not present

## 2012-06-23 DIAGNOSIS — K7689 Other specified diseases of liver: Secondary | ICD-10-CM | POA: Diagnosis not present

## 2012-06-24 DIAGNOSIS — Z79899 Other long term (current) drug therapy: Secondary | ICD-10-CM | POA: Diagnosis not present

## 2012-06-24 DIAGNOSIS — Z794 Long term (current) use of insulin: Secondary | ICD-10-CM | POA: Diagnosis not present

## 2012-06-24 DIAGNOSIS — E119 Type 2 diabetes mellitus without complications: Secondary | ICD-10-CM | POA: Diagnosis not present

## 2012-06-24 DIAGNOSIS — Z5331 Laparoscopic surgical procedure converted to open procedure: Secondary | ICD-10-CM | POA: Diagnosis not present

## 2012-06-24 DIAGNOSIS — K432 Incisional hernia without obstruction or gangrene: Secondary | ICD-10-CM | POA: Diagnosis not present

## 2012-06-24 DIAGNOSIS — K7689 Other specified diseases of liver: Secondary | ICD-10-CM | POA: Diagnosis not present

## 2012-06-25 DIAGNOSIS — Z794 Long term (current) use of insulin: Secondary | ICD-10-CM | POA: Diagnosis not present

## 2012-06-25 DIAGNOSIS — E119 Type 2 diabetes mellitus without complications: Secondary | ICD-10-CM | POA: Diagnosis not present

## 2012-06-25 DIAGNOSIS — K7689 Other specified diseases of liver: Secondary | ICD-10-CM | POA: Diagnosis not present

## 2012-06-25 DIAGNOSIS — K432 Incisional hernia without obstruction or gangrene: Secondary | ICD-10-CM | POA: Diagnosis not present

## 2012-06-25 DIAGNOSIS — Z5331 Laparoscopic surgical procedure converted to open procedure: Secondary | ICD-10-CM | POA: Diagnosis not present

## 2012-06-25 DIAGNOSIS — Z79899 Other long term (current) drug therapy: Secondary | ICD-10-CM | POA: Diagnosis not present

## 2012-07-24 DIAGNOSIS — K861 Other chronic pancreatitis: Secondary | ICD-10-CM | POA: Diagnosis not present

## 2012-07-24 DIAGNOSIS — Z79899 Other long term (current) drug therapy: Secondary | ICD-10-CM | POA: Diagnosis not present

## 2012-08-12 DIAGNOSIS — Z9889 Other specified postprocedural states: Secondary | ICD-10-CM | POA: Diagnosis not present

## 2012-08-28 DIAGNOSIS — R109 Unspecified abdominal pain: Secondary | ICD-10-CM | POA: Diagnosis not present

## 2012-08-28 DIAGNOSIS — K861 Other chronic pancreatitis: Secondary | ICD-10-CM | POA: Diagnosis not present

## 2012-10-16 DIAGNOSIS — K861 Other chronic pancreatitis: Secondary | ICD-10-CM | POA: Diagnosis not present

## 2012-10-16 DIAGNOSIS — R110 Nausea: Secondary | ICD-10-CM | POA: Diagnosis not present

## 2012-10-16 DIAGNOSIS — R109 Unspecified abdominal pain: Secondary | ICD-10-CM | POA: Diagnosis not present

## 2012-10-27 DIAGNOSIS — R109 Unspecified abdominal pain: Secondary | ICD-10-CM | POA: Diagnosis not present

## 2012-10-27 DIAGNOSIS — Z8719 Personal history of other diseases of the digestive system: Secondary | ICD-10-CM | POA: Diagnosis not present

## 2012-11-03 DIAGNOSIS — M546 Pain in thoracic spine: Secondary | ICD-10-CM | POA: Diagnosis not present

## 2012-11-03 DIAGNOSIS — IMO0001 Reserved for inherently not codable concepts without codable children: Secondary | ICD-10-CM | POA: Diagnosis not present

## 2012-12-25 ENCOUNTER — Emergency Department (HOSPITAL_COMMUNITY)
Admission: EM | Admit: 2012-12-25 | Discharge: 2012-12-25 | Disposition: A | Discharge status: Home / Self Care | Payer: Medicare Other | Attending: Emergency Medicine | Admitting: Emergency Medicine

## 2012-12-25 ENCOUNTER — Encounter: Payer: Self-pay | Admitting: Nurse Practitioner

## 2012-12-25 ENCOUNTER — Emergency Department (HOSPITAL_COMMUNITY): Payer: Medicare Other

## 2012-12-25 ENCOUNTER — Telehealth: Payer: Self-pay | Admitting: Nurse Practitioner

## 2012-12-25 ENCOUNTER — Ambulatory Visit (INDEPENDENT_AMBULATORY_CARE_PROVIDER_SITE_OTHER): Payer: Medicare Other | Admitting: Nurse Practitioner

## 2012-12-25 ENCOUNTER — Encounter (HOSPITAL_COMMUNITY): Payer: Self-pay | Admitting: *Deleted

## 2012-12-25 VITALS — BP 90/62 | HR 80 | Wt 106.6 lb

## 2012-12-25 DIAGNOSIS — Z794 Long term (current) use of insulin: Secondary | ICD-10-CM | POA: Diagnosis not present

## 2012-12-25 DIAGNOSIS — R109 Unspecified abdominal pain: Secondary | ICD-10-CM | POA: Insufficient documentation

## 2012-12-25 DIAGNOSIS — M549 Dorsalgia, unspecified: Secondary | ICD-10-CM | POA: Insufficient documentation

## 2012-12-25 DIAGNOSIS — Z79899 Other long term (current) drug therapy: Secondary | ICD-10-CM | POA: Diagnosis not present

## 2012-12-25 DIAGNOSIS — Z5189 Encounter for other specified aftercare: Secondary | ICD-10-CM | POA: Diagnosis not present

## 2012-12-25 DIAGNOSIS — R739 Hyperglycemia, unspecified: Secondary | ICD-10-CM

## 2012-12-25 DIAGNOSIS — J3489 Other specified disorders of nose and nasal sinuses: Secondary | ICD-10-CM | POA: Diagnosis not present

## 2012-12-25 DIAGNOSIS — R112 Nausea with vomiting, unspecified: Secondary | ICD-10-CM | POA: Insufficient documentation

## 2012-12-25 DIAGNOSIS — K859 Acute pancreatitis without necrosis or infection, unspecified: Secondary | ICD-10-CM | POA: Diagnosis not present

## 2012-12-25 DIAGNOSIS — K861 Other chronic pancreatitis: Secondary | ICD-10-CM | POA: Diagnosis not present

## 2012-12-25 DIAGNOSIS — Z9889 Other specified postprocedural states: Secondary | ICD-10-CM | POA: Insufficient documentation

## 2012-12-25 DIAGNOSIS — E1169 Type 2 diabetes mellitus with other specified complication: Secondary | ICD-10-CM | POA: Diagnosis not present

## 2012-12-25 DIAGNOSIS — Z90710 Acquired absence of both cervix and uterus: Secondary | ICD-10-CM | POA: Insufficient documentation

## 2012-12-25 DIAGNOSIS — R51 Headache: Secondary | ICD-10-CM | POA: Insufficient documentation

## 2012-12-25 DIAGNOSIS — K5900 Constipation, unspecified: Secondary | ICD-10-CM | POA: Insufficient documentation

## 2012-12-25 DIAGNOSIS — R52 Pain, unspecified: Secondary | ICD-10-CM

## 2012-12-25 DIAGNOSIS — K7689 Other specified diseases of liver: Secondary | ICD-10-CM | POA: Diagnosis not present

## 2012-12-25 DIAGNOSIS — E119 Type 2 diabetes mellitus without complications: Secondary | ICD-10-CM | POA: Diagnosis not present

## 2012-12-25 DIAGNOSIS — Z1211 Encounter for screening for malignant neoplasm of colon: Secondary | ICD-10-CM | POA: Insufficient documentation

## 2012-12-25 LAB — COMPREHENSIVE METABOLIC PANEL
ALT: 59 U/L — ABNORMAL HIGH (ref 0–35)
BUN: 13 mg/dL (ref 6–23)
CO2: 29 mEq/L (ref 19–32)
Calcium: 9.3 mg/dL (ref 8.4–10.5)
GFR calc Af Amer: >90 mL/min (ref >90)
GFR calc non Af Amer: >90 mL/min (ref >90)
Glucose, Bld: 324 mg/dL — ABNORMAL HIGH (ref 70–99)
Sodium: 134 mEq/L — ABNORMAL LOW (ref 135–145)
Total Protein: 7.5 g/dL (ref 6.0–8.3)

## 2012-12-25 LAB — URINALYSIS, ROUTINE W REFLEX MICROSCOPIC
Bilirubin Urine: NEGATIVE
Hgb urine dipstick: NEGATIVE
Nitrite: NEGATIVE
Protein, ur: NEGATIVE mg/dL
Urobilinogen, UA: 0.2 mg/dL (ref 0.0–1.0)

## 2012-12-25 LAB — CBC WITH DIFFERENTIAL/PLATELET
Basophils Absolute: 0 10*3/uL (ref 0.0–0.1)
Eosinophils Absolute: 0 10*3/uL (ref 0.0–0.7)
Eosinophils Relative: 0 % (ref 0–5)
MCH: 32.9 pg (ref 26.0–34.0)
MCV: 93.5 fL (ref 78.0–100.0)
Neutrophils Relative %: 49 % (ref 43–77)
Platelets: 184 10*3/uL (ref 150–400)
RDW: 13.1 % (ref 11.5–15.5)
WBC: 5.3 10*3/uL (ref 4.0–10.5)

## 2012-12-25 LAB — LIPASE, BLOOD: Lipase: 21 U/L (ref 11–59)

## 2012-12-25 MED ORDER — PROMETHAZINE HCL 25 MG PO TABS: 25.0000 mg | ORAL_TABLET | Freq: Four times a day (QID) | ORAL | Status: DC | PRN

## 2012-12-25 MED ORDER — IOHEXOL 300 MG/ML  SOLN
50.0000 mL | Freq: Once | INTRAMUSCULAR | Status: AC | PRN
  Administered 2012-12-25: 50 mL via ORAL

## 2012-12-25 MED ORDER — HYDROMORPHONE HCL PF 1 MG/ML IJ SOLN
1.0000 mg | Freq: Once | INTRAMUSCULAR | Status: AC
  Administered 2012-12-25: 1 mg via INTRAVENOUS
  Filled 2012-12-25: qty 1

## 2012-12-25 MED ORDER — IOHEXOL 300 MG/ML  SOLN
100.0000 mL | Freq: Once | INTRAMUSCULAR | Status: AC | PRN
  Administered 2012-12-25: 100 mL via INTRAVENOUS

## 2012-12-25 MED ORDER — DOCUSATE SODIUM 100 MG PO CAPS: 100.0000 mg | ORAL_CAPSULE | Freq: Two times a day (BID) | ORAL | Status: DC

## 2012-12-25 MED ORDER — SODIUM CHLORIDE 0.9 % IV SOLN
INTRAVENOUS | Status: DC
  Administered 2012-12-25: 15:00:00 via INTRAVENOUS

## 2012-12-25 MED ORDER — SODIUM CHLORIDE 0.9 % IV BOLUS (SEPSIS)
500.0000 mL | Freq: Once | INTRAVENOUS | Status: AC
  Administered 2012-12-25: 500 mL via INTRAVENOUS

## 2012-12-25 MED ORDER — INSULIN GLARGINE 100 UNIT/ML ~~LOC~~ SOLN: 10.0000 [IU] | Freq: Every day | SUBCUTANEOUS | Status: DC

## 2012-12-25 MED ORDER — SODIUM CHLORIDE 0.9 % IV SOLN: INTRAVENOUS | Status: DC

## 2012-12-25 MED ORDER — PROMETHAZINE HCL 25 MG/ML IJ SOLN
12.5000 mg | Freq: Once | INTRAMUSCULAR | Status: AC
  Administered 2012-12-25: 12.5 mg via INTRAVENOUS
  Filled 2012-12-25: qty 1

## 2012-12-25 MED ORDER — ONDANSETRON HCL 4 MG/2ML IJ SOLN
4.0000 mg | Freq: Once | INTRAMUSCULAR | Status: AC
  Administered 2012-12-25: 4 mg via INTRAVENOUS
  Filled 2012-12-25: qty 2

## 2012-12-25 MED ORDER — HYDROCODONE-ACETAMINOPHEN 5-325 MG PO TABS: 1.0000 | ORAL_TABLET | Freq: Four times a day (QID) | ORAL | Status: DC | PRN

## 2012-12-25 NOTE — Telephone Encounter (Signed)
Needs prescription for needles that go with the Insulin pen.  Please call into [facility]  Call Patient when completed

## 2012-12-25 NOTE — ED Provider Notes (Signed)
History     This chart was scribed for Shelda Jakes, MD, MD by Smitty Pluck, ED Scribe. The patient was seen in room APA03/APA03 and the patient's care was started at 11:36 AM.   CSN: [id number]  Arrival date & time 12/25/12  1043     Chief Complaint  Patient presents with  . Pancreatitis    Patient is a 42 y.o. female presenting with abdominal pain. The history is provided by the patient. No language interpreter was used.  Abdominal Pain Pain location:  Epigastric Pain radiates to:  Back Pain severity:  Severe Onset quality:  Sudden Duration:  1 day Timing:  Constant Progression:  Unchanged Chronicity:  Recurrent Associated symptoms: nausea and vomiting   Associated symptoms: no chest pain, no chills, no cough, no diarrhea, no dysuria, no fever and no shortness of breath    [name] [name] is a 42 y.o. female who presents to the Emergency Department complaining of constant, moderate severe epigastric abdominal pain radiating to back onset 1 day ago. Pain is 10/10. She reports pain feels similar to past episodes of pancreatitis (August 2013). She reports that she was seen by Dr. Gerda Diss (PCP) for same symptoms and was referred here today.  She has vomited 4x  since onset of pain. She mentions taking dilaudid at home without relief of pain. She reports having rhinorrhea onset 3 days ago. She states that she has had a whipple procedure last year. Pt denies fever, chills, nausea, vomiting, weakness, cough, SOB and any other pain.   Past Medical History  Diagnosis Date  . Pancreatitis   . Diabetes mellitus     Past Surgical History  Procedure Laterality Date  . Whipple procedure    . Intestinal blockage    . Abdominal surgery    . Abdominal hysterectomy      History reviewed. No pertinent family history.  History  Substance Use Topics  . Smoking status: Never Smoker   . Smokeless tobacco: Not on file  . Alcohol Use: No    OB History   Grav Para Term Preterm  Abortions TAB SAB Ect Mult Living                  Review of Systems  Constitutional: Negative for fever and chills.  HENT: Positive for rhinorrhea. Negative for neck pain.   Eyes: Negative for visual disturbance.  Respiratory: Negative for cough and shortness of breath.   Cardiovascular: Negative for chest pain.  Gastrointestinal: Positive for nausea, vomiting and abdominal pain. Negative for diarrhea.  Genitourinary: Negative for dysuria.  Musculoskeletal: Positive for back pain.  Skin: Negative for rash.  Neurological: Positive for headaches.  Hematological: Does not bruise/bleed easily.  Psychiatric/Behavioral: Negative for confusion.  All other systems reviewed and are negative.    Allergies  Metoclopramide hcl  Home Medications   Current Outpatient Rx  Name  Route  Sig  Dispense  Refill  . dronabinol (MARINOL) 5 MG capsule   Oral   Take 5 mg by mouth 4 (four) times daily as needed (for appetite).          . DULoxetine (CYMBALTA) 30 MG capsule   Oral   Take 30 mg by mouth 2 (two) times daily.          . fentaNYL (DURAGESIC - DOSED MCG/HR) 25 MCG/HR   Transdermal   Place 1 patch onto the skin every 3 (three) days.         Marland Kitchen gabapentin (NEURONTIN) 800  MG tablet   Oral   Take 800 mg by mouth every 8 (eight) hours.         Marland Kitchen HYDROmorphone (DILAUDID) 4 MG tablet   Oral   Take 4 mg by mouth every 6 (six) hours as needed for pain.         Marland Kitchen insulin aspart (NOVOLOG) 100 UNIT/ML injection   Subcutaneous   Inject 0-5 Units into the skin at bedtime.   1 vial      . insulin glargine (LANTUS) 100 UNIT/ML injection   Subcutaneous   Inject 10 Units into the skin at bedtime.         Marland Kitchen omeprazole (PRILOSEC) 20 MG capsule   Oral   Take 20 mg by mouth 2 (two) times daily.         . ondansetron (ZOFRAN) 4 MG tablet   Oral   Take 4 mg by mouth every 8 (eight) hours as needed for nausea.         . promethazine (PHENERGAN) 25 MG tablet   Oral   Take  1 tablet (25 mg total) by mouth every 6 (six) hours as needed for nausea. Do not take with Zofran   30 tablet   0   . docusate sodium (COLACE) 100 MG capsule   Oral   Take 1 capsule (100 mg total) by mouth every 12 (twelve) hours.   60 capsule   0   . HYDROcodone-acetaminophen (NORCO/VICODIN) 5-325 MG per tablet   Oral   Take 1-2 tablets by mouth every 6 (six) hours as needed for pain.   15 tablet   0   . promethazine (PHENERGAN) 25 MG tablet   Oral   Take 1 tablet (25 mg total) by mouth every 6 (six) hours as needed for nausea.   12 tablet   0     BP 119/88  Pulse 125  Temp(Src) 98.3 F (36.8 C) (Oral)  Resp 20  Ht 5\' 3"  (1.6 m)  Wt 106 lb (48.081 kg)  BMI 18.78 kg/m2  SpO2 98%  Physical Exam  Nursing note and vitals reviewed. Constitutional: She is oriented to person, place, and time. She appears well-developed and well-nourished. No distress.  HENT:  Head: Normocephalic and atraumatic.  Eyes: Conjunctivae and EOM are normal. Pupils are equal, round, and reactive to light.  Neck: Normal range of motion. Neck supple.  Cardiovascular: Normal rate, regular rhythm and normal heart sounds.   No murmur heard. Pulmonary/Chest: Effort normal and breath sounds normal. No respiratory distress. She has no wheezes. She has no rales.  Abdominal: Soft. She exhibits no distension. There is tenderness in the right upper quadrant and epigastric area. There is no rebound and no guarding.  Neurological: She is alert and oriented to person, place, and time. No cranial nerve deficit.  Skin: Skin is warm and dry.  Psychiatric: She has a normal mood and affect. Her behavior is normal.    ED Course  Procedures (including critical care time) DIAGNOSTIC STUDIES: Oxygen Saturation is 98% on room air, normal by my interpretation.    COORDINATION OF CARE: 11:41 AM Discussed ED treatment with pt and pt agrees.  12:00 PM Ordered:  Medications  0.9 %  sodium chloride infusion (  Intravenous New Bag/Given 12/25/12 1439)  HYDROmorphone (DILAUDID) injection 1 mg (not administered)  promethazine (PHENERGAN) injection 12.5 mg (not administered)  sodium chloride 0.9 % bolus 500 mL (0 mLs Intravenous Stopped 12/25/12 1408)  HYDROmorphone (DILAUDID) injection 1 mg (1  mg Intravenous Given 12/25/12 1224)  ondansetron (ZOFRAN) injection 4 mg (4 mg Intravenous Given 12/25/12 1224)  iohexol (OMNIPAQUE) 300 MG/ML solution 50 mL (50 mLs Oral Contrast Given 12/25/12 1231)  HYDROmorphone (DILAUDID) injection 1 mg (1 mg Intravenous Given 12/25/12 1337)  iohexol (OMNIPAQUE) 300 MG/ML solution 100 mL (100 mLs Intravenous Contrast Given 12/25/12 1405)     12:51 PM Recheck: Pt still has pain will order more pain medication.    Labs Reviewed  COMPREHENSIVE METABOLIC PANEL - Abnormal; Notable for the following:    Sodium 134 (*)    Glucose, Bld 324 (*)    Creatinine, Ser 0.43 (*)    Albumin 3.4 (*)    AST 68 (*)    ALT 59 (*)    Alkaline Phosphatase 137 (*)    All other components within normal limits  URINALYSIS, ROUTINE W REFLEX MICROSCOPIC - Abnormal; Notable for the following:    Specific Gravity, Urine >1.030 (*)    Glucose, UA 250 (*)    All other components within normal limits  CBC WITH DIFFERENTIAL - Abnormal; Notable for the following:    Lymphocytes Relative 47 (*)    All other components within normal limits  LIPASE, BLOOD   Ct Abdomen Pelvis W Contrast  12/25/2012  *RADIOLOGY REPORT*  Clinical Data: Upper abdominal pain.  History of pancreatitis. History of a Whipple procedure.  CT ABDOMEN AND PELVIS WITH CONTRAST  Technique:  Multidetector CT imaging of the abdomen and pelvis was performed following the standard protocol during bolus administration of intravenous contrast.  Contrast: 50mL OMNIPAQUE IOHEXOL 300 MG/ML  SOLN, OMNIPAQUE IOHEXOL 300 MG/ML  SOLN  Comparison: 05/13/2012.  Findings: The lung bases are clear.  No pleural effusion or pulmonary nodule.  No  pericardial effusion.  The distal esophagus is unremarkable.  The liver demonstrates fairly marked and diffuse fatty infiltration.  Stable hepatic cyst.  No worrisome hepatic lesions. The spleen is within normal limits in size.  No focal splenic lesions.  The remainder of the pancreas appears stable.  Persistent mild pancreatic ductal dilatation of the tail.  I do not see any evidence of pancreatitis involving the residual pancreatic tissue.  Stable surgical changes from a Whipple procedure.  No complicating features are demonstrated.  There is a persistent area of interstitial change in the mesentery just inferior to the pancreas and surrounding the celiac axis vessels.  There are multiple borderline enlarged lymph nodes which have not changed.  No focal abscess.  The stomach, small bowel and colon unremarkable except for a a large amount of stool throughout the descending colon suggesting constipation.  The aorta is normal in caliber.  The major branch vessels are patent.  The portal and hepatic veins are patent.  The uterus is surgically absent.  No pelvic mass, adenopathy or free pelvic fluid collections.  The bladder is normal.  No inguinal mass or hernia.  The bony structures are intact.  IMPRESSION:  1.  Stable surgical changes in the upper abdomen.  No complicating features are identified. 2.  Chronic area of inflammation, interstitial change and borderline lymph nodes in the small bowel mesentery. 3.  Diffuse fatty infiltration of the liver. 4.  Large amount of stool throughout a distended colon suggesting constipation.   Original Report Authenticated By: Rudie Meyer, M.D.    Results for orders placed during the hospital encounter of 12/25/12  COMPREHENSIVE METABOLIC PANEL      Result Value Range   Sodium 134 (*) 135 - 145  mEq/L   Potassium 4.2  3.5 - 5.1 mEq/L   Chloride 96  96 - 112 mEq/L   CO2 29  19 - 32 mEq/L   Glucose, Bld 324 (*) 70 - 99 mg/dL   BUN 13  6 - 23 mg/dL   Creatinine, Ser 1.61  (*) 0.50 - 1.10 mg/dL   Calcium 9.3  8.4 - 09.6 mg/dL   Total Protein 7.5  6.0 - 8.3 g/dL   Albumin 3.4 (*) 3.5 - 5.2 g/dL   AST 68 (*) 0 - 37 U/L   ALT 59 (*) 0 - 35 U/L   Alkaline Phosphatase 137 (*) 39 - 117 U/L   Total Bilirubin 0.5  0.3 - 1.2 mg/dL   GFR calc non Af Amer >90  >90 mL/min   GFR calc Af Amer >90  >90 mL/min  LIPASE, BLOOD      Result Value Range   Lipase 21  11 - 59 U/L  URINALYSIS, ROUTINE W REFLEX MICROSCOPIC      Result Value Range   Color, Urine YELLOW  YELLOW   APPearance CLEAR  CLEAR   Specific Gravity, Urine >1.030 (*) 1.005 - 1.030   pH 6.0  5.0 - 8.0   Glucose, UA 250 (*) NEGATIVE mg/dL   Hgb urine dipstick NEGATIVE  NEGATIVE   Bilirubin Urine NEGATIVE  NEGATIVE   Ketones, ur NEGATIVE  NEGATIVE mg/dL   Protein, ur NEGATIVE  NEGATIVE mg/dL   Urobilinogen, UA 0.2  0.0 - 1.0 mg/dL   Nitrite NEGATIVE  NEGATIVE   Leukocytes, UA NEGATIVE  NEGATIVE  CBC WITH DIFFERENTIAL      Result Value Range   WBC 5.3  4.0 - 10.5 K/uL   RBC 4.34  3.87 - 5.11 MIL/uL   Hemoglobin 14.3  12.0 - 15.0 g/dL   HCT 04.5  40.9 - 81.1 %   MCV 93.5  78.0 - 100.0 fL   MCH 32.9  26.0 - 34.0 pg   MCHC 35.2  30.0 - 36.0 g/dL   RDW 91.4  78.2 - 95.6 %   Platelets 184  150 - 400 K/uL   Neutrophils Relative 49  43 - 77 %   Neutro Abs 2.6  1.7 - 7.7 K/uL   Lymphocytes Relative 47 (*) 12 - 46 %   Lymphs Abs 2.5  0.7 - 4.0 K/uL   Monocytes Relative 4  3 - 12 %   Monocytes Absolute 0.2  0.1 - 1.0 K/uL   Eosinophils Relative 0  0 - 5 %   Eosinophils Absolute 0.0  0.0 - 0.7 K/uL   Basophils Relative 0  0 - 1 %   Basophils Absolute 0.0  0.0 - 0.1 K/uL     1. Abdominal pain   2. Hyperglycemia   3. Constipation       MDM  Patient referred in from primary care office. The abdominal pain nausea and vomiting. Patient has had pancreatitis in the past today's workup without any evidence of pancreatitis lipase is normal. No leukocytosis. CT scan negative suggestive of constipation. CT  does show an area of chronic inflammation not likely to be related to the pain. Patient blood sugar was elevated today and she will need to followup with her Dr. to have that monitored. No evidence of metabolic acidosis. For the constipation she'll be given Colace. Patient would treat with pain medicine and Phenergan.      I personally performed the services described in this documentation, which was scribed in  my presence. The recorded information has been reviewed and is accurate.       Shelda Jakes, MD 12/25/12 [phone]

## 2012-12-25 NOTE — Progress Notes (Signed)
Subjective: patient presents today for severe nausea with some vomiting and severe pain possibly a flare-up of her pancreatitis.  Currently on Dilaudid 4mg  q 6 hrs and a Fentanyl patch.  Having severe break through pain. Usually requires a hospital admission when her symptoms are this severe.  Assessment:  #1 pain management #2 vomiting # chronic pancreatitis Plan:  Discussed options with patient.  Will send to local ER for further evaluation.  Given refills of Lantus and Phenergan.

## 2012-12-25 NOTE — ED Notes (Signed)
Pt requesting more pain and nausea meds.  edp notified.  No new orders received.

## 2012-12-25 NOTE — ED Notes (Signed)
Sent from Dr Gerda Diss 's office  With abd pain , n/v,   Took dilaudid po at home  At 730 am.

## 2012-12-26 MED ORDER — "PEN NEEDLES 5/16"" 30G X 8 MM MISC": 1.0000 "pen " | Freq: Every day | Status: DC

## 2012-12-26 NOTE — Telephone Encounter (Signed)
Notified husband that needles were sent in to pharmacy.

## 2012-12-26 NOTE — Telephone Encounter (Signed)
Please call patient and let her know needles have been called in.

## 2013-01-01 DIAGNOSIS — R110 Nausea: Secondary | ICD-10-CM | POA: Diagnosis not present

## 2013-01-01 DIAGNOSIS — R1013 Epigastric pain: Secondary | ICD-10-CM | POA: Diagnosis not present

## 2013-01-08 ENCOUNTER — Emergency Department (HOSPITAL_COMMUNITY): Payer: Medicare Other

## 2013-01-08 ENCOUNTER — Encounter (HOSPITAL_COMMUNITY): Payer: Self-pay | Admitting: *Deleted

## 2013-01-08 ENCOUNTER — Inpatient Hospital Stay (HOSPITAL_COMMUNITY)
Admission: EM | Admit: 2013-01-08 | Discharge: 2013-01-09 | DRG: 086 | Disposition: A | Discharge status: Home / Self Care | Payer: Medicare Other | Attending: Internal Medicine | Admitting: Internal Medicine

## 2013-01-08 DIAGNOSIS — R52 Pain, unspecified: Secondary | ICD-10-CM

## 2013-01-08 DIAGNOSIS — S066X0A Traumatic subarachnoid hemorrhage without loss of consciousness, initial encounter: Principal | ICD-10-CM | POA: Diagnosis present

## 2013-01-08 DIAGNOSIS — E871 Hypo-osmolality and hyponatremia: Secondary | ICD-10-CM

## 2013-01-08 DIAGNOSIS — Z23 Encounter for immunization: Secondary | ICD-10-CM

## 2013-01-08 DIAGNOSIS — G8929 Other chronic pain: Secondary | ICD-10-CM | POA: Diagnosis present

## 2013-01-08 DIAGNOSIS — R739 Hyperglycemia, unspecified: Secondary | ICD-10-CM

## 2013-01-08 DIAGNOSIS — H0520 Unspecified exophthalmos: Secondary | ICD-10-CM | POA: Diagnosis present

## 2013-01-08 DIAGNOSIS — S93409A Sprain of unspecified ligament of unspecified ankle, initial encounter: Secondary | ICD-10-CM

## 2013-01-08 DIAGNOSIS — IMO0002 Reserved for concepts with insufficient information to code with codable children: Secondary | ICD-10-CM | POA: Diagnosis present

## 2013-01-08 DIAGNOSIS — S0510XA Contusion of eyeball and orbital tissues, unspecified eye, initial encounter: Secondary | ICD-10-CM | POA: Diagnosis present

## 2013-01-08 DIAGNOSIS — Z794 Long term (current) use of insulin: Secondary | ICD-10-CM

## 2013-01-08 DIAGNOSIS — T148XXA Other injury of unspecified body region, initial encounter: Secondary | ICD-10-CM | POA: Diagnosis not present

## 2013-01-08 DIAGNOSIS — I81 Portal vein thrombosis: Secondary | ICD-10-CM

## 2013-01-08 DIAGNOSIS — K5900 Constipation, unspecified: Secondary | ICD-10-CM

## 2013-01-08 DIAGNOSIS — Z8719 Personal history of other diseases of the digestive system: Secondary | ICD-10-CM

## 2013-01-08 DIAGNOSIS — S0990XA Unspecified injury of head, initial encounter: Secondary | ICD-10-CM | POA: Diagnosis not present

## 2013-01-08 DIAGNOSIS — K769 Liver disease, unspecified: Secondary | ICD-10-CM

## 2013-01-08 DIAGNOSIS — W010XXA Fall on same level from slipping, tripping and stumbling without subsequent striking against object, initial encounter: Secondary | ICD-10-CM | POA: Diagnosis present

## 2013-01-08 DIAGNOSIS — I609 Nontraumatic subarachnoid hemorrhage, unspecified: Secondary | ICD-10-CM | POA: Diagnosis not present

## 2013-01-08 DIAGNOSIS — F319 Bipolar disorder, unspecified: Secondary | ICD-10-CM

## 2013-01-08 DIAGNOSIS — K8689 Other specified diseases of pancreas: Secondary | ICD-10-CM | POA: Diagnosis present

## 2013-01-08 DIAGNOSIS — W101XXA Fall (on)(from) sidewalk curb, initial encounter: Secondary | ICD-10-CM

## 2013-01-08 DIAGNOSIS — G43909 Migraine, unspecified, not intractable, without status migrainosus: Secondary | ICD-10-CM

## 2013-01-08 DIAGNOSIS — E139 Other specified diabetes mellitus without complications: Secondary | ICD-10-CM | POA: Diagnosis present

## 2013-01-08 DIAGNOSIS — S065X9A Traumatic subdural hemorrhage with loss of consciousness of unspecified duration, initial encounter: Secondary | ICD-10-CM | POA: Diagnosis not present

## 2013-01-08 DIAGNOSIS — Z79899 Other long term (current) drug therapy: Secondary | ICD-10-CM

## 2013-01-08 DIAGNOSIS — Z87442 Personal history of urinary calculi: Secondary | ICD-10-CM

## 2013-01-08 DIAGNOSIS — E119 Type 2 diabetes mellitus without complications: Secondary | ICD-10-CM | POA: Diagnosis not present

## 2013-01-08 LAB — CBC WITH DIFFERENTIAL/PLATELET
Basophils Relative: 0 % (ref 0–1)
Eosinophils Absolute: 0 10*3/uL (ref 0.0–0.7)
Eosinophils Relative: 0 % (ref 0–5)
HCT: 38.7 % (ref 36.0–46.0)
Hemoglobin: 13.3 g/dL (ref 12.0–15.0)
Lymphs Abs: 1.8 10*3/uL (ref 0.7–4.0)
MCH: 32.4 pg (ref 26.0–34.0)
MCHC: 34.4 g/dL (ref 30.0–36.0)
MCV: 94.4 fL (ref 78.0–100.0)
Monocytes Absolute: 0.3 10*3/uL (ref 0.1–1.0)
Monocytes Relative: 4 % (ref 3–12)
RBC: 4.1 MIL/uL (ref 3.87–5.11)

## 2013-01-08 LAB — BASIC METABOLIC PANEL
BUN: 8 mg/dL (ref 6–23)
Calcium: 8.9 mg/dL (ref 8.4–10.5)
Creatinine, Ser: 0.45 mg/dL — ABNORMAL LOW (ref 0.50–1.10)
GFR calc non Af Amer: >90 mL/min (ref >90)
Glucose, Bld: 575 mg/dL (ref 70–99)
Potassium: 4.9 mEq/L (ref 3.5–5.1)

## 2013-01-08 LAB — GLUCOSE, CAPILLARY
Glucose-Capillary: 325 mg/dL — ABNORMAL HIGH (ref 70–99)
Glucose-Capillary: 231 mg/dL — ABNORMAL HIGH (ref 70–99)
Glucose-Capillary: 117 mg/dL — ABNORMAL HIGH (ref 70–99)

## 2013-01-08 LAB — PROTIME-INR: INR: 1.14 (ref 0.00–1.49)

## 2013-01-08 MED ORDER — BACITRACIN ZINC 500 UNIT/GM EX OINT
1.0000 "application " | TOPICAL_OINTMENT | Freq: Two times a day (BID) | CUTANEOUS | Status: DC
  Administered 2013-01-08 – 2013-01-09 (×3): 1 via TOPICAL
  Filled 2013-01-08: qty 15
  Filled 2013-01-08: qty 0.9

## 2013-01-08 MED ORDER — PROMETHAZINE HCL 25 MG/ML IJ SOLN
25.0000 mg | Freq: Once | INTRAMUSCULAR | Status: AC
  Administered 2013-01-08: 25 mg via INTRAMUSCULAR
  Filled 2013-01-08: qty 1

## 2013-01-08 MED ORDER — OXYCODONE-ACETAMINOPHEN 5-325 MG PO TABS
2.0000 | ORAL_TABLET | Freq: Once | ORAL | Status: AC
  Administered 2013-01-08: 2 via ORAL
  Filled 2013-01-08: qty 2

## 2013-01-08 MED ORDER — SODIUM CHLORIDE 0.9 % IV SOLN: INTRAVENOUS | Status: DC

## 2013-01-08 MED ORDER — ONDANSETRON 8 MG PO TBDP
ORAL_TABLET | ORAL | Status: AC
  Administered 2013-01-08: 8 mg via ORAL
  Filled 2013-01-08: qty 1

## 2013-01-08 MED ORDER — INSULIN REGULAR HUMAN 100 UNIT/ML IJ SOLN
INTRAMUSCULAR | Status: AC
  Filled 2013-01-08: qty 3

## 2013-01-08 MED ORDER — HYDROMORPHONE HCL PF 1 MG/ML IJ SOLN
1.0000 mg | INTRAMUSCULAR | Status: DC | PRN
  Administered 2013-01-08 – 2013-01-09 (×7): 1 mg via INTRAVENOUS
  Filled 2013-01-08 (×7): qty 1

## 2013-01-08 MED ORDER — INSULIN REGULAR HUMAN 100 UNIT/ML IJ SOLN
INTRAMUSCULAR | Status: DC
  Administered 2013-01-08: 2.7 [IU]/h via INTRAVENOUS
  Administered 2013-01-08: 1.7 [IU]/h via INTRAVENOUS
  Filled 2013-01-08: qty 1

## 2013-01-08 MED ORDER — ONDANSETRON 8 MG PO TBDP: 8.0000 mg | ORAL_TABLET | Freq: Once | ORAL | Status: AC

## 2013-01-08 MED ORDER — DEXTROSE 50 % IV SOLN: 25.0000 mL | INTRAVENOUS | Status: DC | PRN

## 2013-01-08 MED ORDER — INSULIN REGULAR BOLUS VIA INFUSION
0.0000 [IU] | Freq: Three times a day (TID) | INTRAVENOUS | Status: DC
  Filled 2013-01-08: qty 10

## 2013-01-08 MED ORDER — LORAZEPAM 2 MG/ML IJ SOLN
0.5000 mg | Freq: Every day | INTRAMUSCULAR | Status: DC
  Administered 2013-01-09: 0.5 mg via INTRAVENOUS
  Filled 2013-01-08: qty 1

## 2013-01-08 MED ORDER — MORPHINE SULFATE 2 MG/ML IJ SOLN
2.0000 mg | Freq: Once | INTRAMUSCULAR | Status: AC
  Administered 2013-01-08: 2 mg via INTRAVENOUS
  Filled 2013-01-08: qty 1

## 2013-01-08 MED ORDER — TETANUS-DIPHTH-ACELL PERTUSSIS 5-2.5-18.5 LF-MCG/0.5 IM SUSP
0.5000 mL | Freq: Once | INTRAMUSCULAR | Status: DC
  Filled 2013-01-08: qty 0.5

## 2013-01-08 MED ORDER — TETANUS-DIPHTHERIA TOXOIDS TD 5-2 LFU IM INJ
0.5000 mL | INJECTION | Freq: Once | INTRAMUSCULAR | Status: AC
  Administered 2013-01-08: 0.5 mL via INTRAMUSCULAR
  Filled 2013-01-08: qty 0.5

## 2013-01-08 MED ORDER — DEXTROSE-NACL 5-0.45 % IV SOLN
INTRAVENOUS | Status: DC
  Administered 2013-01-08: 23:00:00 via INTRAVENOUS

## 2013-01-08 NOTE — ED Notes (Signed)
CRITICAL VALUE ALERT  Critical value received:  575 Glucose  Date of notification:  01/08/13  Time of notification:  1826  Critical value read back:yes  Nurse who received alert:  Marius Ditch  MD notified (1st page):  Dr. Hyacinth Meeker  Time of first page:  1826  MD notified (2nd page):  Time of second page:  Responding MD:  [email or web address]  Time MD responded:  1626

## 2013-01-08 NOTE — H&P (Addendum)
[facility] History and Physical  [name] [name] GNF:[id number] DOB: 12-14-1970 DOA: 01/08/2013  Referring physician: Antonieta Loveless  PCP: [name], MD  Specialists: Phone consultation with Neurosurgery  Chief Complaint: Fall with Head Contusion   HPI: [name] [name] is a 42 y.o. 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 Found on CT to have subarachnoid blood. EDP discussed with NS who advised this usually has a benign course and requested hospitalist admission. On evaluation of the patient she requested transfer to [facility] where if needed she could have neurosurgical intervention and specialized care.   Review of Systems: The patient denies anorexia, fever, weight loss,decreased hearing, hoarseness, chest pain, syncope, dyspnea on exertion, peripheral edema, hemoptysis, abdominal pain, melena, hematochezia, severe indigestion/heartburn, hematuria, incontinence, genital sores, muscle weakness, suspicious skin lesions, transient blindness, difficulty walking, depression, unusual weight change, abnormal bleeding, enlarged lymph nodes, angioedema, and breast masses.   Past Medical History  Diagnosis Date  . Pancreatitis   . Diabetes mellitus    Past Surgical History  Procedure Laterality Date  . Whipple procedure    . Intestinal blockage    . Abdominal surgery    . Abdominal hysterectomy    . Cholecystectomy     Social History:  reports that she has never smoked. She does not have any smokeless tobacco history on file. She reports that she does not drink alcohol or use illicit  drugs. Independently at home.  Allergies  Allergen Reactions  . Metoclopramide Hcl     REGLAN REACTION: "lock-jaw"    History reviewed. No pertinent family history. Father has diabetes, mother has fibromyalgia.   Prior to Admission medications   Medication Sig Start Date End Date Taking? Authorizing Provider  docusate sodium (COLACE) 100 MG capsule Take 1 capsule (100 mg total) by mouth every 12 (twelve) hours. 12/25/12  Yes Shelda Jakes, MD  dronabinol (MARINOL) 5 MG capsule Take 5 mg by mouth 4 (four) times daily as needed (for appetite).    Yes Historical Provider, MD  DULoxetine (CYMBALTA) 30 MG capsule Take 30 mg by mouth 2 (two) times daily.    Yes Historical Provider, MD  gabapentin (NEURONTIN) 800 MG tablet Take 800 mg by mouth every 8 (eight) hours.   Yes Historical Provider, MD  insulin aspart (NOVOLOG) 100 UNIT/ML injection Inject 0-5 Units into the skin at bedtime. 05/14/12 05/14/13 Yes Nimish Normajean Glasgow, MD  insulin glargine (LANTUS) 100 UNIT/ML injection Inject 10 Units into the skin at bedtime. 12/25/12 12/25/13 Yes Campbell Riches, NP  Insulin Pen Needle (PEN NEEDLES 5/16") 30G X 8 MM MISC Inject 1 pen into the skin at bedtime. 12/26/12  Yes Campbell Riches, NP  fentaNYL (DURAGESIC - DOSED MCG/HR) 25 MCG/HR Place 1 patch onto the skin every 3 (three) days.    Historical Provider, MD  HYDROmorphone (DILAUDID) 4 MG tablet Take 4 mg by mouth every 6 (six) hours as needed for pain.    Historical Provider, MD  omeprazole (PRILOSEC) 20 MG capsule Take 20 mg by mouth 2 (two) times daily.    Historical Provider, MD  promethazine (PHENERGAN) 25 MG tablet Take 1 tablet (25 mg total) by mouth every 6 (six)  hours as needed for nausea. Do not take with Zofran 12/25/12   Campbell Riches, NP   Physical Exam: Filed Vitals:   01/08/13 1408 01/08/13 1705 01/08/13 2214  BP: 94/47 110/59 108/68  Pulse: 107 104 101  Temp: 97.8 F (36.6 C)    TempSrc: Oral    Resp: 18 21 18   Height: 5'  3" (1.6 m)    Weight: 48.081 kg (106 lb)    SpO2: 100% 97% 97%     General:  Thin, pale, NAD  Eyes: injected scleera, EOMI  ENT: normal  Neck: mild muscluar tenderness  Cardiovascular: RRR  Respiratory: CTAB  Abdomen: Normal  Skin: OVER RIGHT EYE IS A LARGE CONTUSION, PROPTOSIS  Musculoskeletal: normal  Psychiatric: normal  Neurologic: non-focal  Labs on Admission:  Basic Metabolic Panel:  Recent Labs Lab 01/08/13 1750  NA 128*  K 4.9  CL 95*  CO2 22  GLUCOSE 575*  BUN 8  CREATININE 0.45*  CALCIUM 8.9   CBC:  Recent Labs Lab 01/08/13 1750  WBC 6.7  NEUTROABS 4.6  HGB 13.3  HCT 38.7  MCV 94.4  PLT 195   CBG:  Recent Labs Lab 01/08/13 2031 01/08/13 2149  GLUCAP 325* 231*    Radiological Exams on Admission: Ct Head Wo Contrast  01/08/2013  *RADIOLOGY REPORT*  Clinical Data:  Trauma, fell onto concrete curb, right supraorbital hematoma, headache, blurred vision right eye, no loss of consciousness, cervical soreness  CT HEAD WITHOUT CONTRAST CT CERVICAL SPINE WITHOUT CONTRAST  Technique:  Multidetector CT imaging of the head and cervical spine was performed following the standard protocol without intravenous contrast.  Multiplanar CT image reconstructions of the cervical spine were also generated.  Comparison:  None  CT HEAD  Findings: Normal ventricular morphology. No midline shift or mass effect. High attenuation is identified at several sulci in the right frontoparietal region compatible with subarachnoid hemorrhage. No intraparenchymal or intraventricular hemorrhage identified. No additional extra-axial collections. Additional small amount of subarachnoid blood at inferior right sylvian fissure. Right periorbital supraorbital hematoma. Skull intact.  IMPRESSION: Small foci of subarachnoid hemorrhage identified at the right frontoparietal region and at the base of the right sylvian fissure. Right periorbital/supraorbital hematoma. No additional acute  intracranial abnormalities.  CT CERVICAL SPINE  Findings: Visualized skull base intact. Tiny nonspecific bilateral thyroid nodules. Prevertebral soft tissues normal thickness. Vertebral body and disc space heights maintained. No acute fracture, subluxation or bone destruction. Minimal scattered facet degenerative changes. Lung apices clear.  IMPRESSION: No acute cervical spine abnormalities. Tiny bilateral nonspecific thyroid nodules. Sub-centimeter thyroid nodule(s) noted, too small to characterize, but most likely benign in the absence of known clinical risk factors for thyroid carcinoma.  Critical Value/emergent results were called by telephone at the time of interpretation on 01/08/2013 at 1707 hours to Dr. Hyacinth Meeker, who verbally acknowledged these results.   Original Report Authenticated By: Ulyses Southward, M.D.    Ct Cervical Spine Wo Contrast  01/08/2013  *RADIOLOGY REPORT*  Clinical Data:  Trauma, fell onto concrete curb, right supraorbital hematoma, headache, blurred vision right eye, no loss of consciousness, cervical soreness  CT HEAD WITHOUT CONTRAST CT CERVICAL SPINE WITHOUT CONTRAST  Technique:  Multidetector CT imaging of the head and cervical spine was performed following the standard protocol without intravenous contrast.  Multiplanar CT image reconstructions of the cervical spine were also generated.  Comparison:  None  CT HEAD  Findings: Normal ventricular morphology. No midline shift or mass effect. High attenuation is identified at several  sulci in the right frontoparietal region compatible with subarachnoid hemorrhage. No intraparenchymal or intraventricular hemorrhage identified. No additional extra-axial collections. Additional small amount of subarachnoid blood at inferior right sylvian fissure. Right periorbital supraorbital hematoma. Skull intact.  IMPRESSION: Small foci of subarachnoid hemorrhage identified at the right frontoparietal region and at the base of the right sylvian fissure. Right  periorbital/supraorbital hematoma. No additional acute intracranial abnormalities.  CT CERVICAL SPINE  Findings: Visualized skull base intact. Tiny nonspecific bilateral thyroid nodules. Prevertebral soft tissues normal thickness. Vertebral body and disc space heights maintained. No acute fracture, subluxation or bone destruction. Minimal scattered facet degenerative changes. Lung apices clear.  IMPRESSION: No acute cervical spine abnormalities. Tiny bilateral nonspecific thyroid nodules. Sub-centimeter thyroid nodule(s) noted, too small to characterize, but most likely benign in the absence of known clinical risk factors for thyroid carcinoma.  Critical Value/emergent results were called by telephone at the time of interpretation on 01/08/2013 at 1707 hours to Dr. Hyacinth Meeker, who verbally acknowledged these results.   Original Report Authenticated By: Ulyses Southward, M.D.     EKG: Independently reviewed.   Assessment/Plan 1. Subarachnoid Arachnoid Hemorrhage s/p Fall with contusion over left eye  Small area of bleeding, no clinical signs of TBI, neuro-exam is non-focal, she is AOx3, has HA and soreness  Neurosurgery advised hospitalist admission for observation and repeat scan of head in AM, typically small SAH post-traumatic has benign course.  She unfortunately has hyperglycemia which could complicate her SAH so she is on glucomander protocol.  Will give small dose of ativan as seizure prophylaxis prior to transport  2. Hyperglycemia, Diabetes  No gap but given neuro status need aggressive control, start glucose stabilizer protocol a  3. Fall, presumed accidental per history  Monitor on tele, UDS  Code Status: Full Code Family Communication: Discussed with patient and family at the bedside Disposition Plan:  Transfer to neuro step down at [facility].  Time spent: 70 minutes  [facility] Pager [phone]  If 7PM-7AM, please contact  night-coverage [email or web address] Password [facility] 01/08/2013, 10:21 PM

## 2013-01-08 NOTE — ED Notes (Signed)
Bacitracin and sterile dressing with gauze wrap applied to right knee and left shin. No active bleeding noted. Pt tolerated well.

## 2013-01-08 NOTE — ED Notes (Addendum)
Tripped and fell on curb, has large hematoma to rt eyebrow, numerous abrasions to legs,  No LOC,  Alert,  C collar and ice pack at triage.

## 2013-01-08 NOTE — ED Provider Notes (Signed)
History     CSN: [id number]  Arrival date & time 01/08/13  1359   First MD Initiated Contact with Patient 01/08/13 1518      Chief Complaint  Patient presents with  . 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 This was acute in onset, it occurred a couple of hours prior to arrival and was not associated with loss of consciousness, blurred vision or focal weakness. She states that she does have some neck pain but no numbness or focal weakness of her arms or legs. She has associated abrasions to her bilateral lower extremities below the knee is no tenderness or weakness and has been able to ambulate into the emergency department. She states that this was a mechanical fall catching the bottom of her foot on the curb as she tried to step up onto it. She does not take any anticoagulation  The history is provided by the patient and a friend.    Past Medical History  Diagnosis Date  . Pancreatitis   . Diabetes mellitus     Past Surgical History  Procedure Laterality Date  . Whipple procedure    . Intestinal blockage    . Abdominal surgery    . Abdominal hysterectomy    . Cholecystectomy      History reviewed. No pertinent family history.  History  Substance Use Topics  . Smoking status: Never Smoker   . Smokeless tobacco: Not on file  . Alcohol Use: No    OB History   Grav Para Term Preterm Abortions TAB SAB Ect Mult Living                  Review of Systems  All other systems reviewed and are negative.    Allergies  Metoclopramide hcl  Home Medications   Current Outpatient Rx  Name  Route  Sig  Dispense  Refill  . docusate sodium (COLACE) 100 MG capsule   Oral   Take 1 capsule (100 mg total) by mouth every 12 (twelve) hours.   60  capsule   0   . dronabinol (MARINOL) 5 MG capsule   Oral   Take 5 mg by mouth 4 (four) times daily as needed (for appetite).          . DULoxetine (CYMBALTA) 30 MG capsule   Oral   Take 30 mg by mouth 2 (two) times daily.          Marland Kitchen gabapentin (NEURONTIN) 800 MG tablet   Oral   Take 800 mg by mouth every 8 (eight) hours.         . insulin aspart (NOVOLOG) 100 UNIT/ML injection   Subcutaneous   Inject 0-5 Units into the skin at bedtime.   1 vial      . insulin glargine (LANTUS) 100 UNIT/ML injection   Subcutaneous   Inject 10 Units into the skin at bedtime.         . Insulin Pen Needle (PEN NEEDLES 5/16") 30G X 8 MM MISC   Subcutaneous   Inject 1 pen into the skin at bedtime.   100 each   11   . fentaNYL (DURAGESIC - DOSED MCG/HR) 25 MCG/HR   Transdermal   Place 1 patch onto the skin every 3 (three) days.         Marland Kitchen  HYDROmorphone (DILAUDID) 4 MG tablet   Oral   Take 4 mg by mouth every 6 (six) hours as needed for pain.         Marland Kitchen omeprazole (PRILOSEC) 20 MG capsule   Oral   Take 20 mg by mouth 2 (two) times daily.         . promethazine (PHENERGAN) 25 MG tablet   Oral   Take 1 tablet (25 mg total) by mouth every 6 (six) hours as needed for nausea. Do not take with Zofran   30 tablet   0     BP 110/59  Pulse 104  Temp(Src) 97.8 F (36.6 C) (Oral)  Resp 21  Ht 5\' 3"  (1.6 m)  Wt 106 lb (48.081 kg)  BMI 18.78 kg/m2  SpO2 97%  Physical Exam  Nursing note and vitals reviewed. Constitutional: She appears well-developed and well-nourished. No distress.  HENT:  Head: Normocephalic.  Mouth/Throat: Oropharynx is clear and moist. No oropharyngeal exudate.  Hematoma to the right for head and eyebrow, no underlying lacerations, no deformities of the face, no hemotympanum, no malocclusion, no battle sign  Eyes: Conjunctivae and EOM are normal. Pupils are equal, round, and reactive to light. Right eye exhibits no discharge. Left eye exhibits no  discharge. No scleral icterus.  Pupillary exam is normal, symmetrical and responsive to light  Neck: No JVD present. No thyromegaly present.  Cardiovascular: Normal rate, regular rhythm, normal heart sounds and intact distal pulses.  Exam reveals no gallop and no friction rub.   No murmur heard. Pulmonary/Chest: Effort normal and breath sounds normal. No respiratory distress. She has no wheezes. She has no rales.  Musculoskeletal: Normal range of motion. She exhibits no edema and no tenderness (Mild tenderness over the cervical spine, no tenderness over the thoracic or lumbar spines).  Normal strength in all 4 extremities, normal range of motion of the major joints of the hands, the upper extremities, lower extremities including hips knees and ankles bilaterally  Lymphadenopathy:    She has no cervical adenopathy.  Neurological: She is alert. Coordination normal.  Speech is clear, movements are exact and calculated, no ataxia, follows commands without difficulty  Skin: Skin is warm and dry. No rash noted. No erythema.  Abrasions to the bilateral lower extremities, no lacerations  Psychiatric: She has a normal mood and affect. Her behavior is normal.    ED Course  Procedures (including critical care time)  Labs Reviewed  BASIC METABOLIC PANEL - Abnormal; Notable for the following:    Sodium 128 (*)    Chloride 95 (*)    Glucose, Bld 575 (*)    Creatinine, Ser 0.45 (*)    All other components within normal limits  CBC WITH DIFFERENTIAL  APTT  PROTIME-INR   Ct Head Wo Contrast  01/08/2013  *RADIOLOGY REPORT*  Clinical Data:  Trauma, fell onto concrete curb, right supraorbital hematoma, headache, blurred vision right eye, no loss of consciousness, cervical soreness  CT HEAD WITHOUT CONTRAST CT CERVICAL SPINE WITHOUT CONTRAST  Technique:  Multidetector CT imaging of the head and cervical spine was performed following the standard protocol without intravenous contrast.  Multiplanar CT image  reconstructions of the cervical spine were also generated.  Comparison:  None  CT HEAD  Findings: Normal ventricular morphology. No midline shift or mass effect. High attenuation is identified at several sulci in the right frontoparietal region compatible with subarachnoid hemorrhage. No intraparenchymal or intraventricular hemorrhage identified. No additional extra-axial collections. Additional small amount of subarachnoid  blood at inferior right sylvian fissure. Right periorbital supraorbital hematoma. Skull intact.  IMPRESSION: Small foci of subarachnoid hemorrhage identified at the right frontoparietal region and at the base of the right sylvian fissure. Right periorbital/supraorbital hematoma. No additional acute intracranial abnormalities.  CT CERVICAL SPINE  Findings: Visualized skull base intact. Tiny nonspecific bilateral thyroid nodules. Prevertebral soft tissues normal thickness. Vertebral body and disc space heights maintained. No acute fracture, subluxation or bone destruction. Minimal scattered facet degenerative changes. Lung apices clear.  IMPRESSION: No acute cervical spine abnormalities. Tiny bilateral nonspecific thyroid nodules. Sub-centimeter thyroid nodule(s) noted, too small to characterize, but most likely benign in the absence of known clinical risk factors for thyroid carcinoma.  Critical Value/emergent results were called by telephone at the time of interpretation on 01/08/2013 at 1707 hours to Dr. Hyacinth Meeker, who verbally acknowledged these results.   Original Report Authenticated By: Ulyses Southward, M.D.    Ct Cervical Spine Wo Contrast  01/08/2013  *RADIOLOGY REPORT*  Clinical Data:  Trauma, fell onto concrete curb, right supraorbital hematoma, headache, blurred vision right eye, no loss of consciousness, cervical soreness  CT HEAD WITHOUT CONTRAST CT CERVICAL SPINE WITHOUT CONTRAST  Technique:  Multidetector CT imaging of the head and cervical spine was performed following the standard  protocol without intravenous contrast.  Multiplanar CT image reconstructions of the cervical spine were also generated.  Comparison:  None  CT HEAD  Findings: Normal ventricular morphology. No midline shift or mass effect. High attenuation is identified at several sulci in the right frontoparietal region compatible with subarachnoid hemorrhage. No intraparenchymal or intraventricular hemorrhage identified. No additional extra-axial collections. Additional small amount of subarachnoid blood at inferior right sylvian fissure. Right periorbital supraorbital hematoma. Skull intact.  IMPRESSION: Small foci of subarachnoid hemorrhage identified at the right frontoparietal region and at the base of the right sylvian fissure. Right periorbital/supraorbital hematoma. No additional acute intracranial abnormalities.  CT CERVICAL SPINE  Findings: Visualized skull base intact. Tiny nonspecific bilateral thyroid nodules. Prevertebral soft tissues normal thickness. Vertebral body and disc space heights maintained. No acute fracture, subluxation or bone destruction. Minimal scattered facet degenerative changes. Lung apices clear.  IMPRESSION: No acute cervical spine abnormalities. Tiny bilateral nonspecific thyroid nodules. Sub-centimeter thyroid nodule(s) noted, too small to characterize, but most likely benign in the absence of known clinical risk factors for thyroid carcinoma.  Critical Value/emergent results were called by telephone at the time of interpretation on 01/08/2013 at 1707 hours to Dr. Hyacinth Meeker, who verbally acknowledged these results.   Original Report Authenticated By: Ulyses Southward, M.D.      1. SAH (subarachnoid hemorrhage)   2. Hyperglycemia   3. Hyponatremia       MDM  Overall the patient is well-appearing, she does have a hematoma to her right forehead but no focal neurologic signs, no vomiting, no loss of consciousness. CT scan of the head and cervical spine have been ordered, the patient was  immobilized with a [place] cervical collar on arrival and is compliant with this intervention. Pain medications ordered. Tetanus updated, wound care provided, sterile dry dressings and bacitracin  CT scan shows that the patient is a small amount of subarachnoid blood, this is consistent with the location of her head injury and a significant subcutaneous hematoma. She has no spinal fractures.  I discussed her care with the neurosurgeon Dr. Phoebe Perch who agreed that the patient needs observation with a repeat CT scan in the morning. In addition I have discussed the patient's care with the internist Dr.  Phillips Odor for admission to the hospital as the patient does have significant hyperglycemia. There does not appear to be an anion gap however given the level of hyperglycemia it would be safer to admit the patient at the hospital forglucose stabilizer therapy on an insulin drip./  Medical care provided for intracranial hemorrhage along with severe hyperglycemia in a patient with ongoing headache.  CRITICAL CARE Performed by: Vida Roller   Total critical care time: 35  Critical care time was exclusive of separately billable procedures and treating other patients.  Critical care was necessary to treat or prevent imminent or life-threatening deterioration.  Critical care was time spent personally by me on the following activities: development of treatment plan with patient and/or surrogate as well as nursing, discussions with consultants, evaluation of patient's response to treatment, examination of patient, obtaining history from patient or surrogate, ordering and performing treatments and interventions, ordering and review of laboratory studies, ordering and review of radiographic studies, pulse oximetry and re-evaluation of patient's condition.       Vida Roller, MD 01/08/13 [phone]

## 2013-01-09 ENCOUNTER — Inpatient Hospital Stay (HOSPITAL_COMMUNITY): Payer: Medicare Other

## 2013-01-09 DIAGNOSIS — I609 Nontraumatic subarachnoid hemorrhage, unspecified: Secondary | ICD-10-CM | POA: Diagnosis not present

## 2013-01-09 DIAGNOSIS — W101XXA Fall (on)(from) sidewalk curb, initial encounter: Secondary | ICD-10-CM

## 2013-01-09 DIAGNOSIS — T148XXA Other injury of unspecified body region, initial encounter: Secondary | ICD-10-CM | POA: Diagnosis not present

## 2013-01-09 DIAGNOSIS — S065X9A Traumatic subdural hemorrhage with loss of consciousness of unspecified duration, initial encounter: Secondary | ICD-10-CM | POA: Diagnosis not present

## 2013-01-09 LAB — CBC
MCH: 32.7 pg (ref 26.0–34.0)
Platelets: 187 10*3/uL (ref 150–400)
RBC: 3.76 MIL/uL — ABNORMAL LOW (ref 3.87–5.11)

## 2013-01-09 LAB — BASIC METABOLIC PANEL
Calcium: 8.1 mg/dL — ABNORMAL LOW (ref 8.4–10.5)
GFR calc Af Amer: >90 mL/min (ref >90)
GFR calc non Af Amer: >90 mL/min (ref >90)
Glucose, Bld: 174 mg/dL — ABNORMAL HIGH (ref 70–99)
Potassium: 3.6 mEq/L (ref 3.5–5.1)
Sodium: 135 mEq/L (ref 135–145)

## 2013-01-09 LAB — GLUCOSE, CAPILLARY
Glucose-Capillary: 111 mg/dL — ABNORMAL HIGH (ref 70–99)
Glucose-Capillary: 164 mg/dL — ABNORMAL HIGH (ref 70–99)

## 2013-01-09 LAB — MRSA PCR SCREENING: MRSA by PCR: NEGATIVE

## 2013-01-09 MED ORDER — PANTOPRAZOLE SODIUM 40 MG PO TBEC
40.0000 mg | DELAYED_RELEASE_TABLET | Freq: Every day | ORAL | Status: DC
  Administered 2013-01-09: 40 mg via ORAL
  Filled 2013-01-09: qty 1

## 2013-01-09 MED ORDER — ONDANSETRON HCL 4 MG/2ML IJ SOLN
4.0000 mg | Freq: Four times a day (QID) | INTRAMUSCULAR | Status: DC | PRN
  Administered 2013-01-09 (×2): 4 mg via INTRAVENOUS
  Filled 2013-01-09 (×2): qty 2

## 2013-01-09 MED ORDER — PROMETHAZINE HCL 25 MG/ML IJ SOLN
12.5000 mg | Freq: Four times a day (QID) | INTRAMUSCULAR | Status: DC | PRN
  Filled 2013-01-09: qty 1

## 2013-01-09 MED ORDER — SENNOSIDES-DOCUSATE SODIUM 8.6-50 MG PO TABS
1.0000 | ORAL_TABLET | Freq: Every evening | ORAL | Status: DC | PRN
  Filled 2013-01-09: qty 1

## 2013-01-09 MED ORDER — ACETAMINOPHEN 325 MG PO TABS: 650.0000 mg | ORAL_TABLET | Freq: Four times a day (QID) | ORAL | Status: DC | PRN

## 2013-01-09 MED ORDER — SODIUM CHLORIDE 0.9 % IV SOLN
INTRAVENOUS | Status: DC
  Administered 2013-01-09: 03:00:00 via INTRAVENOUS

## 2013-01-09 MED ORDER — INFLUENZA VIRUS VACC SPLIT PF IM SUSP
0.5000 mL | Freq: Once | INTRAMUSCULAR | Status: AC
  Administered 2013-01-09: 0.5 mL via INTRAMUSCULAR
  Filled 2013-01-09: qty 0.5

## 2013-01-09 MED ORDER — ONDANSETRON HCL 4 MG PO TABS: 4.0000 mg | ORAL_TABLET | Freq: Four times a day (QID) | ORAL | Status: DC | PRN

## 2013-01-09 MED ORDER — PROMETHAZINE HCL 25 MG/ML IJ SOLN
12.5000 mg | Freq: Four times a day (QID) | INTRAMUSCULAR | Status: DC | PRN
  Administered 2013-01-09 (×2): 12.5 mg via INTRAVENOUS
  Filled 2013-01-09: qty 1

## 2013-01-09 MED ORDER — INSULIN GLARGINE 100 UNIT/ML ~~LOC~~ SOLN
10.0000 [IU] | Freq: Every day | SUBCUTANEOUS | Status: DC
  Administered 2013-01-09: 10 [IU] via SUBCUTANEOUS
  Filled 2013-01-09 (×2): qty 0.1

## 2013-01-09 MED ORDER — SODIUM CHLORIDE 0.9 % IJ SOLN
3.0000 mL | Freq: Two times a day (BID) | INTRAMUSCULAR | Status: DC
  Administered 2013-01-09 (×2): 3 mL via INTRAVENOUS

## 2013-01-09 MED ORDER — INSULIN ASPART 100 UNIT/ML ~~LOC~~ SOLN
0.0000 [IU] | Freq: Three times a day (TID) | SUBCUTANEOUS | Status: DC
  Administered 2013-01-09: 3 [IU] via SUBCUTANEOUS

## 2013-01-09 MED ORDER — ACETAMINOPHEN 650 MG RE SUPP: 650.0000 mg | Freq: Four times a day (QID) | RECTAL | Status: DC | PRN

## 2013-01-09 MED ORDER — DULOXETINE HCL 30 MG PO CPEP
30.0000 mg | ORAL_CAPSULE | Freq: Two times a day (BID) | ORAL | Status: DC
  Administered 2013-01-09: 30 mg via ORAL
  Filled 2013-01-09 (×2): qty 1

## 2013-01-09 MED ORDER — INSULIN ASPART 100 UNIT/ML ~~LOC~~ SOLN: 0.0000 [IU] | Freq: Every day | SUBCUTANEOUS | Status: DC

## 2013-01-09 NOTE — Progress Notes (Signed)
Pt seen and examined, transferred to [facility] from [facility] for neuro eval s/p fall/SAH. Currently patient admits to a headache rated 8/10 and nausea, slight blurred vision in her right eye. VSS. A&Ox3. Hematoma present right orbit/temple. EOMI, pupils equal and reactive. Lungs CTA, heart RRR. Brief neuro exam normal, grip strength equal in bilateral UE. Mentation appropriate.   Repeat CT scan ordered for AM, along with repeat BMET (pt was hyponatremic secondary to hyperglycemia). Current glucose 150s, will discontinue insulin GTT and initiate lantus with SSI. Will initiate neurochecks, and closely monitor blood pressure. Ice to hematoma.

## 2013-01-09 NOTE — Progress Notes (Signed)
Utilization review completed.  

## 2013-01-09 NOTE — ED Notes (Signed)
Report given to [facility] with [facility]

## 2013-01-09 NOTE — Discharge Summary (Signed)
Physician Discharge Summary  [name] [name] UJW:[id number] DOB: 05-Sep-1971 DOA: 01/08/2013  PCP: [name], MD  Admit date: 01/08/2013 Discharge date: 01/09/2013  Time spent: 30 minutes  Recommendations for Outpatient Follow-up:  1. Follow up with primary care provider within one week of discharge 2. Report to ER immediately if develops peristant nausea/vomiting, headaches or new visual disturbances 3. No driving for one week 4. No ASA or aspirin based products for 2 weeks 5. Ice to right eye swelling as needed (see instructions)  Discharge Diagnoses:  Principal Problem:   Subarachnoid bleed Active Problems:   Fall involving sidewalk curb   Contusion   Discharge Condition: stable  Diet recommendation: carbohydrate modified  Filed Weights   01/08/13 1408 01/09/13 0230  Weight: 48.081 kg (106 lb) 48.8 kg (107 lb 9.4 oz)    History of present illness:  [name] [name] is a 42 y.o. 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 Found on CT to have subarachnoid blood. EDP discussed with NS who advised this usually has a benign course and requested Hospitalist admission. On evaluation she requested transfer to [facility].  Hospital Course:   Traumatic subarachnoid hemorrhage after fall Patient has remained neurologically stable since admission. No gait disturbances or other focal neurological symptoms. Followup CT scan of the head on 01/09/2013 demonstrated resolving subarachnoid hemorrhage with trace residual blood in the right frontal lobe sulci. She was evaluated by Neurosurgery who cleared her for  discharge on 01/09/2013.  Right supraorbital contusion after fall Patient presented with significant swelling which partially obstructed her visual field. Subsequently the swelling has nearly resolved although she has significant periorbital ecchymosis remaining. The eye itself is without any obvious trauma. CT of the head completed on date of discharge revealed a resolving right supraorbital soft tissue hematoma. Patient has been instructed to put ice packs as needed over the eye to prevent further swelling.  There is free range of motion in the eye, and no visual acuity loss, pain, or photophobia.  Diabetes on insulin CBGs have remained stable this hospitalization. No changes made to her usual medications.  Chronic pancreatitis after Whipple procedure Patient will resume her usual chronic pain medications at discharge.  Procedures: None  Consultations:  Neurosurgery / Dr. Phoebe Perch  Discharge Exam: Filed Vitals:   01/09/13 0230 01/09/13 0400 01/09/13 0700 01/09/13 0745  BP: 105/78 103/73  96/62  Pulse: 96 103  90  Temp: 98 F (36.7 C) 98 F (36.7 C) 97.9 F (36.6 C) 97.9 F (36.6 C)  TempSrc: Oral Oral Oral Oral  Resp: 18 11  10   Height: 5\' 3"  (1.6 m)     Weight: 48.8 kg (107 lb 9.4 oz)     SpO2: 96% 92%  96%   Exam: General: No acute respiratory distress EENT: Right periorbital contusion remains but without any obstruction of visual field, significant periorbital ecchymosis extending up towards the right temple, right eye without any obvious traumatic injury Lungs: Clear to auscultation bilaterally without wheezes or crackles, RA Cardiovascular: Regular rate and rhythm without murmur gallop or rub normal S1 and S2, no peripheral edema or JVD Abdomen: Nontender, nondistended, soft, bowel sounds positive, no rebound, no ascites, no appreciable mass  Musculoskeletal: No significant cyanosis, clubbing of bilateral lower extremities Neurological: Alert and oriented x 3, moves all  extremities x 4 without focal neurological deficits, CN 2-13 intact   Discharge Instructions      Discharge Orders   Future Orders Complete By Expires     Call MD for:  difficulty breathing, headache or visual disturbances  As directed     Call MD for:  persistant dizziness or light-headedness  As directed     Call MD for:  persistant nausea and vomiting  As directed     Call MD for:  severe uncontrolled pain  As directed     Comments:      Severe headache    Diet Carb Modified  As directed     Driving Restrictions  As directed     Comments:      No driving for one week    Increase activity slowly  As directed     Other Restrictions  As directed     Comments:      No Aspirin or Aspirin based products for 2 weeks- if not sure please ask the pharmacist before purchasing medications        Medication List    TAKE these medications       docusate sodium 100 MG capsule  Commonly known as:  COLACE  Take 1 capsule (100 mg total) by mouth every 12 (twelve) hours.     dronabinol 5 MG capsule  Commonly known as:  MARINOL  Take 5 mg by mouth 4 (four) times daily as needed (for appetite).     DULoxetine 30 MG capsule  Commonly known as:  CYMBALTA  Take 30 mg by mouth 2 (two) times daily.     fentaNYL 25 MCG/HR  Commonly known as:  DURAGESIC - dosed mcg/hr  Place 1 patch onto the skin every 3 (three) days.     gabapentin 800 MG tablet  Commonly known as:  NEURONTIN  Take 800 mg by mouth every 8 (eight) hours.     HYDROmorphone 4 MG tablet  Commonly known as:  DILAUDID  Take 4 mg by mouth every 6 (six) hours as needed for pain.     insulin aspart 100 UNIT/ML injection  Commonly known as:  novoLOG  Inject 0-5 Units into the skin at bedtime.     insulin glargine 100 UNIT/ML injection  Commonly known as:  LANTUS  Inject 10 Units into the skin at bedtime.     omeprazole 20 MG capsule  Commonly known as:  PRILOSEC  Take 20 mg by mouth 2 (two) times daily.     Pen  Needles 5/16" 30G X 8 MM Misc  Inject 1 pen into the skin at bedtime.     promethazine 25 MG tablet  Commonly known as:  PHENERGAN  Take 1 tablet (25 mg total) by mouth every 6 (six) hours as needed for nausea. Do not take with Zofran       Follow-up Information   Follow up with [name], MD. Schedule an appointment as soon as possible for a visit in 1 week.   Contact information:   [address] [place] [address] [phone]       The results of significant diagnostics from this hospitalization (including imaging, microbiology, ancillary and laboratory) are listed below for reference.    Significant Diagnostic Studies: Ct Head Wo Contrast  01/09/2013  *RADIOLOGY REPORT*  Clinical Data: Right frontal supraorbital headache with decreased and blurred  vision in the right eye.  Subarachnoid hemorrhage.  CT HEAD WITHOUT CONTRAST  Technique:  Contiguous axial images were obtained from the base of the skull through the vertex without contrast.  Comparison: 01/08/2013.  Findings: Previously seen area of high attenuation in the right middle cranial fossa is no longer identified.  However, there is residual linear hyper attenuation within the sulci of the right frontal lobe (images 13 14).  No evidence of acute infarct, mass lesion, mass effect or hydrocephalus.  Probable dilated perivascular space in the left basal ganglia.  Visualized portions of the paranasal sinuses and mastoid air cells are clear.  No fracture. Resolving right supraorbital soft tissue hematoma.  IMPRESSION:  1.  Resolving subarachnoid hemorrhage with trace residual blood in the right frontal lobe sulci. 2.  Resolving right supraorbital soft tissue hematoma.   Original Report Authenticated By: Leanna Battles, M.D.    Ct Head Wo Contrast  01/08/2013  *RADIOLOGY REPORT*  Clinical Data:  Trauma, fell onto concrete curb, right supraorbital hematoma, headache, blurred vision right eye, no loss of consciousness, cervical  soreness  CT HEAD WITHOUT CONTRAST CT CERVICAL SPINE WITHOUT CONTRAST  Technique:  Multidetector CT imaging of the head and cervical spine was performed following the standard protocol without intravenous contrast.  Multiplanar CT image reconstructions of the cervical spine were also generated.  Comparison:  None  CT HEAD  Findings: Normal ventricular morphology. No midline shift or mass effect. High attenuation is identified at several sulci in the right frontoparietal region compatible with subarachnoid hemorrhage. No intraparenchymal or intraventricular hemorrhage identified. No additional extra-axial collections. Additional small amount of subarachnoid blood at inferior right sylvian fissure. Right periorbital supraorbital hematoma. Skull intact.  IMPRESSION: Small foci of subarachnoid hemorrhage identified at the right frontoparietal region and at the base of the right sylvian fissure. Right periorbital/supraorbital hematoma. No additional acute intracranial abnormalities.  CT CERVICAL SPINE  Findings: Visualized skull base intact. Tiny nonspecific bilateral thyroid nodules. Prevertebral soft tissues normal thickness. Vertebral body and disc space heights maintained. No acute fracture, subluxation or bone destruction. Minimal scattered facet degenerative changes. Lung apices clear.  IMPRESSION: No acute cervical spine abnormalities. Tiny bilateral nonspecific thyroid nodules. Sub-centimeter thyroid nodule(s) noted, too small to characterize, but most likely benign in the absence of known clinical risk factors for thyroid carcinoma.  Critical Value/emergent results were called by telephone at the time of interpretation on 01/08/2013 at 1707 hours to Dr. Hyacinth Meeker, who verbally acknowledged these results.   Original Report Authenticated By: Ulyses Southward, M.D.    Ct Cervical Spine Wo Contrast  01/08/2013  *RADIOLOGY REPORT*  Clinical Data:  Trauma, fell onto concrete curb, right supraorbital hematoma, headache,  blurred vision right eye, no loss of consciousness, cervical soreness  CT HEAD WITHOUT CONTRAST CT CERVICAL SPINE WITHOUT CONTRAST  Technique:  Multidetector CT imaging of the head and cervical spine was performed following the standard protocol without intravenous contrast.  Multiplanar CT image reconstructions of the cervical spine were also generated.  Comparison:  None  CT HEAD  Findings: Normal ventricular morphology. No midline shift or mass effect. High attenuation is identified at several sulci in the right frontoparietal region compatible with subarachnoid hemorrhage. No intraparenchymal or intraventricular hemorrhage identified. No additional extra-axial collections. Additional small amount of subarachnoid blood at inferior right sylvian fissure. Right periorbital supraorbital hematoma. Skull intact.  IMPRESSION: Small foci of subarachnoid hemorrhage identified at the right frontoparietal region and at the base of the right sylvian fissure. Right periorbital/supraorbital hematoma. No  additional acute intracranial abnormalities.  CT CERVICAL SPINE  Findings: Visualized skull base intact. Tiny nonspecific bilateral thyroid nodules. Prevertebral soft tissues normal thickness. Vertebral body and disc space heights maintained. No acute fracture, subluxation or bone destruction. Minimal scattered facet degenerative changes. Lung apices clear.  IMPRESSION: No acute cervical spine abnormalities. Tiny bilateral nonspecific thyroid nodules. Sub-centimeter thyroid nodule(s) noted, too small to characterize, but most likely benign in the absence of known clinical risk factors for thyroid carcinoma.  Critical Value/emergent results were called by telephone at the time of interpretation on 01/08/2013 at 1707 hours to Dr. Hyacinth Meeker, who verbally acknowledged these results.   Original Report Authenticated By: Ulyses Southward, M.D.    Ct Abdomen Pelvis W Contrast  12/25/2012  *RADIOLOGY REPORT*  Clinical Data: Upper abdominal  pain.  History of pancreatitis. History of a Whipple procedure.  CT ABDOMEN AND PELVIS WITH CONTRAST  Technique:  Multidetector CT imaging of the abdomen and pelvis was performed following the standard protocol during bolus administration of intravenous contrast.  Contrast: 50mL OMNIPAQUE IOHEXOL 300 MG/ML  SOLN, OMNIPAQUE IOHEXOL 300 MG/ML  SOLN  Comparison: 05/13/2012.  Findings: The lung bases are clear.  No pleural effusion or pulmonary nodule.  No pericardial effusion.  The distal esophagus is unremarkable.  The liver demonstrates fairly marked and diffuse fatty infiltration.  Stable hepatic cyst.  No worrisome hepatic lesions. The spleen is within normal limits in size.  No focal splenic lesions.  The remainder of the pancreas appears stable.  Persistent mild pancreatic ductal dilatation of the tail.  I do not see any evidence of pancreatitis involving the residual pancreatic tissue.  Stable surgical changes from a Whipple procedure.  No complicating features are demonstrated.  There is a persistent area of interstitial change in the mesentery just inferior to the pancreas and surrounding the celiac axis vessels.  There are multiple borderline enlarged lymph nodes which have not changed.  No focal abscess.  The stomach, small bowel and colon unremarkable except for a a large amount of stool throughout the descending colon suggesting constipation.  The aorta is normal in caliber.  The major branch vessels are patent.  The portal and hepatic veins are patent.  The uterus is surgically absent.  No pelvic mass, adenopathy or free pelvic fluid collections.  The bladder is normal.  No inguinal mass or hernia.  The bony structures are intact.  IMPRESSION:  1.  Stable surgical changes in the upper abdomen.  No complicating features are identified. 2.  Chronic area of inflammation, interstitial change and borderline lymph nodes in the small bowel mesentery. 3.  Diffuse fatty infiltration of the liver. 4.  Large  amount of stool throughout a distended colon suggesting constipation.   Original Report Authenticated By: Rudie Meyer, M.D.     Microbiology: Recent Results (from the past 240 hour(s))  MRSA PCR SCREENING     Status: None   Collection Time    01/09/13  2:44 AM      Result Value Range Status   MRSA by PCR NEGATIVE  NEGATIVE Final   Comment:            The GeneXpert MRSA Assay (FDA     approved for NASAL specimens     only), is one component of a     comprehensive MRSA colonization     surveillance program. It is not     intended to diagnose MRSA     infection nor to guide or  monitor treatment for     MRSA infections.     Labs: Basic Metabolic Panel:  Recent Labs Lab 01/08/13 1750 01/09/13 0424  NA 128* 135  K 4.9 3.6  CL 95* 104  CO2 22 23  GLUCOSE 575* 174*  BUN 8 10  CREATININE 0.45* 0.36*  CALCIUM 8.9 8.1*   CBC:  Recent Labs Lab 01/08/13 1750 01/09/13 0424  WBC 6.7 6.9  NEUTROABS 4.6  --   HGB 13.3 12.3  HCT 38.7 35.2*  MCV 94.4 93.6  PLT 195 187   CBG:  Recent Labs Lab 01/08/13 2149 01/08/13 2305 01/09/13 0013 01/09/13 0224 01/09/13 0747  GLUCAP 231* 117* 111* 164* 200*    Signed:  ELLIS,ALLISON L.ANP  [facility] 01/09/2013, 11:28 AM   I have personally examined this patient and reviewed the entire database. I have reviewed the above note, made any necessary editorial changes, and agree with its content.  Lonia Blood, MD [facility]

## 2013-01-09 NOTE — Consult Note (Signed)
Reason for Consult:SAH Referring Physician: Dr [name] [name] is an 42 y.o. female.  HPI: pt fell and hit head yesterday   - No LOC  , CT done showing  Minimal traumatic SAH  - pt admitted for observation.  Had repeat CT today   - pt with no c/o  PMH:  Past Medical History  Diagnosis Date  . Pancreatitis   . Diabetes mellitus     Past Surgical History  Procedure Laterality Date  . Whipple procedure    . Intestinal blockage    . Abdominal surgery    . Abdominal hysterectomy    . Cholecystectomy      Family History: History reviewed. No pertinent family history.  Social History:  reports that she has never smoked. She does not have any smokeless tobacco history on file. She reports that she does not drink alcohol or use illicit drugs.  Allergies:  Allergies  Allergen Reactions  . Metoclopramide Hcl     REGLAN REACTION: "lock-jaw"    Prior to Admission medications   Medication Sig Start Date End Date Taking? Authorizing Provider  docusate sodium (COLACE) 100 MG capsule Take 1 capsule (100 mg total) by mouth every 12 (twelve) hours. 12/25/12  Yes Shelda Jakes, MD  dronabinol (MARINOL) 5 MG capsule Take 5 mg by mouth 4 (four) times daily as needed (for appetite).    Yes Historical Provider, MD  DULoxetine (CYMBALTA) 30 MG capsule Take 30 mg by mouth 2 (two) times daily.    Yes Historical Provider, MD  gabapentin (NEURONTIN) 800 MG tablet Take 800 mg by mouth every 8 (eight) hours.   Yes Historical Provider, MD  insulin aspart (NOVOLOG) 100 UNIT/ML injection Inject 0-5 Units into the skin at bedtime. 05/14/12 05/14/13 Yes Nimish Normajean Glasgow, MD  insulin glargine (LANTUS) 100 UNIT/ML injection Inject 10 Units into the skin at bedtime. 12/25/12 12/25/13 Yes Campbell Riches, NP  Insulin Pen Needle (PEN NEEDLES 5/16") 30G X 8 MM MISC Inject 1 pen into the skin at bedtime. 12/26/12  Yes Campbell Riches, NP  fentaNYL (DURAGESIC - DOSED MCG/HR) 25 MCG/HR Place 1 patch onto the  skin every 3 (three) days.    Historical Provider, MD  HYDROmorphone (DILAUDID) 4 MG tablet Take 4 mg by mouth every 6 (six) hours as needed for pain.    Historical Provider, MD  omeprazole (PRILOSEC) 20 MG capsule Take 20 mg by mouth 2 (two) times daily.    Historical Provider, MD  promethazine (PHENERGAN) 25 MG tablet Take 1 tablet (25 mg total) by mouth every 6 (six) hours as needed for nausea. Do not take with Zofran 12/25/12   Campbell Riches, NP    Results for orders placed during the hospital encounter of 01/08/13 (from the past 48 hour(s))  CBC WITH DIFFERENTIAL     Status: None   Collection Time    01/08/13  5:50 PM      Result Value Range   WBC 6.7  4.0 - 10.5 K/uL   RBC 4.10  3.87 - 5.11 MIL/uL   Hemoglobin 13.3  12.0 - 15.0 g/dL   HCT 16.1  09.6 - 04.5 %   MCV 94.4  78.0 - 100.0 fL   MCH 32.4  26.0 - 34.0 pg   MCHC 34.4  30.0 - 36.0 g/dL   RDW 40.9  81.1 - 91.4 %   Platelets 195  150 - 400 K/uL   Neutrophils Relative 69  43 - 77 %  Neutro Abs 4.6  1.7 - 7.7 K/uL   Lymphocytes Relative 27  12 - 46 %   Lymphs Abs 1.8  0.7 - 4.0 K/uL   Monocytes Relative 4  3 - 12 %   Monocytes Absolute 0.3  0.1 - 1.0 K/uL   Eosinophils Relative 0  0 - 5 %   Eosinophils Absolute 0.0  0.0 - 0.7 K/uL   Basophils Relative 0  0 - 1 %   Basophils Absolute 0.0  0.0 - 0.1 K/uL  BASIC METABOLIC PANEL     Status: Abnormal   Collection Time    01/08/13  5:50 PM      Result Value Range   Sodium 128 (*) 135 - 145 mEq/L   Potassium 4.9  3.5 - 5.1 mEq/L   Chloride 95 (*) 96 - 112 mEq/L   CO2 22  19 - 32 mEq/L   Glucose, Bld 575 (*) 70 - 99 mg/dL   Comment: CRITICAL RESULT CALLED TO, READ BACK BY AND VERIFIED WITH:     HILL,J ON 01/08/13 AT 1825 BY LOY,C   BUN 8  6 - 23 mg/dL   Creatinine, Ser 1.61 (*) 0.50 - 1.10 mg/dL   Calcium 8.9  8.4 - 09.6 mg/dL   GFR calc non Af Amer >90  >90 mL/min   GFR calc Af Amer >90  >90 mL/min   Comment:            The eGFR has been calculated     using the CKD  EPI equation.     This calculation has not been     validated in all clinical     situations.     eGFR's persistently     <90 mL/min signify     possible Chronic Kidney Disease.  APTT     Status: None   Collection Time    01/08/13  5:50 PM      Result Value Range   aPTT 31  24 - 37 seconds  PROTIME-INR     Status: None   Collection Time    01/08/13  5:50 PM      Result Value Range   Prothrombin Time 14.4  11.6 - 15.2 seconds   INR 1.14  0.00 - 1.49  GLUCOSE, CAPILLARY     Status: Abnormal   Collection Time    01/08/13  8:31 PM      Result Value Range   Glucose-Capillary 325 (*) 70 - 99 mg/dL  GLUCOSE, CAPILLARY     Status: Abnormal   Collection Time    01/08/13  9:49 PM      Result Value Range   Glucose-Capillary 231 (*) 70 - 99 mg/dL  GLUCOSE, CAPILLARY     Status: Abnormal   Collection Time    01/08/13 11:05 PM      Result Value Range   Glucose-Capillary 117 (*) 70 - 99 mg/dL  GLUCOSE, CAPILLARY     Status: Abnormal   Collection Time    01/09/13 12:13 AM      Result Value Range   Glucose-Capillary 111 (*) 70 - 99 mg/dL  GLUCOSE, CAPILLARY     Status: Abnormal   Collection Time    01/09/13  2:24 AM      Result Value Range   Glucose-Capillary 164 (*) 70 - 99 mg/dL   Comment 1 Documented in Chart     Comment 2 Notify RN    MRSA PCR SCREENING     Status: None  Collection Time    01/09/13  2:44 AM      Result Value Range   MRSA by PCR NEGATIVE  NEGATIVE   Comment:            The GeneXpert MRSA Assay (FDA     approved for NASAL specimens     only), is one component of a     comprehensive MRSA colonization     surveillance program. It is not     intended to diagnose MRSA     infection nor to guide or     monitor treatment for     MRSA infections.  BASIC METABOLIC PANEL     Status: Abnormal   Collection Time    01/09/13  4:24 AM      Result Value Range   Sodium 135  135 - 145 mEq/L   Potassium 3.6  3.5 - 5.1 mEq/L   Chloride 104  96 - 112 mEq/L   CO2 23   19 - 32 mEq/L   Glucose, Bld 174 (*) 70 - 99 mg/dL   BUN 10  6 - 23 mg/dL   Creatinine, Ser 1.61 (*) 0.50 - 1.10 mg/dL   Calcium 8.1 (*) 8.4 - 10.5 mg/dL   GFR calc non Af Amer >90  >90 mL/min   GFR calc Af Amer >90  >90 mL/min   Comment:            The eGFR has been calculated     using the CKD EPI equation.     This calculation has not been     validated in all clinical     situations.     eGFR's persistently     <90 mL/min signify     possible Chronic Kidney Disease.  CBC     Status: Abnormal   Collection Time    01/09/13  4:24 AM      Result Value Range   WBC 6.9  4.0 - 10.5 K/uL   RBC 3.76 (*) 3.87 - 5.11 MIL/uL   Hemoglobin 12.3  12.0 - 15.0 g/dL   HCT 09.6 (*) 04.5 - 40.9 %   MCV 93.6  78.0 - 100.0 fL   MCH 32.7  26.0 - 34.0 pg   MCHC 34.9  30.0 - 36.0 g/dL   RDW 81.1  91.4 - 78.2 %   Platelets 187  150 - 400 K/uL  GLUCOSE, CAPILLARY     Status: Abnormal   Collection Time    01/09/13  7:47 AM      Result Value Range   Glucose-Capillary 200 (*) 70 - 99 mg/dL   Comment 1 Hypo Protocol     Comment 2 Documented in Chart     Comment 3 Notify RN      Ct Head Wo Contrast  01/09/2013  *RADIOLOGY REPORT*  Clinical Data: Right frontal supraorbital headache with decreased and blurred vision in the right eye.  Subarachnoid hemorrhage.  CT HEAD WITHOUT CONTRAST  Technique:  Contiguous axial images were obtained from the base of the skull through the vertex without contrast.  Comparison: 01/08/2013.  Findings: Previously seen area of high attenuation in the right middle cranial fossa is no longer identified.  However, there is residual linear hyper attenuation within the sulci of the right frontal lobe (images 13 14).  No evidence of acute infarct, mass lesion, mass effect or hydrocephalus.  Probable dilated perivascular space in the left basal ganglia.  Visualized portions of the paranasal sinuses and mastoid  air cells are clear.  No fracture. Resolving right supraorbital soft tissue  hematoma.  IMPRESSION:  1.  Resolving subarachnoid hemorrhage with trace residual blood in the right frontal lobe sulci. 2.  Resolving right supraorbital soft tissue hematoma.   Original Report Authenticated By: Leanna Battles, M.D.    Ct Head Wo Contrast  01/08/2013  *RADIOLOGY REPORT*  Clinical Data:  Trauma, fell onto concrete curb, right supraorbital hematoma, headache, blurred vision right eye, no loss of consciousness, cervical soreness  CT HEAD WITHOUT CONTRAST CT CERVICAL SPINE WITHOUT CONTRAST  Technique:  Multidetector CT imaging of the head and cervical spine was performed following the standard protocol without intravenous contrast.  Multiplanar CT image reconstructions of the cervical spine were also generated.  Comparison:  None  CT HEAD  Findings: Normal ventricular morphology. No midline shift or mass effect. High attenuation is identified at several sulci in the right frontoparietal region compatible with subarachnoid hemorrhage. No intraparenchymal or intraventricular hemorrhage identified. No additional extra-axial collections. Additional small amount of subarachnoid blood at inferior right sylvian fissure. Right periorbital supraorbital hematoma. Skull intact.  IMPRESSION: Small foci of subarachnoid hemorrhage identified at the right frontoparietal region and at the base of the right sylvian fissure. Right periorbital/supraorbital hematoma. No additional acute intracranial abnormalities.  CT CERVICAL SPINE  Findings: Visualized skull base intact. Tiny nonspecific bilateral thyroid nodules. Prevertebral soft tissues normal thickness. Vertebral body and disc space heights maintained. No acute fracture, subluxation or bone destruction. Minimal scattered facet degenerative changes. Lung apices clear.  IMPRESSION: No acute cervical spine abnormalities. Tiny bilateral nonspecific thyroid nodules. Sub-centimeter thyroid nodule(s) noted, too small to characterize, but most likely benign in the absence  of known clinical risk factors for thyroid carcinoma.  Critical Value/emergent results were called by telephone at the time of interpretation on 01/08/2013 at 1707 hours to Dr. Hyacinth Meeker, who verbally acknowledged these results.   Original Report Authenticated By: Ulyses Southward, M.D.    Ct Cervical Spine Wo Contrast  01/08/2013  *RADIOLOGY REPORT*  Clinical Data:  Trauma, fell onto concrete curb, right supraorbital hematoma, headache, blurred vision right eye, no loss of consciousness, cervical soreness  CT HEAD WITHOUT CONTRAST CT CERVICAL SPINE WITHOUT CONTRAST  Technique:  Multidetector CT imaging of the head and cervical spine was performed following the standard protocol without intravenous contrast.  Multiplanar CT image reconstructions of the cervical spine were also generated.  Comparison:  None  CT HEAD  Findings: Normal ventricular morphology. No midline shift or mass effect. High attenuation is identified at several sulci in the right frontoparietal region compatible with subarachnoid hemorrhage. No intraparenchymal or intraventricular hemorrhage identified. No additional extra-axial collections. Additional small amount of subarachnoid blood at inferior right sylvian fissure. Right periorbital supraorbital hematoma. Skull intact.  IMPRESSION: Small foci of subarachnoid hemorrhage identified at the right frontoparietal region and at the base of the right sylvian fissure. Right periorbital/supraorbital hematoma. No additional acute intracranial abnormalities.  CT CERVICAL SPINE  Findings: Visualized skull base intact. Tiny nonspecific bilateral thyroid nodules. Prevertebral soft tissues normal thickness. Vertebral body and disc space heights maintained. No acute fracture, subluxation or bone destruction. Minimal scattered facet degenerative changes. Lung apices clear.  IMPRESSION: No acute cervical spine abnormalities. Tiny bilateral nonspecific thyroid nodules. Sub-centimeter thyroid nodule(s) noted, too small  to characterize, but most likely benign in the absence of known clinical risk factors for thyroid carcinoma.  Critical Value/emergent results were called by telephone at the time of interpretation on 01/08/2013 at 1707 hours to Dr. Hyacinth Meeker,  who verbally acknowledged these results.   Original Report Authenticated By: Ulyses Southward, M.D.     Review of Systems: The patient denies anorexia, fever, weight loss,decreased hearing, hoarseness, chest pain, syncope, dyspnea on exertion, peripheral edema, hemoptysis, abdominal pain, melena, hematochezia, severe indigestion/heartburn, hematuria, incontinence, genital sores, muscle weakness, suspicious skin lesions, transient blindness, difficulty walking, depression, unusual weight change, abnormal bleeding, enlarged lymph nodes, angioedema, and breast masses.   PE Blood pressure 103/73, pulse 103, temperature 97.9 F (36.6 C), temperature source Oral, resp. rate 11, height 5\' 3"  (1.6 m), weight 48.8 kg (107 lb 9.4 oz), SpO2 92.00%. AAOx3  - moves all 4 well - CN II-XII intact  Assessment/Plan: Resolving traumatic SAH -  No further neurosurgical intervention needed  [name] [name] R, MD 01/09/2013, 9:50 AM

## 2013-01-09 NOTE — Progress Notes (Addendum)
Pt admitted to [facility]303 from [facility].  Pt complains of headache and nausea, vital signs stable, neuro intact.  Oriented pt to unit.  Will continue to monitor.    -Maximino Greenland RN

## 2013-01-21 ENCOUNTER — Encounter: Payer: Self-pay | Admitting: *Deleted

## 2013-01-22 ENCOUNTER — Ambulatory Visit (INDEPENDENT_AMBULATORY_CARE_PROVIDER_SITE_OTHER): Payer: Medicare Other | Admitting: Family Medicine

## 2013-01-22 ENCOUNTER — Encounter: Payer: Self-pay | Admitting: Family Medicine

## 2013-01-22 VITALS — BP 104/80 | HR 70 | Ht 64.25 in | Wt 109.0 lb

## 2013-01-22 DIAGNOSIS — Z8719 Personal history of other diseases of the digestive system: Secondary | ICD-10-CM | POA: Diagnosis not present

## 2013-01-22 DIAGNOSIS — I609 Nontraumatic subarachnoid hemorrhage, unspecified: Secondary | ICD-10-CM

## 2013-01-22 DIAGNOSIS — E1069 Type 1 diabetes mellitus with other specified complication: Secondary | ICD-10-CM | POA: Insufficient documentation

## 2013-01-22 DIAGNOSIS — T148XXA Other injury of unspecified body region, initial encounter: Secondary | ICD-10-CM

## 2013-01-22 DIAGNOSIS — Z79899 Other long term (current) drug therapy: Secondary | ICD-10-CM

## 2013-01-22 DIAGNOSIS — E119 Type 2 diabetes mellitus without complications: Secondary | ICD-10-CM | POA: Insufficient documentation

## 2013-01-22 DIAGNOSIS — E782 Mixed hyperlipidemia: Secondary | ICD-10-CM

## 2013-01-22 DIAGNOSIS — Z0000 Encounter for general adult medical examination without abnormal findings: Secondary | ICD-10-CM

## 2013-01-22 NOTE — Progress Notes (Signed)
  Subjective:    Patient ID: [name] [name], female    DOB: [birth year], 42 y.o.   MRN: [id number]  Fall   patient arrives office for evaluation and discussion. She is accompanied by her sister. We last saw her for chronic visit last summer, for diabetes, where she was advised to followup in several months in that regard. She did not make it back in for that diabetes followup visit. Her visit today is primarily for hospital followup reasons. She continues to see a specialist because of her challenges with chronic pancreatitis. Recently she took a fall. She struck her head. No true loss of consciousness. Workup revealed subarachnoid hemorrhage. Patient's been experiencing headaches with this understandably. Although the headaches are now better. She was admitted to the hospital. A repeat CT scan showed improvement.  Patient tries to watch her diabetic diet, though does not always succeed.  Patient has history of chronic pain, she sees Dr. Laurian Brim for this. Patient reports her reflux is fairly stable on the omeprazole. Often has spells of nausea, the Phenergan does help this.  Review of Systems No loss of function. No focal neurological symptoms. Headache improving. Facial pain improving. ROS otherwise negative.    Objective:   Physical Exam Alert no acute distress. Clear hematoma in the face. Neck supple. Lungs clear. Heart regular rate rhythm. Ankles without edema. No focal neurological deficits.       Assessment & Plan:  Impression #1 subarachnoid hemorrhage clinically improved. Improved on last scan. #2 facial contusion. #3 chronic reflux. #4 chronic GI concerns followed by the specialist. #5 chronic pain. On both fentanyl patch and Dilaudid tablets. Patient cautioned not to exceed her dosage. #6 type 2 diabetes with failure to followup at last visit. Plan appropriate blood work. Diabetes visit alone scheduled for next month. Symptomatic care discussed. Easily 35 minutes spent most in  discussion. WSL

## 2013-01-22 NOTE — Patient Instructions (Signed)
Please check sugar every morning--bring in log of results plus insulin usage at the next visit.

## 2013-02-11 ENCOUNTER — Ambulatory Visit: Payer: Medicare Other | Admitting: Family Medicine

## 2013-02-19 ENCOUNTER — Ambulatory Visit: Payer: Medicare Other | Admitting: Family Medicine

## 2013-03-09 ENCOUNTER — Encounter (HOSPITAL_COMMUNITY): Payer: Self-pay | Admitting: *Deleted

## 2013-03-09 ENCOUNTER — Emergency Department (HOSPITAL_COMMUNITY): Payer: Medicare Other

## 2013-03-09 ENCOUNTER — Emergency Department (HOSPITAL_COMMUNITY)
Admission: EM | Admit: 2013-03-09 | Discharge: 2013-03-09 | Disposition: A | Discharge status: Home / Self Care | Payer: Medicare Other | Attending: Emergency Medicine | Admitting: Emergency Medicine

## 2013-03-09 DIAGNOSIS — G8929 Other chronic pain: Secondary | ICD-10-CM | POA: Insufficient documentation

## 2013-03-09 DIAGNOSIS — F411 Generalized anxiety disorder: Secondary | ICD-10-CM | POA: Insufficient documentation

## 2013-03-09 DIAGNOSIS — K5900 Constipation, unspecified: Secondary | ICD-10-CM | POA: Insufficient documentation

## 2013-03-09 DIAGNOSIS — Z79899 Other long term (current) drug therapy: Secondary | ICD-10-CM | POA: Insufficient documentation

## 2013-03-09 DIAGNOSIS — Z87442 Personal history of urinary calculi: Secondary | ICD-10-CM | POA: Diagnosis not present

## 2013-03-09 DIAGNOSIS — Z8639 Personal history of other endocrine, nutritional and metabolic disease: Secondary | ICD-10-CM | POA: Insufficient documentation

## 2013-03-09 DIAGNOSIS — Z794 Long term (current) use of insulin: Secondary | ICD-10-CM | POA: Diagnosis not present

## 2013-03-09 DIAGNOSIS — Z8719 Personal history of other diseases of the digestive system: Secondary | ICD-10-CM | POA: Diagnosis not present

## 2013-03-09 DIAGNOSIS — E119 Type 2 diabetes mellitus without complications: Secondary | ICD-10-CM | POA: Insufficient documentation

## 2013-03-09 DIAGNOSIS — R112 Nausea with vomiting, unspecified: Secondary | ICD-10-CM | POA: Insufficient documentation

## 2013-03-09 DIAGNOSIS — F319 Bipolar disorder, unspecified: Secondary | ICD-10-CM | POA: Insufficient documentation

## 2013-03-09 DIAGNOSIS — R1033 Periumbilical pain: Secondary | ICD-10-CM | POA: Diagnosis not present

## 2013-03-09 DIAGNOSIS — Z862 Personal history of diseases of the blood and blood-forming organs and certain disorders involving the immune mechanism: Secondary | ICD-10-CM | POA: Diagnosis not present

## 2013-03-09 DIAGNOSIS — Z9089 Acquired absence of other organs: Secondary | ICD-10-CM | POA: Diagnosis not present

## 2013-03-09 DIAGNOSIS — Z90710 Acquired absence of both cervix and uterus: Secondary | ICD-10-CM | POA: Diagnosis not present

## 2013-03-09 DIAGNOSIS — G43909 Migraine, unspecified, not intractable, without status migrainosus: Secondary | ICD-10-CM | POA: Diagnosis not present

## 2013-03-09 DIAGNOSIS — R109 Unspecified abdominal pain: Secondary | ICD-10-CM | POA: Diagnosis not present

## 2013-03-09 LAB — COMPREHENSIVE METABOLIC PANEL
ALT: 53 U/L — ABNORMAL HIGH (ref 0–35)
AST: 58 U/L — ABNORMAL HIGH (ref 0–37)
CO2: 31 mEq/L (ref 19–32)
Calcium: 8.9 mg/dL (ref 8.4–10.5)
GFR calc non Af Amer: >90 mL/min (ref >90)
Sodium: 137 mEq/L (ref 135–145)
Total Protein: 6.7 g/dL (ref 6.0–8.3)

## 2013-03-09 LAB — CBC WITH DIFFERENTIAL/PLATELET
Basophils Absolute: 0 10*3/uL (ref 0.0–0.1)
Eosinophils Relative: 1 % (ref 0–5)
Lymphocytes Relative: 34 % (ref 12–46)
MCV: 92.4 fL (ref 78.0–100.0)
Neutrophils Relative %: 59 % (ref 43–77)
Platelets: 149 10*3/uL — ABNORMAL LOW (ref 150–400)
RDW: 12.1 % (ref 11.5–15.5)
WBC: 4.9 10*3/uL (ref 4.0–10.5)

## 2013-03-09 LAB — AMYLASE: Amylase: 26 U/L (ref 0–105)

## 2013-03-09 MED ORDER — HYDROMORPHONE HCL PF 1 MG/ML IJ SOLN
INTRAMUSCULAR | Status: AC
  Administered 2013-03-09: 1 mg via INTRAVENOUS
  Filled 2013-03-09: qty 1

## 2013-03-09 MED ORDER — PROMETHAZINE HCL 25 MG PO TABS: 25.0000 mg | ORAL_TABLET | Freq: Four times a day (QID) | ORAL | Status: DC | PRN

## 2013-03-09 MED ORDER — ONDANSETRON HCL 4 MG/2ML IJ SOLN
INTRAMUSCULAR | Status: AC
  Administered 2013-03-09: 4 mg via INTRAVENOUS
  Filled 2013-03-09: qty 2

## 2013-03-09 MED ORDER — PROMETHAZINE HCL 25 MG/ML IJ SOLN: 25.0000 mg | Freq: Once | INTRAMUSCULAR | Status: AC

## 2013-03-09 MED ORDER — HYDROMORPHONE HCL PF 2 MG/ML IJ SOLN
INTRAMUSCULAR | Status: AC
  Administered 2013-03-09: 2 mg via INTRAVENOUS
  Filled 2013-03-09: qty 1

## 2013-03-09 MED ORDER — HYDROMORPHONE HCL PF 1 MG/ML IJ SOLN: 1.0000 mg | Freq: Once | INTRAMUSCULAR | Status: AC

## 2013-03-09 MED ORDER — MORPHINE SULFATE 4 MG/ML IJ SOLN
6.0000 mg | Freq: Once | INTRAMUSCULAR | Status: AC
  Administered 2013-03-09: 6 mg via INTRAVENOUS
  Filled 2013-03-09: qty 2

## 2013-03-09 MED ORDER — HYDROMORPHONE HCL PF 2 MG/ML IJ SOLN: 2.0000 mg | Freq: Once | INTRAMUSCULAR | Status: AC

## 2013-03-09 MED ORDER — ONDANSETRON HCL 4 MG/2ML IJ SOLN: 4.0000 mg | Freq: Once | INTRAMUSCULAR | Status: AC

## 2013-03-09 MED ORDER — SODIUM CHLORIDE 0.9 % IV BOLUS (SEPSIS)
1000.0000 mL | Freq: Once | INTRAVENOUS | Status: AC
  Administered 2013-03-09: 1000 mL via INTRAVENOUS

## 2013-03-09 MED ORDER — PROMETHAZINE HCL 25 MG/ML IJ SOLN
INTRAMUSCULAR | Status: AC
  Administered 2013-03-09: 25 mg via INTRAVENOUS
  Filled 2013-03-09: qty 1

## 2013-03-09 NOTE — ED Notes (Signed)
Placed on Lovilia O2 and pulse oximeter to monitor after dosing w/Dilaudid and Phenergan.

## 2013-03-09 NOTE — ED Notes (Signed)
Upper abd pain for 2 days,n/v, no diarrhea. No fever.

## 2013-03-09 NOTE — ED Notes (Signed)
Patient requesting Phenergan and stating pain is no better.

## 2013-03-09 NOTE — ED Notes (Signed)
C/o upper abd pain x 2 days; reports dilaudid is not helping to relieve pain (pt has Rx for this).  C/o n/v last vomited this morning. Report pain sharp, constant. Hx of pancreatitis.

## 2013-03-09 NOTE — ED Provider Notes (Signed)
History     This chart was scribed for [name] Lennert, MD, MD by [name] [name], ED Scribe. The patient was seen in room APA07/APA07 and the patient's care was started at 3:41 PM.   CSN: [id number]  Arrival date & time 03/09/13  1453      Chief Complaint  Patient presents with  . Abdominal Pain     Patient is a 42 y.o. female presenting with abdominal pain. The history is provided by the patient and medical records. No language interpreter was used.  Abdominal Pain This is a recurrent problem. The current episode started 2 days ago. The problem occurs constantly. The problem has not changed since onset.Associated symptoms include abdominal pain. Pertinent negatives include no chest pain and no headaches. Nothing aggravates the symptoms. Nothing relieves the symptoms. She has tried nothing for the symptoms.   HPI Comments: [name] [name] is a 42 y.o. female who presents to the Emergency Department complaining of constant, moderate periumbilical abdominal pain onset 2 days ago. Pt reports that she thinks that current symptoms might be due to pancreatitis. She reports having nausea and vomiting. Pt denies fever, blood in vomit, chills, diarrhea, weakness, cough, SOB and any other pain.  GI is at [name] [name] PCP is [name] [name]  Past Medical History  Diagnosis Date  . Pancreatitis   . Diabetes mellitus   . Esophageal ulcer 1997  . Depression   . Anxiety   . Migraine headache   . Bipolar disorder   . Glucose intolerance (impaired glucose tolerance)   . Chronic pancreatitis   . Kidney stones     Past Surgical History  Procedure Laterality Date  . Whipple procedure    . Intestinal blockage    . Abdominal surgery    . Abdominal hysterectomy    . Cholecystectomy    . Cesarean section    . Colonoscopy      History reviewed. No pertinent family history.  History  Substance Use Topics  . Smoking status: Never Smoker   . Smokeless tobacco: Not on file  . Alcohol Use: No    OB  History   Grav Para Term Preterm Abortions TAB SAB Ect Mult Living                  Review of Systems  Constitutional: Negative for appetite change and fatigue.  HENT: Negative for congestion, sinus pressure and ear discharge.   Eyes: Negative for discharge.  Respiratory: Negative for cough.   Cardiovascular: Negative for chest pain.  Gastrointestinal: Positive for nausea, vomiting and abdominal pain. Negative for diarrhea.  Genitourinary: Negative for frequency and hematuria.  Musculoskeletal: Negative for back pain.  Skin: Negative for rash.  Neurological: Negative for seizures and headaches.  Psychiatric/Behavioral: Negative for hallucinations.    Allergies  Buspar and Metoclopramide hcl  Home Medications   Current Outpatient Rx  Name  Route  Sig  Dispense  Refill  . docusate sodium (COLACE) 100 MG capsule   Oral   Take 1 capsule (100 mg total) by mouth every 12 (twelve) hours.   60 capsule   0   . dronabinol (MARINOL) 5 MG capsule   Oral   Take 5 mg by mouth 4 (four) times daily as needed (for appetite).          . DULoxetine (CYMBALTA) 60 MG capsule   Oral   Take 60 mg by mouth daily.         . fentaNYL (DURAGESIC - DOSED MCG/HR)  25 MCG/HR   Transdermal   Place 1 patch onto the skin every 3 (three) days.         Marland Kitchen gabapentin (NEURONTIN) 800 MG tablet   Oral   Take 800 mg by mouth every 8 (eight) hours.         Marland Kitchen HYDROmorphone (DILAUDID) 4 MG tablet   Oral   Take 4 mg by mouth every 6 (six) hours as needed for pain.         Marland Kitchen ibuprofen (ADVIL,MOTRIN) 800 MG tablet   Oral   Take 800 mg by mouth every 6 (six) hours as needed for pain.         Marland Kitchen insulin aspart (NOVOLOG) 100 UNIT/ML injection   Subcutaneous   Inject 0-5 Units into the skin at bedtime.   1 vial      . insulin glargine (LANTUS) 100 UNIT/ML injection   Subcutaneous   Inject 10 Units into the skin at bedtime.         . Insulin Pen Needle (PEN NEEDLES 5/16") 30G X 8 MM  MISC   Subcutaneous   Inject 1 pen into the skin at bedtime.   100 each   11   . omeprazole (PRILOSEC) 20 MG capsule   Oral   Take 20 mg by mouth 2 (two) times daily.         . promethazine (PHENERGAN) 25 MG tablet   Oral   Take 1 tablet (25 mg total) by mouth every 6 (six) hours as needed for nausea. Do not take with Zofran   30 tablet   0     BP 123/95  Pulse 98  Temp(Src) 98.9 F (37.2 C) (Oral)  Resp 20  Ht 5\' 3"  (1.6 m)  Wt 109 lb (49.442 kg)  BMI 19.31 kg/m2  SpO2 98%  Physical Exam  Nursing note and vitals reviewed. Constitutional: She is oriented to person, place, and time. She appears well-developed.  HENT:  Head: Normocephalic.  Eyes: Conjunctivae and EOM are normal. No scleral icterus.  Neck: Neck supple. No thyromegaly present.  Cardiovascular: Normal rate and regular rhythm.  Exam reveals no gallop and no friction rub.   No murmur heard. Pulmonary/Chest: No stridor. She has no wheezes. She has no rales. She exhibits no tenderness.  Abdominal: She exhibits no distension. There is tenderness (mild) in the periumbilical area. There is no rebound.  Musculoskeletal: Normal range of motion. She exhibits no edema.  Lymphadenopathy:    She has no cervical adenopathy.  Neurological: She is oriented to person, place, and time. Coordination normal.  Skin: No rash noted. No erythema.  Psychiatric: She has a normal mood and affect. Her behavior is normal.    ED Course  Procedures (including critical care time) DIAGNOSTIC STUDIES: Oxygen Saturation is 98% on room air, normal by my interpretation.    COORDINATION OF CARE: 3:44 PM Discussed ED treatment with pt and pt agrees.     Labs Reviewed  AMYLASE  CBC WITH DIFFERENTIAL  COMPREHENSIVE METABOLIC PANEL  LIPASE, BLOOD   No results found.   No diagnosis found.    MDM  Chronic abd pain with constipation   The chart was scribed for me under my direct supervision.  I personally performed the  history, physical, and medical decision making and all procedures in the evaluation of this patient.[name] Lennert, MD 03/09/13 2124

## 2013-03-16 DIAGNOSIS — Z79899 Other long term (current) drug therapy: Secondary | ICD-10-CM | POA: Diagnosis not present

## 2013-03-16 DIAGNOSIS — R109 Unspecified abdominal pain: Secondary | ICD-10-CM | POA: Diagnosis not present

## 2013-03-16 DIAGNOSIS — Z8719 Personal history of other diseases of the digestive system: Secondary | ICD-10-CM | POA: Diagnosis not present

## 2013-03-31 ENCOUNTER — Ambulatory Visit (INDEPENDENT_AMBULATORY_CARE_PROVIDER_SITE_OTHER): Payer: Medicare Other | Admitting: Family Medicine

## 2013-03-31 ENCOUNTER — Encounter: Payer: Self-pay | Admitting: Family Medicine

## 2013-03-31 VITALS — BP 100/70 | Temp 98.3°F | Ht 63.0 in | Wt 113.1 lb

## 2013-03-31 DIAGNOSIS — H6983 Other specified disorders of Eustachian tube, bilateral: Secondary | ICD-10-CM

## 2013-03-31 DIAGNOSIS — H6980 Other specified disorders of Eustachian tube, unspecified ear: Secondary | ICD-10-CM | POA: Diagnosis not present

## 2013-03-31 DIAGNOSIS — G43909 Migraine, unspecified, not intractable, without status migrainosus: Secondary | ICD-10-CM

## 2013-03-31 MED ORDER — TOPIRAMATE 25 MG PO TABS: 25.0000 mg | ORAL_TABLET | Freq: Every day | ORAL | Status: DC

## 2013-03-31 MED ORDER — ZOLMITRIPTAN 5 MG PO TABS: 5.0000 mg | ORAL_TABLET | ORAL | Status: DC | PRN

## 2013-03-31 MED ORDER — PROMETHAZINE HCL 25 MG PO TABS: 25.0000 mg | ORAL_TABLET | Freq: Three times a day (TID) | ORAL | Status: DC | PRN

## 2013-03-31 MED ORDER — FLUTICASONE PROPIONATE 50 MCG/ACT NA SUSP: 2.0000 | Freq: Every day | NASAL | Status: DC

## 2013-03-31 NOTE — Progress Notes (Signed)
  Subjective:    Patient ID: [name] [name], female    DOB: 05-20-1971, 42 y.o.   MRN: [id number]  Otalgia  There is pain in both ears. This is a new problem. The current episode started more than 1 month ago. The problem occurs constantly. The problem has been unchanged. There has been no fever. The pain is at a severity of 3/10. The pain is mild. Associated symptoms include headaches. She has tried nothing for the symptoms. The treatment provided no relief.   patient initially comes in complaining of ear pain but then she relates is more of a fullness in her ears has difficult time hearing worse on the left than the right a little bit head congestion in addition that she has frequent headaches she states is ever since her pain management doctor reduced her dialogue it she relates headaches once or twice a day throbbing slight nausea with it no vomiting no blurred vision does not wake her up at night yet no unilateral numbness or weakness no high fevers with it. PMH migraines family history benign. Social does not smoke    Review of Systems  HENT: Positive for ear pain.   Neurological: Positive for headaches.   see above     Objective:   Physical Exam Eardrums normal neck is supple lungs are clear heart is regular and not rest for distress extremities no edema skin warm dry neurologic grossly normal       Assessment & Plan:  #1 headaches-sounds migraines. I recommend Zomig one at the first sign of headache repeat 2 hours later if need be, Topamax 25 mg each bedtime followup in 4-6 weeks to see a headaches are doing #2 eustachian tube dysfunction-Flonase spray if ongoing troubles I recommend ENT she will call us back in several weeks if not doing better 25 minutes spent with patient [phone]

## 2013-04-01 ENCOUNTER — Telehealth: Payer: Self-pay | Admitting: Family Medicine

## 2013-04-01 MED ORDER — PROMETHAZINE HCL 25 MG PO TABS: 25.0000 mg | ORAL_TABLET | Freq: Three times a day (TID) | ORAL | Status: DC | PRN

## 2013-04-01 NOTE — Telephone Encounter (Signed)
Resent in RX for phenergan to [facility].

## 2013-04-01 NOTE — Telephone Encounter (Signed)
Patient states pharmacy did not receive the order for promethazine (PHENERGAN) 25 MG tablet on Tuesday April 01, 2013.  She would like to know if this can be sent to pharmacy today?  Patient wants to let MD know that topiramate (TOPAMAX) 25 MG tablet is not covered by her insurance.  Please call patient if the Phenegran can be sent to pharmacy today.  Thanks

## 2013-04-01 NOTE — Telephone Encounter (Signed)
NTC- pt should ask pharmacy to send [place] notice from insur co why not covered so we can try to get approved

## 2013-04-02 ENCOUNTER — Telehealth: Payer: Self-pay | Admitting: *Deleted

## 2013-04-02 NOTE — Telephone Encounter (Signed)
Refill of phenergan sent to [facility]

## 2013-04-02 NOTE — Telephone Encounter (Signed)
Discussed with patient. Patient to have pharmacy fax over the denial so we can work on prior [place].

## 2013-04-18 ENCOUNTER — Other Ambulatory Visit: Payer: Self-pay | Admitting: Family Medicine

## 2013-04-25 ENCOUNTER — Encounter (HOSPITAL_COMMUNITY): Payer: Self-pay | Admitting: *Deleted

## 2013-04-25 ENCOUNTER — Emergency Department (HOSPITAL_COMMUNITY): Payer: Medicare Other

## 2013-04-25 ENCOUNTER — Emergency Department (HOSPITAL_COMMUNITY)
Admission: EM | Admit: 2013-04-25 | Discharge: 2013-04-25 | Disposition: A | Discharge status: Home / Self Care | Payer: Medicare Other | Attending: Emergency Medicine | Admitting: Emergency Medicine

## 2013-04-25 DIAGNOSIS — E1169 Type 2 diabetes mellitus with other specified complication: Secondary | ICD-10-CM | POA: Diagnosis not present

## 2013-04-25 DIAGNOSIS — R112 Nausea with vomiting, unspecified: Secondary | ICD-10-CM | POA: Insufficient documentation

## 2013-04-25 DIAGNOSIS — Z79899 Other long term (current) drug therapy: Secondary | ICD-10-CM | POA: Insufficient documentation

## 2013-04-25 DIAGNOSIS — F411 Generalized anxiety disorder: Secondary | ICD-10-CM | POA: Diagnosis not present

## 2013-04-25 DIAGNOSIS — Z87442 Personal history of urinary calculi: Secondary | ICD-10-CM | POA: Insufficient documentation

## 2013-04-25 DIAGNOSIS — Z794 Long term (current) use of insulin: Secondary | ICD-10-CM | POA: Insufficient documentation

## 2013-04-25 DIAGNOSIS — R109 Unspecified abdominal pain: Secondary | ICD-10-CM | POA: Diagnosis not present

## 2013-04-25 DIAGNOSIS — Z90710 Acquired absence of both cervix and uterus: Secondary | ICD-10-CM | POA: Diagnosis not present

## 2013-04-25 DIAGNOSIS — H539 Unspecified visual disturbance: Secondary | ICD-10-CM | POA: Insufficient documentation

## 2013-04-25 DIAGNOSIS — N390 Urinary tract infection, site not specified: Secondary | ICD-10-CM

## 2013-04-25 DIAGNOSIS — R739 Hyperglycemia, unspecified: Secondary | ICD-10-CM

## 2013-04-25 DIAGNOSIS — F3289 Other specified depressive episodes: Secondary | ICD-10-CM | POA: Insufficient documentation

## 2013-04-25 DIAGNOSIS — R1031 Right lower quadrant pain: Secondary | ICD-10-CM | POA: Diagnosis not present

## 2013-04-25 DIAGNOSIS — R197 Diarrhea, unspecified: Secondary | ICD-10-CM | POA: Insufficient documentation

## 2013-04-25 DIAGNOSIS — K7689 Other specified diseases of liver: Secondary | ICD-10-CM | POA: Diagnosis not present

## 2013-04-25 DIAGNOSIS — Z9089 Acquired absence of other organs: Secondary | ICD-10-CM | POA: Diagnosis not present

## 2013-04-25 DIAGNOSIS — G43909 Migraine, unspecified, not intractable, without status migrainosus: Secondary | ICD-10-CM | POA: Diagnosis not present

## 2013-04-25 DIAGNOSIS — E119 Type 2 diabetes mellitus without complications: Secondary | ICD-10-CM | POA: Diagnosis not present

## 2013-04-25 DIAGNOSIS — F329 Major depressive disorder, single episode, unspecified: Secondary | ICD-10-CM | POA: Insufficient documentation

## 2013-04-25 DIAGNOSIS — R0982 Postnasal drip: Secondary | ICD-10-CM | POA: Insufficient documentation

## 2013-04-25 DIAGNOSIS — M549 Dorsalgia, unspecified: Secondary | ICD-10-CM | POA: Diagnosis not present

## 2013-04-25 DIAGNOSIS — Z8719 Personal history of other diseases of the digestive system: Secondary | ICD-10-CM | POA: Diagnosis not present

## 2013-04-25 DIAGNOSIS — Z9889 Other specified postprocedural states: Secondary | ICD-10-CM | POA: Insufficient documentation

## 2013-04-25 DIAGNOSIS — N200 Calculus of kidney: Secondary | ICD-10-CM | POA: Diagnosis not present

## 2013-04-25 LAB — CBC WITH DIFFERENTIAL/PLATELET
Basophils Absolute: 0 10*3/uL (ref 0.0–0.1)
Basophils Relative: 0 % (ref 0–1)
Eosinophils Absolute: 0 10*3/uL (ref 0.0–0.7)
Hemoglobin: 12.5 g/dL (ref 12.0–15.0)
MCHC: 34 g/dL (ref 30.0–36.0)
Monocytes Relative: 5 % (ref 3–12)
Neutro Abs: 3.6 10*3/uL (ref 1.7–7.7)
Neutrophils Relative %: 62 % (ref 43–77)
Platelets: 208 10*3/uL (ref 150–400)
RDW: 12.9 % (ref 11.5–15.5)

## 2013-04-25 LAB — URINE MICROSCOPIC-ADD ON

## 2013-04-25 LAB — URINALYSIS, ROUTINE W REFLEX MICROSCOPIC
Ketones, ur: NEGATIVE mg/dL
Leukocytes, UA: NEGATIVE
Nitrite: NEGATIVE
Specific Gravity, Urine: 1.025 (ref 1.005–1.030)
Urobilinogen, UA: 0.2 mg/dL (ref 0.0–1.0)
pH: 6 (ref 5.0–8.0)

## 2013-04-25 LAB — GLUCOSE, CAPILLARY: Glucose-Capillary: 334 mg/dL — ABNORMAL HIGH (ref 70–99)

## 2013-04-25 LAB — BASIC METABOLIC PANEL
Chloride: 95 mEq/L — ABNORMAL LOW (ref 96–112)
GFR calc Af Amer: >90 mL/min (ref >90)
GFR calc non Af Amer: >90 mL/min (ref >90)
Potassium: 3.6 mEq/L (ref 3.5–5.1)
Sodium: 131 mEq/L — ABNORMAL LOW (ref 135–145)

## 2013-04-25 MED ORDER — CIPROFLOXACIN HCL 500 MG PO TABS: 500.0000 mg | ORAL_TABLET | Freq: Two times a day (BID) | ORAL | Status: DC

## 2013-04-25 MED ORDER — HYDROCODONE-ACETAMINOPHEN 5-325 MG PO TABS: 1.0000 | ORAL_TABLET | Freq: Four times a day (QID) | ORAL | Status: DC | PRN

## 2013-04-25 MED ORDER — LOPERAMIDE HCL 2 MG PO TABS: 2.0000 mg | ORAL_TABLET | Freq: Four times a day (QID) | ORAL | Status: DC | PRN

## 2013-04-25 MED ORDER — HYDROCODONE-ACETAMINOPHEN 5-325 MG PO TABS
1.0000 | ORAL_TABLET | Freq: Once | ORAL | Status: AC
  Administered 2013-04-25: 1 via ORAL
  Filled 2013-04-25: qty 1

## 2013-04-25 MED ORDER — ONDANSETRON 4 MG PO TBDP
4.0000 mg | ORAL_TABLET | Freq: Once | ORAL | Status: AC
  Administered 2013-04-25: 4 mg via ORAL
  Filled 2013-04-25: qty 1

## 2013-04-25 NOTE — ED Notes (Signed)
Pt c/o right flank pain, lower abd pain that started a few hours ago, pain is associated with n/v,

## 2013-04-25 NOTE — ED Provider Notes (Signed)
History    This chart was scribed for Shelda Jakes, MD by Leone Payor, ED Scribe. This patient was seen in room APA03/APA03 and the patient's care was started 1:36 PM.  CSN: [id number] Arrival date & time 04/25/13  1158  First MD Initiated Contact with Patient 04/25/13 1244     Chief Complaint  Patient presents with  . Abdominal Pain  . Flank Pain    The history is provided by the patient. No language interpreter was used.    HPI Comments: [name] [name] is a 42 y.o. female with past medical history of kidney stones (15 years ago), pancreatitis, DM who presents to the Emergency Department complaining of sudden onset, constant, unchanged right flank pain that immediately moved to RLQ and left abdomen onset 10am today. She has associated nausea and vomiting. Pt also reports having greater than 10 episodes per day for the past 3 days. Rates the pain as 7/10 currently and at max. Pt believes this pain is similar to prior kidney stone related pain which was about 14-15 years ago. Pt has h/o whipple procedure, abdominal hysterectomy, cholecystectomy.   PCP Dr. [name]   Past Medical History  Diagnosis Date  . Pancreatitis   . Diabetes mellitus   . Esophageal ulcer 1997  . Depression   . Anxiety   . Migraine headache   . Bipolar disorder   . Glucose intolerance (impaired glucose tolerance)   . Chronic pancreatitis   . Kidney stones    Past Surgical History  Procedure Laterality Date  . Whipple procedure    . Intestinal blockage    . Abdominal surgery    . Abdominal hysterectomy    . Cholecystectomy    . Cesarean section    . Colonoscopy     No family history on file. History  Substance Use Topics  . Smoking status: Never Smoker   . Smokeless tobacco: Not on file  . Alcohol Use: No   OB History   Grav Para Term Preterm Abortions TAB SAB Ect Mult Living                 Review of Systems  Constitutional: Negative for fever and chills.  HENT: Positive for  postnasal drip. Negative for sore throat, rhinorrhea and neck pain.   Eyes: Positive for visual disturbance.  Respiratory: Negative for cough and shortness of breath.   Cardiovascular: Negative for chest pain and leg swelling.  Gastrointestinal: Positive for abdominal pain and diarrhea. Negative for blood in stool.  Genitourinary: Positive for flank pain. Negative for dysuria and hematuria.  Musculoskeletal: Positive for back pain. Negative for joint swelling.  Skin: Negative for rash.  Neurological: Positive for headaches.  Hematological: Does not bruise/bleed easily.  Psychiatric/Behavioral: Negative for confusion.    Allergies  Buspar and Metoclopramide hcl  Home Medications   Current Outpatient Rx  Name  Route  Sig  Dispense  Refill  . DULoxetine (CYMBALTA) 60 MG capsule   Oral   Take 60 mg by mouth at bedtime.         . fluticasone (FLONASE) 50 MCG/ACT nasal spray   Nasal   Place 2 sprays into the nose daily.   16 g   5   . gabapentin (NEURONTIN) 800 MG tablet   Oral   Take 800 mg by mouth every 8 (eight) hours.         Marland Kitchen HYDROmorphone (DILAUDID) 2 MG tablet   Oral   Take 2 mg by  mouth 2 (two) times daily as needed for pain.         Marland Kitchen ibuprofen (ADVIL,MOTRIN) 800 MG tablet   Oral   Take 800 mg by mouth every 6 (six) hours as needed for pain.         Marland Kitchen insulin glargine (LANTUS) 100 UNIT/ML injection   Subcutaneous   Inject 10 Units into the skin at bedtime.         . insulin lispro (HUMALOG) 100 UNIT/ML injection   Subcutaneous   Inject 0-8 Units into the skin 3 (three) times daily before meals. Patient states she checks her blood sugar in the mornings         . omeprazole (PRILOSEC) 20 MG capsule   Oral   Take 20 mg by mouth 2 (two) times daily.         . promethazine (PHENERGAN) 25 MG tablet      TAKE ONE TABLET BY MOUTH EVERY 8 HOURS AS NEEDED FOR NAUSEA.   30 tablet   3   . zolpidem (AMBIEN) 10 MG tablet   Oral   Take 10 mg by mouth  at bedtime.         . dronabinol (MARINOL) 5 MG capsule   Oral   Take 5 mg by mouth 4 (four) times daily as needed (for appetite).          Marland Kitchen HYDROcodone-acetaminophen (NORCO/VICODIN) 5-325 MG per tablet   Oral   Take 1-2 tablets by mouth every 6 (six) hours as needed for pain.   14 tablet   0   . loperamide (IMODIUM A-D) 2 MG tablet   Oral   Take 1 tablet (2 mg total) by mouth 4 (four) times daily as needed for diarrhea or loose stools.   30 tablet   0   . ondansetron (ZOFRAN) 4 MG tablet   Oral   Take 4 mg by mouth every 8 (eight) hours as needed for nausea.          Marland Kitchen zolmitriptan (ZOMIG) 5 MG tablet   Oral   Take 1 tablet (5 mg total) by mouth as needed for migraine.   10 tablet   0    BP 124/77  Pulse 114  Temp(Src) 98.7 F (37.1 C) (Oral)  Resp 18  Ht 5\' 3"  (1.6 m)  Wt 110 lb (49.896 kg)  BMI 19.49 kg/m2  SpO2 96% Physical Exam  Nursing note and vitals reviewed. Constitutional: She is oriented to person, place, and time. She appears well-developed and well-nourished.  HENT:  Head: Normocephalic and atraumatic.  Eyes: Conjunctivae and EOM are normal. Pupils are equal, round, and reactive to light.  Neck: Normal range of motion. Neck supple.  Cardiovascular: Normal rate, regular rhythm and normal heart sounds.  Exam reveals no gallop and no friction rub.   No murmur heard. Pulmonary/Chest: Effort normal and breath sounds normal. No respiratory distress. She has no wheezes. She has no rales. She exhibits no tenderness.  Abdominal: Soft. Bowel sounds are normal. She exhibits no distension and no mass. There is tenderness. There is no rebound and no guarding.  Non tender on left abdomen. Has mild tenderness in epigastric region. Tender in RLQ  Musculoskeletal: Normal range of motion. She exhibits no edema.  No leg swelling.   Neurological: She is alert and oriented to person, place, and time.  Skin: Skin is warm and dry.  Psychiatric: She has a normal mood  and affect.    ED Course  Procedures (including critical care time)  DIAGNOSTIC STUDIES: Oxygen Saturation is 96% on RA, adequate by my interpretation.    COORDINATION OF CARE: 2:09 PM Discussed treatment plan with pt at bedside and pt agreed to plan.   Labs Reviewed  URINALYSIS, ROUTINE W REFLEX MICROSCOPIC - Abnormal; Notable for the following:    Glucose, UA >1000 (*)    Hgb urine dipstick SMALL (*)    All other components within normal limits  BASIC METABOLIC PANEL - Abnormal; Notable for the following:    Sodium 131 (*)    Chloride 95 (*)    Glucose, Bld 360 (*)    Creatinine, Ser 0.32 (*)    All other components within normal limits  URINE MICROSCOPIC-ADD ON - Abnormal; Notable for the following:    Squamous Epithelial / LPF FEW (*)    All other components within normal limits  GLUCOSE, CAPILLARY - Abnormal; Notable for the following:    Glucose-Capillary 334 (*)    All other components within normal limits  CBC WITH DIFFERENTIAL   Ct Abdomen Pelvis Wo Contrast  04/25/2013   *RADIOLOGY REPORT*  Clinical Data: abdominal pain and flank pain  CT ABDOMEN AND PELVIS WITHOUT CONTRAST  Technique:  Multidetector CT imaging of the abdomen and pelvis was performed following the standard protocol without intravenous contrast.  Comparison: 12/25/2012  Findings: The lung bases are clear.  No pleural effusion or pericardial effusion identified.   There is mild diffuse fatty infiltration of the liver parenchyma. Postoperative change compatible with Whipple procedure identified. Pneumobilia is identified compatible with biliary patency.  The body and tail of pancreas appear within normal limits.  Normal appearance of the spleen.  The adrenal glands are normal.  Normal appearance of the right kidney.  There is a 3 mm stone within the mid pole of left kidney, image 31/series 2.  There is no hydronephrosis or hydroureter identified.  No left-sided hydronephrosis or hydroureter.  The abdominal  aorta has a normal caliber.  There is no upper abdominal adenopathy.  No pelvic or inguinal adenopathy identified. Postoperative appearance of the stomach and proximal small bowel loops compatible with Whipple procedure.  The small bowel loops are prominent.  There is a moderate stool burden identified within the colon.  No evidence for bowel perforation.  No abscess formation.  Again noted is a large collateral vessel within the left abdomen which appears to originate from the splenic vein.  Review of the visualized osseous structures demonstrates no aggressive lytic or sclerotic bone lesions.  IMPRESSION: 1.  Nonobstructing left renal calculi. 2.  No evidence for hydronephrosis, hydroureter or ureterolithiasis. 3.  Postoperative change from Whipple procedure.  The small bowel loops appear prominent.  There is a moderate stool burden identified within the colon. 4.  Hepatic steatosis.   Original Report Authenticated By: Signa Kell, M.D.   Results for orders placed during the hospital encounter of 04/25/13  URINALYSIS, ROUTINE W REFLEX MICROSCOPIC      Result Value Range   Color, Urine YELLOW  YELLOW   APPearance CLEAR  CLEAR   Specific Gravity, Urine 1.025  1.005 - 1.030   pH 6.0  5.0 - 8.0   Glucose, UA >1000 (*) NEGATIVE mg/dL   Hgb urine dipstick SMALL (*) NEGATIVE   Bilirubin Urine NEGATIVE  NEGATIVE   Ketones, ur NEGATIVE  NEGATIVE mg/dL   Protein, ur NEGATIVE  NEGATIVE mg/dL   Urobilinogen, UA 0.2  0.0 - 1.0 mg/dL   Nitrite NEGATIVE  NEGATIVE  Leukocytes, UA NEGATIVE  NEGATIVE  CBC WITH DIFFERENTIAL      Result Value Range   WBC 5.8  4.0 - 10.5 K/uL   RBC 3.98  3.87 - 5.11 MIL/uL   Hemoglobin 12.5  12.0 - 15.0 g/dL   HCT 16.1  09.6 - 04.5 %   MCV 92.5  78.0 - 100.0 fL   MCH 31.4  26.0 - 34.0 pg   MCHC 34.0  30.0 - 36.0 g/dL   RDW 40.9  81.1 - 91.4 %   Platelets 208  150 - 400 K/uL   Neutrophils Relative % 62  43 - 77 %   Neutro Abs 3.6  1.7 - 7.7 K/uL   Lymphocytes Relative  32  12 - 46 %   Lymphs Abs 1.9  0.7 - 4.0 K/uL   Monocytes Relative 5  3 - 12 %   Monocytes Absolute 0.3  0.1 - 1.0 K/uL   Eosinophils Relative 0  0 - 5 %   Eosinophils Absolute 0.0  0.0 - 0.7 K/uL   Basophils Relative 0  0 - 1 %   Basophils Absolute 0.0  0.0 - 0.1 K/uL  BASIC METABOLIC PANEL      Result Value Range   Sodium 131 (*) 135 - 145 mEq/L   Potassium 3.6  3.5 - 5.1 mEq/L   Chloride 95 (*) 96 - 112 mEq/L   CO2 27  19 - 32 mEq/L   Glucose, Bld 360 (*) 70 - 99 mg/dL   BUN 8  6 - 23 mg/dL   Creatinine, Ser 7.82 (*) 0.50 - 1.10 mg/dL   Calcium 8.7  8.4 - 95.6 mg/dL   GFR calc non Af Amer >90  >90 mL/min   GFR calc Af Amer >90  >90 mL/min  URINE MICROSCOPIC-ADD ON      Result Value Range   Squamous Epithelial / LPF FEW (*) RARE   WBC, UA 3-6  <3 WBC/hpf   RBC / HPF 3-6  <3 RBC/hpf  GLUCOSE, CAPILLARY      Result Value Range   Glucose-Capillary 334 (*) 70 - 99 mg/dL        1. Abdominal pain   2. Diarrhea   3. Hyperglycemia     MDM  Patient with known diabetes. Patient with right flank pain that she felt was consistent with a kidney stone CT scan without any evidence of stone. No other complicating factors found on the CAT scan. Patient in no acute distress no leukocytosis no electrolyte abnormalities. Questionable early urinary tract infection will treat with antibiotic.  I personally performed the services described in this documentation, which was scribed in my presence. The recorded information has been reviewed and is accurate.    Shelda Jakes, MD 04/25/13 1520

## 2013-05-13 DIAGNOSIS — Z79899 Other long term (current) drug therapy: Secondary | ICD-10-CM | POA: Diagnosis not present

## 2013-05-13 DIAGNOSIS — R109 Unspecified abdominal pain: Secondary | ICD-10-CM | POA: Diagnosis not present

## 2013-05-13 DIAGNOSIS — Z8719 Personal history of other diseases of the digestive system: Secondary | ICD-10-CM | POA: Diagnosis not present

## 2013-05-19 ENCOUNTER — Encounter (HOSPITAL_COMMUNITY): Payer: Self-pay | Admitting: *Deleted

## 2013-05-19 ENCOUNTER — Emergency Department (HOSPITAL_COMMUNITY): Payer: Medicare Other

## 2013-05-19 ENCOUNTER — Inpatient Hospital Stay (HOSPITAL_COMMUNITY)
Admission: EM | Admit: 2013-05-19 | Discharge: 2013-05-21 | DRG: 392 | Disposition: A | Discharge status: Home / Self Care | Payer: Medicare Other | Attending: Internal Medicine | Admitting: Internal Medicine

## 2013-05-19 DIAGNOSIS — K5903 Drug induced constipation: Secondary | ICD-10-CM | POA: Diagnosis present

## 2013-05-19 DIAGNOSIS — R51 Headache: Secondary | ICD-10-CM | POA: Diagnosis not present

## 2013-05-19 DIAGNOSIS — K219 Gastro-esophageal reflux disease without esophagitis: Secondary | ICD-10-CM

## 2013-05-19 DIAGNOSIS — Z90411 Acquired partial absence of pancreas: Secondary | ICD-10-CM | POA: Diagnosis not present

## 2013-05-19 DIAGNOSIS — E119 Type 2 diabetes mellitus without complications: Secondary | ICD-10-CM

## 2013-05-19 DIAGNOSIS — R109 Unspecified abdominal pain: Secondary | ICD-10-CM | POA: Diagnosis present

## 2013-05-19 DIAGNOSIS — Z9119 Patient's noncompliance with other medical treatment and regimen: Secondary | ICD-10-CM

## 2013-05-19 DIAGNOSIS — IMO0001 Reserved for inherently not codable concepts without codable children: Secondary | ICD-10-CM | POA: Diagnosis present

## 2013-05-19 DIAGNOSIS — I609 Nontraumatic subarachnoid hemorrhage, unspecified: Secondary | ICD-10-CM

## 2013-05-19 DIAGNOSIS — R7402 Elevation of levels of lactic acid dehydrogenase (LDH): Secondary | ICD-10-CM | POA: Diagnosis present

## 2013-05-19 DIAGNOSIS — E871 Hypo-osmolality and hyponatremia: Secondary | ICD-10-CM | POA: Diagnosis present

## 2013-05-19 DIAGNOSIS — I959 Hypotension, unspecified: Secondary | ICD-10-CM

## 2013-05-19 DIAGNOSIS — F411 Generalized anxiety disorder: Secondary | ICD-10-CM | POA: Diagnosis present

## 2013-05-19 DIAGNOSIS — R112 Nausea with vomiting, unspecified: Secondary | ICD-10-CM | POA: Diagnosis not present

## 2013-05-19 DIAGNOSIS — K567 Ileus, unspecified: Secondary | ICD-10-CM

## 2013-05-19 DIAGNOSIS — K5900 Constipation, unspecified: Secondary | ICD-10-CM | POA: Diagnosis not present

## 2013-05-19 DIAGNOSIS — Z87442 Personal history of urinary calculi: Secondary | ICD-10-CM

## 2013-05-19 DIAGNOSIS — E1069 Type 1 diabetes mellitus with other specified complication: Secondary | ICD-10-CM | POA: Diagnosis present

## 2013-05-19 DIAGNOSIS — T40605A Adverse effect of unspecified narcotics, initial encounter: Secondary | ICD-10-CM | POA: Diagnosis present

## 2013-05-19 DIAGNOSIS — F319 Bipolar disorder, unspecified: Secondary | ICD-10-CM

## 2013-05-19 DIAGNOSIS — K7689 Other specified diseases of liver: Secondary | ICD-10-CM | POA: Diagnosis not present

## 2013-05-19 DIAGNOSIS — R7401 Elevation of levels of liver transaminase levels: Secondary | ICD-10-CM | POA: Diagnosis present

## 2013-05-19 DIAGNOSIS — Z8719 Personal history of other diseases of the digestive system: Secondary | ICD-10-CM

## 2013-05-19 DIAGNOSIS — R1012 Left upper quadrant pain: Secondary | ICD-10-CM | POA: Diagnosis not present

## 2013-05-19 DIAGNOSIS — R52 Pain, unspecified: Secondary | ICD-10-CM

## 2013-05-19 DIAGNOSIS — I81 Portal vein thrombosis: Secondary | ICD-10-CM

## 2013-05-19 DIAGNOSIS — K5909 Other constipation: Secondary | ICD-10-CM | POA: Diagnosis present

## 2013-05-19 DIAGNOSIS — T148XXA Other injury of unspecified body region, initial encounter: Secondary | ICD-10-CM

## 2013-05-19 DIAGNOSIS — R739 Hyperglycemia, unspecified: Secondary | ICD-10-CM

## 2013-05-19 DIAGNOSIS — G43909 Migraine, unspecified, not intractable, without status migrainosus: Secondary | ICD-10-CM

## 2013-05-19 DIAGNOSIS — K560 Paralytic ileus: Secondary | ICD-10-CM | POA: Diagnosis present

## 2013-05-19 DIAGNOSIS — Z794 Long term (current) use of insulin: Secondary | ICD-10-CM | POA: Diagnosis not present

## 2013-05-19 DIAGNOSIS — G8929 Other chronic pain: Secondary | ICD-10-CM | POA: Diagnosis present

## 2013-05-19 DIAGNOSIS — K769 Liver disease, unspecified: Secondary | ICD-10-CM

## 2013-05-19 DIAGNOSIS — S93409A Sprain of unspecified ligament of unspecified ankle, initial encounter: Secondary | ICD-10-CM

## 2013-05-19 DIAGNOSIS — Z91199 Patient's noncompliance with other medical treatment and regimen due to unspecified reason: Secondary | ICD-10-CM

## 2013-05-19 LAB — COMPREHENSIVE METABOLIC PANEL
AST: 50 U/L — ABNORMAL HIGH (ref 0–37)
Albumin: 3.4 g/dL — ABNORMAL LOW (ref 3.5–5.2)
Alkaline Phosphatase: 149 U/L — ABNORMAL HIGH (ref 39–117)
BUN: 12 mg/dL (ref 6–23)
Chloride: 96 mEq/L (ref 96–112)
Creatinine, Ser: 0.28 mg/dL — ABNORMAL LOW (ref 0.50–1.10)
Potassium: 3.7 mEq/L (ref 3.5–5.1)
Total Bilirubin: 0.5 mg/dL (ref 0.3–1.2)
Total Protein: 7.2 g/dL (ref 6.0–8.3)

## 2013-05-19 LAB — URINALYSIS, ROUTINE W REFLEX MICROSCOPIC
Bilirubin Urine: NEGATIVE
Glucose, UA: >1000 mg/dL — AB
Ketones, ur: NEGATIVE mg/dL
Leukocytes, UA: NEGATIVE
Nitrite: NEGATIVE
Specific Gravity, Urine: 1.01 (ref 1.005–1.030)
pH: 6 (ref 5.0–8.0)

## 2013-05-19 LAB — GLUCOSE, CAPILLARY
Glucose-Capillary: 306 mg/dL — ABNORMAL HIGH (ref 70–99)
Glucose-Capillary: 103 mg/dL — ABNORMAL HIGH (ref 70–99)

## 2013-05-19 LAB — LIPASE, BLOOD: Lipase: 20 U/L (ref 11–59)

## 2013-05-19 LAB — CBC WITH DIFFERENTIAL/PLATELET
Basophils Absolute: 0 10*3/uL (ref 0.0–0.1)
Basophils Relative: 0 % (ref 0–1)
Eosinophils Absolute: 0 10*3/uL (ref 0.0–0.7)
MCH: 32.1 pg (ref 26.0–34.0)
MCHC: 34.9 g/dL (ref 30.0–36.0)
Monocytes Relative: 4 % (ref 3–12)
Neutro Abs: 3.9 10*3/uL (ref 1.7–7.7)
Neutrophils Relative %: 62 % (ref 43–77)
RDW: 13.3 % (ref 11.5–15.5)

## 2013-05-19 LAB — URINE MICROSCOPIC-ADD ON

## 2013-05-19 MED ORDER — TRAZODONE HCL 50 MG PO TABS
100.0000 mg | ORAL_TABLET | Freq: Every day | ORAL | Status: DC
  Administered 2013-05-19 – 2013-05-20 (×2): 100 mg via ORAL
  Filled 2013-05-19 (×2): qty 2

## 2013-05-19 MED ORDER — ONDANSETRON HCL 4 MG/2ML IJ SOLN
4.0000 mg | Freq: Four times a day (QID) | INTRAMUSCULAR | Status: DC | PRN
  Administered 2013-05-19 – 2013-05-20 (×4): 4 mg via INTRAVENOUS
  Filled 2013-05-19 (×4): qty 2

## 2013-05-19 MED ORDER — INSULIN GLARGINE 100 UNIT/ML ~~LOC~~ SOLN
5.0000 [IU] | Freq: Every day | SUBCUTANEOUS | Status: DC
  Administered 2013-05-19: 5 [IU] via SUBCUTANEOUS
  Filled 2013-05-19: qty 0.05

## 2013-05-19 MED ORDER — HYDROMORPHONE HCL PF 1 MG/ML IJ SOLN: 0.5000 mg | INTRAMUSCULAR | Status: DC | PRN

## 2013-05-19 MED ORDER — GABAPENTIN 400 MG PO CAPS
800.0000 mg | ORAL_CAPSULE | Freq: Three times a day (TID) | ORAL | Status: DC
  Administered 2013-05-19 – 2013-05-21 (×6): 800 mg via ORAL
  Filled 2013-05-19 (×6): qty 2

## 2013-05-19 MED ORDER — HYDROMORPHONE HCL PF 1 MG/ML IJ SOLN
0.5000 mg | INTRAMUSCULAR | Status: DC | PRN
  Administered 2013-05-19: 0.5 mg via INTRAVENOUS
  Filled 2013-05-19: qty 1

## 2013-05-19 MED ORDER — ONDANSETRON HCL 4 MG PO TABS
4.0000 mg | ORAL_TABLET | Freq: Four times a day (QID) | ORAL | Status: DC | PRN
  Administered 2013-05-21: 4 mg via ORAL
  Filled 2013-05-19: qty 1

## 2013-05-19 MED ORDER — PROMETHAZINE HCL 25 MG/ML IJ SOLN
25.0000 mg | Freq: Once | INTRAMUSCULAR | Status: AC
  Administered 2013-05-19: 25 mg via INTRAVENOUS
  Filled 2013-05-19: qty 1

## 2013-05-19 MED ORDER — HYDROMORPHONE HCL PF 1 MG/ML IJ SOLN
0.5000 mg | INTRAMUSCULAR | Status: DC | PRN
  Administered 2013-05-19 – 2013-05-20 (×2): 0.5 mg via INTRAVENOUS
  Filled 2013-05-19 (×2): qty 1

## 2013-05-19 MED ORDER — HYDROMORPHONE HCL PF 1 MG/ML IJ SOLN: 1.0000 mg | INTRAMUSCULAR | Status: DC | PRN

## 2013-05-19 MED ORDER — ENOXAPARIN SODIUM 40 MG/0.4ML ~~LOC~~ SOLN
40.0000 mg | SUBCUTANEOUS | Status: DC
  Administered 2013-05-19 – 2013-05-20 (×2): 40 mg via SUBCUTANEOUS
  Filled 2013-05-19 (×2): qty 0.4

## 2013-05-19 MED ORDER — IOHEXOL 300 MG/ML  SOLN
100.0000 mL | Freq: Once | INTRAMUSCULAR | Status: AC | PRN
  Administered 2013-05-19: 100 mL via INTRAVENOUS

## 2013-05-19 MED ORDER — OXYCODONE-ACETAMINOPHEN 5-325 MG PO TABS: 1.0000 | ORAL_TABLET | Freq: Once | ORAL | Status: DC

## 2013-05-19 MED ORDER — HYDROMORPHONE HCL PF 1 MG/ML IJ SOLN
1.0000 mg | Freq: Once | INTRAMUSCULAR | Status: AC
  Administered 2013-05-19: 1 mg via INTRAVENOUS
  Filled 2013-05-19: qty 1

## 2013-05-19 MED ORDER — FLUTICASONE PROPIONATE 50 MCG/ACT NA SUSP
2.0000 | Freq: Every day | NASAL | Status: DC
  Administered 2013-05-19 – 2013-05-21 (×3): 2 via NASAL
  Filled 2013-05-19: qty 16

## 2013-05-19 MED ORDER — SODIUM CHLORIDE 0.9 % IV BOLUS (SEPSIS)
500.0000 mL | Freq: Once | INTRAVENOUS | Status: AC
  Administered 2013-05-19: 500 mL via INTRAVENOUS

## 2013-05-19 MED ORDER — INSULIN ASPART 100 UNIT/ML ~~LOC~~ SOLN
0.0000 [IU] | Freq: Three times a day (TID) | SUBCUTANEOUS | Status: DC
  Administered 2013-05-19: 7 [IU] via SUBCUTANEOUS

## 2013-05-19 MED ORDER — DULOXETINE HCL 60 MG PO CPEP
60.0000 mg | ORAL_CAPSULE | Freq: Every day | ORAL | Status: DC
  Administered 2013-05-19 – 2013-05-20 (×2): 60 mg via ORAL
  Filled 2013-05-19 (×2): qty 1

## 2013-05-19 MED ORDER — PANTOPRAZOLE SODIUM 40 MG IV SOLR
40.0000 mg | Freq: Two times a day (BID) | INTRAVENOUS | Status: DC
  Administered 2013-05-19 – 2013-05-20 (×2): 40 mg via INTRAVENOUS
  Filled 2013-05-19 (×2): qty 40

## 2013-05-19 MED ORDER — SODIUM CHLORIDE 0.9 % IV SOLN
INTRAVENOUS | Status: DC
  Administered 2013-05-19 – 2013-05-20 (×2): via INTRAVENOUS

## 2013-05-19 MED ORDER — TOPIRAMATE 25 MG PO TABS
ORAL_TABLET | ORAL | Status: AC
  Filled 2013-05-19: qty 1

## 2013-05-19 MED ORDER — TOPIRAMATE 25 MG PO TABS
25.0000 mg | ORAL_TABLET | Freq: Every day | ORAL | Status: DC
  Administered 2013-05-19 – 2013-05-20 (×2): 25 mg via ORAL
  Filled 2013-05-19 (×3): qty 1

## 2013-05-19 MED ORDER — IOHEXOL 300 MG/ML  SOLN
50.0000 mL | Freq: Once | INTRAMUSCULAR | Status: AC | PRN
  Administered 2013-05-19: 50 mL via ORAL

## 2013-05-19 MED ORDER — INSULIN ASPART 100 UNIT/ML ~~LOC~~ SOLN: 7.0000 [IU] | Freq: Once | SUBCUTANEOUS | Status: DC

## 2013-05-19 MED ORDER — ACETAMINOPHEN 650 MG RE SUPP: 650.0000 mg | Freq: Four times a day (QID) | RECTAL | Status: DC | PRN

## 2013-05-19 MED ORDER — ACETAMINOPHEN 325 MG PO TABS: 650.0000 mg | ORAL_TABLET | Freq: Four times a day (QID) | ORAL | Status: DC | PRN

## 2013-05-19 NOTE — ED Notes (Signed)
Pt c/o upper abd pain with N/V and migraine HA, states that she took PO Dilaudid and Phenergan at 0400 this morning without relief

## 2013-05-19 NOTE — ED Provider Notes (Signed)
CSN: [id number]     Arrival date & time 05/19/13  1030 History  This chart was scribed for Donnetta Hutching, MD, by Yevette Edwards, ED Scribe. This patient was seen in room APA12/APA12 and the patient's care was started at 11:00 AM.   First MD Initiated Contact with Patient 05/19/13 1046     Chief Complaint  Patient presents with  . Abdominal Pain   (Consider location/radiation/quality/duration/timing/severity/associated sxs/prior Treatment) The history is provided by the patient. No language interpreter was used.   HPI Comments: [name] [name] is a 42 y.o. female, with DM and chronic pancreatitis,  who presents to the Emergency Department complaining of a migraine headache. She reports that she has a h/o migraines. The pt states that she is also experiencing epigastric abdominal pain which she rates as a 7/10. She reports that she has experienced a decreased appetite, nausea, five episodes of emesis, and loose bowels. The pt denies a h/o bowel obstruction. She has a h/o abdominal surgery including a Whipple procedure in 2011 at [facility] for chronic pancreatitis and six subsequent surgeries. The pt is not a smoker, and she denies alcohol use.  The pt's surgeon is Dr. Cliffton Asters at [facility].  Dr. Lilyan Punt is the pt's PCP Past Medical History  Diagnosis Date  . Pancreatitis   . Diabetes mellitus   . Esophageal ulcer 1997  . Depression   . Anxiety   . Migraine headache   . Bipolar disorder   . Glucose intolerance (impaired glucose tolerance)   . Chronic pancreatitis   . Kidney stones    Past Surgical History  Procedure Laterality Date  . Whipple procedure    . Intestinal blockage    . Abdominal surgery    . Abdominal hysterectomy    . Cholecystectomy    . Cesarean section    . Colonoscopy     No family history on file. History  Substance Use Topics  . Smoking status: Never Smoker   . Smokeless tobacco: Not on file  . Alcohol Use: No   No OB history provided.  Review of  Systems  Constitutional: Negative for appetite change.  Gastrointestinal: Positive for nausea, vomiting, abdominal pain and abdominal distention.  Neurological: Positive for headaches.  All other systems reviewed and are negative.    Allergies  Buspar and Metoclopramide hcl  Home Medications   Current Outpatient Rx  Name  Route  Sig  Dispense  Refill  . ciprofloxacin (CIPRO) 500 MG tablet   Oral   Take 1 tablet (500 mg total) by mouth 2 (two) times daily.   14 tablet   0   . dronabinol (MARINOL) 5 MG capsule   Oral   Take 5 mg by mouth 4 (four) times daily as needed (for appetite).          . DULoxetine (CYMBALTA) 60 MG capsule   Oral   Take 60 mg by mouth at bedtime.         . fluticasone (FLONASE) 50 MCG/ACT nasal spray   Nasal   Place 2 sprays into the nose daily.   16 g   5   . gabapentin (NEURONTIN) 800 MG tablet   Oral   Take 800 mg by mouth every 8 (eight) hours.         Marland Kitchen HYDROcodone-acetaminophen (NORCO/VICODIN) 5-325 MG per tablet   Oral   Take 1-2 tablets by mouth every 6 (six) hours as needed for pain.   14 tablet   0   .  HYDROmorphone (DILAUDID) 2 MG tablet   Oral   Take 2 mg by mouth 2 (two) times daily as needed for pain.         Marland Kitchen ibuprofen (ADVIL,MOTRIN) 800 MG tablet   Oral   Take 800 mg by mouth every 6 (six) hours as needed for pain.         Marland Kitchen insulin glargine (LANTUS) 100 UNIT/ML injection   Subcutaneous   Inject 10 Units into the skin at bedtime.         . insulin lispro (HUMALOG) 100 UNIT/ML injection   Subcutaneous   Inject 0-8 Units into the skin 3 (three) times daily before meals. Patient states she checks her blood sugar in the mornings         . loperamide (IMODIUM A-D) 2 MG tablet   Oral   Take 1 tablet (2 mg total) by mouth 4 (four) times daily as needed for diarrhea or loose stools.   30 tablet   0   . omeprazole (PRILOSEC) 20 MG capsule   Oral   Take 20 mg by mouth 2 (two) times daily.         .  ondansetron (ZOFRAN) 4 MG tablet   Oral   Take 4 mg by mouth every 8 (eight) hours as needed for nausea.          . promethazine (PHENERGAN) 25 MG tablet      TAKE ONE TABLET BY MOUTH EVERY 8 HOURS AS NEEDED FOR NAUSEA.   30 tablet   3   . zolmitriptan (ZOMIG) 5 MG tablet   Oral   Take 1 tablet (5 mg total) by mouth as needed for migraine.   10 tablet   0   . zolpidem (AMBIEN) 10 MG tablet   Oral   Take 10 mg by mouth at bedtime.          Triage Vitals: BP 110/78  Pulse 104  Temp(Src) 98.6 F (37 C) (Oral)  Resp 20  Ht 5\' 3"  (1.6 m)  Wt 115 lb (52.164 kg)  BMI 20.38 kg/m2  SpO2 100%  Physical Exam  Nursing note and vitals reviewed. Constitutional: She is oriented to person, place, and time. She appears well-developed and well-nourished. No distress.  HENT:  Head: Normocephalic and atraumatic.  Eyes: EOM are normal.  Neck: Neck supple. No tracheal deviation present.  Cardiovascular: Normal rate.   Pulmonary/Chest: Effort normal. No respiratory distress.  Abdominal:  Vertical, long midline scar. Tender to entire left side of abdomen.   Musculoskeletal: Normal range of motion.  Neurological: She is alert and oriented to person, place, and time.  Skin: Skin is warm and dry.  Psychiatric: She has a normal mood and affect. Her behavior is normal.    ED Course   DIAGNOSTIC STUDIES: Oxygen Saturation is 100% on room air, normal by my interpretation.    COORDINATION OF CARE:  11:03 AM- Discussed treatment plan with patient which includes imaging and  pain medication, and the patient agreed to the plan.   Procedures (including critical care time)  Labs Reviewed  COMPREHENSIVE METABOLIC PANEL - Abnormal; Notable for the following:    Sodium 131 (*)    Glucose, Bld 426 (*)    Creatinine, Ser 0.28 (*)    Albumin 3.4 (*)    AST 50 (*)    ALT 85 (*)    Alkaline Phosphatase 149 (*)    All other components within normal limits  URINALYSIS, ROUTINE W REFLEX  MICROSCOPIC - Abnormal; Notable for the following:    Glucose, UA >1000 (*)    Hgb urine dipstick TRACE (*)    All other components within normal limits  CBC WITH DIFFERENTIAL  LIPASE, BLOOD  URINE MICROSCOPIC-ADD ON     Ct Abdomen Pelvis W Contrast  05/19/2013   *RADIOLOGY REPORT*  Clinical Data: Left sided abdominal pain.  Status post Whipple procedure.  CT ABDOMEN AND PELVIS WITH CONTRAST  Technique:  Multidetector CT imaging of the abdomen and pelvis was performed following the standard protocol during bolus administration of intravenous contrast.  Contrast: 50mL OMNIPAQUE IOHEXOL 300 MG/ML  SOLN, OMNIPAQUE IOHEXOL 300 MG/ML  SOLN  Comparison: 04/25/2013 and 12/25/2012  Findings: Stable postoperative changes from a Whipple procedure. There is associated pneumobilia.  Diffuse hepatic steatosis.  Gallbladder and spleen are unremarkable.  Pancreatic atrophy with dilatation of the pancreatic duct is stable.  Kidneys are within normal limits.  Previously noted left renal calculus is no longer evident.  Stranding and adenopathy within the small bowel mesentery is not significantly changed.  Distended small bowel loops in the anterior abdomen have developed without clear transition point.  Focal ileus or partial small bowel obstruction pattern is not excluded.  Prominent stool burden throughout the length of the colon.  Bladder is within normal limits.  Uterus is absent.  Tiny amount of posterior right pleural fluid with scarring verse dependent atelectasis.  No destructive bone lesion.  No vertebral compression fracture.  Stable vasculature.  IMPRESSION: Mildly distended anterior abdominal small bowel loops suggesting focal ileus or partial small bowel obstruction pattern.  Otherwise stable exam including chronic mesenteric adenopathy and inflammatory changes.   Original Report Authenticated By: Jolaine Click, M.D.   No results found. No diagnosis found.  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   Admit for IV hydration and pain management.  No obvious acute abdomen. CT scan reveals a partial small bowel obstruction    I personally performed the services described in this documentation, which was scribed in my presence. The recorded information has been reviewed and is accurate.     Donnetta Hutching, MD 05/19/13 1525

## 2013-05-19 NOTE — ED Notes (Signed)
Pt c/o migraine headache that started Friday, upper abd pain that started two days ago with n/v,

## 2013-05-19 NOTE — H&P (Signed)
[facility] History and Physical  [name] [name] UJW:[id number] DOB: 1971/02/10 DOA: 05/19/2013  Referring physician:  PCP: [name], MD  Specialists:   Chief Complaint: migraine and persistent abdominal pain with vomiting and diarrhea  HPI: [name] [name] is a 42 y.o. female with past medical history that includes idiopathic pancreatitis status post Whipple procedure in 2011, diabetes, esophageal ulcer, depression, bipolar, migraines, chronic pain presents to the emergency department with the chief complaint of persistent abdominal pain accompanied by persistent nausea vomiting and diarrhea. Information is obtained from the patient. States that 2 days ago she developed a migraine for which she took her usual medications without relief. Yesterday around lunchtime she developed sudden sharp left upper quadrant pain. She states the pain was nonradiating constant she rated it a 9/10 . Associated symptoms include frequent loose stools up to 5 times per day for the last 2 days. She denies melena or bright red blood per rectum. In addition she has had nausea with vomiting at least 5 times a day for the last 2 days. She describes her emesis as clear "gastric juices" she denies any coffee ground emesis. The case for the last 2 days she has not been eating. Symptoms came on suddenly have persisted characterized as moderate. Lab work in the emergency room is significant for a sodium of 131, dose 426, AST 50 ALT 85 alkaline phosphatase 149, CT of the abdomen yields Mildly distended anterior abdominal small bowel loops suggesting focal ileus or partial small bowel obstruction pattern. Symptoms came on suddenly have persisted and are characterized as moderate. We are asked to admit   Review of Systems: 14 point review of systems complete all systems negative except as indicated in history of present illness.    Past Medical History  Diagnosis Date  . Pancreatitis   . Diabetes mellitus   . Esophageal  ulcer 1997  . Depression   . Anxiety   . Migraine headache   . Bipolar disorder   . Glucose intolerance (impaired glucose tolerance)   . Chronic pancreatitis   . Kidney stones    Past Surgical History  Procedure Laterality Date  . Whipple procedure    . Intestinal blockage    . Abdominal surgery    . Abdominal hysterectomy    . Cholecystectomy    . Cesarean section    . Colonoscopy     Social History:  reports that she has never smoked. She does not have any smokeless tobacco history on file. She reports that she does not drink alcohol or use illicit drugs. Patient is on disability she lives with her daughter. She is independent with ADL Allergies  Allergen Reactions  . Buspar (Buspirone) Other (See Comments)    Dizziness  . Metoclopramide Hcl     REGLAN REACTION: "lock-jaw"    No family history on file. mother and father both alive and are 56 years of age. Her father past medical history includes diabetes and hypertension. Mother's past medical history includes fibromyalgia.  Prior to Admission medications   Medication Sig Start Date End Date Taking? Authorizing Provider  dronabinol (MARINOL) 5 MG capsule Take 5 mg by mouth 4 (four) times daily as needed (for appetite).    Yes Historical Provider, MD  DULoxetine (CYMBALTA) 60 MG capsule Take 60 mg by mouth at bedtime.   Yes Historical Provider, MD  fluticasone (FLONASE) 50 MCG/ACT nasal spray Place 2 sprays into the nose daily. 03/31/13  Yes Babs Sciara, MD  gabapentin (NEURONTIN) 800  MG tablet Take 800 mg by mouth 3 (three) times daily.    Yes Historical Provider, MD  HYDROmorphone (DILAUDID) 2 MG tablet Take 2 mg by mouth 2 (two) times daily as needed for pain.   Yes Historical Provider, MD  insulin glargine (LANTUS) 100 UNIT/ML injection Inject 10 Units into the skin at bedtime. 12/25/12 12/25/13 Yes Campbell Riches, NP  insulin lispro (HUMALOG) 100 UNIT/ML injection Inject 0-8 Units into the skin 3 (three) times daily  before meals. Patient states she checks her blood sugar in the mornings   Yes Historical Provider, MD  omeprazole (PRILOSEC) 20 MG capsule Take 20 mg by mouth 2 (two) times daily.   Yes Historical Provider, MD  ondansetron (ZOFRAN) 4 MG tablet Take 4 mg by mouth every 8 (eight) hours as needed for nausea.  02/02/13  Yes Historical Provider, MD  promethazine (PHENERGAN) 25 MG tablet Take 25 mg by mouth every 6 (six) hours as needed for nausea.   Yes Historical Provider, MD  rizatriptan (MAXALT) 10 MG tablet Take 10 mg by mouth as needed for migraine. May repeat in 2 hours if needed   Yes Historical Provider, MD  topiramate (TOPAMAX) 25 MG tablet Take 25 mg by mouth at bedtime.   Yes Historical Provider, MD  traZODone (DESYREL) 100 MG tablet Take 100 mg by mouth at bedtime.   Yes Historical Provider, MD   Physical Exam: Filed Vitals:   05/19/13 1045  BP: 110/78  Pulse: 104  Temp: 98.6 F (37 C)  Resp: 20     General:  Lethargic NAD  Eyes: PERRL EOMI no scleral icterus  ENT: ears clear nose without drainage mucus membrane mouth pink slightly dry  Neck: supple full ROM   Cardiovascular: RRR No MGR No LE edema  Respiratory: normal effort BS clear bilaterally no wheeze  Abdomen: Slightly distended diffuse tenderness particularly in the left upper quadrant. Hyperactive bowel sounds. Several healed abdominal incisions  Skin: Warm and dry no rash no lesion  Musculoskeletal: No clubbing no cyanosis  Psychiatric: Calm cooperative  Neurologic: Speech clear but slightly slow and deliberate. facial symmetry. Able to follow commands albeit slowly  Labs on Admission:  Basic Metabolic Panel:  Recent Labs Lab 05/19/13 1249  NA 131*  K 3.7  CL 96  CO2 24  GLUCOSE 426*  BUN 12  CREATININE 0.28*  CALCIUM 9.3   Liver Function Tests:  Recent Labs Lab 05/19/13 1249  AST 50*  ALT 85*  ALKPHOS 149*  BILITOT 0.5  PROT 7.2  ALBUMIN 3.4*    Recent Labs Lab 05/19/13 1249   LIPASE 20   No results found for this basename: AMMONIA,  in the last 168 hours CBC:  Recent Labs Lab 05/19/13 1249  WBC 6.2  NEUTROABS 3.9  HGB 14.1  HCT 40.4  MCV 92.0  PLT 189   Cardiac Enzymes: No results found for this basename: CKTOTAL, CKMB, CKMBINDEX, TROPONINI,  in the last 168 hours  BNP (last 3 results) No results found for this basename: PROBNP,  in the last 8760 hours CBG: No results found for this basename: GLUCAP,  in the last 168 hours  Radiological Exams on Admission: Ct Abdomen Pelvis W Contrast  05/19/2013   *RADIOLOGY REPORT*  Clinical Data: Left sided abdominal pain.  Status post Whipple procedure.  CT ABDOMEN AND PELVIS WITH CONTRAST  Technique:  Multidetector CT imaging of the abdomen and pelvis was performed following the standard protocol during bolus administration of intravenous contrast.  Contrast: 50mL OMNIPAQUE IOHEXOL 300 MG/ML  SOLN, OMNIPAQUE IOHEXOL 300 MG/ML  SOLN  Comparison: 04/25/2013 and 12/25/2012  Findings: Stable postoperative changes from a Whipple procedure. There is associated pneumobilia.  Diffuse hepatic steatosis.  Gallbladder and spleen are unremarkable.  Pancreatic atrophy with dilatation of the pancreatic duct is stable.  Kidneys are within normal limits.  Previously noted left renal calculus is no longer evident.  Stranding and adenopathy within the small bowel mesentery is not significantly changed.  Distended small bowel loops in the anterior abdomen have developed without clear transition point.  Focal ileus or partial small bowel obstruction pattern is not excluded.  Prominent stool burden throughout the length of the colon.  Bladder is within normal limits.  Uterus is absent.  Tiny amount of posterior right pleural fluid with scarring verse dependent atelectasis.  No destructive bone lesion.  No vertebral compression fracture.  Stable vasculature.  IMPRESSION: Mildly distended anterior abdominal small bowel loops suggesting  focal ileus or partial small bowel obstruction pattern.  Otherwise stable exam including chronic mesenteric adenopathy and inflammatory changes.   Original Report Authenticated By: Jolaine Click, M.D.    EKG:   Assessment/Plan Principal Problem:   Abdominal pain: History of same. CT of the abdomen concerning for ileus and possible small bowel obstruction. Will admit to medical floor. Will provide clear liquid diet antimanic and pain medicine as needed. If no improvement may need to consider NG tube.   Ileus: CT also a concern for partial small bowel obstruction. Patient with frequent loose stools. Will check stool for GI pathogen. Will keep on clear liquids as tolerated.  Diarrhea: Etiology uncertain. See #1 and #2. Patient reports 5 stools a day for the last 3 days. Will keep on clear liquids. Send stool for C. difficile and GI pathogen. Will monitor electrolytes and address as indicated    Hyponatremia: Related to #3. Will provide gentle IV hydration and recheck in the morning.    Hyperglycemia: In the setting of type 2 diabetes. History of same. Non-DKA. Will check A1c. Will use sliding scale glycemic control. Her liquid diet for now     BIPOLAR DISORDER UNSPECIFIED: Appears stable at baseline    MIGRAINE HEADACHE: This at reports onset of migraine 2 days ago. Somewhat improved with Dilaudid.   GASTROESOPHAGEAL REFLUX DISEASE, HX OF: Continue PPI  Chronic pain: We'll continue patient's Neurontin. Will hold patient's Phenergan, Marinol. Of note patient takes by mouth.lauded at home.   Elevated transaminase. History of same in the setting of hepatic steatosis per CT. Chart review indicates current levels are close to baseline.      Code Status: Full Family Communication: None at bedside Disposition Plan: Home when ready  Time spent: 65 minutes  Gwenyth Bender [facility] Pager [phone]  If 7PM-7AM, please contact night-coverage [email or web address] Password [facility] 05/19/2013,  4:02 PM Attending: Patient seen and examined. Abdomen slightly distended, however not particularly tender upon distraction of the patient. Bowel sounds are heard and are hyperactive. The patient has opened her bowels per her own history. Follow clinically. KUB tomorrow morning.

## 2013-05-20 ENCOUNTER — Inpatient Hospital Stay (HOSPITAL_COMMUNITY): Payer: Medicare Other

## 2013-05-20 DIAGNOSIS — K5900 Constipation, unspecified: Secondary | ICD-10-CM

## 2013-05-20 DIAGNOSIS — I959 Hypotension, unspecified: Secondary | ICD-10-CM | POA: Diagnosis present

## 2013-05-20 DIAGNOSIS — R109 Unspecified abdominal pain: Secondary | ICD-10-CM | POA: Diagnosis not present

## 2013-05-20 DIAGNOSIS — R1012 Left upper quadrant pain: Secondary | ICD-10-CM | POA: Diagnosis not present

## 2013-05-20 DIAGNOSIS — E119 Type 2 diabetes mellitus without complications: Secondary | ICD-10-CM | POA: Diagnosis not present

## 2013-05-20 LAB — COMPREHENSIVE METABOLIC PANEL
Alkaline Phosphatase: 108 U/L (ref 39–117)
BUN: 13 mg/dL (ref 6–23)
Chloride: 103 mEq/L (ref 96–112)
Creatinine, Ser: 0.38 mg/dL — ABNORMAL LOW (ref 0.50–1.10)
GFR calc Af Amer: >90 mL/min (ref >90)
Glucose, Bld: 110 mg/dL — ABNORMAL HIGH (ref 70–99)
Potassium: 3.7 mEq/L (ref 3.5–5.1)
Total Bilirubin: 0.4 mg/dL (ref 0.3–1.2)
Total Protein: 6.4 g/dL (ref 6.0–8.3)

## 2013-05-20 LAB — GLUCOSE, CAPILLARY
Glucose-Capillary: 388 mg/dL — ABNORMAL HIGH (ref 70–99)
Glucose-Capillary: 241 mg/dL — ABNORMAL HIGH (ref 70–99)

## 2013-05-20 LAB — CBC
Platelets: 183 10*3/uL (ref 150–400)
RDW: 13.7 % (ref 11.5–15.5)
WBC: 5.3 10*3/uL (ref 4.0–10.5)

## 2013-05-20 MED ORDER — INSULIN ASPART 100 UNIT/ML ~~LOC~~ SOLN
0.0000 [IU] | Freq: Every day | SUBCUTANEOUS | Status: DC
  Administered 2013-05-20: 2 [IU] via SUBCUTANEOUS

## 2013-05-20 MED ORDER — PANTOPRAZOLE SODIUM 40 MG PO TBEC
40.0000 mg | DELAYED_RELEASE_TABLET | Freq: Two times a day (BID) | ORAL | Status: DC
  Administered 2013-05-20 – 2013-05-21 (×2): 40 mg via ORAL
  Filled 2013-05-20 (×2): qty 1

## 2013-05-20 MED ORDER — BISACODYL 10 MG RE SUPP: 10.0000 mg | Freq: Every day | RECTAL | Status: DC | PRN

## 2013-05-20 MED ORDER — HYDROMORPHONE HCL PF 1 MG/ML IJ SOLN
0.5000 mg | INTRAMUSCULAR | Status: DC | PRN
  Administered 2013-05-20 (×3): 0.5 mg via INTRAVENOUS
  Filled 2013-05-20 (×3): qty 1

## 2013-05-20 MED ORDER — POLYETHYLENE GLYCOL 3350 17 G PO PACK
17.0000 g | PACK | Freq: Two times a day (BID) | ORAL | Status: DC
  Administered 2013-05-20 – 2013-05-21 (×3): 17 g via ORAL
  Filled 2013-05-20 (×3): qty 1

## 2013-05-20 MED ORDER — SENNA 8.6 MG PO TABS
2.0000 | ORAL_TABLET | Freq: Every day | ORAL | Status: DC
  Administered 2013-05-20 – 2013-05-21 (×2): 17.2 mg via ORAL
  Filled 2013-05-20 (×2): qty 2

## 2013-05-20 MED ORDER — INSULIN GLARGINE 100 UNIT/ML ~~LOC~~ SOLN
10.0000 [IU] | Freq: Every day | SUBCUTANEOUS | Status: DC
  Administered 2013-05-20: 10 [IU] via SUBCUTANEOUS
  Filled 2013-05-20: qty 0.1

## 2013-05-20 MED ORDER — INSULIN ASPART 100 UNIT/ML ~~LOC~~ SOLN
0.0000 [IU] | Freq: Three times a day (TID) | SUBCUTANEOUS | Status: DC
  Administered 2013-05-20 (×2): 9 [IU] via SUBCUTANEOUS
  Administered 2013-05-21: 7 [IU] via SUBCUTANEOUS
  Administered 2013-05-21: 9 [IU] via SUBCUTANEOUS

## 2013-05-20 MED ORDER — SUMATRIPTAN SUCCINATE 50 MG PO TABS
50.0000 mg | ORAL_TABLET | ORAL | Status: DC | PRN
  Administered 2013-05-21: 50 mg via ORAL
  Filled 2013-05-20: qty 1

## 2013-05-20 MED ORDER — HYDROMORPHONE HCL PF 1 MG/ML IJ SOLN: 0.5000 mg | INTRAMUSCULAR | Status: DC | PRN

## 2013-05-20 NOTE — Care Management Note (Signed)
    Page 1 of 1   05/21/2013     1:45:59 PM   CARE MANAGEMENT NOTE 05/21/2013  Patient:  [name] [name], [name] [name]   Account Number:  [id number]  Date Initiated:  05/20/2013  Documentation initiated by:  Rosemary Holms  Subjective/Objective Assessment:   pt admitted from home where she lives with her daughters (20 and 32). In the past has had [facility]. No HH needs identified at this time.     Action/Plan:   Anticipated DC Date:  05/22/2013   Anticipated DC Plan:  HOME/SELF CARE      DC Planning Services  CM consult      Choice offered to / List presented to:             Status of service:  Completed, signed off Medicare Important Message given?   (If response is "NO", the following Medicare IM given date fields will be blank) Date Medicare IM given:   Date Additional Medicare IM given:    Discharge Disposition:  HOME/SELF CARE  Per UR Regulation:    If discussed at Long Length of Stay Meetings, dates discussed:    Comments:  05/20/13 Rosemary Holms RN BNS CM

## 2013-05-20 NOTE — Progress Notes (Signed)
Pt received two soap sud enemas today. Both had positive results. Will continue to monitor.

## 2013-05-20 NOTE — Progress Notes (Signed)
Chart reviewed. Patient interviewed and examined. Discussed with Ms. [name] Smothers, NP. Reviewed progress note. Following addendum made.  Patient history of heliotropic pancreatitis, postprocedure 2011, type II DM/IDDM, esophageal ulcer, depression, bipolar disorder, migraine headaches, chronic pain, follows with pain M.D. in [place], [place] and surgeon at [facility]. She was admitted on 05/19/13 with complaints of abdominal pain, nausea, vomiting and diarrhea. CT abdomen on admission revealed findings suggestive of focal ileus or partial small bowel obstruction.  Today, patient states that her vomiting has resolved. She is mildly nauseous but wishes to advance diet as tolerated. Continues to complain of abdominal pain although she appears quite comfortable and smiling when she says that. No BM since admission.  Small built and nourished female patient, comfortably lying in bed in no obvious distress. Blood pressure: 90/60 mmHg. Other vital signs stable. Abdomen:? Minimal distention, soft and inconsistent tenderness. Normal bowel sounds heard.   Labs: CBG 434-previous ones 95 and 103. KUB shows large amount of feces throughout the colon without bowel obstruction.  Assessment and plan  Abdominal pain: History of chronic pain and sees pain M.D. as outpatient. Worsened pain this admission may be from constipation related to opioids. Minimize opioids. Advance diet as tolerated. Soapsuds enema x1 and start bowel regimen. Patient agrees to plan. No evidence of obstruction clinically or on KUB today.  Severe constipation: Diarrhea may have been due to overflow incontinence. Management as per problem #1.  Diabetes mellitus type 2: Fluctuating blood sugars. Current high CBG probably from consuming some sweet drinks. Increase Lantus to home dose of 10 units each bedtime and continue SSI and monitor.  Hypotension: Asymptomatic. Monitor. Probably run soft blood pressures  History of migraine  headaches: Imitrex when necessary.  Mild transaminitis: Patient has mild chronic elevation of her enzymes.  Disposition: If patient tolerates diet and stable, possible DC 8/14.  [facility] 05/20/13 12:43 PM

## 2013-05-20 NOTE — Progress Notes (Signed)
Spoke with Dr. Orvan Falconer about patient B/P of 87/51. Alert and oriented. Asymptomatic. Will continue to monitor.

## 2013-05-20 NOTE — Progress Notes (Signed)
[facility] PROGRESS NOTE  [name] [name] ZOX:[id number] DOB: 1971-04-04 DOA: 05/19/2013 PCP: [name], MD  Assessment/Plan: Abdominal pain: History of same. CT of the abdomen concerning for ileus and possible small bowel obstruction. Reports little improvement in pain but abdomen less distended on exam and less tender. Will advance diet to clear liquids. Will decrease IV pain medicine.    Ileus: CT also a concern for partial small bowel obstruction. Patient with reported frequent loose stools prior to admission. None since admission.  Will check stool for GI pathogen. Will keep on clear liquids and advance as tolerated.   Diarrhea: Etiology uncertain. See #1 and #2. Patient reports 5 stools a day for the previous 3 days. None since admission.  Send stool for C. difficile and GI pathogen.   Hypotension: asymptomatic. SBP range 88-112. Likely related to medications. Will decrease pain med. Monitor. No white count, afebrile.   Hyponatremia: Related to #3. Resolved with gentle IV hydration. Advance to clear liquids and decrease IV rate. Continue to monitor.    Hyperglycemia: In the setting of type 2 diabetes. History of same. Non-DKA. A1c 10.6 indicating very poor control. Will request Diabetes coordinator consult.  Will use sliding scale glycemic control.    BIPOLAR DISORDER UNSPECIFIED: Appears stable at baseline   MIGRAINE HEADACHE: This at reports onset of migraine 2 days ago. Somewhat improved with Dilaudid.   GASTROESOPHAGEAL REFLUX DISEASE, HX OF: Continue PPI  Code Status: full Family Communication: none available Disposition Plan: home when ready   Consultants:  none  Procedures:  none  Antibiotics:  none  HPI/Subjective: Lying in bed awake alert. Reports abdominal pain  Objective: Filed Vitals:   05/20/13 0744  BP: 85/49  Pulse:   Temp:   Resp:     Intake/Output Summary (Last 24 hours) at 05/20/13 0813 Last data filed at 05/20/13 0600  Gross per 24  hour  Intake   1740 ml  Output    500 ml  Net   1240 ml   Filed Weights   05/19/13 1045 05/19/13 1634  Weight: 115 lb (52.164 kg) 115 lb (52.164 kg)    Exam:   General:  Well nourished NAD  Cardiovascular: RRR No MGR No LE edema  Respiratory: normal effort BS clear bilaterally no wheeze  Abdomen: soft hyperactive BS non-tender when distracted. Moderate tenderness particularly LLQ to palpation  Musculoskeletal: no clubbing no cyanosis   Data Reviewed: Basic Metabolic Panel:  Recent Labs Lab 05/19/13 1249 05/20/13 0607  NA 131* 139  K 3.7 3.7  CL 96 103  CO2 24 28  GLUCOSE 426* 110*  BUN 12 13  CREATININE 0.28* 0.38*  CALCIUM 9.3 8.1*   Liver Function Tests:  Recent Labs Lab 05/19/13 1249 05/20/13 0607  AST 50* 67*  ALT 85* 75*  ALKPHOS 149* 108  BILITOT 0.5 0.4  PROT 7.2 6.4  ALBUMIN 3.4* 2.9*    Recent Labs Lab 05/19/13 1249  LIPASE 20   No results found for this basename: AMMONIA,  in the last 168 hours CBC:  Recent Labs Lab 05/19/13 1249 05/20/13 0607  WBC 6.2 5.3  NEUTROABS 3.9  --   HGB 14.1 13.0  HCT 40.4 38.4  MCV 92.0 94.3  PLT 189 183   Cardiac Enzymes: No results found for this basename: CKTOTAL, CKMB, CKMBINDEX, TROPONINI,  in the last 168 hours BNP (last 3 results) No results found for this basename: PROBNP,  in the last 8760 hours CBG:  Recent Labs Lab 05/19/13 1701  05/19/13 2200 05/20/13 0743  GLUCAP 306* 103* 95    No results found for this or any previous visit (from the past 240 hour(s)).   Studies: Ct Abdomen Pelvis W Contrast  05/19/2013   *RADIOLOGY REPORT*  Clinical Data: Left sided abdominal pain.  Status post Whipple procedure.  CT ABDOMEN AND PELVIS WITH CONTRAST  Technique:  Multidetector CT imaging of the abdomen and pelvis was performed following the standard protocol during bolus administration of intravenous contrast.  Contrast: 50mL OMNIPAQUE IOHEXOL 300 MG/ML  SOLN, OMNIPAQUE IOHEXOL 300  MG/ML  SOLN  Comparison: 04/25/2013 and 12/25/2012  Findings: Stable postoperative changes from a Whipple procedure. There is associated pneumobilia.  Diffuse hepatic steatosis.  Gallbladder and spleen are unremarkable.  Pancreatic atrophy with dilatation of the pancreatic duct is stable.  Kidneys are within normal limits.  Previously noted left renal calculus is no longer evident.  Stranding and adenopathy within the small bowel mesentery is not significantly changed.  Distended small bowel loops in the anterior abdomen have developed without clear transition point.  Focal ileus or partial small bowel obstruction pattern is not excluded.  Prominent stool burden throughout the length of the colon.  Bladder is within normal limits.  Uterus is absent.  Tiny amount of posterior right pleural fluid with scarring verse dependent atelectasis.  No destructive bone lesion.  No vertebral compression fracture.  Stable vasculature.  IMPRESSION: Mildly distended anterior abdominal small bowel loops suggesting focal ileus or partial small bowel obstruction pattern.  Otherwise stable exam including chronic mesenteric adenopathy and inflammatory changes.   Original Report Authenticated By: Jolaine Click, M.D.    Scheduled Meds: . DULoxetine  60 mg Oral QHS  . enoxaparin (LOVENOX) injection  40 mg Subcutaneous Q24H  . fluticasone  2 spray Each Nare Daily  . gabapentin  800 mg Oral TID  . insulin aspart  0-9 Units Subcutaneous TID WC  . insulin aspart  7 Units Subcutaneous Once  . insulin glargine  5 Units Subcutaneous QHS  . pantoprazole (PROTONIX) IV  40 mg Intravenous Q12H  . topiramate  25 mg Oral QHS  . traZODone  100 mg Oral QHS   Continuous Infusions: . sodium chloride 100 mL/hr at 05/20/13 0600    Principal Problem:   Abdominal pain Active Problems:   BIPOLAR DISORDER UNSPECIFIED   MIGRAINE HEADACHE   GASTROESOPHAGEAL REFLUX DISEASE, HX OF   Diabetes   Ileus   Hyponatremia   Hyperglycemia    Elevated transaminase level    Time spent: 30 minutes    [facility] M  [facility] Pager [phone]. If 7PM-7AM, please contact night-coverage at [email or web address], password [facility] 05/20/2013, 8:13 AM  LOS: 1 day

## 2013-05-20 NOTE — Progress Notes (Addendum)
Verbally reported CBG level of 434 to MD. Orders are to give the 9 units of novolog via sliding scale and to monitor as pt's CBGs are usual low-normal. Will continue to monitor.

## 2013-05-20 NOTE — Progress Notes (Signed)
Spoke with pt about diabetes and outpatient regimen for diabetes management.  Patient reports that she was diagnosed with type 2 diabetes in 2010 and initially started out taking oral agents for diabetes control.  However, after having the Whipple procedure she went on insulin and has remained on insulin since then.  Discussed A1C results (10.6%) with her and explained what an A1C is, basic pathophysiology of DM Type 2, basic home care, importance of checking CBGs and maintaining good CBG control to prevent long-term and short-term complications. Patient reports that she see Dr. Gerda Diss as her PCP and he helps her manage her diabetes.  She is prescribed Lantus 10 units at bedtime and Humalog 0-8 units TID with meals (based on sliding scale). However, patient admits that she takes her Lantus as prescribed but she does not routinely check her blood sugar and take the Humalog for correction.  She states that she averages checking and taking the Humalog about once a day.  Discussed at length the relationship between glucose control and development of complications related to diabetes.  Encouraged patient to take an active part in improving glycemic control and following advice of her PCP.    Reviewed with patient how diet, exercise, and stress impacts blood sugar.  Patient states that she drinks diet drinks and water but she does not really follow a diabetic diet.  Discussed importance of looking at food labels and monitoring carbohydrate intake.  Patient reports that she feels she would benefit from more education on a diabetic diet. Will consult dietitian and informed patient about free outpatient diabetes education class taught here at [facility] by Altamese Cabal, RD.  Patient verbalized understanding of information discussed and reports that she has no further questions at this time.  Will continue to follow while inpatient.  Thanks, Orlando Penner, RN, MSN, CCRN Diabetes Coordinator Inpatient Diabetes  Program [phone]

## 2013-05-21 DIAGNOSIS — E119 Type 2 diabetes mellitus without complications: Secondary | ICD-10-CM | POA: Diagnosis not present

## 2013-05-21 DIAGNOSIS — K5900 Constipation, unspecified: Secondary | ICD-10-CM | POA: Diagnosis not present

## 2013-05-21 DIAGNOSIS — R109 Unspecified abdominal pain: Secondary | ICD-10-CM | POA: Diagnosis not present

## 2013-05-21 DIAGNOSIS — I959 Hypotension, unspecified: Secondary | ICD-10-CM | POA: Diagnosis not present

## 2013-05-21 LAB — GLUCOSE, CAPILLARY

## 2013-05-21 LAB — GI PATHOGEN PANEL BY PCR, STOOL
C difficile toxin A/B: NEGATIVE
E coli (STEC): NEGATIVE
Rotavirus A by PCR: NEGATIVE
Salmonella by PCR: NEGATIVE
Shigella by PCR: NEGATIVE

## 2013-05-21 LAB — CLOSTRIDIUM DIFFICILE BY PCR: Toxigenic C. Difficile by PCR: NEGATIVE

## 2013-05-21 MED ORDER — INSULIN ASPART 100 UNIT/ML ~~LOC~~ SOLN
3.0000 [IU] | Freq: Three times a day (TID) | SUBCUTANEOUS | Status: DC
Start: 2013-05-21 — End: 2013-05-21
  Administered 2013-05-21: 3 [IU] via SUBCUTANEOUS

## 2013-05-21 MED ORDER — INSULIN LISPRO 100 UNIT/ML ~~LOC~~ SOLN: SUBCUTANEOUS | Status: DC

## 2013-05-21 MED ORDER — POLYETHYLENE GLYCOL 3350 17 G PO PACK: 17.0000 g | PACK | Freq: Two times a day (BID) | ORAL | Status: DC

## 2013-05-21 MED ORDER — SENNA 8.6 MG PO TABS: 2.0000 | ORAL_TABLET | Freq: Every day | ORAL | Status: DC

## 2013-05-21 MED ORDER — INSULIN GLARGINE 100 UNIT/ML ~~LOC~~ SOLN: 13.0000 [IU] | Freq: Every day | SUBCUTANEOUS | Status: DC

## 2013-05-21 MED ORDER — HYDROMORPHONE HCL 2 MG PO TABS
2.0000 mg | ORAL_TABLET | Freq: Two times a day (BID) | ORAL | Status: DC | PRN
  Administered 2013-05-21: 2 mg via ORAL
  Filled 2013-05-21 (×2): qty 1

## 2013-05-21 MED ORDER — INSULIN LISPRO 100 UNIT/ML ~~LOC~~ SOLN: 4.0000 [IU] | Freq: Three times a day (TID) | SUBCUTANEOUS | Status: DC

## 2013-05-21 MED ORDER — INSULIN GLARGINE 100 UNIT/ML ~~LOC~~ SOLN
15.0000 [IU] | Freq: Every day | SUBCUTANEOUS | Status: DC
  Filled 2013-05-21: qty 0.15

## 2013-05-21 NOTE — Progress Notes (Signed)
  RD consulted for nutrition education regarding diabetes.    Lab Results  Component Value Date   HGBA1C 10.6* 05/19/2013    RD provided "Carbohydrate Counting for People with Diabetes" handout from the Academy of Nutrition and Dietetics. Discussed different food groups and their effects on blood sugar, emphasizing carbohydrate-containing foods. Provided list of carbohydrates and recommended serving sizes of common foods.  Discussed importance of controlled and consistent carbohydrate intake throughout the day. Provided examples of ways to balance meals/snacks and encouraged intake of high-fiber, whole grain complex carbohydrates. Teach back method used.  Expect fair compliance.  Body mass index is 20.38 kg/(m^2). Pt meets criteria for normal based on current BMI.  Current diet order is Heart Healthy/CHO Modified, patient is consuming approximately 100% of meals at this time. Labs and medications reviewed. No further nutrition interventions warranted at this time. RD contact information provided. If additional nutrition issues arise, please re-consult RD.  Royann Shivers MS,RD,LDN,CSG Office: [phone] Pager: [phone]

## 2013-05-21 NOTE — Discharge Summary (Signed)
Physician Discharge Summary  [name] [name] [name] DOB: Feb 06, 1971 DOA: 05/19/2013  PCP: [name], MD  Admit date: 05/19/2013 Discharge date: 05/21/2013  Time spent: 40 minutes  Recommendations for Outpatient Follow-up:  1. Follow up with Dr. Koren Shiver in 1 week for evaluation of symptoms  Discharge Diagnoses:  Principal Problem:   Abdominal pain Active Problems:   BIPOLAR DISORDER UNSPECIFIED   MIGRAINE HEADACHE   GASTROESOPHAGEAL REFLUX DISEASE, HX OF   Constipation   Diabetes   Ileus   Hyponatremia   Hyperglycemia   Elevated transaminase level   Hypotension   Discharge Condition: stable  Diet recommendation: carb modified  Filed Weights   05/19/13 1045 05/19/13 1634  Weight: 115 lb (52.164 kg) 115 lb (52.164 kg)    History of present illness:  JOELEEN [name] is a 42 y.o. female with past medical history that includes idiopathic pancreatitis status post Whipple procedure in 2011, diabetes, esophageal ulcer, depression, bipolar, migraines, chronic pain presented to the emergency department on 05/19/13 with the chief complaint of persistent abdominal pain accompanied by persistent nausea vomiting and diarrhea. Stated that 2 days prior she developed a migraine for which she took her usual medications without relief. One day prior around lunchtime she developed sudden sharp left upper quadrant pain. She stated the pain was nonradiating constant she rated it a 9/10 . Associated symptoms included frequent loose stools up to 5 times per day for the previous 2 days. She denied melena or bright red blood per rectum. In addition she  had nausea with vomiting at least 5 times a day for the previous 2 days. She described her emesis as clear "gastric juices" she denied any coffee ground emesis.  She indicated for the previous 2 days she had not been eating. Lab work in the emergency room  significant for a sodium of 131, glucose 426, AST 50 ALT 85 alkaline phosphatase 149, CT of the  abdomen yielded Mildly distended anterior abdominal small bowel loops suggesting focal ileus or partial small bowel obstruction pattern.   Hospital Course:  Abdominal pain: History of chronic pain and sees pain M.D. as outpatient. Worsened pain this admission likely from constipation related to opioids. Minimized opioids. Soapsuds enema x2 with good results. Started bowel regimen. C diff.  At time of discharge pain much improved.  No evidence of obstruction clinically or on KUB today.  Severe constipation: Diarrhea presumed due to overflow incontinence. Management as per problem #1. Good results from enema. Bowel regimen started.   Diabetes mellitus type 2: Uncontrolled. HgA1c 10.6.  Fluctuating blood sugars during hospitalization. High CBG probably from non-compliance with diet. Increased Lantus to 15u and added meal coverage.Continue SSI and monitor.  Hypotension: Asymptomatic. Likely baseline. SBP range 88-106. Probably run soft blood pressures. Recommend trending with PCP  History of migraine headaches: Imitrex when necessary.  Mild transaminitis: Patient has mild chronic elevation of her enzymes.       Procedures: none Consultations:  none  Discharge Exam: Filed Vitals:   05/21/13 1149  BP: 98/58  Pulse:   Temp:   Resp:     General: well nourished. NAD Cardiovascular: RRR No MGR No LE edema Respiratory: normal effort BS clear bilaterally no wheeze no rhonchi Abdomen: soft +BS non-tender to palpation  Discharge Instructions      Discharge Orders   Future Orders Complete By Expires   Call MD for:  extreme fatigue  As directed    Call MD for:  persistant dizziness or light-headedness  As directed  Call MD for:  persistant nausea and vomiting  As directed    Call MD for:  severe uncontrolled pain  As directed    Call MD for:  temperature >100.4  As directed    Diet - low sodium heart healthy  As directed    Diet Carb Modified  As directed    Increase activity slowly   As directed        Medication List    STOP taking these medications       promethazine 25 MG tablet  Commonly known as:  PHENERGAN      TAKE these medications       dronabinol 5 MG capsule  Commonly known as:  MARINOL  Take 5 mg by mouth 4 (four) times daily as needed (for appetite).     DULoxetine 60 MG capsule  Commonly known as:  CYMBALTA  Take 60 mg by mouth at bedtime.     fluticasone 50 MCG/ACT nasal spray  Commonly known as:  FLONASE  Place 2 sprays into the nose daily.     gabapentin 800 MG tablet  Commonly known as:  NEURONTIN  Take 800 mg by mouth 3 (three) times daily.     HYDROmorphone 2 MG tablet  Commonly known as:  DILAUDID  Take 2 mg by mouth 2 (two) times daily as needed for pain.     insulin glargine 100 UNIT/ML injection  Commonly known as:  LANTUS  Inject 0.13 mL (13 Units total) into the skin at bedtime.     insulin lispro 100 UNIT/ML injection  Commonly known as:  HUMALOG  - 0-9 Units, Subcutaneous, 3 times daily with meals,  - CBG < 70: Drink or eat something sweet and recheck blood sugar.  - CBG 70 - 120: 0 units  - CBG 121 - 150: 1 unit  - CBG 151 - 200: 2 units  - CBG 201 - 250: 3 units  - CBG 251 - 300: 5 units  - CBG 301 - 350: 7 units  - CBG 351 - 400: 9 units  - CBG > 400: call MD     insulin lispro 100 UNIT/ML injection  Commonly known as:  HUMALOG  Inject 4 Units into the skin 3 (three) times daily with meals.     omeprazole 20 MG capsule  Commonly known as:  PRILOSEC  Take 20 mg by mouth 2 (two) times daily.     ondansetron 4 MG tablet  Commonly known as:  ZOFRAN  Take 4 mg by mouth every 8 (eight) hours as needed for nausea.     polyethylene glycol packet  Commonly known as:  MIRALAX / GLYCOLAX  Take 17 g by mouth 2 (two) times daily.     rizatriptan 10 MG tablet  Commonly known as:  MAXALT  Take 10 mg by mouth as needed for migraine. May repeat in 2 hours if needed     senna 8.6 MG Tabs tablet   Commonly known as:  SENOKOT  Take 2 tablets (17.2 mg total) by mouth daily.     topiramate 25 MG tablet  Commonly known as:  TOPAMAX  Take 25 mg by mouth at bedtime.     traZODone 100 MG tablet  Commonly known as:  DESYREL  Take 100 mg by mouth at bedtime.       Allergies  Allergen Reactions  . Buspar [Buspirone] Other (See Comments)    Dizziness  . Metoclopramide Hcl     REGLAN REACTION: "  lock-jaw"   Follow-up Information   Follow up with [name], MD. Schedule an appointment as soon as possible for a visit in 1 week. (Post Hospitalization follow up and Diabetes management.)    Specialty:  Family Medicine   Contact information:   [address] [place] [address] [phone]        The results of significant diagnostics from this hospitalization (including imaging, microbiology, ancillary and laboratory) are listed below for reference.    Significant Diagnostic Studies: Ct Abdomen Pelvis Wo Contrast  04/25/2013   *RADIOLOGY REPORT*  Clinical Data: abdominal pain and flank pain  CT ABDOMEN AND PELVIS WITHOUT CONTRAST  Technique:  Multidetector CT imaging of the abdomen and pelvis was performed following the standard protocol without intravenous contrast.  Comparison: 12/25/2012  Findings: The lung bases are clear.  No pleural effusion or pericardial effusion identified.   There is mild diffuse fatty infiltration of the liver parenchyma. Postoperative change compatible with Whipple procedure identified. Pneumobilia is identified compatible with biliary patency.  The body and tail of pancreas appear within normal limits.  Normal appearance of the spleen.  The adrenal glands are normal.  Normal appearance of the right kidney.  There is a 3 mm stone within the mid pole of left kidney, image 31/series 2.  There is no hydronephrosis or hydroureter identified.  No left-sided hydronephrosis or hydroureter.  The abdominal aorta has a normal caliber.  There is no upper  abdominal adenopathy.  No pelvic or inguinal adenopathy identified. Postoperative appearance of the stomach and proximal small bowel loops compatible with Whipple procedure.  The small bowel loops are prominent.  There is a moderate stool burden identified within the colon.  No evidence for bowel perforation.  No abscess formation.  Again noted is a large collateral vessel within the left abdomen which appears to originate from the splenic vein.  Review of the visualized osseous structures demonstrates no aggressive lytic or sclerotic bone lesions.  IMPRESSION: 1.  Nonobstructing left renal calculi. 2.  No evidence for hydronephrosis, hydroureter or ureterolithiasis. 3.  Postoperative change from Whipple procedure.  The small bowel loops appear prominent.  There is a moderate stool burden identified within the colon. 4.  Hepatic steatosis.   Original Report Authenticated By: Signa Kell, M.D.   Dg Abd 1 View  05/20/2013   *RADIOLOGY REPORT*  Clinical Data: Left upper quadrant pain, nausea, vomiting, diarrhea  ABDOMEN - 1 VIEW  Comparison: CT abdomen pelvis of 05/19/2013  Findings: There is a large amount of feces throughout the colon. No definite bowel obstruction is seen.  Only a small amount of small bowel gas is seen without significant distention.  Surgical clips are present in the upper abdomen and left abdomen.  Some contrast is noted within the urinary bladder.  IMPRESSION: Large amount of feces throughout the colon.  No definite bowel obstruction.   Original Report Authenticated By: Dwyane Dee, M.D.   Ct Abdomen Pelvis W Contrast  05/19/2013   *RADIOLOGY REPORT*  Clinical Data: Left sided abdominal pain.  Status post Whipple procedure.  CT ABDOMEN AND PELVIS WITH CONTRAST  Technique:  Multidetector CT imaging of the abdomen and pelvis was performed following the standard protocol during bolus administration of intravenous contrast.  Contrast: 50mL OMNIPAQUE IOHEXOL 300 MG/ML  SOLN, OMNIPAQUE  IOHEXOL 300 MG/ML  SOLN  Comparison: 04/25/2013 and 12/25/2012  Findings: Stable postoperative changes from a Whipple procedure. There is associated pneumobilia.  Diffuse hepatic steatosis.  Gallbladder and  spleen are unremarkable.  Pancreatic atrophy with dilatation of the pancreatic duct is stable.  Kidneys are within normal limits.  Previously noted left renal calculus is no longer evident.  Stranding and adenopathy within the small bowel mesentery is not significantly changed.  Distended small bowel loops in the anterior abdomen have developed without clear transition point.  Focal ileus or partial small bowel obstruction pattern is not excluded.  Prominent stool burden throughout the length of the colon.  Bladder is within normal limits.  Uterus is absent.  Tiny amount of posterior right pleural fluid with scarring verse dependent atelectasis.  No destructive bone lesion.  No vertebral compression fracture.  Stable vasculature.  IMPRESSION: Mildly distended anterior abdominal small bowel loops suggesting focal ileus or partial small bowel obstruction pattern.  Otherwise stable exam including chronic mesenteric adenopathy and inflammatory changes.   Original Report Authenticated By: Jolaine Click, M.D.    Microbiology: Recent Results (from the past 240 hour(s))  CLOSTRIDIUM DIFFICILE BY PCR     Status: None   Collection Time    05/21/13  9:00 AM      Result Value Range Status   C difficile by pcr NEGATIVE  NEGATIVE Final     Labs: Basic Metabolic Panel:  Recent Labs Lab 05/19/13 1249 05/20/13 0607  NA 131* 139  K 3.7 3.7  CL 96 103  CO2 24 28  GLUCOSE 426* 110*  BUN 12 13  CREATININE 0.28* 0.38*  CALCIUM 9.3 8.1*   Liver Function Tests:  Recent Labs Lab 05/19/13 1249 05/20/13 0607  AST 50* 67*  ALT 85* 75*  ALKPHOS 149* 108  BILITOT 0.5 0.4  PROT 7.2 6.4  ALBUMIN 3.4* 2.9*    Recent Labs Lab 05/19/13 1249  LIPASE 20   No results found for this basename: AMMONIA,  in  the last 168 hours CBC:  Recent Labs Lab 05/19/13 1249 05/20/13 0607  WBC 6.2 5.3  NEUTROABS 3.9  --   HGB 14.1 13.0  HCT 40.4 38.4  MCV 92.0 94.3  PLT 189 183   Cardiac Enzymes: No results found for this basename: CKTOTAL, CKMB, CKMBINDEX, TROPONINI,  in the last 168 hours BNP: BNP (last 3 results) No results found for this basename: PROBNP,  in the last 8760 hours CBG:  Recent Labs Lab 05/20/13 1110 05/20/13 1701 05/20/13 2026 05/21/13 0720 05/21/13 1123  GLUCAP 434* 388* 241* 318* 389*       Signed:  BLACK,KAREN M  [facility] 05/21/2013, 1:05 PM

## 2013-05-21 NOTE — Progress Notes (Signed)
Pt is to be discharged home today. Pt is in NAD, IV is out, all paperwork has been reviewed/discussed with patient, and there are no questions/concerns at this time. Assessment is unchanged from this morning. Pt is to be accompanied downstairs by staff and family via wheelchair.  

## 2013-05-21 NOTE — Progress Notes (Signed)
I.V. Infiltrated for second time tonight. Pt stated that she didn't want the i.v restarted because it took multiple attempts earlier tonight. Contacted hospitalist on call to make him aware and get iv meds changed to PO.

## 2013-05-27 NOTE — Discharge Summary (Signed)
Patient was interviewed and examined. Discussed with Ms. Toya Smothers, NP. Reviewed DC summary done by Ms. Black and following changes/addendum made.  The patient was advised to followup with PCP in 1 week from hospital discharge.  C. difficile PCR: Negative. Stool infectious panel: Negative  Patient was discharged on Lantus 13 units daily, NovoLog mealtime and SSI. She is advised to followup with her PCP in a week's time for further adjustments to these medications and she verbalized understanding. Hemoglobin A1c: 10.6 suggesting poor OP control.  HONGALGI,ANAND 05/27/2013 6:26 PM

## 2013-06-03 ENCOUNTER — Telehealth: Payer: Self-pay | Admitting: Family Medicine

## 2013-06-03 NOTE — Telephone Encounter (Signed)
Discussed rx sent to [facility].

## 2013-06-03 NOTE — Telephone Encounter (Signed)
Patient states she needs the needle tips for the Insulin pen.  [facility]    Please call Patient. Thanks

## 2013-06-03 NOTE — Telephone Encounter (Signed)
Patient has not been seen here for her diabetes. Was recently discharged from the hospital on 05/19/13 for poor diabetic control. Do we want to refill diabetic supplies? Patient also stated that she has no one managing her diabetes at this time.

## 2013-06-03 NOTE — Telephone Encounter (Signed)
Pt no showed at her last visit we scheduled earlier this year for diabetes management. Insulin times one ref and schedule a follow appt. Tell pt to keep it.

## 2013-06-04 DIAGNOSIS — G8929 Other chronic pain: Secondary | ICD-10-CM | POA: Diagnosis not present

## 2013-06-04 DIAGNOSIS — R109 Unspecified abdominal pain: Secondary | ICD-10-CM | POA: Diagnosis not present

## 2013-06-04 DIAGNOSIS — E119 Type 2 diabetes mellitus without complications: Secondary | ICD-10-CM | POA: Diagnosis not present

## 2013-06-04 DIAGNOSIS — G43909 Migraine, unspecified, not intractable, without status migrainosus: Secondary | ICD-10-CM | POA: Diagnosis not present

## 2013-06-04 DIAGNOSIS — G4700 Insomnia, unspecified: Secondary | ICD-10-CM | POA: Diagnosis not present

## 2013-06-04 DIAGNOSIS — K861 Other chronic pancreatitis: Secondary | ICD-10-CM | POA: Diagnosis not present

## 2013-06-04 DIAGNOSIS — Z888 Allergy status to other drugs, medicaments and biological substances status: Secondary | ICD-10-CM | POA: Diagnosis not present

## 2013-06-04 DIAGNOSIS — Z79899 Other long term (current) drug therapy: Secondary | ICD-10-CM | POA: Diagnosis not present

## 2013-06-05 ENCOUNTER — Other Ambulatory Visit: Payer: Self-pay

## 2013-06-05 MED ORDER — INSULIN LISPRO 100 UNIT/ML ~~LOC~~ SOLN: SUBCUTANEOUS | Status: DC

## 2013-06-09 ENCOUNTER — Ambulatory Visit (INDEPENDENT_AMBULATORY_CARE_PROVIDER_SITE_OTHER): Payer: Medicare Other | Admitting: Family Medicine

## 2013-06-09 ENCOUNTER — Encounter: Payer: Self-pay | Admitting: Family Medicine

## 2013-06-09 VITALS — BP 122/86 | Temp 98.4°F | Ht 63.0 in | Wt 123.0 lb

## 2013-06-09 DIAGNOSIS — E119 Type 2 diabetes mellitus without complications: Secondary | ICD-10-CM | POA: Diagnosis not present

## 2013-06-09 DIAGNOSIS — E1165 Type 2 diabetes mellitus with hyperglycemia: Secondary | ICD-10-CM

## 2013-06-09 NOTE — Patient Instructions (Signed)
Increase lantus to 16 units.  Add one unit, therefor four units total basal plus sliding scale.

## 2013-06-09 NOTE — Progress Notes (Signed)
  Subjective:    Patient ID: [name] [name], female    DOB: Jul 28, 1971, 42 y.o.   MRN: [id number]  HPI Here for a hospital follow up for abdominal pain. A1C in hospital was 10.6.   Trying to watch diet closer. No low glu since hospitaliation  Walking at least once per wk, Realizes that she needs to exercise more.  Reports most of her sugars are very high still. Though somewhat improved since hospitalization.  Reports that she is watching her diet better.  Did have abdominal surgery but still has most of her pancreas.  Early on in her diabetes she was able to manage with tablets alone with recurrent episodes of pancreatitis clearly she has become insulin dependent.   Review of Systems No chest pain no abdominal pain no shortness of breath ROS otherwise negative    Objective:   Physical Exam  Alert no acute distress. Lungs clear. Heart regular rate and rhythm. HEENT normal. Ankles without edema. Abdomen soft no discrete tenderness possible or early abdominal hernia palpated      Assessment & Plan:  Impression diabetes essentially type I equivalent at this point. Discussed at length. With significant trouble with hyperglycemia. Plan add to Lantus and add to basal insulin see notes. Referral to endocrinologist rationale discussed. WSL

## 2013-06-17 ENCOUNTER — Emergency Department (HOSPITAL_COMMUNITY): Payer: Medicare Other

## 2013-06-17 ENCOUNTER — Ambulatory Visit (INDEPENDENT_AMBULATORY_CARE_PROVIDER_SITE_OTHER): Payer: Medicare Other | Admitting: Family Medicine

## 2013-06-17 ENCOUNTER — Emergency Department (HOSPITAL_COMMUNITY)
Admission: EM | Admit: 2013-06-17 | Discharge: 2013-06-17 | Disposition: A | Discharge status: Home / Self Care | Payer: Medicare Other | Attending: Emergency Medicine | Admitting: Emergency Medicine

## 2013-06-17 ENCOUNTER — Encounter (HOSPITAL_COMMUNITY): Payer: Self-pay

## 2013-06-17 ENCOUNTER — Encounter: Payer: Self-pay | Admitting: Family Medicine

## 2013-06-17 VITALS — BP 112/78 | Temp 98.8°F | Ht 63.0 in | Wt 121.4 lb

## 2013-06-17 DIAGNOSIS — G43909 Migraine, unspecified, not intractable, without status migrainosus: Secondary | ICD-10-CM | POA: Diagnosis not present

## 2013-06-17 DIAGNOSIS — F411 Generalized anxiety disorder: Secondary | ICD-10-CM | POA: Diagnosis not present

## 2013-06-17 DIAGNOSIS — K861 Other chronic pancreatitis: Secondary | ICD-10-CM

## 2013-06-17 DIAGNOSIS — Z794 Long term (current) use of insulin: Secondary | ICD-10-CM | POA: Insufficient documentation

## 2013-06-17 DIAGNOSIS — R1012 Left upper quadrant pain: Secondary | ICD-10-CM | POA: Diagnosis not present

## 2013-06-17 DIAGNOSIS — Z79899 Other long term (current) drug therapy: Secondary | ICD-10-CM | POA: Insufficient documentation

## 2013-06-17 DIAGNOSIS — R112 Nausea with vomiting, unspecified: Secondary | ICD-10-CM | POA: Diagnosis not present

## 2013-06-17 DIAGNOSIS — Z87442 Personal history of urinary calculi: Secondary | ICD-10-CM | POA: Insufficient documentation

## 2013-06-17 DIAGNOSIS — R109 Unspecified abdominal pain: Secondary | ICD-10-CM

## 2013-06-17 DIAGNOSIS — R1032 Left lower quadrant pain: Secondary | ICD-10-CM | POA: Diagnosis not present

## 2013-06-17 DIAGNOSIS — E119 Type 2 diabetes mellitus without complications: Secondary | ICD-10-CM | POA: Insufficient documentation

## 2013-06-17 DIAGNOSIS — F319 Bipolar disorder, unspecified: Secondary | ICD-10-CM | POA: Insufficient documentation

## 2013-06-17 LAB — CBC WITH DIFFERENTIAL/PLATELET
Basophils Absolute: 0 10*3/uL (ref 0.0–0.1)
Lymphocytes Relative: 38 % (ref 12–46)
Lymphs Abs: 2.7 10*3/uL (ref 0.7–4.0)
Neutrophils Relative %: 57 % (ref 43–77)
Platelets: 193 10*3/uL (ref 150–400)
RBC: 4.57 MIL/uL (ref 3.87–5.11)
RDW: 13.3 % (ref 11.5–15.5)
WBC: 7.1 10*3/uL (ref 4.0–10.5)

## 2013-06-17 LAB — HEPATIC FUNCTION PANEL
ALT: 72 U/L — ABNORMAL HIGH (ref 0–35)
Albumin: 3.8 g/dL (ref 3.5–5.2)
Alkaline Phosphatase: 103 U/L (ref 39–117)
Indirect Bilirubin: 0.2 mg/dL — ABNORMAL LOW (ref 0.3–0.9)
Total Protein: 7.7 g/dL (ref 6.0–8.3)

## 2013-06-17 LAB — BASIC METABOLIC PANEL
CO2: 23 mEq/L (ref 19–32)
Calcium: 9.5 mg/dL (ref 8.4–10.5)
GFR calc non Af Amer: >90 mL/min (ref >90)
Glucose, Bld: 141 mg/dL — ABNORMAL HIGH (ref 70–99)
Potassium: 3.8 mEq/L (ref 3.5–5.1)
Sodium: 136 mEq/L (ref 135–145)

## 2013-06-17 LAB — LIPASE, BLOOD: Lipase: 11 U/L (ref 11–59)

## 2013-06-17 MED ORDER — HYDROMORPHONE HCL PF 2 MG/ML IJ SOLN
2.0000 mg | Freq: Once | INTRAMUSCULAR | Status: AC
  Administered 2013-06-17: 2 mg via INTRAMUSCULAR
  Filled 2013-06-17: qty 1

## 2013-06-17 MED ORDER — ONDANSETRON 8 MG PO TBDP
8.0000 mg | ORAL_TABLET | Freq: Once | ORAL | Status: AC
  Administered 2013-06-17: 8 mg via ORAL
  Filled 2013-06-17: qty 1

## 2013-06-17 MED ORDER — HYDROCODONE-ACETAMINOPHEN 5-325 MG PO TABS: 2.0000 | ORAL_TABLET | ORAL | Status: DC | PRN

## 2013-06-17 MED ORDER — PROMETHAZINE HCL 25 MG PO TABS: 25.0000 mg | ORAL_TABLET | Freq: Four times a day (QID) | ORAL | Status: DC | PRN

## 2013-06-17 NOTE — ED Notes (Signed)
Pt stated that " my pancreas is acting up", reports nausea, vomiting and pain since last night. Denies any fever or diarrhea.

## 2013-06-17 NOTE — ED Provider Notes (Signed)
CSN: [id number]     Arrival date & time 06/17/13  1215 History  This chart was scribed for Gilda Crease, MD by Greggory Stallion, ED Scribe. This patient was seen in room APA10/APA10 and the patient's care was started at [facility] PM.   Chief Complaint  Patient presents with  . Abdominal Pain  . Nausea  . Emesis   The history is provided by the patient. No language interpreter was used.    HPI Comments: [name] [name] is a 42 y.o. female with h/o chronic pancreatitis who presents to the Emergency Department complaining of lower abdominal pain that radiates to her back with associated nausea and emesis that started last night. She states she has only had one episode of emesis. Pt denies fever, trouble breathing, CP, cough, diarrhea and hematemesis as associated symptoms.   Past Medical History  Diagnosis Date  . Pancreatitis   . Diabetes mellitus   . Esophageal ulcer 1997  . Depression   . Anxiety   . Migraine headache   . Bipolar disorder   . Glucose intolerance (impaired glucose tolerance)   . Chronic pancreatitis   . Kidney stones    Past Surgical History  Procedure Laterality Date  . Whipple procedure    . Intestinal blockage    . Abdominal surgery    . Abdominal hysterectomy    . Cholecystectomy    . Cesarean section    . Colonoscopy     No family history on file. History  Substance Use Topics  . Smoking status: Never Smoker   . Smokeless tobacco: Not on file  . Alcohol Use: No   OB History   Grav Para Term Preterm Abortions TAB SAB Ect Mult Living                 Review of Systems  Constitutional: Negative for fever.  Respiratory: Negative for cough.   Cardiovascular: Negative for chest pain.  Gastrointestinal: Positive for nausea, vomiting and abdominal pain. Negative for diarrhea.  All other systems reviewed and are negative.    Allergies  Buspar and Metoclopramide hcl  Home Medications   Current Outpatient Rx  Name  Route  Sig  Dispense   Refill  . B-D ULTRAFINE III SHORT PEN 31G X 8 MM MISC               . BD INSULIN SYRINGE ULTRAFINE 31G X 5/16" 0.3 ML MISC               . dronabinol (MARINOL) 5 MG capsule   Oral   Take 5 mg by mouth 4 (four) times daily as needed (for appetite).          . DULoxetine (CYMBALTA) 60 MG capsule   Oral   Take 60 mg by mouth at bedtime.         . fluticasone (FLONASE) 50 MCG/ACT nasal spray               . gabapentin (NEURONTIN) 600 MG tablet   Oral   Take 600 mg by mouth 3 (three) times daily.         Marland Kitchen HYDROmorphone (DILAUDID) 2 MG tablet               . insulin glargine (LANTUS) 100 UNIT/ML injection   Subcutaneous   Inject 0.13 mL (13 Units total) into the skin at bedtime.   10 mL   0   . insulin lispro (HUMALOG) 100 UNIT/ML injection  Subcutaneous   Inject 4 Units into the skin 3 (three) times daily with meals.   10 mL   0   . insulin lispro (HUMALOG) 100 UNIT/ML injection      0-9 Units, Subcutaneous, 3 times daily with meals, CBG < 70: Drink or eat something sweet and recheck blood sugar. CBG 70 - 120: 0 units CBG 121 - 150: 1 unit CBG 151 - 200: 2 units CBG 201 - 250: 3 units CBG 251 - 300: 5 units CBG 301 - 350: 7 units CBG 351 - 400: 9 units CBG > 400: call MD   10 mL   1   . omeprazole (PRILOSEC) 20 MG capsule   Oral   Take 20 mg by mouth 2 (two) times daily.         . ondansetron (ZOFRAN) 4 MG tablet   Oral   Take 4 mg by mouth every 8 (eight) hours as needed for nausea.          . polyethylene glycol (MIRALAX / GLYCOLAX) packet   Oral   Take 17 g by mouth 2 (two) times daily.   30 each   0   . promethazine (PHENERGAN) 25 MG tablet               . rizatriptan (MAXALT) 10 MG tablet   Oral   Take 10 mg by mouth as needed for migraine. May repeat in 2 hours if needed         . senna (SENOKOT) 8.6 MG TABS tablet   Oral   Take 2 tablets (17.2 mg total) by mouth daily.   60 each   0   . topiramate (TOPAMAX)  25 MG tablet   Oral   Take 25 mg by mouth at bedtime.         . traZODone (DESYREL) 150 MG tablet   Oral   Take 150 mg by mouth at bedtime.          BP 112/67  Pulse 85  Temp(Src) 98.8 F (37.1 C) (Oral)  Resp 18  Ht 5\' 3"  (1.6 m)  Wt 120 lb (54.432 kg)  BMI 21.26 kg/m2  SpO2 98%  Physical Exam  Constitutional: She is oriented to person, place, and time. She appears well-developed and well-nourished. No distress.  HENT:  Head: Normocephalic and atraumatic.  Right Ear: Hearing normal.  Left Ear: Hearing normal.  Nose: Nose normal.  Mouth/Throat: Oropharynx is clear and moist and mucous membranes are normal.  Eyes: Conjunctivae and EOM are normal. Pupils are equal, round, and reactive to light.  Neck: Normal range of motion. Neck supple.  Cardiovascular: Regular rhythm, S1 normal and S2 normal.  Exam reveals no gallop and no friction rub.   No murmur heard. Pulmonary/Chest: Effort normal and breath sounds normal. No respiratory distress. She exhibits no tenderness.  Abdominal: Soft. Normal appearance and bowel sounds are normal. There is no hepatosplenomegaly. There is tenderness (LUQ). There is no rebound, no guarding, no tenderness at McBurney's point and negative Murphy's sign. No hernia.  Incision on abdomen noted.   Musculoskeletal: Normal range of motion.  Neurological: She is alert and oriented to person, place, and time. She has normal strength. No cranial nerve deficit or sensory deficit. Coordination normal. GCS eye subscore is 4. GCS verbal subscore is 5. GCS motor subscore is 6.  Skin: Skin is warm, dry and intact. No rash noted. No cyanosis.  Psychiatric: She has a normal mood and affect. Her speech  is normal and behavior is normal. Thought content normal.    ED Course  Procedures (including critical care time)  DIAGNOSTIC STUDIES: Oxygen Saturation is 98% on RA, normal by my interpretation.    COORDINATION OF CARE: 3:37 PM-Discussed treatment plan which  includes labs and xray with pt at bedside and pt agreed to plan.   Labs Review Labs Reviewed  BASIC METABOLIC PANEL - Abnormal; Notable for the following:    Glucose, Bld 141 (*)    Creatinine, Ser 0.38 (*)    All other components within normal limits  HEPATIC FUNCTION PANEL - Abnormal; Notable for the following:    AST 62 (*)    ALT 72 (*)    Indirect Bilirubin 0.2 (*)    All other components within normal limits  CBC WITH DIFFERENTIAL  LIPASE, BLOOD   Imaging Review Dg Abd Acute W/chest  06/17/2013   *RADIOLOGY REPORT*  Clinical Data: Abdominal pain  ACUTE ABDOMEN SERIES (ABDOMEN 2 VIEW & CHEST 1 VIEW)  Comparison:  March 09, 2013  Findings:  PA chest:  There is stable elevation of the right hemidiaphragm. There is no edema or consolidation.  The heart size and pulmonary vascularity are normal.  No adenopathy.  Supine and upright abdomen:  There is diffuse stool throughout the colon.  Bowel gas pattern overall is unremarkable.  No obstruction or free air.  There are surgical clips throughout the abdomen. There are phleboliths throughout the pelvis.  Impression:Large amount of stool diffusely.  Nonspecific gas pattern.  Extensive postoperative change.  No edema or consolidation.   Original Report Authenticated By: Bretta Bang, M.D.    MDM  Diagnosis: 1. Abdominal pain 2. Chronic pancreatitis 3. Constipation  Patient presents to the ER for evaluation of abdominal pain. Patient is complaining of diffuse abdominal pain with some specific pain around the area of her ventral incision as well as left upper quadrant. Patient has a history of chronic pancreatitis. Patient has only had one episode of vomiting. X-ray does not show any evidence of perforation or obstruction. Examination of the area of the incision does not reveal any concern for incarcerated hernia.  Blood work is otherwise unremarkable. This does not have a fever. Vital signs are essentially normal. X-ray did show considerable  stool, likely some of the pain secondary to constipation. Patient has a chronic pain condition that does not require hospitalization at this time. She was administered analgesia, will be discharged with limited analgesia and is to followup with her doctor.    I personally performed the services described in this documentation, which was scribed in my presence. The recorded information has been reviewed and is accurate.   Gilda Crease, MD 06/17/13 1754

## 2013-06-17 NOTE — Progress Notes (Signed)
  Subjective:    Patient ID: [name] [name], female    DOB: 1971-07-18, 42 y.o.   MRN: [id number]  Abdominal Pain This is a recurrent problem. The current episode started yesterday. The pain is located in the LLQ and suprapubic region. The pain is moderate. The quality of the pain is sharp. Associated symptoms include nausea. The pain is aggravated by palpation. The pain is relieved by nothing. She has tried nothing for the symptoms.   Very significant nausea. Vomiting. Claims of severe epigastric distress. States up since 2 or 3 this morning. No change in urinary or bowel habits.  Patient very worried about her ventral hernia. She feels that it feels tight her. No obvious swelling or mass.   Review of Systems  Gastrointestinal: Positive for nausea and abdominal pain.   no cough no congestion ROS otherwise negative     Objective:   Physical Exam  Alert no acute distress. Lungs clear. Heart regular in rhythm. HEENT normal. Very severe epigastric tenderness. Low midabdomen palpable ventral hernia. No obvious incarceration but very tender.      Assessment & Plan:  Impression #1 severe abdominal pain. #2 history of pancreatitis. #3 history of ventral hernia. Plan long discussion held with patient. 25 minutes spent most in discussion. We also called the emergency room. This represents an acute abdomen. Further management is very warranted. WSL

## 2013-06-18 ENCOUNTER — Telehealth: Payer: Self-pay | Admitting: Family Medicine

## 2013-06-18 DIAGNOSIS — Z79899 Other long term (current) drug therapy: Secondary | ICD-10-CM | POA: Diagnosis not present

## 2013-06-18 DIAGNOSIS — R1032 Left lower quadrant pain: Secondary | ICD-10-CM | POA: Diagnosis not present

## 2013-06-18 DIAGNOSIS — K859 Acute pancreatitis without necrosis or infection, unspecified: Secondary | ICD-10-CM | POA: Diagnosis not present

## 2013-06-18 NOTE — Telephone Encounter (Signed)
Consult with Dr. Brett Canales- pt stated she is in worse pain today. Advised pt to return to ER.

## 2013-06-18 NOTE — Telephone Encounter (Signed)
Patient says when she was sent to the hospital yesterday, the hospital did not do what they were supposed to do. She would like a nurse to call her.

## 2013-06-23 ENCOUNTER — Encounter (HOSPITAL_COMMUNITY): Payer: Self-pay | Admitting: Emergency Medicine

## 2013-06-23 ENCOUNTER — Emergency Department (HOSPITAL_COMMUNITY)
Admission: EM | Admit: 2013-06-23 | Discharge: 2013-06-24 | Disposition: A | Discharge status: Home / Self Care | Payer: Medicare Other | Attending: Emergency Medicine | Admitting: Emergency Medicine

## 2013-06-23 DIAGNOSIS — R109 Unspecified abdominal pain: Secondary | ICD-10-CM

## 2013-06-23 DIAGNOSIS — R1012 Left upper quadrant pain: Secondary | ICD-10-CM | POA: Insufficient documentation

## 2013-06-23 DIAGNOSIS — Z9089 Acquired absence of other organs: Secondary | ICD-10-CM | POA: Insufficient documentation

## 2013-06-23 DIAGNOSIS — Z794 Long term (current) use of insulin: Secondary | ICD-10-CM | POA: Insufficient documentation

## 2013-06-23 DIAGNOSIS — R112 Nausea with vomiting, unspecified: Secondary | ICD-10-CM | POA: Diagnosis not present

## 2013-06-23 DIAGNOSIS — R197 Diarrhea, unspecified: Secondary | ICD-10-CM | POA: Insufficient documentation

## 2013-06-23 DIAGNOSIS — F319 Bipolar disorder, unspecified: Secondary | ICD-10-CM | POA: Diagnosis not present

## 2013-06-23 DIAGNOSIS — G43909 Migraine, unspecified, not intractable, without status migrainosus: Secondary | ICD-10-CM | POA: Diagnosis not present

## 2013-06-23 DIAGNOSIS — E119 Type 2 diabetes mellitus without complications: Secondary | ICD-10-CM | POA: Diagnosis not present

## 2013-06-23 DIAGNOSIS — Z87442 Personal history of urinary calculi: Secondary | ICD-10-CM | POA: Diagnosis not present

## 2013-06-23 DIAGNOSIS — Z8719 Personal history of other diseases of the digestive system: Secondary | ICD-10-CM | POA: Diagnosis not present

## 2013-06-23 DIAGNOSIS — Z79899 Other long term (current) drug therapy: Secondary | ICD-10-CM | POA: Diagnosis not present

## 2013-06-23 DIAGNOSIS — F411 Generalized anxiety disorder: Secondary | ICD-10-CM | POA: Insufficient documentation

## 2013-06-23 DIAGNOSIS — Z90710 Acquired absence of both cervix and uterus: Secondary | ICD-10-CM | POA: Diagnosis not present

## 2013-06-23 DIAGNOSIS — Z9889 Other specified postprocedural states: Secondary | ICD-10-CM | POA: Diagnosis not present

## 2013-06-23 DIAGNOSIS — R1013 Epigastric pain: Secondary | ICD-10-CM | POA: Diagnosis not present

## 2013-06-23 MED ORDER — HYDROMORPHONE HCL PF 1 MG/ML IJ SOLN
1.0000 mg | Freq: Once | INTRAMUSCULAR | Status: AC
  Administered 2013-06-23: 1 mg via INTRAVENOUS
  Filled 2013-06-23: qty 1

## 2013-06-23 MED ORDER — ONDANSETRON HCL 4 MG/2ML IJ SOLN
4.0000 mg | Freq: Once | INTRAMUSCULAR | Status: AC
  Administered 2013-06-23: 4 mg via INTRAVENOUS
  Filled 2013-06-23: qty 2

## 2013-06-23 MED ORDER — SODIUM CHLORIDE 0.9 % IV BOLUS (SEPSIS)
1000.0000 mL | Freq: Once | INTRAVENOUS | Status: AC
  Administered 2013-06-23: 1000 mL via INTRAVENOUS

## 2013-06-23 NOTE — ED Provider Notes (Signed)
CSN: [id number]     Arrival date & time 06/23/13  1821 History  This chart was scribed for Geoffery Lyons, MD by Bennett Scrape, ED Scribe. This patient was seen in room [facility] and the patient's care was started at 11:01 PM.    Chief Complaint  Patient presents with  . Abdominal Pain  . Nausea  . Emesis  . Diarrhea    (The history is provided by the patient. No language interpreter was used.   HPI Comments: [name] [name] is a 42 y.o. female who presents to the Emergency Department complaining of LUQ abdominal pain with associated nausea, emesis and diarrhea for the past 5 days. She reports having a whipple procedure done due to chronic pancreatitis with 6 subsequent surgeries since. Pt states that she spoke with her GI specialist at [facility] and is requesting to be transferred due to the possibility of post-surgery complications. She states that she was instructed to come to the [facility] to get an IV started and she also admits that she was not able to acquire a ride to [facility] [employer]. She admits that she has been seen for her chronic abdominal pain since her surgeries. Last visit for the same was 5 days ago and she had an unremarkable blood and radiology work up. She states that in the past refraining from eating and drinking and resting improves her symptoms usually but denies improvement with this episode. She denies fevers and urinary symptoms as associated symptoms.   Past Medical History  Diagnosis Date  . Pancreatitis   . Diabetes mellitus   . Esophageal ulcer 1997  . Depression   . Anxiety   . Migraine headache   . Bipolar disorder   . Glucose intolerance (impaired glucose tolerance)   . Chronic pancreatitis   . Kidney stones    Past Surgical History  Procedure Laterality Date  . Whipple procedure    . Intestinal blockage    . Abdominal surgery    . Abdominal hysterectomy    . Cholecystectomy    . Cesarean section    . Colonoscopy     History reviewed. No pertinent family  history. History  Substance Use Topics  . Smoking status: Never Smoker   . Smokeless tobacco: Not on file  . Alcohol Use: No   No OB history provided.  Review of Systems  Constitutional: Negative for fever.  Gastrointestinal: Positive for nausea, vomiting, abdominal pain and diarrhea. Negative for blood in stool.  Genitourinary: Negative for dysuria and frequency.  All other systems reviewed and are negative.    Allergies  Buspar and Metoclopramide hcl  Home Medications   Current Outpatient Rx  Name  Route  Sig  Dispense  Refill  . B-D ULTRAFINE III SHORT PEN 31G X 8 MM MISC               . BD INSULIN SYRINGE ULTRAFINE 31G X 5/16" 0.3 ML MISC               . dronabinol (MARINOL) 5 MG capsule   Oral   Take 5 mg by mouth 4 (four) times daily as needed (for appetite).          . DULoxetine (CYMBALTA) 60 MG capsule   Oral   Take 60 mg by mouth at bedtime.         . fluticasone (FLONASE) 50 MCG/ACT nasal spray               . gabapentin (NEURONTIN)  600 MG tablet   Oral   Take 600 mg by mouth 3 (three) times daily.         Marland Kitchen HYDROcodone-acetaminophen (NORCO/VICODIN) 5-325 MG per tablet   Oral   Take 2 tablets by mouth every 4 (four) hours as needed for pain.   10 tablet   0   . HYDROmorphone (DILAUDID) 2 MG tablet               . insulin glargine (LANTUS) 100 UNIT/ML injection   Subcutaneous   Inject 0.13 mL (13 Units total) into the skin at bedtime.   10 mL   0   . insulin lispro (HUMALOG) 100 UNIT/ML injection   Subcutaneous   Inject 4 Units into the skin 3 (three) times daily with meals.   10 mL   0   . insulin lispro (HUMALOG) 100 UNIT/ML injection      0-9 Units, Subcutaneous, 3 times daily with meals, CBG < 70: Drink or eat something sweet and recheck blood sugar. CBG 70 - 120: 0 units CBG 121 - 150: 1 unit CBG 151 - 200: 2 units CBG 201 - 250: 3 units CBG 251 - 300: 5 units CBG 301 - 350: 7 units CBG 351 - 400: 9  units CBG > 400: call MD   10 mL   1   . omeprazole (PRILOSEC) 20 MG capsule   Oral   Take 20 mg by mouth 2 (two) times daily.         . ondansetron (ZOFRAN) 4 MG tablet   Oral   Take 4 mg by mouth every 8 (eight) hours as needed for nausea.          . polyethylene glycol (MIRALAX / GLYCOLAX) packet   Oral   Take 17 g by mouth 2 (two) times daily.   30 each   0   . promethazine (PHENERGAN) 25 MG tablet               . promethazine (PHENERGAN) 25 MG tablet   Oral   Take 1 tablet (25 mg total) by mouth every 6 (six) hours as needed for nausea.   30 tablet   0   . rizatriptan (MAXALT) 10 MG tablet   Oral   Take 10 mg by mouth as needed for migraine. May repeat in 2 hours if needed         . senna (SENOKOT) 8.6 MG TABS tablet   Oral   Take 2 tablets (17.2 mg total) by mouth daily.   60 each   0   . topiramate (TOPAMAX) 25 MG tablet   Oral   Take 25 mg by mouth at bedtime.         . traZODone (DESYREL) 150 MG tablet   Oral   Take 150 mg by mouth at bedtime.          Triage Vitals: BP 112/86  Pulse 100  Temp(Src) 99 F (37.2 C) (Oral)  Resp 20  Ht 5\' 3"  (1.6 m)  Wt 120 lb (54.432 kg)  BMI 21.26 kg/m2  SpO2 100%  Physical Exam  Nursing note and vitals reviewed. Constitutional: She is oriented to person, place, and time. She appears well-developed and well-nourished. No distress.  HENT:  Head: Normocephalic and atraumatic.  Mouth/Throat: Oropharynx is clear and moist.  Eyes: Conjunctivae and EOM are normal. Pupils are equal, round, and reactive to light.  Neck: Normal range of motion. Neck supple. No tracheal deviation present.  Cardiovascular: Normal rate, regular rhythm and normal heart sounds.   No murmur heard. Pulmonary/Chest: Effort normal and breath sounds normal. No respiratory distress. She has no wheezes. She has no rales.  Abdominal: Soft. Bowel sounds are normal. There is tenderness. There is no rebound and no guarding.  There is  tenderness to palpation in the epigastric and LUQ.   Musculoskeletal: Normal range of motion. She exhibits no edema.  Neurological: She is alert and oriented to person, place, and time. No cranial nerve deficit.  Skin: Skin is warm and dry.  Psychiatric: She has a normal mood and affect. Her behavior is normal.    ED Course  Procedures (including critical care time)  Medications  sodium chloride 0.9 % bolus 1,000 mL (not administered)  HYDROmorphone (DILAUDID) injection 1 mg (not administered)  ondansetron (ZOFRAN) injection 4 mg (not administered)    DIAGNOSTIC STUDIES: Oxygen Saturation is 100% on room air, normal by my interpretation.    COORDINATION OF CARE: 11:05 PM-Advised pt that transfer may not be possible depending on the results of her work up. Pt verbalized her understanding. Discussed treatment plan which includes pain medications, IV fluids, CBC panel, CMP and UA with pt at bedside and pt agreed to plan.   Labs Review Labs Reviewed  URINALYSIS, ROUTINE W REFLEX MICROSCOPIC   Imaging Review No results found.  MDM  No diagnosis found. This patient has a history of Whipple procedure in the past. She presents with abdominal discomfort that is most prevalent in the epigastric region without peritoneal signs or evidence for an acute abdomen. Workup reveals no elevation of white count and her lipase is normal. She was given fluids and pain medications and appears well. She is requesting to be transferred to [facility] however I see no emergent indication for this. Apparently her gastroenterologist is there. I have advised to call him tomorrow to arrange an appointment.  I personally performed the services described in this documentation, which was scribed in my presence. The recorded information has been reviewed and is accurate.      Geoffery Lyons, MD 06/24/13 [phone]

## 2013-06-23 NOTE — ED Notes (Signed)
Pt c/o abd pain with n/v/d since Thursday.

## 2013-06-23 NOTE — ED Notes (Signed)
Pt states she has talked w/ her MD at [facility] & wants transferred there.

## 2013-06-24 DIAGNOSIS — Z794 Long term (current) use of insulin: Secondary | ICD-10-CM | POA: Diagnosis not present

## 2013-06-24 DIAGNOSIS — Z9119 Patient's noncompliance with other medical treatment and regimen: Secondary | ICD-10-CM | POA: Diagnosis not present

## 2013-06-24 DIAGNOSIS — R109 Unspecified abdominal pain: Secondary | ICD-10-CM | POA: Diagnosis not present

## 2013-06-24 DIAGNOSIS — K7689 Other specified diseases of liver: Secondary | ICD-10-CM | POA: Diagnosis present

## 2013-06-24 DIAGNOSIS — Z90410 Acquired total absence of pancreas: Secondary | ICD-10-CM | POA: Diagnosis not present

## 2013-06-24 DIAGNOSIS — K6389 Other specified diseases of intestine: Secondary | ICD-10-CM | POA: Diagnosis not present

## 2013-06-24 DIAGNOSIS — E119 Type 2 diabetes mellitus without complications: Secondary | ICD-10-CM | POA: Diagnosis present

## 2013-06-24 DIAGNOSIS — K3184 Gastroparesis: Secondary | ICD-10-CM | POA: Diagnosis not present

## 2013-06-24 DIAGNOSIS — K5900 Constipation, unspecified: Secondary | ICD-10-CM | POA: Diagnosis not present

## 2013-06-24 DIAGNOSIS — R112 Nausea with vomiting, unspecified: Secondary | ICD-10-CM | POA: Diagnosis not present

## 2013-06-24 DIAGNOSIS — F319 Bipolar disorder, unspecified: Secondary | ICD-10-CM | POA: Diagnosis present

## 2013-06-24 DIAGNOSIS — Z86718 Personal history of other venous thrombosis and embolism: Secondary | ICD-10-CM | POA: Diagnosis not present

## 2013-06-24 DIAGNOSIS — R935 Abnormal findings on diagnostic imaging of other abdominal regions, including retroperitoneum: Secondary | ICD-10-CM | POA: Diagnosis present

## 2013-06-24 DIAGNOSIS — Z9089 Acquired absence of other organs: Secondary | ICD-10-CM | POA: Diagnosis not present

## 2013-06-24 DIAGNOSIS — R110 Nausea: Secondary | ICD-10-CM | POA: Diagnosis not present

## 2013-06-24 DIAGNOSIS — K5909 Other constipation: Secondary | ICD-10-CM | POA: Diagnosis present

## 2013-06-24 DIAGNOSIS — R1013 Epigastric pain: Secondary | ICD-10-CM | POA: Diagnosis not present

## 2013-06-24 LAB — CBC WITH DIFFERENTIAL/PLATELET
HCT: 38.1 % (ref 36.0–46.0)
Hemoglobin: 12.8 g/dL (ref 12.0–15.0)
Lymphocytes Relative: 48 % — ABNORMAL HIGH (ref 12–46)
Lymphs Abs: 2.9 10*3/uL (ref 0.7–4.0)
Monocytes Absolute: 0.4 10*3/uL (ref 0.1–1.0)
Monocytes Relative: 7 % (ref 3–12)
Neutro Abs: 2.7 10*3/uL (ref 1.7–7.7)
Neutrophils Relative %: 44 % (ref 43–77)
RBC: 4.03 MIL/uL (ref 3.87–5.11)

## 2013-06-24 LAB — URINALYSIS, ROUTINE W REFLEX MICROSCOPIC
Glucose, UA: NEGATIVE mg/dL
Ketones, ur: NEGATIVE mg/dL
Nitrite: NEGATIVE
Specific Gravity, Urine: >1.03 — ABNORMAL HIGH (ref 1.005–1.030)
pH: 6 (ref 5.0–8.0)

## 2013-06-24 LAB — COMPREHENSIVE METABOLIC PANEL
Albumin: 3.2 g/dL — ABNORMAL LOW (ref 3.5–5.2)
Alkaline Phosphatase: 103 U/L (ref 39–117)
BUN: 10 mg/dL (ref 6–23)
CO2: 21 mEq/L (ref 19–32)
Chloride: 106 mEq/L (ref 96–112)
Creatinine, Ser: 0.3 mg/dL — ABNORMAL LOW (ref 0.50–1.10)
GFR calc non Af Amer: >90 mL/min (ref >90)
Glucose, Bld: 183 mg/dL — ABNORMAL HIGH (ref 70–99)
Potassium: 3.5 mEq/L (ref 3.5–5.1)
Total Bilirubin: 0.3 mg/dL (ref 0.3–1.2)

## 2013-06-24 LAB — LIPASE, BLOOD: Lipase: 9 U/L — ABNORMAL LOW (ref 11–59)

## 2013-06-24 LAB — URINE MICROSCOPIC-ADD ON

## 2013-06-24 NOTE — ED Notes (Signed)
Pt alert & oriented x4, stable gait. Patient given discharge instructions, paperwork & prescription(s). Patient  instructed to stop at the registration desk to finish any additional paperwork. Patient verbalized understanding. Pt left department w/ no further questions. 

## 2013-07-02 NOTE — Telephone Encounter (Signed)
Has this been completed? If so, please close encounter. It will not allow me to do so.

## 2013-07-07 ENCOUNTER — Telehealth: Payer: Self-pay | Admitting: Family Medicine

## 2013-07-07 NOTE — Telephone Encounter (Signed)
FYI - Patient "No Showed" appt for this morning with Dr. Fransico Him

## 2013-07-28 NOTE — Telephone Encounter (Signed)
Per Dr. Arita Miss pt, ask reason for "no show" to Dr. Fransico Him and if she rescheduled.  [facility] on voicemail.

## 2013-08-03 DIAGNOSIS — Z8719 Personal history of other diseases of the digestive system: Secondary | ICD-10-CM | POA: Diagnosis not present

## 2013-08-03 DIAGNOSIS — R109 Unspecified abdominal pain: Secondary | ICD-10-CM | POA: Diagnosis not present

## 2013-08-03 DIAGNOSIS — Z79899 Other long term (current) drug therapy: Secondary | ICD-10-CM | POA: Diagnosis not present

## 2013-08-06 ENCOUNTER — Emergency Department (HOSPITAL_COMMUNITY)
Admission: EM | Admit: 2013-08-06 | Discharge: 2013-08-06 | Disposition: A | Discharge status: Home / Self Care | Payer: Medicare Other | Attending: Emergency Medicine | Admitting: Emergency Medicine

## 2013-08-06 ENCOUNTER — Encounter (HOSPITAL_COMMUNITY): Payer: Self-pay | Admitting: Emergency Medicine

## 2013-08-06 DIAGNOSIS — R112 Nausea with vomiting, unspecified: Secondary | ICD-10-CM | POA: Insufficient documentation

## 2013-08-06 DIAGNOSIS — E1065 Type 1 diabetes mellitus with hyperglycemia: Secondary | ICD-10-CM | POA: Diagnosis not present

## 2013-08-06 DIAGNOSIS — K2210 Ulcer of esophagus without bleeding: Secondary | ICD-10-CM | POA: Diagnosis not present

## 2013-08-06 DIAGNOSIS — G43909 Migraine, unspecified, not intractable, without status migrainosus: Secondary | ICD-10-CM | POA: Insufficient documentation

## 2013-08-06 DIAGNOSIS — E119 Type 2 diabetes mellitus without complications: Secondary | ICD-10-CM | POA: Diagnosis not present

## 2013-08-06 DIAGNOSIS — K861 Other chronic pancreatitis: Secondary | ICD-10-CM | POA: Insufficient documentation

## 2013-08-06 DIAGNOSIS — F319 Bipolar disorder, unspecified: Secondary | ICD-10-CM | POA: Insufficient documentation

## 2013-08-06 DIAGNOSIS — IMO0002 Reserved for concepts with insufficient information to code with codable children: Secondary | ICD-10-CM | POA: Insufficient documentation

## 2013-08-06 DIAGNOSIS — Z794 Long term (current) use of insulin: Secondary | ICD-10-CM | POA: Diagnosis not present

## 2013-08-06 DIAGNOSIS — Z87442 Personal history of urinary calculi: Secondary | ICD-10-CM | POA: Diagnosis not present

## 2013-08-06 DIAGNOSIS — F411 Generalized anxiety disorder: Secondary | ICD-10-CM | POA: Insufficient documentation

## 2013-08-06 DIAGNOSIS — Z79899 Other long term (current) drug therapy: Secondary | ICD-10-CM | POA: Diagnosis not present

## 2013-08-06 MED ORDER — PROMETHAZINE HCL 25 MG/ML IJ SOLN
12.5000 mg | Freq: Once | INTRAMUSCULAR | Status: AC
  Administered 2013-08-06: 12.5 mg via INTRAVENOUS
  Filled 2013-08-06: qty 1

## 2013-08-06 MED ORDER — SODIUM CHLORIDE 0.9 % IV SOLN
INTRAVENOUS | Status: DC
  Administered 2013-08-06: 1000 mL via INTRAVENOUS

## 2013-08-06 MED ORDER — DIPHENHYDRAMINE HCL 50 MG/ML IJ SOLN
25.0000 mg | Freq: Once | INTRAMUSCULAR | Status: AC
  Administered 2013-08-06: 25 mg via INTRAVENOUS
  Filled 2013-08-06: qty 1

## 2013-08-06 MED ORDER — DEXAMETHASONE SODIUM PHOSPHATE 4 MG/ML IJ SOLN
10.0000 mg | Freq: Once | INTRAMUSCULAR | Status: AC
  Administered 2013-08-06: 10 mg via INTRAVENOUS
  Filled 2013-08-06: qty 3

## 2013-08-06 NOTE — ED Notes (Signed)
Pt requesting Dilaudid for pain. [facility] aware.

## 2013-08-06 NOTE — ED Notes (Signed)
Migraine headache times 2 days

## 2013-08-06 NOTE — ED Notes (Signed)
Pt calling for ride

## 2013-08-06 NOTE — ED Provider Notes (Signed)
Medical screening examination/treatment/procedure(s) were performed by non-physician practitioner and as supervising physician I was immediately available for consultation/collaboration.    Vida Roller, MD 08/06/13 [phone]

## 2013-08-06 NOTE — ED Provider Notes (Signed)
CSN: [id number]     Arrival date & time 08/06/13  8657 History   First MD Initiated Contact with Patient 08/06/13 [phone]     Chief Complaint  Patient presents with  . Migraine   (Consider location/radiation/quality/duration/timing/severity/associated sxs/prior Treatment) Patient is a 42 y.o. female presenting with migraines. The history is provided by the patient.  Migraine This is a new problem. The current episode started yesterday. The problem occurs constantly. The problem has been unchanged. Associated symptoms include headaches, nausea and vomiting. Pertinent negatives include no congestion, rash or sore throat. The symptoms are aggravated by standing. She has tried nothing for the symptoms.    Past Medical History  Diagnosis Date  . Pancreatitis   . Diabetes mellitus   . Esophageal ulcer 1997  . Depression   . Anxiety   . Migraine headache   . Bipolar disorder     bipolar was a misdiagnoses   . Glucose intolerance (impaired glucose tolerance)   . Chronic pancreatitis   . Kidney stones    Past Surgical History  Procedure Laterality Date  . Whipple procedure    . Intestinal blockage    . Abdominal surgery    . Abdominal hysterectomy    . Cholecystectomy    . Cesarean section    . Colonoscopy     No family history on file. History  Substance Use Topics  . Smoking status: Never Smoker   . Smokeless tobacco: Not on file  . Alcohol Use: No   OB History   Grav Para Term Preterm Abortions TAB SAB Ect Mult Living                 Review of Systems  HENT: Negative for congestion, ear pain and sore throat.   Eyes: Positive for photophobia. Negative for pain and itching.  Gastrointestinal: Positive for nausea and vomiting.  Genitourinary: Negative for dysuria, urgency and frequency.  Musculoskeletal: Negative for back pain.  Skin: Negative for rash.  Allergic/Immunologic: Negative for environmental allergies and food allergies.  Neurological: Positive for headaches.   Psychiatric/Behavioral: The patient is not nervous/anxious.     Allergies  Buspar and Metoclopramide hcl  Home Medications   Current Outpatient Rx  Name  Route  Sig  Dispense  Refill  . B-D ULTRAFINE III SHORT PEN 31G X 8 MM MISC               . BD INSULIN SYRINGE ULTRAFINE 31G X 5/16" 0.3 ML MISC               . dronabinol (MARINOL) 5 MG capsule   Oral   Take 5 mg by mouth 4 (four) times daily as needed (for appetite).          . DULoxetine (CYMBALTA) 60 MG capsule   Oral   Take 60 mg by mouth at bedtime.         . fluticasone (FLONASE) 50 MCG/ACT nasal spray               . gabapentin (NEURONTIN) 600 MG tablet   Oral   Take 600 mg by mouth 3 (three) times daily.         Marland Kitchen HYDROcodone-acetaminophen (NORCO/VICODIN) 5-325 MG per tablet   Oral   Take 2 tablets by mouth every 4 (four) hours as needed for pain.   10 tablet   0   . HYDROmorphone (DILAUDID) 2 MG tablet               .  insulin glargine (LANTUS) 100 UNIT/ML injection   Subcutaneous   Inject 0.13 mL (13 Units total) into the skin at bedtime.   10 mL   0   . insulin lispro (HUMALOG) 100 UNIT/ML injection   Subcutaneous   Inject 4 Units into the skin 3 (three) times daily with meals.   10 mL   0   . insulin lispro (HUMALOG) 100 UNIT/ML injection      0-9 Units, Subcutaneous, 3 times daily with meals, CBG < 70: Drink or eat something sweet and recheck blood sugar. CBG 70 - 120: 0 units CBG 121 - 150: 1 unit CBG 151 - 200: 2 units CBG 201 - 250: 3 units CBG 251 - 300: 5 units CBG 301 - 350: 7 units CBG 351 - 400: 9 units CBG > 400: call MD   10 mL   1   . omeprazole (PRILOSEC) 20 MG capsule   Oral   Take 20 mg by mouth 2 (two) times daily.         . ondansetron (ZOFRAN) 4 MG tablet   Oral   Take 4 mg by mouth every 8 (eight) hours as needed for nausea.          . polyethylene glycol (MIRALAX / GLYCOLAX) packet   Oral   Take 17 g by mouth 2 (two) times daily.    30 each   0   . promethazine (PHENERGAN) 25 MG tablet               . promethazine (PHENERGAN) 25 MG tablet   Oral   Take 1 tablet (25 mg total) by mouth every 6 (six) hours as needed for nausea.   30 tablet   0   . rizatriptan (MAXALT) 10 MG tablet   Oral   Take 10 mg by mouth as needed for migraine. May repeat in 2 hours if needed         . senna (SENOKOT) 8.6 MG TABS tablet   Oral   Take 2 tablets (17.2 mg total) by mouth daily.   60 each   0   . topiramate (TOPAMAX) 25 MG tablet   Oral   Take 25 mg by mouth at bedtime.         . traZODone (DESYREL) 150 MG tablet   Oral   Take 150 mg by mouth at bedtime.          BP 110/60  Pulse 89  Temp(Src) 97.9 F (36.6 C) (Oral)  Ht 5\' 3"  (1.6 m)  Wt 118 lb (53.524 kg)  BMI 20.91 kg/m2  SpO2 96% Physical Exam  Nursing note and vitals reviewed. Constitutional: She is oriented to person, place, and time. She appears well-developed and well-nourished.  HENT:  Head: Normocephalic and atraumatic.  Eyes: Conjunctivae and EOM are normal. Pupils are equal, round, and reactive to light.  Neck: Normal range of motion. Neck supple.  Cardiovascular: Normal rate and regular rhythm.   Pulmonary/Chest: Effort normal and breath sounds normal.  Abdominal: Soft. There is no tenderness.  Musculoskeletal: Normal range of motion.  Neurological: She is alert and oriented to person, place, and time. She has normal strength and normal reflexes. No cranial nerve deficit or sensory deficit. She displays a negative Romberg sign. Coordination and gait normal.  Ambulatory with steady gait.   Skin: Skin is warm and dry.  Psychiatric: She has a normal mood and affect. Her behavior is normal.    ED Course  Procedures After  IV hydration, Benadryl 25 mg., Decadron 10 mg. And Phenergan 12.5 mg IV patient re examined and has a normal neuro exam. She is texting on her phone and requesting Dilaudid.   Discussed with Dr. Hyacinth Meeker. Will not give  dilaudid.   42 y.o. female with headache that is typical of her usual migraine headache. Treated with migraine cocktail. Will d/c home and she is to follow up with her PCP.  Discussed with the patient and all questioned fully answered.  Janne Napoleon, NP 08/06/13 [phone]

## 2013-08-12 ENCOUNTER — Encounter (HOSPITAL_COMMUNITY): Payer: Self-pay | Admitting: Emergency Medicine

## 2013-08-12 ENCOUNTER — Inpatient Hospital Stay (HOSPITAL_COMMUNITY)
Admission: EM | Admit: 2013-08-12 | Discharge: 2013-08-14 | DRG: 392 | Disposition: A | Discharge status: Home / Self Care | Payer: Medicare Other | Attending: Internal Medicine | Admitting: Internal Medicine

## 2013-08-12 DIAGNOSIS — F411 Generalized anxiety disorder: Secondary | ICD-10-CM | POA: Diagnosis present

## 2013-08-12 DIAGNOSIS — R1084 Generalized abdominal pain: Secondary | ICD-10-CM | POA: Diagnosis not present

## 2013-08-12 DIAGNOSIS — Z833 Family history of diabetes mellitus: Secondary | ICD-10-CM

## 2013-08-12 DIAGNOSIS — K5900 Constipation, unspecified: Principal | ICD-10-CM

## 2013-08-12 DIAGNOSIS — Z23 Encounter for immunization: Secondary | ICD-10-CM

## 2013-08-12 DIAGNOSIS — K861 Other chronic pancreatitis: Secondary | ICD-10-CM | POA: Diagnosis present

## 2013-08-12 DIAGNOSIS — G8929 Other chronic pain: Secondary | ICD-10-CM

## 2013-08-12 DIAGNOSIS — E119 Type 2 diabetes mellitus without complications: Secondary | ICD-10-CM

## 2013-08-12 DIAGNOSIS — R109 Unspecified abdominal pain: Secondary | ICD-10-CM

## 2013-08-12 DIAGNOSIS — Z8719 Personal history of other diseases of the digestive system: Secondary | ICD-10-CM

## 2013-08-12 DIAGNOSIS — Z90411 Acquired partial absence of pancreas: Secondary | ICD-10-CM

## 2013-08-12 DIAGNOSIS — F319 Bipolar disorder, unspecified: Secondary | ICD-10-CM

## 2013-08-12 DIAGNOSIS — K5903 Drug induced constipation: Secondary | ICD-10-CM | POA: Diagnosis present

## 2013-08-12 DIAGNOSIS — K219 Gastro-esophageal reflux disease without esophagitis: Secondary | ICD-10-CM

## 2013-08-12 DIAGNOSIS — R339 Retention of urine, unspecified: Secondary | ICD-10-CM

## 2013-08-12 DIAGNOSIS — Z794 Long term (current) use of insulin: Secondary | ICD-10-CM

## 2013-08-12 LAB — URINE MICROSCOPIC-ADD ON

## 2013-08-12 LAB — URINALYSIS, ROUTINE W REFLEX MICROSCOPIC
Glucose, UA: NEGATIVE mg/dL
Leukocytes, UA: NEGATIVE
Nitrite: NEGATIVE
Specific Gravity, Urine: 1.01 (ref 1.005–1.030)
pH: 6 (ref 5.0–8.0)

## 2013-08-12 LAB — CBC WITH DIFFERENTIAL/PLATELET
Basophils Relative: 0 % (ref 0–1)
Eosinophils Absolute: 0 10*3/uL (ref 0.0–0.7)
HCT: 39.7 % (ref 36.0–46.0)
Hemoglobin: 13.4 g/dL (ref 12.0–15.0)
MCH: 32.2 pg (ref 26.0–34.0)
MCHC: 33.8 g/dL (ref 30.0–36.0)
MCV: 95.4 fL (ref 78.0–100.0)
Monocytes Absolute: 0.4 10*3/uL (ref 0.1–1.0)
Monocytes Relative: 5 % (ref 3–12)
Neutrophils Relative %: 63 % (ref 43–77)
RDW: 13.2 % (ref 11.5–15.5)

## 2013-08-12 LAB — COMPREHENSIVE METABOLIC PANEL
ALT: 61 U/L — ABNORMAL HIGH (ref 0–35)
Albumin: 3.3 g/dL — ABNORMAL LOW (ref 3.5–5.2)
BUN: 5 mg/dL — ABNORMAL LOW (ref 6–23)
Creatinine, Ser: 0.36 mg/dL — ABNORMAL LOW (ref 0.50–1.10)
Total Protein: 6.6 g/dL (ref 6.0–8.3)

## 2013-08-12 LAB — LIPASE, BLOOD: Lipase: 14 U/L (ref 11–59)

## 2013-08-12 MED ORDER — SODIUM CHLORIDE 0.9 % IV SOLN: INTRAVENOUS | Status: DC

## 2013-08-12 MED ORDER — HYDROMORPHONE HCL PF 1 MG/ML IJ SOLN
1.0000 mg | Freq: Once | INTRAMUSCULAR | Status: AC
  Administered 2013-08-12: 1 mg via INTRAVENOUS
  Filled 2013-08-12: qty 1

## 2013-08-12 MED ORDER — PROMETHAZINE HCL 25 MG/ML IJ SOLN
25.0000 mg | Freq: Once | INTRAMUSCULAR | Status: AC
  Administered 2013-08-12: 25 mg via INTRAVENOUS
  Filled 2013-08-12: qty 1

## 2013-08-12 MED ORDER — SODIUM CHLORIDE 0.9 % IV BOLUS (SEPSIS)
2000.0000 mL | Freq: Once | INTRAVENOUS | Status: AC
  Administered 2013-08-12: 2000 mL via INTRAVENOUS

## 2013-08-12 NOTE — ED Notes (Signed)
Iv attempted x 3 by two different nurses, no success.

## 2013-08-12 NOTE — ED Provider Notes (Signed)
CSN: [id number]     Arrival date & time 08/12/13  1709 History   None   Scribed for No att. providers found, the patient was seen in room A321/A321-01. This chart was scribed by Lewanda Rife, ED scribe. Patient's care was started at 9:13 PM  Chief Complaint  Patient presents with  . Pancreatitis   (Consider location/radiation/quality/duration/timing/severity/associated sxs/prior Treatment) The history is provided by the patient and medical records. No language interpreter was used.   HPI Comments: [name] [name] is a 42 y.o. 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 Describes abdominal pain as unchanged from previous flare ups. Reports associated nausea, non-bloody emesis.  Denies any aggravating or alleviating factors. Reports having 1 normal BM today. Denies associated fever, diarrhea, constipation, bloody stools, cough, dysuria, hematuria, vaginal discharge, vaginal bleeding, shortness of breath, and chest pain. Reports she has not had narcotics for 6 months and has had meds intermittently while at hospital.  Last admission for similar symptoms was 05/19/13 and d/c summary states chronic mild elevation of liver enzymes.   PCP [name]  Pt has had 6 unremarkable abdominal/pelvis CT scans in the last 2 years.  Past Medical History  Diagnosis Date  . Pancreatitis   . Diabetes mellitus   . Esophageal ulcer 1997  . Depression   . Anxiety   . Migraine headache   . Bipolar disorder     bipolar was a misdiagnoses   . Glucose intolerance (impaired glucose tolerance)   . Chronic pancreatitis   . Kidney stones    Past Surgical History  Procedure Laterality Date  . Whipple procedure    . Intestinal blockage    . Abdominal surgery    . Abdominal hysterectomy    . Cholecystectomy    .  Cesarean section    . Colonoscopy     History reviewed. No pertinent family history. History  Substance Use Topics  . Smoking status: Never Smoker   . Smokeless tobacco: Not on file  . Alcohol Use: No   OB History   Grav Para Term Preterm Abortions TAB SAB Ect Mult Living                 Review of Systems  Constitutional: Negative for fever.  Gastrointestinal: Positive for nausea and abdominal pain.  All other systems reviewed and are negative.  10 Systems reviewed and all are negative for acute change except as noted in the HPI.     Allergies  Buspar and Metoclopramide hcl  Home Medications   Current Outpatient Rx  Name  Route  Sig  Dispense  Refill  . CREON 24000 UNITS CPEP   Oral   Take 24,000 Units by mouth 3 (three) times daily.          . DULoxetine (CYMBALTA) 60 MG capsule   Oral   Take 60 mg by mouth 2 (two) times daily.          . insulin glargine (LANTUS) 100 UNIT/ML injection   Subcutaneous   Inject 20 Units into the skin at bedtime.          . insulin lispro (HUMALOG) 100 UNIT/ML injection      0-9 Units, Subcutaneous, 3 times daily with meals, CBG < 70: Drink or eat something sweet and recheck blood sugar. CBG 70 - 120: 0 units CBG 121 -  150: 1 unit CBG 151 - 200: 2 units CBG 201 - 250: 3 units CBG 251 - 300: 5 units CBG 301 - 350: 7 units CBG 351 - 400: 9 units CBG > 400: call MD   10 mL   1   . omeprazole (PRILOSEC) 20 MG capsule   Oral   Take 20 mg by mouth 2 (two) times daily.         . ondansetron (ZOFRAN) 4 MG tablet   Oral   Take 4 mg by mouth every 8 (eight) hours as needed for nausea.          . polyethylene glycol (MIRALAX / GLYCOLAX) packet   Oral   Take 17 g by mouth 2 (two) times daily.         . promethazine (PHENERGAN) 25 MG tablet   Oral   Take 1 tablet (25 mg total) by mouth every 6 (six) hours as needed for nausea.   30 tablet   0   . senna (SENOKOT) 8.6 MG TABS tablet   Oral   Take 2 tablets  (17.2 mg total) by mouth daily.   60 each   0   . traZODone (DESYREL) 150 MG tablet   Oral   Take 150 mg by mouth at bedtime.         . bisacodyl (DULCOLAX) 5 MG EC tablet   Oral   Take 2 tablets (10 mg total) by mouth daily as needed for moderate constipation.   30 tablet   0   . dronabinol (MARINOL) 5 MG capsule   Oral   Take 5 mg by mouth 4 (four) times daily as needed (for appetite).          Marland Kitchen HYDROmorphone (DILAUDID) 2 MG tablet   Oral   Take 1 tablet (2 mg total) by mouth every 6 (six) hours as needed for severe pain.   20 tablet   0   . zolpidem (AMBIEN) 10 MG tablet   Oral   Take 1 tablet (10 mg total) by mouth at bedtime as needed for sleep.   30 tablet   0    BP 114/80  Pulse 73  Temp(Src) 98.2 F (36.8 C) (Oral)  Resp 18  Ht 5\' 3"  (1.6 m)  Wt 124 lb 5.4 oz (56.4 kg)  BMI 22.03 kg/m2  SpO2 97% Physical Exam  Nursing note and vitals reviewed. Constitutional:  Awake, alert, nontoxic appearance.  HENT:  Head: Atraumatic.  Eyes: Right eye exhibits no discharge. Left eye exhibits no discharge.  Neck: Neck supple.  Pulmonary/Chest: Effort normal. She exhibits no tenderness.  Abdominal: Soft. There is tenderness. There is no rebound.  Minimal diffuse tenderness with moderate epigastric tenderness.  No CVA tenderness   Musculoskeletal: She exhibits no tenderness.  Baseline ROM, no obvious new focal weakness.  Neurological:  Mental status and motor strength appears baseline for patient and situation.  Skin: No rash noted.  Psychiatric: She has a normal mood and affect.    ED Course  Procedures  COORDINATION OF CARE:  Nursing notes reviewed. Vital signs reviewed. Initial pt interview and examination performed.   9:13 PM-Discussed work up plan with pt at bedside, which includes CBC with diff panel, CMP, lipase, and UA . Pt agrees with plan.   Treatment plan initiated: Medications  0.9 %  sodium chloride infusion ( Intravenous New Bag/Given  08/13/13 1250)  sodium chloride 0.9 % injection (  Not Given 08/13/13 2030)  sodium chloride 0.9 %  bolus 2,000 mL (2,000 mLs Intravenous New Bag/Given 08/12/13 2129)  HYDROmorphone (DILAUDID) injection 1 mg (1 mg Intravenous Given 08/12/13 2130)  promethazine (PHENERGAN) injection 25 mg (25 mg Intravenous Given 08/12/13 2130)  influenza vac split quadrivalent PF (FLUARIX) injection 0.5 mL (0.5 mLs Intramuscular Given 08/14/13 1153)  iohexol (OMNIPAQUE) 300 MG/ML solution 50 mL (50 mLs Oral Contrast Given 08/13/13 1900)  iohexol (OMNIPAQUE) 300 MG/ML solution 100 mL (100 mLs Intravenous Contrast Given 08/13/13 2038)  milk and molasses enema ( Rectal Given 08/14/13 1615)   9:13 PM Nursing Notes Reviewed/ Care Coordinated Applicable Imaging Reviewed  Interpretation of Laboratory Data incorporated into ED treatment Discussed results and treatment plan with pt. Pt is requesting a hospital admission for unchanged abdominal pain.   Initial diagnostic testing ordered.    Labs Review Labs Reviewed  COMPREHENSIVE METABOLIC PANEL - Abnormal; Notable for the following:    Sodium 134 (*)    Glucose, Bld 182 (*)    BUN 5 (*)    Creatinine, Ser 0.36 (*)    Albumin 3.3 (*)    ALT 61 (*)    All other components within normal limits  URINALYSIS, ROUTINE W REFLEX MICROSCOPIC - Abnormal; Notable for the following:    Hgb urine dipstick LARGE (*)    All other components within normal limits  URINE MICROSCOPIC-ADD ON - Abnormal; Notable for the following:    Squamous Epithelial / LPF MANY (*)    Bacteria, UA FEW (*)    All other components within normal limits  COMPREHENSIVE METABOLIC PANEL - Abnormal; Notable for the following:    Glucose, Bld 148 (*)    BUN 5 (*)    Creatinine, Ser 0.45 (*)    Calcium 8.2 (*)    Albumin 3.0 (*)    ALT 52 (*)    All other components within normal limits  HEMOGLOBIN A1C - Abnormal; Notable for the following:    Hemoglobin A1C 10.0 (*)    Mean Plasma Glucose 240 (*)     All other components within normal limits  GLUCOSE, CAPILLARY - Abnormal; Notable for the following:    Glucose-Capillary 133 (*)    All other components within normal limits  GLUCOSE, CAPILLARY - Abnormal; Notable for the following:    Glucose-Capillary 133 (*)    All other components within normal limits  GLUCOSE, CAPILLARY - Abnormal; Notable for the following:    Glucose-Capillary 245 (*)    All other components within normal limits  GLUCOSE, CAPILLARY - Abnormal; Notable for the following:    Glucose-Capillary 137 (*)    All other components within normal limits  BASIC METABOLIC PANEL - Abnormal; Notable for the following:    Glucose, Bld 68 (*)    BUN 3 (*)    Creatinine, Ser 0.46 (*)    All other components within normal limits  GLUCOSE, CAPILLARY - Abnormal; Notable for the following:    Glucose-Capillary 226 (*)    All other components within normal limits  GLUCOSE, CAPILLARY - Abnormal; Notable for the following:    Glucose-Capillary 62 (*)    All other components within normal limits  GLUCOSE, CAPILLARY - Abnormal; Notable for the following:    Glucose-Capillary 142 (*)    All other components within normal limits  GLUCOSE, CAPILLARY - Abnormal; Notable for the following:    Glucose-Capillary 210 (*)    All other components within normal limits  GLUCOSE, CAPILLARY - Abnormal; Notable for the following:    Glucose-Capillary 242 (*)  All other components within normal limits  CBC WITH DIFFERENTIAL  LIPASE, BLOOD  CBC  CBC   9:13 PM Consulted with Dr. Osvaldo Shipper and he will see them in ED to determine disposition  Imaging Review No results found.  EKG Interpretation     Ventricular Rate:    PR Interval:    QRS Duration:   QT Interval:    QTC Calculation:   R Axis:     Text Interpretation:              MDM   1. Abdominal pain   2. Chronic abdominal pain   3. DM type 2 (diabetes mellitus, type 2)   4. Bipolar disorder, unspecified   5.  Constipation   6. Urinary retention    Dispo pending.  I personally performed the services described in this documentation, which was scribed in my presence. The recorded information has been reviewed and is accurate.    Hurman Horn, MD 08/18/13 2114

## 2013-08-12 NOTE — ED Notes (Signed)
Pt reports her "pancreatitis coming back", pain and nausea started "a few hours ago".

## 2013-08-13 ENCOUNTER — Inpatient Hospital Stay (HOSPITAL_COMMUNITY): Payer: Medicare Other

## 2013-08-13 ENCOUNTER — Observation Stay (HOSPITAL_COMMUNITY): Payer: Medicare Other

## 2013-08-13 ENCOUNTER — Encounter (HOSPITAL_COMMUNITY): Payer: Self-pay | Admitting: General Practice

## 2013-08-13 DIAGNOSIS — E119 Type 2 diabetes mellitus without complications: Secondary | ICD-10-CM | POA: Diagnosis present

## 2013-08-13 DIAGNOSIS — K5900 Constipation, unspecified: Principal | ICD-10-CM

## 2013-08-13 DIAGNOSIS — R109 Unspecified abdominal pain: Secondary | ICD-10-CM | POA: Diagnosis not present

## 2013-08-13 DIAGNOSIS — F319 Bipolar disorder, unspecified: Secondary | ICD-10-CM

## 2013-08-13 DIAGNOSIS — R339 Retention of urine, unspecified: Secondary | ICD-10-CM | POA: Diagnosis present

## 2013-08-13 LAB — GLUCOSE, CAPILLARY
Glucose-Capillary: 133 mg/dL — ABNORMAL HIGH (ref 70–99)
Glucose-Capillary: 133 mg/dL — ABNORMAL HIGH (ref 70–99)
Glucose-Capillary: 245 mg/dL — ABNORMAL HIGH (ref 70–99)
Glucose-Capillary: 137 mg/dL — ABNORMAL HIGH (ref 70–99)
Glucose-Capillary: 226 mg/dL — ABNORMAL HIGH (ref 70–99)

## 2013-08-13 LAB — COMPREHENSIVE METABOLIC PANEL
ALT: 52 U/L — ABNORMAL HIGH (ref 0–35)
AST: 32 U/L (ref 0–37)
Albumin: 3 g/dL — ABNORMAL LOW (ref 3.5–5.2)
Alkaline Phosphatase: 97 U/L (ref 39–117)
Chloride: 106 mEq/L (ref 96–112)
GFR calc Af Amer: >90 mL/min (ref >90)
Glucose, Bld: 148 mg/dL — ABNORMAL HIGH (ref 70–99)
Potassium: 4 mEq/L (ref 3.5–5.1)
Sodium: 139 mEq/L (ref 135–145)
Total Bilirubin: 0.5 mg/dL (ref 0.3–1.2)
Total Protein: 6.1 g/dL (ref 6.0–8.3)

## 2013-08-13 LAB — CBC
Hemoglobin: 12.8 g/dL (ref 12.0–15.0)
MCHC: 32.9 g/dL (ref 30.0–36.0)
Platelets: 175 10*3/uL (ref 150–400)

## 2013-08-13 MED ORDER — PANTOPRAZOLE SODIUM 40 MG PO TBEC
40.0000 mg | DELAYED_RELEASE_TABLET | Freq: Two times a day (BID) | ORAL | Status: DC
  Administered 2013-08-13 – 2013-08-14 (×4): 40 mg via ORAL
  Filled 2013-08-13 (×4): qty 1

## 2013-08-13 MED ORDER — ONDANSETRON HCL 4 MG/2ML IJ SOLN
4.0000 mg | Freq: Four times a day (QID) | INTRAMUSCULAR | Status: DC | PRN
  Administered 2013-08-13 – 2013-08-14 (×7): 4 mg via INTRAVENOUS
  Filled 2013-08-13 (×8): qty 2

## 2013-08-13 MED ORDER — OXYCODONE HCL 5 MG PO TABS
5.0000 mg | ORAL_TABLET | ORAL | Status: DC | PRN
  Administered 2013-08-13 – 2013-08-14 (×8): 5 mg via ORAL
  Filled 2013-08-13 (×8): qty 1

## 2013-08-13 MED ORDER — HYDROMORPHONE HCL PF 1 MG/ML IJ SOLN
1.0000 mg | INTRAMUSCULAR | Status: DC | PRN
  Administered 2013-08-13 – 2013-08-14 (×9): 1 mg via INTRAVENOUS
  Filled 2013-08-13 (×9): qty 1

## 2013-08-13 MED ORDER — SODIUM CHLORIDE 0.9 % IJ SOLN
INTRAMUSCULAR | Status: AC
  Filled 2013-08-13: qty 300

## 2013-08-13 MED ORDER — DULOXETINE HCL 60 MG PO CPEP
60.0000 mg | ORAL_CAPSULE | Freq: Two times a day (BID) | ORAL | Status: DC
  Administered 2013-08-13 – 2013-08-14 (×4): 60 mg via ORAL
  Filled 2013-08-13 (×4): qty 1

## 2013-08-13 MED ORDER — IOHEXOL 300 MG/ML  SOLN
50.0000 mL | Freq: Once | INTRAMUSCULAR | Status: AC | PRN
  Administered 2013-08-13: 50 mL via ORAL

## 2013-08-13 MED ORDER — PANCRELIPASE (LIP-PROT-AMYL) 12000-38000 UNITS PO CPEP
2.0000 | ORAL_CAPSULE | Freq: Three times a day (TID) | ORAL | Status: DC
  Administered 2013-08-13 – 2013-08-14 (×6): 2 via ORAL
  Filled 2013-08-13 (×6): qty 2

## 2013-08-13 MED ORDER — IOHEXOL 300 MG/ML  SOLN
100.0000 mL | Freq: Once | INTRAMUSCULAR | Status: AC | PRN
  Administered 2013-08-13: 100 mL via INTRAVENOUS

## 2013-08-13 MED ORDER — INSULIN GLARGINE 100 UNIT/ML ~~LOC~~ SOLN
15.0000 [IU] | Freq: Every day | SUBCUTANEOUS | Status: DC
  Administered 2013-08-13: 15 [IU] via SUBCUTANEOUS
  Filled 2013-08-13 (×4): qty 0.15

## 2013-08-13 MED ORDER — ONDANSETRON HCL 4 MG PO TABS: 4.0000 mg | ORAL_TABLET | Freq: Four times a day (QID) | ORAL | Status: DC | PRN

## 2013-08-13 MED ORDER — ACETAMINOPHEN 325 MG PO TABS: 650.0000 mg | ORAL_TABLET | Freq: Four times a day (QID) | ORAL | Status: DC | PRN

## 2013-08-13 MED ORDER — SENNA 8.6 MG PO TABS
2.0000 | ORAL_TABLET | Freq: Every day | ORAL | Status: DC
  Administered 2013-08-13 – 2013-08-14 (×2): 17.2 mg via ORAL
  Filled 2013-08-13 (×2): qty 2

## 2013-08-13 MED ORDER — INFLUENZA VAC SPLIT QUAD 0.5 ML IM SUSP
0.5000 mL | INTRAMUSCULAR | Status: AC
  Administered 2013-08-14: 0.5 mL via INTRAMUSCULAR
  Filled 2013-08-13: qty 0.5

## 2013-08-13 MED ORDER — DRONABINOL 5 MG PO CAPS: 5.0000 mg | ORAL_CAPSULE | Freq: Four times a day (QID) | ORAL | Status: DC | PRN

## 2013-08-13 MED ORDER — ACETAMINOPHEN 650 MG RE SUPP: 650.0000 mg | Freq: Four times a day (QID) | RECTAL | Status: DC | PRN

## 2013-08-13 MED ORDER — POLYETHYLENE GLYCOL 3350 17 G PO PACK
17.0000 g | PACK | Freq: Two times a day (BID) | ORAL | Status: DC
  Administered 2013-08-13 – 2013-08-14 (×4): 17 g via ORAL
  Filled 2013-08-13 (×4): qty 1

## 2013-08-13 MED ORDER — INSULIN ASPART 100 UNIT/ML ~~LOC~~ SOLN
0.0000 [IU] | Freq: Three times a day (TID) | SUBCUTANEOUS | Status: DC
  Administered 2013-08-13: 1 [IU] via SUBCUTANEOUS
  Administered 2013-08-13: 3 [IU] via SUBCUTANEOUS
  Administered 2013-08-13: 1 [IU] via SUBCUTANEOUS
  Administered 2013-08-14 (×2): 3 [IU] via SUBCUTANEOUS

## 2013-08-13 MED ORDER — INSULIN ASPART 100 UNIT/ML ~~LOC~~ SOLN
0.0000 [IU] | Freq: Every day | SUBCUTANEOUS | Status: DC
Start: 2013-08-13 — End: 2013-08-14
  Administered 2013-08-13: 2 [IU] via SUBCUTANEOUS

## 2013-08-13 MED ORDER — SODIUM CHLORIDE 0.9 % IV SOLN
INTRAVENOUS | Status: AC
  Administered 2013-08-13 (×2): via INTRAVENOUS

## 2013-08-13 NOTE — Progress Notes (Signed)
Patient admitted to the hospital earlier this morning by Dr. Rito Ehrlich.  Patient seen and examined  She's been admitted with abdominal pain, vomiting. She is requiring frequent doses of IV pain medications. Abdominal x-ray indicates some stool burden. She was noted to have significant urinary retention with 1300 cc of urine. We will check a CT of the abdomen and pelvis to rule out any other underlying pathology. Liver function tests LFT are unremarkable. We'll recheck bladder scan in 6-8 hours if the patient does not void. If she continues to retain urine then she may need a Foley catheter.  MEMON,JEHANZEB

## 2013-08-13 NOTE — Progress Notes (Signed)
Utilization review completed.  

## 2013-08-13 NOTE — H&P (Signed)
[facility] History and Physical  [name] QUIZHPI WUJ:[id number] DOB: August 21, 1971 DOA: 08/12/2013   PCP: [name], MD  Specialists: She is followed by her providers at [facility] Complaint: Abdominal pain since earlier yesterday  HPI: [name] [name] is a 42 y.o. female with a past medical history of chronic pancreatitis, diabetes, bipolar disorder, who was in her usual state of health earlier on Wednesday morning when she started having upper abdominal pain. She usually has chronic abdominal pain, which she rates at 6-7/10 in intensity. The pain was much more severe than her usual baseline and was 10/10. She tried taking ibuprofen without any relief. She has been taken off of narcotics for the last 4 months. The pain is aggravated by eating. Relieved by empty stomach. Since the pain was not improving and since she started developing nausea and had 4 episodes of vomiting she decided to come in to the hospital. Denies any blood in the emesis. She had normal bowel movement today. Denies any fever or chills. Denies any weight loss. The pain does radiate to the back. She was last admitted at [facility] in September and had a CT scan at that time, which revealed heavy stool burden. She was put on a bowel regimen with good relief and then subsequently, she was discharged. Treatment in the emergency department hasn't made much improvement in her symptoms and so she merits observation in the hospital.  Home Medications: Prior to Admission medications   Medication Sig Start Date End Date Taking? Authorizing Provider  CREON 24000 UNITS CPEP Take 24,000 Units by mouth 3 (three) times daily.  07/02/13  Yes Historical Provider, MD  DULoxetine (CYMBALTA) 60 MG capsule Take 60 mg by mouth 2 (two) times daily.    Yes Historical Provider, MD  insulin glargine (LANTUS) 100 UNIT/ML injection Inject 20 Units into the skin at bedtime.  05/21/13 05/21/14 Yes Elease Etienne, MD  insulin lispro  (HUMALOG) 100 UNIT/ML injection 0-9 Units, Subcutaneous, 3 times daily with meals, CBG < 70: Drink or eat something sweet and recheck blood sugar. CBG 70 - 120: 0 units CBG 121 - 150: 1 unit CBG 151 - 200: 2 units CBG 201 - 250: 3 units CBG 251 - 300: 5 units CBG 301 - 350: 7 units CBG 351 - 400: 9 units CBG > 400: call MD 06/05/13  Yes Merlyn Albert, MD  omeprazole (PRILOSEC) 20 MG capsule Take 20 mg by mouth 2 (two) times daily.   Yes Historical Provider, MD  ondansetron (ZOFRAN) 4 MG tablet Take 4 mg by mouth every 8 (eight) hours as needed for nausea.  02/02/13  Yes Historical Provider, MD  polyethylene glycol (MIRALAX / GLYCOLAX) packet Take 17 g by mouth 2 (two) times daily.   Yes Historical Provider, MD  promethazine (PHENERGAN) 25 MG tablet Take 1 tablet (25 mg total) by mouth every 6 (six) hours as needed for nausea. 06/17/13  Yes Gilda Crease, MD  senna (SENOKOT) 8.6 MG TABS tablet Take 2 tablets (17.2 mg total) by mouth daily. 05/21/13  Yes Elease Etienne, MD  traZODone (DESYREL) 150 MG tablet Take 150 mg by mouth at bedtime.   Yes Historical Provider, MD  zolpidem (AMBIEN) 10 MG tablet Take 10 mg by mouth 2 (two) times daily.   Yes Historical Provider, MD  dronabinol (MARINOL) 5 MG capsule Take 5 mg by mouth 4 (four) times daily as needed (for appetite).     Historical Provider, MD  Allergies:  Allergies  Allergen Reactions  . Buspar [Buspirone] Other (See Comments)    Dizziness  . Metoclopramide Hcl     REGLAN REACTION: "lock-jaw"    Past Medical History: Past Medical History  Diagnosis Date  . Pancreatitis   . Diabetes mellitus   . Esophageal ulcer 1997  . Depression   . Anxiety   . Migraine headache   . Bipolar disorder     bipolar was a misdiagnoses   . Glucose intolerance (impaired glucose tolerance)   . Chronic pancreatitis   . Kidney stones     Past Surgical History  Procedure Laterality Date  . Whipple procedure    . Intestinal blockage     . Abdominal surgery    . Abdominal hysterectomy    . Cholecystectomy    . Cesarean section    . Colonoscopy      Social History: She lives with her daughters. Denies smoking, alcohol or illicit drug use. She is independent with daily activities.  Family History:  there is history of, diabetes, and cancer in the family  Review of Systems - History obtained from the patient General ROS: positive for  - fatigue Psychological ROS: negative Ophthalmic ROS: negative ENT ROS: negative Allergy and Immunology ROS: negative Hematological and Lymphatic ROS: negative Endocrine ROS: negative Respiratory ROS: no cough, shortness of breath, or wheezing Cardiovascular ROS: no chest pain or dyspnea on exertion Gastrointestinal ROS: as in hpi Genito-Urinary ROS: no dysuria, trouble voiding, or hematuria Musculoskeletal ROS: negative Neurological ROS: no TIA or stroke symptoms Dermatological ROS: negative  Physical Examination  Filed Vitals:   08/12/13 1746 08/12/13 2222 08/12/13 2304  BP: 140/77 111/66 111/73  Pulse: 95 88 91  Temp: 98.7 F (37.1 C)    TempSrc: Oral    Resp: 18 16 17   Height: 5\' 3"  (1.6 m)    Weight: 54.885 kg (121 lb)    SpO2: 98% 94% 94%    General appearance: alert, cooperative, appears stated age and no distress Head: Normocephalic, without obvious abnormality, atraumatic Eyes: conjunctivae/corneas clear. PERRL, EOM's intact.  Throat: lips, mucosa, and tongue normal; teeth and gums normal Neck: no adenopathy, no carotid bruit, no JVD, supple, symmetrical, trachea midline and thyroid not enlarged, symmetric, no tenderness/mass/nodules Resp: clear to auscultation bilaterally Cardio: regular rate and rhythm, S1, S2 normal, no murmur, click, rub or gallop GI: Abdomen is soft. Tenderness is present in the epigastric area without any rebound, rigidity, or guarding. Bowel sounds are present. No masses, or organomegaly. Scars from previous surgery is  noted. Extremities: extremities normal, atraumatic, no cyanosis or edema Pulses: 2+ and symmetric Skin: Skin color, texture, turgor normal. No rashes or lesions Lymph nodes: Cervical, supraclavicular, and axillary nodes normal. Neurologic: She is alert and oriented x3. No focal neurological deficits are present.  Laboratory Data: Results for orders placed during the hospital encounter of 08/12/13 (from the past 48 hour(s))  CBC WITH DIFFERENTIAL     Status: None   Collection Time    08/12/13  8:38 PM      Result Value Range   WBC 7.2  4.0 - 10.5 K/uL   RBC 4.16  3.87 - 5.11 MIL/uL   Hemoglobin 13.4  12.0 - 15.0 g/dL   HCT 11.9  14.7 - 82.9 %   MCV 95.4  78.0 - 100.0 fL   MCH 32.2  26.0 - 34.0 pg   MCHC 33.8  30.0 - 36.0 g/dL   RDW 56.2  13.0 - 86.5 %  Platelets 171  150 - 400 K/uL   Neutrophils Relative % 63  43 - 77 %   Neutro Abs 4.5  1.7 - 7.7 K/uL   Lymphocytes Relative 32  12 - 46 %   Lymphs Abs 2.3  0.7 - 4.0 K/uL   Monocytes Relative 5  3 - 12 %   Monocytes Absolute 0.4  0.1 - 1.0 K/uL   Eosinophils Relative 0  0 - 5 %   Eosinophils Absolute 0.0  0.0 - 0.7 K/uL   Basophils Relative 0  0 - 1 %   Basophils Absolute 0.0  0.0 - 0.1 K/uL  COMPREHENSIVE METABOLIC PANEL     Status: Abnormal   Collection Time    08/12/13  8:38 PM      Result Value Range   Sodium 134 (*) 135 - 145 mEq/L   Potassium 3.9  3.5 - 5.1 mEq/L   Chloride 99  96 - 112 mEq/L   CO2 25  19 - 32 mEq/L   Glucose, Bld 182 (*) 70 - 99 mg/dL   BUN 5 (*) 6 - 23 mg/dL   Creatinine, Ser 7.84 (*) 0.50 - 1.10 mg/dL   Calcium 8.8  8.4 - 69.6 mg/dL   Total Protein 6.6  6.0 - 8.3 g/dL   Albumin 3.3 (*) 3.5 - 5.2 g/dL   AST 35  0 - 37 U/L   ALT 61 (*) 0 - 35 U/L   Alkaline Phosphatase 103  39 - 117 U/L   Total Bilirubin 0.4  0.3 - 1.2 mg/dL   GFR calc non Af Amer >90  >90 mL/min   GFR calc Af Amer >90  >90 mL/min   Comment: (NOTE)     The eGFR has been calculated using the CKD EPI equation.     This  calculation has not been validated in all clinical situations.     eGFR's persistently <90 mL/min signify possible Chronic Kidney     Disease.  LIPASE, BLOOD     Status: None   Collection Time    08/12/13  8:38 PM      Result Value Range   Lipase 14  11 - 59 U/L  URINALYSIS, ROUTINE W REFLEX MICROSCOPIC     Status: Abnormal   Collection Time    08/12/13 10:50 PM      Result Value Range   Color, Urine YELLOW  YELLOW   APPearance CLEAR  CLEAR   Specific Gravity, Urine 1.010  1.005 - 1.030   pH 6.0  5.0 - 8.0   Glucose, UA NEGATIVE  NEGATIVE mg/dL   Hgb urine dipstick LARGE (*) NEGATIVE   Bilirubin Urine NEGATIVE  NEGATIVE   Ketones, ur NEGATIVE  NEGATIVE mg/dL   Protein, ur NEGATIVE  NEGATIVE mg/dL   Urobilinogen, UA 0.2  0.0 - 1.0 mg/dL   Nitrite NEGATIVE  NEGATIVE   Leukocytes, UA NEGATIVE  NEGATIVE  URINE MICROSCOPIC-ADD ON     Status: Abnormal   Collection Time    08/12/13 10:50 PM      Result Value Range   Squamous Epithelial / LPF MANY (*) RARE   RBC / HPF TOO NUMEROUS TO COUNT  <3 RBC/hpf   Bacteria, UA FEW (*) RARE    Radiology Reports: No results found.  Electrocardiogram: None  Problem List  Principal Problem:   Abdominal pain Active Problems:   PANCREATITIS, HX OF   GASTROESOPHAGEAL REFLUX DISEASE, HX OF   Constipation   DM type 2 (diabetes mellitus,  type 2)   Assessment: This is a 42 year old, Caucasian female, who presents with upper abdominal pain with nausea and vomiting. She may be having an acute exacerbation of her chronic pancreatitis. Her lipase level was normal. Her LFTs did not show any significant abnormalities. Other differential include gastritis, constipation.  Plan: #1 abdominal pain, most likely due to acute flare of chronic pancreatitis. She is status post Whipple's procedure in 2011 with a revision procedure in 2013. Her abdomen is tender however, does not appear to be acute. We will get abdominal x-ray. If the abdominal x-ray is  significantly abnormal, CT imaging may have to be considered, but at this time we can hold off. We will give her IV fluids, and pain medications. Continue with laxatives. PPI will be provided. She'll be kept on clear liquids and diet can be advanced slowly. Continue with her enzyme replacement therapy. Abnormal UA is noted with large hemoglobin, and many RBCs. Patient does have a history of kidney stones, but denies any symptoms suggestive of renal colic. Continue to monitor for now.  #2 diabetes mellitus, type II: Continue with Lantus at a slightly lower dose. Sliding scale coverage will be provided. Check HbA1c.  #3 history of bipolar disorder: Continue with her psychotropic agents.  DVT Prophylaxis: SCDs for now. She had subarachnoid hemorrhage in April Code Status: Full code Family Communication: Discussed with the patient  Disposition Plan: Observe   Further management decisions will depend on results of further testing and patient's response to treatment.  [facility]  [facility] Pager [phone]  If 7PM-7AM, please contact night-coverage [email or web address] Password [email or web address]  08/13/2013, 12:58 AM

## 2013-08-14 DIAGNOSIS — R339 Retention of urine, unspecified: Secondary | ICD-10-CM | POA: Diagnosis not present

## 2013-08-14 DIAGNOSIS — K5900 Constipation, unspecified: Secondary | ICD-10-CM | POA: Diagnosis not present

## 2013-08-14 DIAGNOSIS — R109 Unspecified abdominal pain: Secondary | ICD-10-CM | POA: Diagnosis not present

## 2013-08-14 DIAGNOSIS — E119 Type 2 diabetes mellitus without complications: Secondary | ICD-10-CM | POA: Diagnosis not present

## 2013-08-14 LAB — BASIC METABOLIC PANEL
BUN: 3 mg/dL — ABNORMAL LOW (ref 6–23)
Calcium: 8.5 mg/dL (ref 8.4–10.5)
Chloride: 104 mEq/L (ref 96–112)
Creatinine, Ser: 0.46 mg/dL — ABNORMAL LOW (ref 0.50–1.10)
GFR calc Af Amer: >90 mL/min (ref >90)
Glucose, Bld: 68 mg/dL — ABNORMAL LOW (ref 70–99)

## 2013-08-14 LAB — CBC
HCT: 39.6 % (ref 36.0–46.0)
MCH: 32.5 pg (ref 26.0–34.0)
MCV: 97.5 fL (ref 78.0–100.0)
Platelets: 181 10*3/uL (ref 150–400)
RDW: 13.3 % (ref 11.5–15.5)

## 2013-08-14 LAB — GLUCOSE, CAPILLARY
Glucose-Capillary: 62 mg/dL — ABNORMAL LOW (ref 70–99)
Glucose-Capillary: 142 mg/dL — ABNORMAL HIGH (ref 70–99)

## 2013-08-14 MED ORDER — BISACODYL 5 MG PO TBEC
10.0000 mg | DELAYED_RELEASE_TABLET | Freq: Two times a day (BID) | ORAL | Status: DC
  Administered 2013-08-14: 10 mg via ORAL
  Filled 2013-08-14: qty 2

## 2013-08-14 MED ORDER — MILK AND MOLASSES ENEMA
Freq: Once | RECTAL | Status: AC
  Administered 2013-08-14: 16:00:00 via RECTAL

## 2013-08-14 MED ORDER — HYDROMORPHONE HCL 2 MG PO TABS: 2.0000 mg | ORAL_TABLET | Freq: Four times a day (QID) | ORAL | Status: DC | PRN

## 2013-08-14 MED ORDER — ZOLPIDEM TARTRATE 10 MG PO TABS: 10.0000 mg | ORAL_TABLET | Freq: Every evening | ORAL | Status: DC | PRN

## 2013-08-14 MED ORDER — BISACODYL 5 MG PO TBEC: 10.0000 mg | DELAYED_RELEASE_TABLET | Freq: Every day | ORAL | Status: DC | PRN

## 2013-08-14 NOTE — Progress Notes (Signed)
Inpatient Diabetes Program Recommendations  AACE/ADA: New Consensus Statement on Inpatient Glycemic Control  Target Ranges:  Prepandial:   less than 140 mg/dL      Peak postprandial:   less than 180 mg/dL (1-2 hours)      Critically ill patients:  140 - 180 mg/dL  Pager:  [phone] Hours:  8 am-10pm   Reason for Visit: Hypoglycemia: 62 mg/dL  Inpatient Diabetes Program Recommendations Insulin - Basal: Decrease Lantus to 12 units qhs  Alfredia Client PhD, RN, BC-ADM Diabetes Coordinator  Office:  [phone] Team Pager:  [phone]

## 2013-08-14 NOTE — Progress Notes (Signed)
Patient with orders to be discharge home. Discharge instructions given, verbalized understanding. Prescription given. Patient stable. Patient left with family in private vehicle.

## 2013-08-14 NOTE — Progress Notes (Signed)
Hypoglycemic Event CBG: 62  Treatment: 15 GM carbohydrate snack  Symptoms: None  Follow-up CBG: Time: 0921   CBG Result: 142  Possible Reasons for Event: Unknown  Comments/MD notified: Dr. Kerry Hough, notified. Rechecked CBG after breakfast    [name] [name]  Remember to initiate Hypoglycemia Order Set & complete

## 2013-08-14 NOTE — Discharge Summary (Signed)
Physician Discharge Summary  [name]:[id number] DOB: Dec 09, 1970 DOA: 08/12/2013  PCP: [name], MD  Admit date: 08/12/2013 Discharge date: 08/14/2013  Time spent: 35 minutes  Recommendations for Outpatient Follow-up:  1. Follow up with primary care doctor in 2 weeks  Discharge Diagnoses:  Principal Problem:   Abdominal pain Active Problems:   PANCREATITIS, HX OF   GASTROESOPHAGEAL REFLUX DISEASE, HX OF   Constipation   DM type 2 (diabetes mellitus, type 2)   Urinary retention   Discharge Condition: improved  Diet recommendation: low carb  Filed Weights   08/12/13 1746 08/13/13 0141  Weight: 54.885 kg (121 lb) 56.4 kg (124 lb 5.4 oz)    History of present illness:  [name] [name] is a 42 y.o. female with a past medical history of chronic pancreatitis, diabetes, bipolar disorder, who was in her usual state of health earlier on Wednesday morning when she started having upper abdominal pain. She usually has chronic abdominal pain, which she rates at 6-7/10 in intensity. The pain was much more severe than her usual baseline and was 10/10. She tried taking ibuprofen without any relief. She has been taken off of narcotics for the last 4 months. The pain is aggravated by eating. Relieved by empty stomach. Since the pain was not improving and since she started developing nausea and had 4 episodes of vomiting she decided to come in to the hospital. Denies any blood in the emesis. She had normal bowel movement today. Denies any fever or chills. Denies any weight loss. The pain does radiate to the back. She was last admitted at [facility] in September and had a CT scan at that time, which revealed heavy stool burden. She was put on a bowel regimen with good relief and then subsequently, she was discharged. Treatment in the emergency department hasn't made much improvement in her symptoms and so she merits observation in the hospital.   Hospital Course:  This patient was  admitted to the hospital with abdominal pain. 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 On admission, lipase and LFTs were found to be normal. Abdominal x-rays indicated constipation but no other acute findings. CT scan of the abdomen and pelvis was done which also confirmed constipation but no other acute findings. The patient was given enemas as well as laxatives and had several bowel movements. Her vomiting resolved and she was able to tolerate solid food. She continued to have some abdominal pain but felt well enough that she could discharge home. She remained afebrile and had normal vital signs. Since there were no acute findings on blood work or CT imaging, as well as a benign abdominal exam, patient was felt stable for discharge home and followup with her primary care physician next week as scheduled.  Procedures:  none  Consultations:  none  Discharge Exam: Filed Vitals:   08/14/13 1350  BP: 114/80  Pulse: 73  Temp: 98.2 F (36.8 C)  Resp: 18    General: NAD Cardiovascular: S1, S2 RRR Respiratory: CTA B  Discharge Instructions  Discharge Orders   Future Orders Complete By Expires   Diet - low sodium heart healthy  As directed    Increase activity slowly  As directed        Medication List         bisacodyl 5 MG EC tablet  Commonly known as:  DULCOLAX  Take 2 tablets (10 mg total) by mouth daily  as needed for moderate constipation.     CREON 24000 UNITS Cpep  Generic drug:  Pancrelipase (Lip-Prot-Amyl)  Take 24,000 Units by mouth 3 (three) times daily.     dronabinol 5 MG capsule  Commonly known as:  MARINOL  Take 5 mg by mouth 4 (four) times daily as needed (for appetite).     DULoxetine 60 MG capsule  Commonly known as:  CYMBALTA  Take 60 mg by mouth 2 (two) times daily.     HYDROmorphone 2 MG tablet  Commonly known as:  DILAUDID  Take 1 tablet (2 mg total) by mouth every 6 (six) hours as needed for  severe pain.     insulin glargine 100 UNIT/ML injection  Commonly known as:  LANTUS  Inject 20 Units into the skin at bedtime.     insulin lispro 100 UNIT/ML injection  Commonly known as:  HUMALOG  - 0-9 Units, Subcutaneous, 3 times daily with meals,  - CBG < 70: Drink or eat something sweet and recheck blood sugar.  - CBG 70 - 120: 0 units  - CBG 121 - 150: 1 unit  - CBG 151 - 200: 2 units  - CBG 201 - 250: 3 units  - CBG 251 - 300: 5 units  - CBG 301 - 350: 7 units  - CBG 351 - 400: 9 units  - CBG > 400: call MD     omeprazole 20 MG capsule  Commonly known as:  PRILOSEC  Take 20 mg by mouth 2 (two) times daily.     ondansetron 4 MG tablet  Commonly known as:  ZOFRAN  Take 4 mg by mouth every 8 (eight) hours as needed for nausea.     polyethylene glycol packet  Commonly known as:  MIRALAX / GLYCOLAX  Take 17 g by mouth 2 (two) times daily.     promethazine 25 MG tablet  Commonly known as:  PHENERGAN  Take 1 tablet (25 mg total) by mouth every 6 (six) hours as needed for nausea.     senna 8.6 MG Tabs tablet  Commonly known as:  SENOKOT  Take 2 tablets (17.2 mg total) by mouth daily.     traZODone 150 MG tablet  Commonly known as:  DESYREL  Take 150 mg by mouth at bedtime.     zolpidem 10 MG tablet  Commonly known as:  AMBIEN  Take 1 tablet (10 mg total) by mouth at bedtime as needed for sleep.       Allergies  Allergen Reactions  . Buspar [Buspirone] Other (See Comments)    Dizziness  . Metoclopramide Hcl     REGLAN REACTION: "lock-jaw"      The results of significant diagnostics from this hospitalization (including imaging, microbiology, ancillary and laboratory) are listed below for reference.    Significant Diagnostic Studies: Ct Abdomen Pelvis W Contrast  08/13/2013   CLINICAL DATA:  42-year- female with history of pancreatitis, esophageal ulcer. Status post Whipple procedure. Abdominal pain with vomiting. Initial encounter.  EXAM: CT  ABDOMEN AND PELVIS WITH CONTRAST  TECHNIQUE: Multidetector CT imaging of the abdomen and pelvis was performed using the standard protocol following bolus administration of intravenous contrast.  CONTRAST:  50mL OMNIPAQUE IOHEXOL 300 MG/ML SOLN, OMNIPAQUE IOHEXOL 300 MG/ML SOLN  COMPARISON:  05/19/2013 and earlier.  FINDINGS: No pericardial effusion. Trace bilateral pleural effusions. Minimal lung base atelectasis.  No acute osseous abnormality identified.  The bladder is distended. No pelvic free fluid. Retained stool throughout the  colon which is redundant. The sigmoid extends into the right lower quadrant and an back across midline to the left. No definite colitis.  No dilated small bowel. Oral contrast has not yet reached the distal small bowel. The stomach is distended with air and contrast. Extensive postoperative changes to the upper abdomen again noted. Stable enhancement of the residual pancreatic tail. Dilated main pancreatic duct within the tail is stable.  Continued mild mesenteric stranding at the root of the small bowel mesenteries a and in the residual lesser sac. Numerous small mesenteric lymph nodes in this region appear stable. Portal venous system appears patent. There are portal venous mesenteric collaterals re- identified along the anterior and left mesentery. Major upper abdominal arterial structures appear stable and within normal limits. No free air or free fluid in the abdomen.  Hepatic steatosis and chronic pneumobilia. Gallbladder within normal limits. Spleen and adrenal glands within normal limits. Kidneys are stable and within normal limits except for evidence of punctate left midpole nephrolithiasis. The IVC is diminutive as before.  IMPRESSION: 1. Overall stable postoperative appearance of the abdomen since 05/19/2013, except for no dilated bowel today or evidence of bowel obstruction. No new or increased inflammatory process identified.  2. Trace pleural effusions.    Electronically Signed   By: Augusto Gamble M.D.   On: 08/13/2013 21:14   Dg Abd 2 Views  08/13/2013   CLINICAL DATA:  42-year- female with abdominal pain. History of pancreatitis, Whipple procedure. Initial encounter.  EXAM: ABDOMEN - 2 VIEW  COMPARISON:  06/17/2013 and earlier.  FINDINGS: Chronic gas and stool distended colon. Bowel gas pattern similar to multiple prior studies. No dilated small bowel identified. Numerous surgical clips in the upper abdomen. On the upright view, there is chronic elevation of the right hemidiaphragm, in the tip of the diaphragm is not entirely included, but no pneumoperitoneum is identified. There is chronic postoperative pneumobilia. Numerous pelvic phleboliths. No acute osseous abnormality identified.  IMPRESSION: Stable bowel gas pattern, with chronic gas and stool distended colon. Suspect chronic decreased colonic inertia. No pneumoperitoneum. Chronic postoperative pneumobilia.   Electronically Signed   By: Augusto Gamble M.D.   On: 08/13/2013 01:14    Microbiology: No results found for this or any previous visit (from the past 240 hour(s)).   Labs: Basic Metabolic Panel:  Recent Labs Lab 08/12/13 2038 08/13/13 0558 08/14/13 0542  NA 134* 139 141  K 3.9 4.0 3.8  CL 99 106 104  CO2 25 27 30   GLUCOSE 182* 148* 68*  BUN 5* 5* 3*  CREATININE 0.36* 0.45* 0.46*  CALCIUM 8.8 8.2* 8.5   Liver Function Tests:  Recent Labs Lab 08/12/13 2038 08/13/13 0558  AST 35 32  ALT 61* 52*  ALKPHOS 103 97  BILITOT 0.4 0.5  PROT 6.6 6.1  ALBUMIN 3.3* 3.0*    Recent Labs Lab 08/12/13 2038  LIPASE 14   No results found for this basename: AMMONIA,  in the last 168 hours CBC:  Recent Labs Lab 08/12/13 2038 08/13/13 0558 08/14/13 0542  WBC 7.2 5.1 4.3  NEUTROABS 4.5  --   --   HGB 13.4 12.8 13.2  HCT 39.7 38.9 39.6  MCV 95.4 97.5 97.5  PLT 171 175 181   Cardiac Enzymes: No results found for this basename: CKTOTAL, CKMB, CKMBINDEX, TROPONINI,  in the last  168 hours BNP: BNP (last 3 results) No results found for this basename: PROBNP,  in the last 8760 hours CBG:  Recent Labs Lab  08/13/13 2129 08/14/13 0751 08/14/13 0921 08/14/13 1133 08/14/13 1701  GLUCAP 226* 62* 142* 210* 242*       Signed:  Kendre Sires  [facility] 08/14/2013, 8:53 PM

## 2013-08-14 NOTE — Progress Notes (Signed)
Patient has been urinating throughout the night.  Upon assessment at 05:25, patient urinated without difficulty.  Patient was bladder scanned immediately following void and was retaining 606 ml's.  Patient stated she did not have the urge to void at this time.  Will continue to monitor.    Ellene Route 08/14/2013

## 2013-08-31 NOTE — Telephone Encounter (Signed)
Pt did see Dr. Fransico Him, missed appt due to being in hospital

## 2013-09-15 IMAGING — CR DG ABDOMEN ACUTE W/ 1V CHEST
4 series · 4 of 4 positions shown · non-contrast
Comparison: CT scan 12/25/2012.

CLINICAL DATA: Abdominal pain, nausea and vomiting.

ACUTE ABDOMEN SERIES (ABDOMEN 2 VIEW & CHEST 1 VIEW)

[view not recorded (1 of 4)]
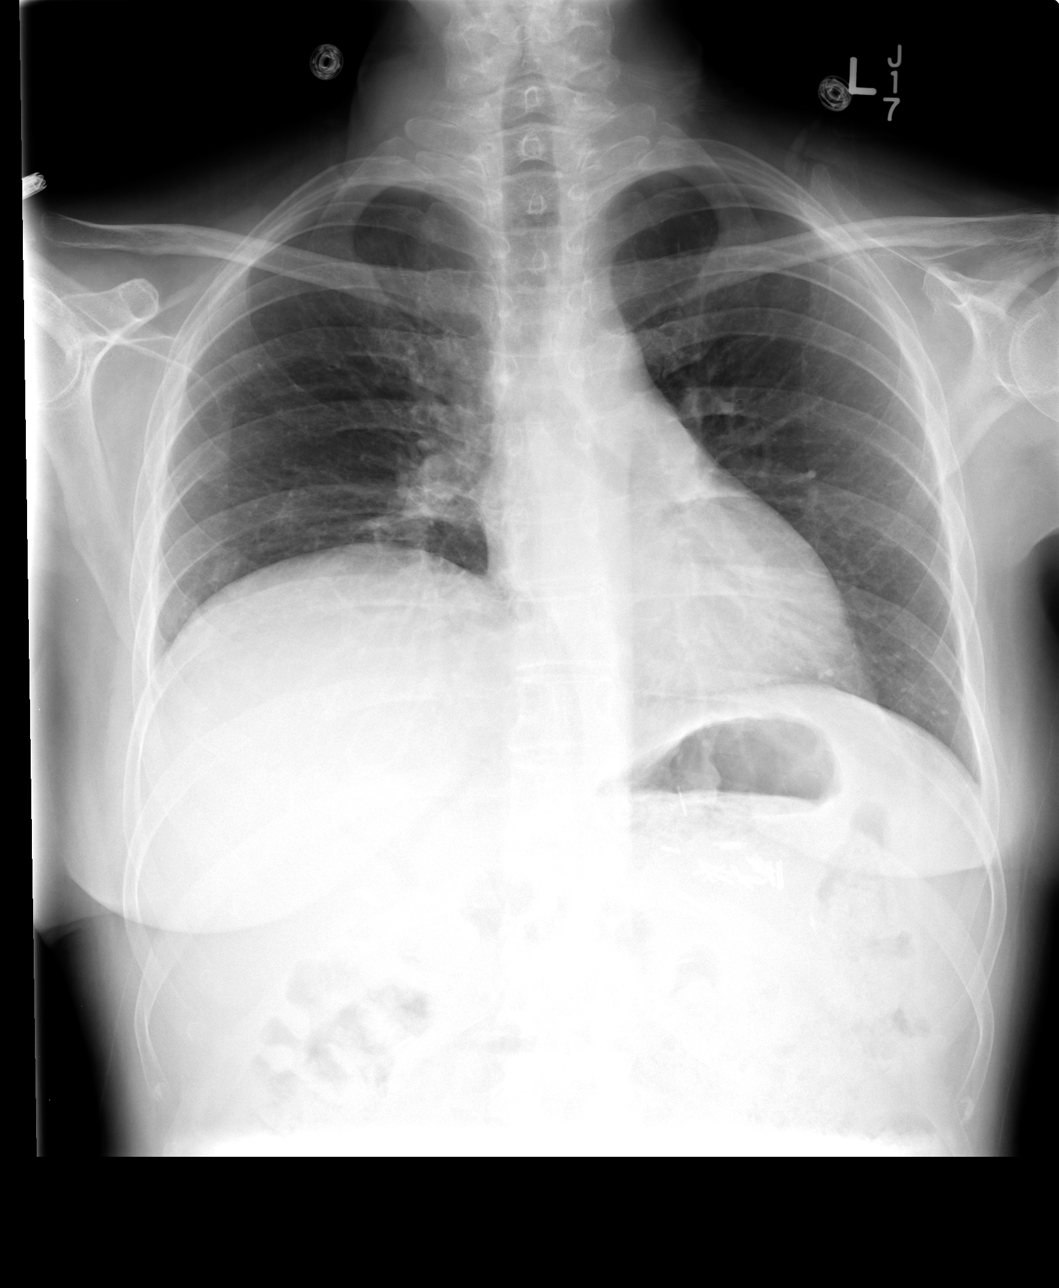

[view not recorded (2 of 4)]
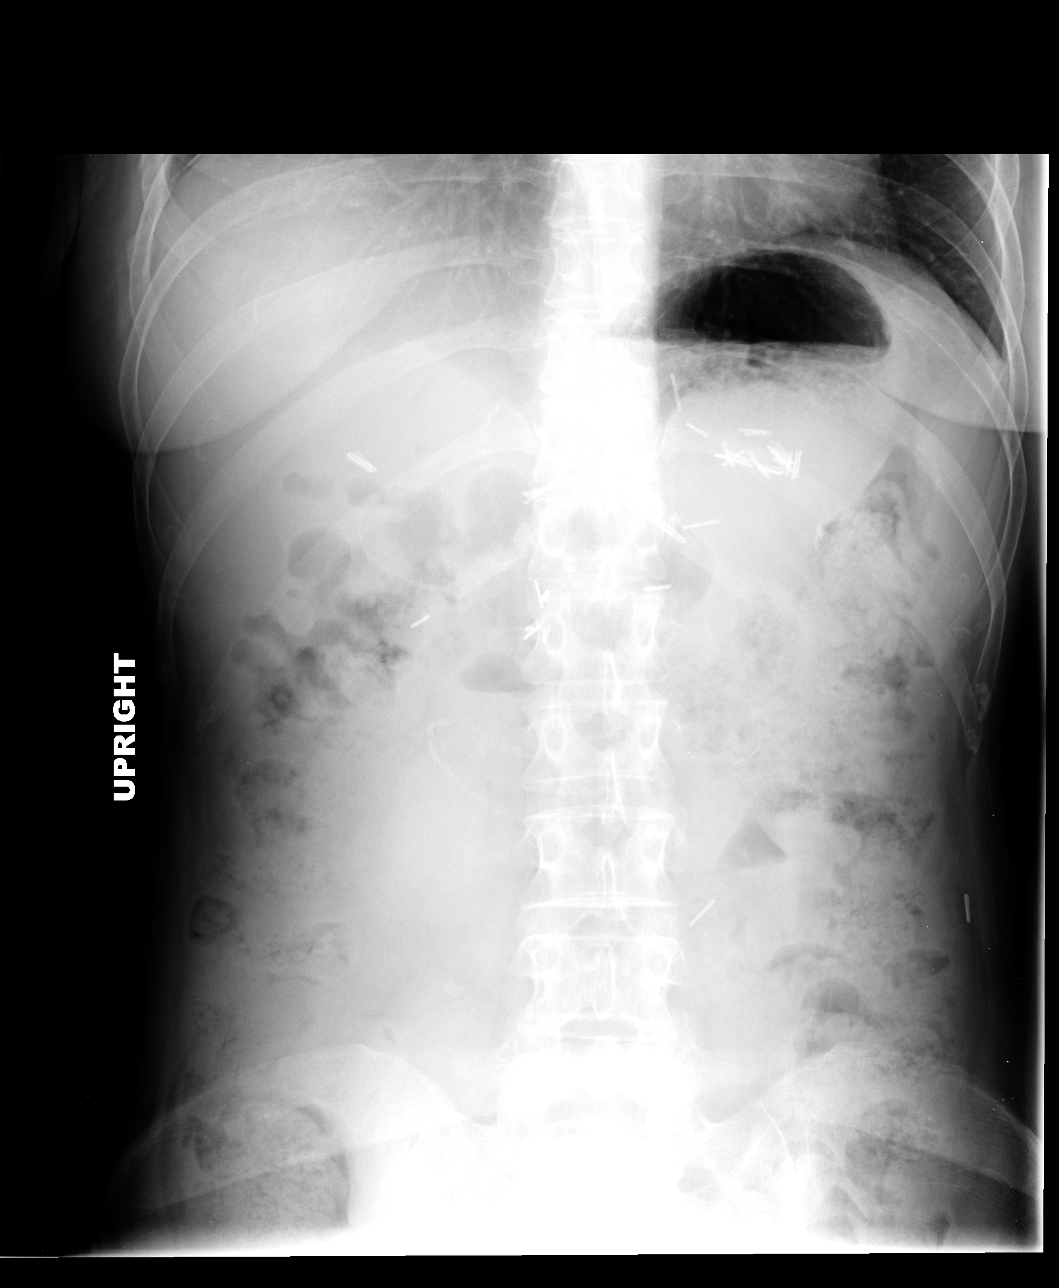

[view not recorded (3 of 4)]
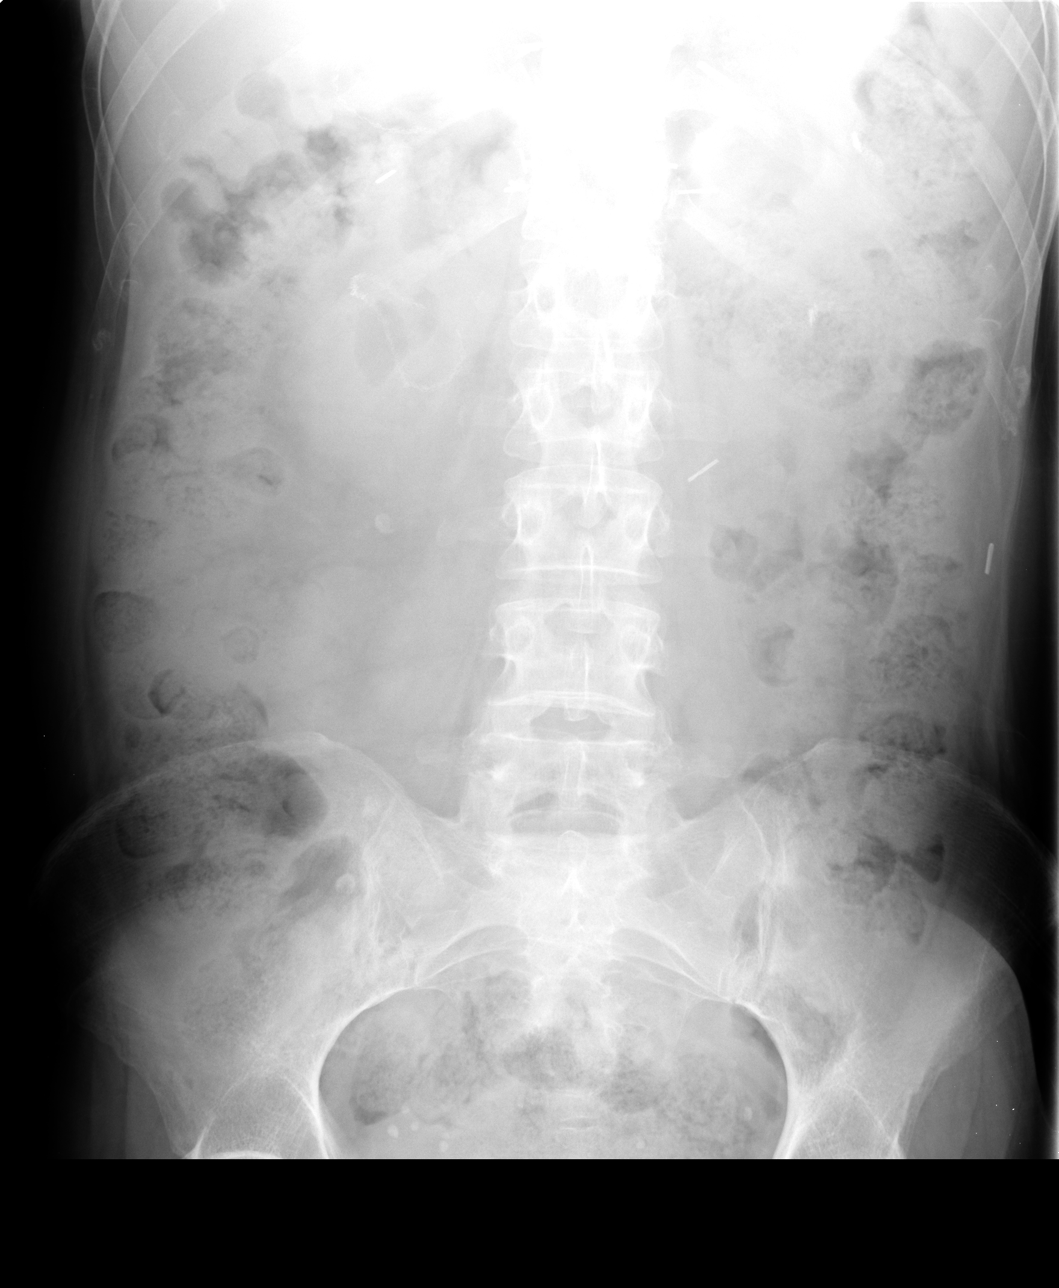

[view not recorded (4 of 4)]
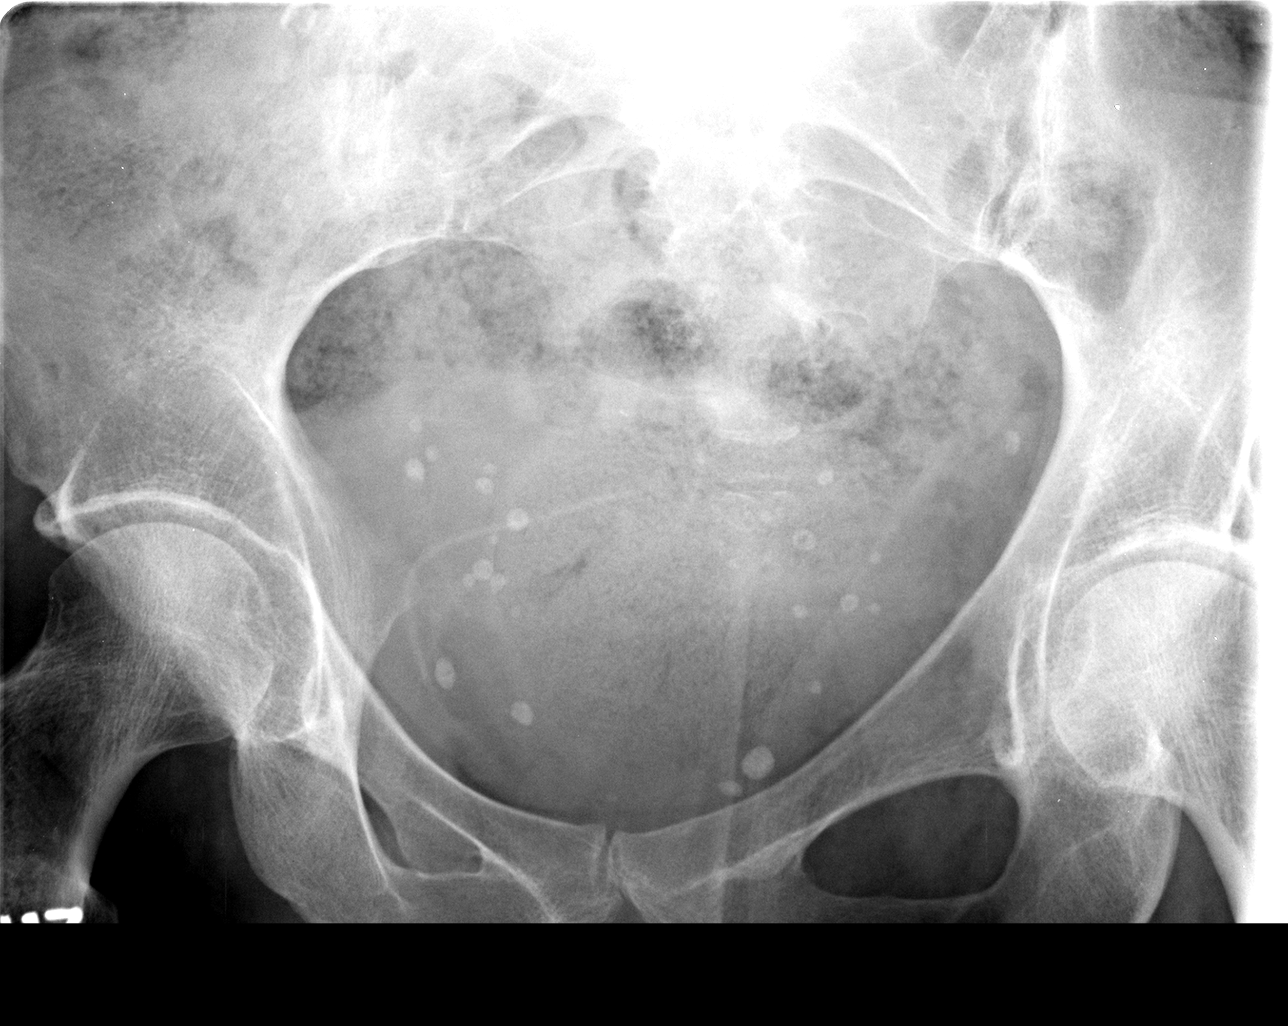

[4 of 4 positions shown; findings below may reference images not displayed]

FINDINGS: The upright chest x-ray demonstrates stable elevation of
the right hemidiaphragm with overlying vascular crowding and
atelectasis.  No pleural effusion or focal pulmonary filtrate.

Two views of the abdomen demonstrate a large amount of stool
throughout the colon suggesting constipation.  No distended small
bowel loops to suggest obstruction.  Extensive surgical changes are
noted.  Stable vascular calcifications.
IMPRESSION: 1.  Large amount of stool throughout the colon and down into the
rectum suggesting constipation.
2.  No findings for small bowel obstruction or free air.

## 2013-09-17 DIAGNOSIS — K869 Disease of pancreas, unspecified: Secondary | ICD-10-CM | POA: Diagnosis not present

## 2013-09-17 DIAGNOSIS — Z9119 Patient's noncompliance with other medical treatment and regimen: Secondary | ICD-10-CM | POA: Diagnosis not present

## 2013-09-17 DIAGNOSIS — E1065 Type 1 diabetes mellitus with hyperglycemia: Secondary | ICD-10-CM | POA: Diagnosis not present

## 2013-09-23 ENCOUNTER — Encounter: Payer: Self-pay | Admitting: Family Medicine

## 2013-09-23 ENCOUNTER — Ambulatory Visit (INDEPENDENT_AMBULATORY_CARE_PROVIDER_SITE_OTHER): Payer: Medicare Other | Admitting: Family Medicine

## 2013-09-23 VITALS — BP 128/86 | Temp 98.6°F | Ht 63.0 in | Wt 126.0 lb

## 2013-09-23 DIAGNOSIS — J329 Chronic sinusitis, unspecified: Secondary | ICD-10-CM

## 2013-09-23 DIAGNOSIS — R110 Nausea: Secondary | ICD-10-CM

## 2013-09-23 MED ORDER — PROMETHAZINE HCL 25 MG/ML IJ SOLN
25.0000 mg | Freq: Once | INTRAMUSCULAR | Status: AC
  Administered 2013-09-23: 25 mg via INTRAMUSCULAR

## 2013-09-23 MED ORDER — AZITHROMYCIN 250 MG PO TABS: ORAL_TABLET | ORAL | Status: DC

## 2013-09-23 MED ORDER — OXYCODONE-ACETAMINOPHEN 5-325 MG PO TABS: 1.0000 | ORAL_TABLET | Freq: Four times a day (QID) | ORAL | Status: DC | PRN

## 2013-09-23 MED ORDER — CHLORZOXAZONE 500 MG PO TABS: 500.0000 mg | ORAL_TABLET | Freq: Three times a day (TID) | ORAL | Status: DC

## 2013-09-23 NOTE — Progress Notes (Signed)
   Subjective:    Patient ID: [name] [name], female    DOB: 1971/01/11, 42 y.o.   MRN: [id number]  Sore Throat  This is a new problem. The current episode started in the past 7 days. Associated symptoms include abdominal pain and headaches.   Poss expsoure to bronchitis, significant frontal headache. Pressure. Throat worse in the morning. No fever  Messing around in the house and injured her left shoulder. Several days ago. Painful with motion. Upper anterior shoulder and posterior shoulder. Pain severe times. Patient taking over-the-counter meds and not helping.  No wheezing   Review of Systems  Gastrointestinal: Positive for abdominal pain.  Neurological: Positive for headaches.       Objective:   Physical Exam  Alert baseline malaise present HET moderate his congestion frontal tenderness neck supple no lymphadenopathy. Lungs clear heart rare rhythm. Shoulder positive pain with rotation. No deformity good range of motion      Assessment & Plan:  Impression 1 rhinosinusitis. #2 left shoulder strain doubt serious injury. Plan oxycodone for pain. #25/325 use as needed chlorzoxazone when necessary for spasm. Phenergan for nausea. Small number written. Warning signs discussed. Z-Pak. Symptomatic care discussed. WSL

## 2013-09-25 DIAGNOSIS — G4700 Insomnia, unspecified: Secondary | ICD-10-CM | POA: Diagnosis not present

## 2013-09-25 DIAGNOSIS — Z888 Allergy status to other drugs, medicaments and biological substances status: Secondary | ICD-10-CM | POA: Diagnosis not present

## 2013-09-25 DIAGNOSIS — Z8719 Personal history of other diseases of the digestive system: Secondary | ICD-10-CM | POA: Diagnosis not present

## 2013-09-25 DIAGNOSIS — Z79899 Other long term (current) drug therapy: Secondary | ICD-10-CM | POA: Diagnosis not present

## 2013-09-25 DIAGNOSIS — E119 Type 2 diabetes mellitus without complications: Secondary | ICD-10-CM | POA: Diagnosis not present

## 2013-09-25 DIAGNOSIS — G43909 Migraine, unspecified, not intractable, without status migrainosus: Secondary | ICD-10-CM | POA: Diagnosis not present

## 2013-09-25 DIAGNOSIS — R109 Unspecified abdominal pain: Secondary | ICD-10-CM | POA: Diagnosis not present

## 2013-09-25 DIAGNOSIS — G8929 Other chronic pain: Secondary | ICD-10-CM | POA: Diagnosis not present

## 2013-10-06 ENCOUNTER — Other Ambulatory Visit: Payer: Self-pay

## 2013-10-06 ENCOUNTER — Telehealth: Payer: Self-pay | Admitting: Family Medicine

## 2013-10-06 MED ORDER — OMEPRAZOLE 20 MG PO CPDR: 20.0000 mg | DELAYED_RELEASE_CAPSULE | Freq: Two times a day (BID) | ORAL | Status: DC

## 2013-10-06 NOTE — Telephone Encounter (Signed)
Ok plus 11 ref, if they require precrt will need to be done by [employer] [facility]

## 2013-10-06 NOTE — Telephone Encounter (Signed)
Can refill omep with prn ref

## 2013-10-06 NOTE — Telephone Encounter (Signed)
Patient needs Rx for protonix 2 times daily and would like this sent to [facility]

## 2013-10-06 NOTE — Telephone Encounter (Signed)
Patient states that she changed her mind and wants the omeprazole refilled instead of prescribing the protonix. Refill sent in to [facility].

## 2013-10-06 NOTE — Telephone Encounter (Signed)
Left message on voicemail to return call. (Need dosage of Protonix 20 or 40 mg)

## 2013-10-06 NOTE — Telephone Encounter (Signed)
This medication has not been prescribed by our office. Patient states that a doctor at [facility] prescribed this for her in the past and she wants to know if the doctor here will write for it now.

## 2013-10-15 ENCOUNTER — Ambulatory Visit: Payer: Medicare Other | Admitting: Family Medicine

## 2013-10-15 DIAGNOSIS — G43909 Migraine, unspecified, not intractable, without status migrainosus: Secondary | ICD-10-CM | POA: Diagnosis not present

## 2013-10-19 ENCOUNTER — Ambulatory Visit (INDEPENDENT_AMBULATORY_CARE_PROVIDER_SITE_OTHER): Payer: Medicare Other | Admitting: Family Medicine

## 2013-10-19 ENCOUNTER — Encounter: Payer: Self-pay | Admitting: Family Medicine

## 2013-10-19 VITALS — BP 110/72 | Ht 63.0 in | Wt 126.0 lb

## 2013-10-19 DIAGNOSIS — G43909 Migraine, unspecified, not intractable, without status migrainosus: Secondary | ICD-10-CM | POA: Diagnosis not present

## 2013-10-19 DIAGNOSIS — R110 Nausea: Secondary | ICD-10-CM

## 2013-10-19 MED ORDER — PROMETHAZINE HCL 25 MG/ML IJ SOLN
25.0000 mg | Freq: Once | INTRAMUSCULAR | Status: AC
  Administered 2013-10-19: 25 mg via INTRAMUSCULAR

## 2013-10-19 MED ORDER — TOPIRAMATE 50 MG PO TABS: 50.0000 mg | ORAL_TABLET | Freq: Every day | ORAL | Status: DC

## 2013-10-19 MED ORDER — RIZATRIPTAN BENZOATE 10 MG PO TBDP: 10.0000 mg | ORAL_TABLET | ORAL | Status: DC | PRN

## 2013-10-19 NOTE — Progress Notes (Signed)
   Subjective:    Patient ID: [name] [name], female    DOB: 1971-04-16, 43 y.o.   MRN: [id number]  HPI  Patient arrives to discuss a recent increase in her migraine headaches.pt has had flare of migr headaches.back part of the head. Some nausea with the headache. Headaches fairly severe at times. Some increased stress at home latelu  Nerve block injections of the mesenteric nerve, some assistance,   Patient also reports severe stomach pain that started last night. Reports now the pain is better. Of note patient has a GI specialist. Patient also has a chronic pain specialist. Currently pain is better.  Review of Systems    no chest pain no back pain no rash no vomiting no change in bowel habits ROS otherwise negative Objective:   Physical Exam  Alert moderate malaise H&T neck supple TMs normal, it is sharp lungs clear heart regular in rhythm.      Assessment & Plan:  Impression 1 migraine headaches discussed at length plan resume Topamax an old dose that worked before. Trial of serotonin agonists. Rationale discussed. This also helped in the past. WSL followup as needed

## 2013-10-20 ENCOUNTER — Telehealth: Payer: Self-pay | Admitting: Family Medicine

## 2013-10-20 NOTE — Telephone Encounter (Signed)
Sorry, pt has hx of bipolar dx and will need to get any nerve pills from [facility] (and , by the way, don't tell tpt this but she once intentionally overdosed on a prescription of nerve pills that I gave her in the past--this is why we avoid rx'ing bipolar folks with [facility])

## 2013-10-20 NOTE — Telephone Encounter (Signed)
Notified patient sorry, pt has hx of bipolar dx and will need to get any nerve pills from [facility]. Patient verbalized understanding.

## 2013-10-20 NOTE — Telephone Encounter (Signed)
Patient says she was just seen yesterday and is wanting something called in for her anxiety.  [facility]

## 2013-10-28 DIAGNOSIS — Z79899 Other long term (current) drug therapy: Secondary | ICD-10-CM | POA: Diagnosis not present

## 2013-10-28 DIAGNOSIS — Z8719 Personal history of other diseases of the digestive system: Secondary | ICD-10-CM | POA: Diagnosis not present

## 2013-10-28 DIAGNOSIS — R109 Unspecified abdominal pain: Secondary | ICD-10-CM | POA: Diagnosis not present

## 2013-10-30 ENCOUNTER — Emergency Department (HOSPITAL_COMMUNITY)
Admission: EM | Admit: 2013-10-30 | Discharge: 2013-10-30 | Disposition: A | Discharge status: Home / Self Care | Payer: Medicare Other | Attending: Emergency Medicine | Admitting: Emergency Medicine

## 2013-10-30 ENCOUNTER — Encounter (HOSPITAL_COMMUNITY): Payer: Self-pay | Admitting: Emergency Medicine

## 2013-10-30 ENCOUNTER — Emergency Department (HOSPITAL_COMMUNITY): Payer: Medicare Other

## 2013-10-30 DIAGNOSIS — R1084 Generalized abdominal pain: Secondary | ICD-10-CM | POA: Diagnosis not present

## 2013-10-30 DIAGNOSIS — Z79899 Other long term (current) drug therapy: Secondary | ICD-10-CM | POA: Insufficient documentation

## 2013-10-30 DIAGNOSIS — B9689 Other specified bacterial agents as the cause of diseases classified elsewhere: Secondary | ICD-10-CM | POA: Diagnosis not present

## 2013-10-30 DIAGNOSIS — Z90710 Acquired absence of both cervix and uterus: Secondary | ICD-10-CM | POA: Insufficient documentation

## 2013-10-30 DIAGNOSIS — Z87442 Personal history of urinary calculi: Secondary | ICD-10-CM | POA: Diagnosis not present

## 2013-10-30 DIAGNOSIS — K5900 Constipation, unspecified: Secondary | ICD-10-CM | POA: Insufficient documentation

## 2013-10-30 DIAGNOSIS — R112 Nausea with vomiting, unspecified: Secondary | ICD-10-CM | POA: Diagnosis not present

## 2013-10-30 DIAGNOSIS — R1013 Epigastric pain: Secondary | ICD-10-CM | POA: Diagnosis not present

## 2013-10-30 DIAGNOSIS — F3289 Other specified depressive episodes: Secondary | ICD-10-CM | POA: Insufficient documentation

## 2013-10-30 DIAGNOSIS — F329 Major depressive disorder, single episode, unspecified: Secondary | ICD-10-CM | POA: Insufficient documentation

## 2013-10-30 DIAGNOSIS — G8929 Other chronic pain: Secondary | ICD-10-CM | POA: Diagnosis not present

## 2013-10-30 DIAGNOSIS — N760 Acute vaginitis: Secondary | ICD-10-CM | POA: Diagnosis not present

## 2013-10-30 DIAGNOSIS — K7689 Other specified diseases of liver: Secondary | ICD-10-CM | POA: Diagnosis not present

## 2013-10-30 DIAGNOSIS — Z9089 Acquired absence of other organs: Secondary | ICD-10-CM | POA: Diagnosis not present

## 2013-10-30 DIAGNOSIS — E119 Type 2 diabetes mellitus without complications: Secondary | ICD-10-CM | POA: Insufficient documentation

## 2013-10-30 DIAGNOSIS — F319 Bipolar disorder, unspecified: Secondary | ICD-10-CM | POA: Diagnosis not present

## 2013-10-30 DIAGNOSIS — R748 Abnormal levels of other serum enzymes: Secondary | ICD-10-CM | POA: Diagnosis not present

## 2013-10-30 DIAGNOSIS — F411 Generalized anxiety disorder: Secondary | ICD-10-CM | POA: Insufficient documentation

## 2013-10-30 DIAGNOSIS — A499 Bacterial infection, unspecified: Secondary | ICD-10-CM | POA: Diagnosis not present

## 2013-10-30 DIAGNOSIS — Z794 Long term (current) use of insulin: Secondary | ICD-10-CM | POA: Diagnosis not present

## 2013-10-30 DIAGNOSIS — N390 Urinary tract infection, site not specified: Secondary | ICD-10-CM | POA: Diagnosis not present

## 2013-10-30 DIAGNOSIS — R109 Unspecified abdominal pain: Secondary | ICD-10-CM | POA: Diagnosis not present

## 2013-10-30 DIAGNOSIS — Z9889 Other specified postprocedural states: Secondary | ICD-10-CM | POA: Diagnosis not present

## 2013-10-30 DIAGNOSIS — I498 Other specified cardiac arrhythmias: Secondary | ICD-10-CM | POA: Diagnosis not present

## 2013-10-30 LAB — LIPASE, BLOOD: LIPASE: 14 U/L (ref 11–59)

## 2013-10-30 LAB — CBC WITH DIFFERENTIAL/PLATELET
BASOS ABS: 0 10*3/uL (ref 0.0–0.1)
BASOS PCT: 0 % (ref 0–1)
EOS ABS: 0.1 10*3/uL (ref 0.0–0.7)
Eosinophils Relative: 1 % (ref 0–5)
HCT: 45.8 % (ref 36.0–46.0)
HEMOGLOBIN: 16 g/dL — AB (ref 12.0–15.0)
Lymphocytes Relative: 46 % (ref 12–46)
Lymphs Abs: 2.4 10*3/uL (ref 0.7–4.0)
MCH: 32.6 pg (ref 26.0–34.0)
MCHC: 34.9 g/dL (ref 30.0–36.0)
MCV: 93.3 fL (ref 78.0–100.0)
MONOS PCT: 5 % (ref 3–12)
Monocytes Absolute: 0.3 10*3/uL (ref 0.1–1.0)
NEUTROS PCT: 48 % (ref 43–77)
Neutro Abs: 2.6 10*3/uL (ref 1.7–7.7)
Platelets: 52 10*3/uL — ABNORMAL LOW (ref 150–400)
RBC: 4.91 MIL/uL (ref 3.87–5.11)
RDW: 12.9 % (ref 11.5–15.5)
WBC: 5.3 10*3/uL (ref 4.0–10.5)

## 2013-10-30 LAB — COMPREHENSIVE METABOLIC PANEL
ALBUMIN: 3.7 g/dL (ref 3.5–5.2)
ALK PHOS: 118 U/L — AB (ref 39–117)
ALT: 86 U/L — ABNORMAL HIGH (ref 0–35)
AST: 79 U/L — AB (ref 0–37)
BUN: 16 mg/dL (ref 6–23)
CO2: 22 mEq/L (ref 19–32)
Calcium: 9.1 mg/dL (ref 8.4–10.5)
Chloride: 98 mEq/L (ref 96–112)
Creatinine, Ser: 0.56 mg/dL (ref 0.50–1.10)
GFR calc Af Amer: >90 mL/min (ref >90)
GFR calc non Af Amer: >90 mL/min (ref >90)
Glucose, Bld: 502 mg/dL — ABNORMAL HIGH (ref 70–99)
POTASSIUM: 4.8 meq/L (ref 3.7–5.3)
Sodium: 133 mEq/L — ABNORMAL LOW (ref 137–147)
TOTAL PROTEIN: 8 g/dL (ref 6.0–8.3)
Total Bilirubin: 0.5 mg/dL (ref 0.3–1.2)

## 2013-10-30 LAB — URINALYSIS, ROUTINE W REFLEX MICROSCOPIC
Bilirubin Urine: NEGATIVE
Glucose, UA: >1000 mg/dL — AB
Ketones, ur: NEGATIVE mg/dL
LEUKOCYTES UA: NEGATIVE
NITRITE: NEGATIVE
Protein, ur: NEGATIVE mg/dL
SPECIFIC GRAVITY, URINE: 1.015 (ref 1.005–1.030)
Urobilinogen, UA: 0.2 mg/dL (ref 0.0–1.0)
pH: 6 (ref 5.0–8.0)

## 2013-10-30 LAB — URINE MICROSCOPIC-ADD ON

## 2013-10-30 LAB — AMYLASE: AMYLASE: 33 U/L (ref 0–105)

## 2013-10-30 LAB — GLUCOSE, CAPILLARY: Glucose-Capillary: 344 mg/dL — ABNORMAL HIGH (ref 70–99)

## 2013-10-30 MED ORDER — INSULIN ASPART 100 UNIT/ML ~~LOC~~ SOLN
10.0000 [IU] | Freq: Once | SUBCUTANEOUS | Status: AC
  Administered 2013-10-30: 10 [IU] via INTRAVENOUS
  Filled 2013-10-30: qty 1

## 2013-10-30 MED ORDER — SODIUM CHLORIDE 0.9 % IV BOLUS (SEPSIS)
500.0000 mL | Freq: Once | INTRAVENOUS | Status: AC
  Administered 2013-10-30: 500 mL via INTRAVENOUS

## 2013-10-30 MED ORDER — ONDANSETRON HCL 4 MG/2ML IJ SOLN
4.0000 mg | Freq: Once | INTRAMUSCULAR | Status: AC
  Administered 2013-10-30: 4 mg via INTRAVENOUS
  Filled 2013-10-30: qty 2

## 2013-10-30 MED ORDER — ONDANSETRON 4 MG PO TBDP: 4.0000 mg | ORAL_TABLET | Freq: Three times a day (TID) | ORAL | Status: DC | PRN

## 2013-10-30 MED ORDER — HYDROMORPHONE HCL PF 1 MG/ML IJ SOLN: 1.0000 mg | Freq: Once | INTRAMUSCULAR | Status: DC

## 2013-10-30 MED ORDER — PANTOPRAZOLE SODIUM 40 MG IV SOLR
40.0000 mg | Freq: Once | INTRAVENOUS | Status: AC
  Administered 2013-10-30: 40 mg via INTRAVENOUS
  Filled 2013-10-30: qty 40

## 2013-10-30 MED ORDER — IOHEXOL 300 MG/ML  SOLN
50.0000 mL | Freq: Once | INTRAMUSCULAR | Status: AC | PRN
  Administered 2013-10-30: 50 mL via ORAL

## 2013-10-30 MED ORDER — ONDANSETRON HCL 4 MG/2ML IJ SOLN: 4.0000 mg | Freq: Once | INTRAMUSCULAR | Status: DC

## 2013-10-30 MED ORDER — SODIUM CHLORIDE 0.9 % IV SOLN
INTRAVENOUS | Status: DC
  Administered 2013-10-30: 15:00:00 via INTRAVENOUS

## 2013-10-30 MED ORDER — HYDROMORPHONE HCL PF 1 MG/ML IJ SOLN
1.0000 mg | Freq: Once | INTRAMUSCULAR | Status: AC
  Administered 2013-10-30: 1 mg via INTRAVENOUS
  Filled 2013-10-30: qty 1

## 2013-10-30 MED ORDER — INSULIN REGULAR BOLUS VIA INFUSION
10.0000 [IU] | Freq: Once | INTRAVENOUS | Status: DC
Start: 2013-10-30 — End: 2013-10-30
  Filled 2013-10-30: qty 10

## 2013-10-30 MED ORDER — SODIUM CHLORIDE 0.9 % IV BOLUS (SEPSIS)
500.0000 mL | Freq: Once | INTRAVENOUS | Status: AC
Start: 2013-10-30 — End: 2013-10-30
  Administered 2013-10-30: 500 mL via INTRAVENOUS

## 2013-10-30 MED ORDER — HYDROCODONE-ACETAMINOPHEN 5-325 MG PO TABS: 1.0000 | ORAL_TABLET | Freq: Four times a day (QID) | ORAL | Status: DC | PRN

## 2013-10-30 MED ORDER — IOHEXOL 300 MG/ML  SOLN
100.0000 mL | Freq: Once | INTRAMUSCULAR | Status: AC | PRN
  Administered 2013-10-30: 100 mL via INTRAVENOUS

## 2013-10-30 NOTE — ED Notes (Signed)
Pt's pump was beeping. Nurse into room to check pump. Pt was pushing buttons on pump. Pt instructed not to touch the pump, the nurse would take care of it.

## 2013-10-30 NOTE — Discharge Instructions (Signed)
Abdominal Pain, Adult Many things can cause belly (abdominal) pain. Most times, the belly pain is not dangerous. Many cases of belly pain can be watched and treated at home. HOME CARE   Do not take medicines that help you go poop (laxatives) unless told to by your doctor.  Only take medicine as told by your doctor.  Eat or drink as told by your doctor. Your doctor will tell you if you should be on a special diet. GET HELP IF:  You do not know what is causing your belly pain.  You have belly pain while you are sick to your stomach (nauseous) or have runny poop (diarrhea).  You have pain while you pee or poop.  Your belly pain wakes you up at night.  You have belly pain that gets worse or better when you eat.  You have belly pain that gets worse when you eat fatty foods. GET HELP RIGHT AWAY IF:   The pain does not go away within 2 hours.  You have a fever.  You keep throwing up (vomiting).  The pain changes and is only in the right or left part of the belly.  You have bloody or tarry looking poop. MAKE SURE YOU:   Understand these instructions.  Will watch your condition.  Will get help right away if you are not doing well or get worse. Document Released: 03/12/2008 Document Revised: 07/15/2013 Document Reviewed: 06/03/2013 [facility] Patient Information 2014 [place], [place].  Workup for the abdominal pain without any significant findings. Labs without significant abnormalities CT scan was read as normal. Take pain medicine antinausea medicine as directed. Followup with your doctor as needed. Return for any new or worse symptoms.

## 2013-10-30 NOTE — ED Provider Notes (Addendum)
CSN: [id number]     Arrival date & time 10/30/13  1037 History   First MD Initiated Contact with Patient 10/30/13 1426     Chief Complaint  Patient presents with  . Abdominal Pain   (Consider location/radiation/quality/duration/timing/severity/associated sxs/prior Treatment) Patient is a 43 y.o. female presenting with abdominal pain. The history is provided by the patient.  Abdominal Pain Associated symptoms: nausea and vomiting   Associated symptoms: no chest pain, no diarrhea, no dysuria, no fever and no shortness of breath    patient with four-day history of epigastric and lower quadrant abdominal pain. With vomiting. No diarrhea. Patient states pain is 8/10. Today she vomited twice today. Second vomit had a little bit of blood in it. Patient without any further vomiting of blood since then. No blood in bowel movements. The lower quadrant abdominal pain is worse in the epigastric pain. Pain is nonradiating described as sharp. Not made worse or better by anything.  Past Medical History  Diagnosis Date  . Pancreatitis   . Diabetes mellitus   . Esophageal ulcer 1997  . Depression   . Anxiety   . Migraine headache   . Bipolar disorder     bipolar was a misdiagnoses   . Glucose intolerance (impaired glucose tolerance)   . Chronic pancreatitis   . Kidney stones    Past Surgical History  Procedure Laterality Date  . Whipple procedure    . Intestinal blockage    . Abdominal surgery    . Abdominal hysterectomy    . Cholecystectomy    . Cesarean section    . Colonoscopy     History reviewed. No pertinent family history. History  Substance Use Topics  . Smoking status: Never Smoker   . Smokeless tobacco: Not on file  . Alcohol Use: No   OB History   Grav Para Term Preterm Abortions TAB SAB Ect Mult Living                 Review of Systems  Constitutional: Negative for fever.  HENT: Negative for congestion.   Eyes: Negative for redness.  Respiratory: Negative for  shortness of breath.   Cardiovascular: Negative for chest pain.  Gastrointestinal: Positive for nausea, vomiting and abdominal pain. Negative for diarrhea and blood in stool.  Genitourinary: Negative for dysuria.  Musculoskeletal: Negative for back pain.  Skin: Negative for rash.  Neurological: Negative for headaches.  Hematological: Does not bruise/bleed easily.  Psychiatric/Behavioral: Negative for confusion.    Allergies  Buspar and Metoclopramide hcl  Home Medications   Current Outpatient Rx  Name  Route  Sig  Dispense  Refill  . bisacodyl (DULCOLAX) 5 MG EC tablet   Oral   Take 2 tablets (10 mg total) by mouth daily as needed for moderate constipation.   30 tablet   0   . DULoxetine (CYMBALTA) 60 MG capsule   Oral   Take 60 mg by mouth 2 (two) times daily.          . Eszopiclone (ESZOPICLONE) 3 MG TABS   Oral   Take 3 mg by mouth at bedtime. Take immediately before bedtime         . insulin glargine (LANTUS) 100 UNIT/ML injection   Subcutaneous   Inject 20 Units into the skin at bedtime.          . insulin lispro (HUMALOG) 100 UNIT/ML injection   Subcutaneous   Inject 0-9 Units into the skin 3 (three) times daily. 0-9 Units, Subcutaneous, 3  times daily with meals, CBG < 70: Drink or eat something sweet and recheck blood sugar. CBG 70 - 120: 0 units CBG 121 - 150: 1 unit CBG 151 - 200: 2 units CBG 201 - 250: 3 units CBG 251 - 300: 5 units CBG 301 - 350: 7 units CBG 351 - 400: 9 units CBG > 400: call MD         . omeprazole (PRILOSEC) 20 MG capsule   Oral   Take 1 capsule (20 mg total) by mouth 2 (two) times daily.   60 capsule   11   . ondansetron (ZOFRAN) 4 MG tablet   Oral   Take 4 mg by mouth every 8 (eight) hours as needed for nausea.          Marland Kitchen. dronabinol (MARINOL) 5 MG capsule   Oral   Take 5 mg by mouth 4 (four) times daily as needed (for appetite).          Marland Kitchen. HYDROcodone-acetaminophen (NORCO/VICODIN) 5-325 MG per tablet   Oral    Take 1-2 tablets by mouth every 6 (six) hours as needed for moderate pain.   14 tablet   0   . ondansetron (ZOFRAN ODT) 4 MG disintegrating tablet   Oral   Take 1 tablet (4 mg total) by mouth every 8 (eight) hours as needed.   10 tablet   1    BP 116/73  Pulse 91  Temp(Src) 97.4 F (36.3 C) (Oral)  Resp 16  Ht 5\' 4"  (1.626 m)  Wt 120 lb (54.432 kg)  BMI 20.59 kg/m2  SpO2 93% Physical Exam  Nursing note and vitals reviewed. Constitutional: She is oriented to person, place, and time. She appears well-developed and well-nourished. No distress.  HENT:  Head: Normocephalic and atraumatic.  Mouth/Throat: Oropharynx is clear and moist.  Eyes: Conjunctivae and EOM are normal. Pupils are equal, round, and reactive to light.  Neck: Normal range of motion.  Cardiovascular: Normal rate, regular rhythm and normal heart sounds.   No murmur heard. Pulmonary/Chest: Effort normal and breath sounds normal.  Abdominal: Soft. Bowel sounds are normal. There is no tenderness.  Musculoskeletal: Normal range of motion. She exhibits no edema.  Neurological: She is alert and oriented to person, place, and time. No cranial nerve deficit. She exhibits normal muscle tone. Coordination normal.  Skin: Skin is warm. No rash noted.    ED Course  Procedures (including critical care time) Labs Review Labs Reviewed  CBC WITH DIFFERENTIAL - Abnormal; Notable for the following:    Hemoglobin 16.0 (*)    Platelets 52 (*)    All other components within normal limits  COMPREHENSIVE METABOLIC PANEL - Abnormal; Notable for the following:    Sodium 133 (*)    Glucose, Bld 502 (*)    AST 79 (*)    ALT 86 (*)    Alkaline Phosphatase 118 (*)    All other components within normal limits  URINALYSIS, ROUTINE W REFLEX MICROSCOPIC - Abnormal; Notable for the following:    APPearance CLOUDY (*)    Glucose, UA >1000 (*)    Hgb urine dipstick SMALL (*)    All other components within normal limits  GLUCOSE,  CAPILLARY - Abnormal; Notable for the following:    Glucose-Capillary 344 (*)    All other components within normal limits  URINE MICROSCOPIC-ADD ON - Abnormal; Notable for the following:    Squamous Epithelial / LPF FEW (*)    Bacteria, UA MANY (*)  All other components within normal limits  URINE CULTURE  AMYLASE  LIPASE, BLOOD   Imaging Review Ct Abdomen Pelvis W Contrast  10/30/2013   CLINICAL DATA:  Lower abdominal pain. Pancreatitis. Previous Whipple procedure and hysterectomy.  EXAM: CT ABDOMEN AND PELVIS WITH CONTRAST  TECHNIQUE: Multidetector CT imaging of the abdomen and pelvis was performed using the standard protocol following bolus administration of intravenous contrast.  CONTRAST:  OMNIPAQUE IOHEXOL 300 MG/ML  SOLN  COMPARISON:  10/13/2012  FINDINGS: Postop changes from prior Whipple procedure are stable in appearance. Multiple small less than 1 cm peripancreatic and central mesenteric lymph nodes remain stable. No pathologically enlarged nodes or other masses identified within the abdomen or pelvis. Mild calcification and pancreatic ductal dilatation in the residual portion the pancreatic tail is stable in appearance.  Hepatic steatosis is stable, and no liver masses are identified. Gallbladder is unremarkable. Mild pneumobilia again noted. The spleen, adrenal glands, and kidneys are normal in appearance. No evidence of hydronephrosis.  No evidence of acute inflammatory process or abnormal fluid collections within the abdomen or pelvis. Prior hysterectomy noted. Adnexal regions are unremarkable. No evidence of bowel obstruction. Large colonic stool burden again noted, but not significant changed.  IMPRESSION: Stable postoperative changes from Whipple procedure. No acute findings.  Stable hepatic steatosis and large colonic stool burden.   Electronically Signed   By: Myles Rosenthal M.D.   On: 10/30/2013 17:24    EKG Interpretation   None       MDM   1. Abdominal pain     Workup for the abdominal pain without any sniffing and her maladies to include CT of the abdomen. Patient's pain is improved with pain medication. Will discharge home with the short course of hydrocodone and Zofran. Followup with primary care Dr. return for new or worse symptoms. No evidence of pancreatitis.  The patient's blood sugar was in the 500s with IV insulin he came down to 300. No evidence of any diabetic ketoacidosis. Patient has a known history of diabetes does have a sliding scale available at home.    Shelda Jakes, MD 10/30/13 Elfredia Nevins  Shelda Jakes, MD 10/30/13 [phone]

## 2013-10-30 NOTE — ED Notes (Signed)
Pt requesting more pain medication.  Reports pain unchanged 8/10.  Notified edp

## 2013-10-30 NOTE — ED Notes (Signed)
Told by registration that pt was leaning in w/c and afraid she was going to pass out. Within few seconds, Dr Emelda FearFerguson says he had checked the pt and her pulse was weak and she needed to come back.   When coming down the hall with pt, her eyelids were fluttering.  I told her that I knew her was awake and to open her eyes which she promptly did and talking appropriately.

## 2013-10-30 NOTE — ED Notes (Signed)
Low abd pain for 4 days  With vomiting.  , Seen by Dr Gerda DissLuking on Monday.

## 2013-10-31 ENCOUNTER — Encounter (HOSPITAL_COMMUNITY): Payer: Self-pay | Admitting: Emergency Medicine

## 2013-10-31 ENCOUNTER — Emergency Department (HOSPITAL_COMMUNITY)
Admission: EM | Admit: 2013-10-31 | Discharge: 2013-10-31 | Disposition: A | Discharge status: Home / Self Care | Payer: Medicare Other | Attending: Emergency Medicine | Admitting: Emergency Medicine

## 2013-10-31 DIAGNOSIS — Z9889 Other specified postprocedural states: Secondary | ICD-10-CM | POA: Diagnosis not present

## 2013-10-31 DIAGNOSIS — F319 Bipolar disorder, unspecified: Secondary | ICD-10-CM | POA: Insufficient documentation

## 2013-10-31 DIAGNOSIS — Z794 Long term (current) use of insulin: Secondary | ICD-10-CM | POA: Insufficient documentation

## 2013-10-31 DIAGNOSIS — Z9089 Acquired absence of other organs: Secondary | ICD-10-CM | POA: Diagnosis not present

## 2013-10-31 DIAGNOSIS — Z792 Long term (current) use of antibiotics: Secondary | ICD-10-CM | POA: Diagnosis not present

## 2013-10-31 DIAGNOSIS — N390 Urinary tract infection, site not specified: Secondary | ICD-10-CM | POA: Diagnosis not present

## 2013-10-31 DIAGNOSIS — Z8679 Personal history of other diseases of the circulatory system: Secondary | ICD-10-CM | POA: Insufficient documentation

## 2013-10-31 DIAGNOSIS — G8929 Other chronic pain: Secondary | ICD-10-CM | POA: Diagnosis not present

## 2013-10-31 DIAGNOSIS — Z90710 Acquired absence of both cervix and uterus: Secondary | ICD-10-CM | POA: Insufficient documentation

## 2013-10-31 DIAGNOSIS — E119 Type 2 diabetes mellitus without complications: Secondary | ICD-10-CM | POA: Insufficient documentation

## 2013-10-31 DIAGNOSIS — Z79899 Other long term (current) drug therapy: Secondary | ICD-10-CM | POA: Diagnosis not present

## 2013-10-31 DIAGNOSIS — N760 Acute vaginitis: Secondary | ICD-10-CM | POA: Insufficient documentation

## 2013-10-31 DIAGNOSIS — K5900 Constipation, unspecified: Secondary | ICD-10-CM | POA: Insufficient documentation

## 2013-10-31 DIAGNOSIS — F411 Generalized anxiety disorder: Secondary | ICD-10-CM | POA: Insufficient documentation

## 2013-10-31 DIAGNOSIS — R112 Nausea with vomiting, unspecified: Secondary | ICD-10-CM | POA: Insufficient documentation

## 2013-10-31 DIAGNOSIS — B9689 Other specified bacterial agents as the cause of diseases classified elsewhere: Secondary | ICD-10-CM | POA: Diagnosis not present

## 2013-10-31 DIAGNOSIS — R109 Unspecified abdominal pain: Secondary | ICD-10-CM | POA: Diagnosis present

## 2013-10-31 DIAGNOSIS — A499 Bacterial infection, unspecified: Secondary | ICD-10-CM | POA: Diagnosis not present

## 2013-10-31 DIAGNOSIS — Z87442 Personal history of urinary calculi: Secondary | ICD-10-CM | POA: Diagnosis not present

## 2013-10-31 LAB — URINALYSIS, ROUTINE W REFLEX MICROSCOPIC
Bilirubin Urine: NEGATIVE
Glucose, UA: >1000 mg/dL — AB
Ketones, ur: NEGATIVE mg/dL
LEUKOCYTES UA: NEGATIVE
NITRITE: POSITIVE — AB
PROTEIN: NEGATIVE mg/dL
Specific Gravity, Urine: 1.015 (ref 1.005–1.030)
UROBILINOGEN UA: 1 mg/dL (ref 0.0–1.0)
pH: 6.5 (ref 5.0–8.0)

## 2013-10-31 LAB — URINE MICROSCOPIC-ADD ON

## 2013-10-31 LAB — GLUCOSE, CAPILLARY: GLUCOSE-CAPILLARY: 332 mg/dL — AB (ref 70–99)

## 2013-10-31 LAB — OCCULT BLOOD, POC DEVICE: Fecal Occult Bld: NEGATIVE

## 2013-10-31 LAB — WET PREP, GENITAL
Trich, Wet Prep: NONE SEEN
Yeast Wet Prep HPF POC: NONE SEEN

## 2013-10-31 LAB — RPR: RPR Ser Ql: NONREACTIVE

## 2013-10-31 MED ORDER — CEPHALEXIN 500 MG PO CAPS: 500.0000 mg | ORAL_CAPSULE | Freq: Four times a day (QID) | ORAL | Status: DC

## 2013-10-31 MED ORDER — SODIUM CHLORIDE 0.9 % IV SOLN
Freq: Once | INTRAVENOUS | Status: AC
  Administered 2013-10-31: 14:00:00 via INTRAVENOUS

## 2013-10-31 MED ORDER — KETOROLAC TROMETHAMINE 30 MG/ML IJ SOLN
30.0000 mg | Freq: Once | INTRAMUSCULAR | Status: AC
  Administered 2013-10-31: 30 mg via INTRAVENOUS
  Filled 2013-10-31: qty 1

## 2013-10-31 MED ORDER — ONDANSETRON HCL 4 MG/2ML IJ SOLN
4.0000 mg | Freq: Once | INTRAMUSCULAR | Status: AC
  Administered 2013-10-31: 4 mg via INTRAVENOUS
  Filled 2013-10-31: qty 2

## 2013-10-31 MED ORDER — PEG 3350-KCL-NA BICARB-NACL 420 G PO SOLR: 4000.0000 mL | Freq: Once | ORAL | Status: DC

## 2013-10-31 MED ORDER — ONDANSETRON HCL 4 MG/2ML IJ SOLN
4.0000 mg | Freq: Once | INTRAMUSCULAR | Status: AC
  Administered 2013-10-31: 4 mg via INTRAVENOUS

## 2013-10-31 MED ORDER — ONDANSETRON HCL 4 MG/2ML IJ SOLN
INTRAMUSCULAR | Status: AC
  Administered 2013-10-31: 4 mg via INTRAVENOUS
  Filled 2013-10-31: qty 2

## 2013-10-31 MED ORDER — METRONIDAZOLE 250 MG PO TABS: 250.0000 mg | ORAL_TABLET | Freq: Three times a day (TID) | ORAL | Status: DC

## 2013-10-31 MED ORDER — HYDROMORPHONE HCL 2 MG PO TABS: 2.0000 mg | ORAL_TABLET | ORAL | Status: DC | PRN

## 2013-10-31 MED ORDER — HYDROMORPHONE HCL PF 2 MG/ML IJ SOLN
2.0000 mg | Freq: Once | INTRAMUSCULAR | Status: AC
Start: 2013-10-31 — End: 2013-10-31
  Administered 2013-10-31: 2 mg via INTRAVENOUS
  Filled 2013-10-31: qty 1

## 2013-10-31 NOTE — ED Provider Notes (Signed)
CSN: [id number]     Arrival date & time 10/31/13  1205 History  This chart was scribed for Flint Melter, MD by Shari Heritage, ED Scribe. The patient was seen in room APA14/APA14. Patient's care was started at 1:23 PM.    Chief Complaint  Patient presents with  . Abdominal Pain  . Emesis    The history is provided by the patient. No language interpreter was used.    HPI Comments: [name] [name] is a 43 y.o. female who presents to the Emergency Department complaining of constant, moderate upper and lower abdominal pain onset 5 days ago. There is associated nausea and vomiting. She is also reporting vaginal bleeding, dysuria and hematuria. Her last bowel movement was yesterday and stool was soft. She denies fever, diarrhea, cough, chest pain, or weakness. She states that she has recurrent abdominal pain since her whipple procedure. She was seen here yesterday for abdominal pain and her pain was improved in the ED with pain medication. She was also given IV insulin to help treat her hypoglycemia. She was sent home with hydrocodone and Zofran. She also has a surgical history of cholecystectomy and hysterectomy.  Her medical history include DM, pancreatitis, kidney stones and bipolar disorder.   Past Medical History  Diagnosis Date  . Pancreatitis   . Diabetes mellitus   . Esophageal ulcer 1997  . Depression   . Anxiety   . Migraine headache   . Bipolar disorder     bipolar was a misdiagnoses   . Glucose intolerance (impaired glucose tolerance)   . Chronic pancreatitis   . Kidney stones    Past Surgical History  Procedure Laterality Date  . Whipple procedure    . Intestinal blockage    . Abdominal surgery    . Abdominal hysterectomy    . Cholecystectomy    . Cesarean section    . Colonoscopy     History reviewed. No pertinent family history. History  Substance Use Topics  . Smoking status: Never Smoker   . Smokeless tobacco: Not on file  . Alcohol Use: No   OB History    Grav Para Term Preterm Abortions TAB SAB Ect Mult Living                 Review of Systems  Constitutional: Negative for fever.  Respiratory: Negative for cough.   Cardiovascular: Negative for chest pain.  Gastrointestinal: Positive for nausea, vomiting and abdominal pain.  Genitourinary: Positive for dysuria, hematuria and vaginal bleeding. Negative for vaginal discharge.  Neurological: Negative for weakness.  All other systems reviewed and are negative.    Allergies  Buspar and Metoclopramide hcl  Home Medications   Current Outpatient Rx  Name  Route  Sig  Dispense  Refill  . dronabinol (MARINOL) 5 MG capsule   Oral   Take 5 mg by mouth 4 (four) times daily as needed (for appetite).          . DULoxetine (CYMBALTA) 60 MG capsule   Oral   Take 60 mg by mouth 2 (two) times daily.          . Eszopiclone (ESZOPICLONE) 3 MG TABS   Oral   Take 3 mg by mouth at bedtime. Take immediately before bedtime         . insulin glargine (LANTUS) 100 UNIT/ML injection   Subcutaneous   Inject 20 Units into the skin at bedtime.          . insulin lispro (HUMALOG)  100 UNIT/ML injection   Subcutaneous   Inject 0-9 Units into the skin 3 (three) times daily. 0-9 Units, Subcutaneous, 3 times daily with meals, CBG < 70: Drink or eat something sweet and recheck blood sugar. CBG 70 - 120: 0 units CBG 121 - 150: 1 unit CBG 151 - 200: 2 units CBG 201 - 250: 3 units CBG 251 - 300: 5 units CBG 301 - 350: 7 units CBG 351 - 400: 9 units CBG > 400: call MD         . ondansetron (ZOFRAN ODT) 4 MG disintegrating tablet   Oral   Take 1 tablet (4 mg total) by mouth every 8 (eight) hours as needed.   10 tablet   1   . cephALEXin (KEFLEX) 500 MG capsule   Oral   Take 1 capsule (500 mg total) by mouth 4 (four) times daily.   28 capsule   0   . HYDROmorphone (DILAUDID) 2 MG tablet   Oral   Take 1 tablet (2 mg total) by mouth every 4 (four) hours as needed for severe pain (pain).    15 tablet   0   . metroNIDAZOLE (FLAGYL) 250 MG tablet   Oral   Take 1 tablet (250 mg total) by mouth 3 (three) times daily.   21 tablet   0    Triage Vitals: BP 113/78  Pulse 93  Temp(Src) 98 F (36.7 C) (Oral)  Resp 18  SpO2 98% Physical Exam  Nursing note and vitals reviewed. Constitutional: She is oriented to person, place, and time. She appears well-developed and well-nourished.  HENT:  Head: Normocephalic and atraumatic.  Right Ear: External ear normal.  Left Ear: External ear normal.  Eyes: Conjunctivae and EOM are normal. Pupils are equal, round, and reactive to light.  Neck: Normal range of motion and phonation normal. Neck supple.  Cardiovascular: Normal rate, regular rhythm, normal heart sounds and intact distal pulses.   Pulmonary/Chest: Effort normal and breath sounds normal. She exhibits no bony tenderness.  Abdominal: Soft. Normal appearance and bowel sounds are normal. She exhibits no distension and no mass. There is tenderness (diffuse, mild). There is no rebound and no guarding.  Genitourinary:  White vaginal discharge. Normal external female genitalia. No bleeding externally. No vaginal bleeding.  No fecal impaction. Light colored tan stool.  Musculoskeletal: Normal range of motion.  Lumbar tenderness to palpation.  Neurological: She is alert and oriented to person, place, and time. No cranial nerve deficit or sensory deficit. She exhibits normal muscle tone. Coordination normal.  Skin: Skin is warm, dry and intact.  Psychiatric: She has a normal mood and affect. Her behavior is normal. Judgment and thought content normal.    ED Course  Procedures (including critical care time) DIAGNOSTIC STUDIES: Oxygen Saturation is 98% on room air, normal by my interpretation.    COORDINATION OF CARE: Medications  polyethylene glycol-electrolytes (NuLYTELY/GoLYTELY) solution 4,000 mL (4,000 mLs Oral Not Given 10/31/13 1640)  0.9 %  sodium chloride infusion (  Intravenous New Bag/Given 10/31/13 1401)  ketorolac (TORADOL) 30 MG/ML injection 30 mg (30 mg Intravenous Given 10/31/13 1401)  ondansetron (ZOFRAN) injection 4 mg (4 mg Intravenous Given 10/31/13 1401)  HYDROmorphone (DILAUDID) injection 2 mg (2 mg Intravenous Given 10/31/13 1720)  ondansetron (ZOFRAN) injection 4 mg (4 mg Intravenous Given 10/31/13 1720)    Patient Vitals for the past 24 hrs:  BP Temp Temp src Pulse Resp SpO2  10/31/13 1710 133/61 mmHg 98.1 F (36.7 C) Oral  66 20 96 %  10/31/13 1209 113/78 mmHg 98 F (36.7 C) Oral 93 18 98 %    1:27 PM- Patient informed of current plan for treatment and evaluation and agrees with plan at this time.  1:35 PM- CT scan performed yesterday shows large colonic stool burden. A review of records from [facility] show that she presented with similar symptoms in 2014 and was found to be severely constipated. She was admitted for a few days and put on an aggressive bowel therapeutics program.  2:44 PM- Performed pelvic and rectal exams with chaperone present. No vaginal bleeding or fecal impaction noted.  4:54 PM Reevaluation with update and discussion. After initial assessment and treatment, an updated evaluation reveals patient declined GoLYTELY, that have been ordered. In discussion, she stated that she would rather use her MiraLax instead. She then began what could be best described as negotiations for narcotic medication. She wanted an amount of parenteral analgesia that would treat her high tolerance to the narcotic medication. She then requested oral medication that "does not have Tylenol in it". She said that this was required because she has a "fatty liver". She also declared that she had seen a pain management doctor in the past. Dr. Laurian [place]ole, [place], but that she is not under contract, with him currently. I did discuss with her the findings from the [facility] [place], [place]. This indicates that she  frequently gets narcotics, for multiple different providers, within the last several months. I discussed with her how this does indicate to us that she has a need for chronic medications for pain and that this is best treated by a pain management physician. I further reported to her that we would not be able to continue giving her narcotic medication, for chronic pain needs. She seemed to understand this, and I gave her a copy of the data that I reviewed today. We also discussed the abnormal findings today, of possible UTI, and nonspecific vaginitis. She prefers to have antibiotics for each. Dajahnae Vondra L    Labs Review Labs Reviewed  WET PREP, GENITAL - Abnormal; Notable for the following:    Clue Cells Wet Prep HPF POC FEW (*)    WBC, Wet Prep HPF POC FEW (*)    All other components within normal limits  URINALYSIS, ROUTINE W REFLEX MICROSCOPIC - Abnormal; Notable for the following:    APPearance HAZY (*)    Glucose, UA >1000 (*)    Hgb urine dipstick TRACE (*)    Nitrite POSITIVE (*)    All other components within normal limits  GLUCOSE, CAPILLARY - Abnormal; Notable for the following:    Glucose-Capillary 332 (*)    All other components within normal limits  URINE MICROSCOPIC-ADD ON - Abnormal; Notable for the following:    Squamous Epithelial / LPF FEW (*)    Bacteria, UA MANY (*)    All other components within normal limits  URINE CULTURE  GC/CHLAMYDIA PROBE AMP  RPR  HIV ANTIBODY (ROUTINE TESTING)  OCCULT BLOOD, POC DEVICE   Imaging Review Ct Abdomen Pelvis W Contrast  10/30/2013   CLINICAL DATA:  Lower abdominal pain. Pancreatitis. Previous Whipple procedure and hysterectomy.  EXAM: CT ABDOMEN AND PELVIS WITH CONTRAST  TECHNIQUE: Multidetector CT imaging of the abdomen and pelvis was performed using the standard protocol following bolus administration of intravenous contrast.  CONTRAST:  100mL OMNIPAQUE IOHEXOL 300 MG/ML  SOLN  COMPARISON:  10/13/2012  FINDINGS: Postop changes  from prior  Whipple procedure are stable in appearance. Multiple small less than 1 cm peripancreatic and central mesenteric lymph nodes remain stable. No pathologically enlarged nodes or other masses identified within the abdomen or pelvis. Mild calcification and pancreatic ductal dilatation in the residual portion the pancreatic tail is stable in appearance.  Hepatic steatosis is stable, and no liver masses are identified. Gallbladder is unremarkable. Mild pneumobilia again noted. The spleen, adrenal glands, and kidneys are normal in appearance. No evidence of hydronephrosis.  No evidence of acute inflammatory process or abnormal fluid collections within the abdomen or pelvis. Prior hysterectomy noted. Adnexal regions are unremarkable. No evidence of bowel obstruction. Large colonic stool burden again noted, but not significant changed.  IMPRESSION: Stable postoperative changes from Whipple procedure. No acute findings.  Stable hepatic steatosis and large colonic stool burden.   Electronically Signed   By: Myles Rosenthal M.D.   On: 10/30/2013 17:24    EKG Interpretation   None       MDM   1. Abdominal pain   2. Chronic pain   3. UTI (lower urinary tract infection)   4. Constipation   5. Nonspecific vaginitis    Recurrent pain, with clear evidence for chronic pain. She frequently uses narcotics for her pain. She has apparently been on pain management in the past, but is not currently on pain management. She understands that she likely needs to be in pain management,and the fact that the emergency department cnot always prescribe narcotics for chronic pain. There is no apparent serious bacterial illness, metabolic instability, or impending vascular collapse. CT done yesterday did not show acute intra-abdominal abnormalities. She has chronic constipation.  Nursing Notes Reviewed/ Care Coordinated Applicable Imaging Reviewed Interpretation of Laboratory Data incorporated into ED treatment  The  patient appears reasonably screened and/or stabilized for discharge and I doubt any other medical condition or other [facility] requiring further screening, evaluation, or treatment in the ED at this time prior to discharge.  Plan: Home Medications- Dilaudid 2 mg # 15; Home Treatments- rest, fluids; return here if the recommended treatment, does not improve the symptoms; Recommended follow up- PCP and GI prn. Pain Management asap    I personally performed the services described in this documentation, which was scribed in my presence. The recorded information has been reviewed and is accurate.    Flint Melter, MD 10/31/13 [phone]

## 2013-10-31 NOTE — ED Notes (Signed)
Pt's 02 sat increased to 94% after pt got dressed.  Pt awake and alert.

## 2013-10-31 NOTE — ED Notes (Signed)
Pt c/o upper and lower abdominal pain as well as n/v since Monday. Pt also reports hematuria and dysuria since Monday.

## 2013-10-31 NOTE — Discharge Instructions (Signed)
Abdominal Pain, Adult °Many things can cause abdominal pain. Usually, abdominal pain is not caused by a disease and will improve without treatment. It can often be observed and treated at home. Your health care provider will do a physical exam and possibly order blood tests and X-rays to help determine the seriousness of your pain. However, in many cases, more time must pass before a clear cause of the pain can be found. Before that point, your health care provider may not know if you need more testing or further treatment. °HOME CARE INSTRUCTIONS  °Monitor your abdominal pain for any changes. The following actions may help to alleviate any discomfort you are experiencing: °· Only take over-the-counter or prescription medicines as directed by your health care provider. °· Do not take laxatives unless directed to do so by your health care provider. °· Try a clear liquid diet (broth, tea, or water) as directed by your health care provider. Slowly move to a bland diet as tolerated. °SEEK MEDICAL CARE IF: °· You have unexplained abdominal pain. °· You have abdominal pain associated with nausea or diarrhea. °· You have pain when you urinate or have a bowel movement. °· You experience abdominal pain that wakes you in the night. °· You have abdominal pain that is worsened or improved by eating food. °· You have abdominal pain that is worsened with eating fatty foods. °SEEK IMMEDIATE MEDICAL CARE IF:  °· Your pain does not go away within 2 hours. °· You have a fever. °· You keep throwing up (vomiting). °· Your pain is felt only in portions of the abdomen, such as the right side or the left lower portion of the abdomen. °· You pass bloody or black tarry stools. °MAKE SURE YOU: °· Understand these instructions.   °· Will watch your condition.   °· Will get help right away if you are not doing well or get worse.   °Document Released: 07/04/2005 Document Revised: 07/15/2013 Document Reviewed: 06/03/2013 °ExitCare® Patient  Information ©2014 [employer]. ° °Chronic Pain °Chronic pain can be defined as pain that is off and on and lasts for 3 6 months or longer. Many things cause chronic pain, which can make it difficult to make a diagnosis. There are many treatment options available for chronic pain. However, finding a treatment that works well for you may require trying various approaches until the right one is found. Many people benefit from a combination of two or more types of treatment to control their pain. °SYMPTOMS  °Chronic pain can occur anywhere in the body and can range from mild to very severe. Some types of chronic pain include: °· Headache. °· Low back pain. °· Cancer pain. °· Arthritis pain. °· Neurogenic pain. This is pain resulting from damage to nerves. ° People with chronic pain may also have other symptoms such as: °· Depression. °· Anger. °· Insomnia. °· Anxiety. °DIAGNOSIS  °Your health care provider will help diagnose your condition over time. In many cases, the initial focus will be on excluding possible conditions that could be causing the pain. Depending on your symptoms, your health care provider may order tests to diagnose your condition. Some of these tests may include:  °· Blood tests.   °· CT scan.   °· MRI.   °· X-rays.   °· Ultrasounds.   °· Nerve conduction studies.   °You may need to see a specialist.  °TREATMENT  °Finding treatment that works well may take time. You may be referred to a pain specialist. He or she may prescribe medicine or therapies,   such as:   Mindful meditation or yoga.  Shots (injections) of numbing or pain-relieving medicines into the spine or area of pain.  Local electrical stimulation.  Acupuncture.   Massage therapy.   Aroma, color, light, or sound therapy.   Biofeedback.   Working with a physical therapist to keep from getting stiff.   Regular, gentle exercise.   Cognitive or behavioral therapy.   Group support.  Sometimes, surgery may be  recommended.  HOME CARE INSTRUCTIONS   Take all medicines as directed by your health care provider.   Lessen stress in your life by relaxing and doing things such as listening to calming music.   Exercise or be active as directed by your health care provider.   Eat a healthy diet and include things such as vegetables, fruits, fish, and lean meats in your diet.   Keep all follow-up appointments with your health care provider.   Attend a support group with others suffering from chronic pain. SEEK MEDICAL CARE IF:   Your pain gets worse.   You develop a new pain that was not there before.   You cannot tolerate medicines given to you by your health care provider.   You have new symptoms since your last visit with your health care provider.  SEEK IMMEDIATE MEDICAL CARE IF:   You feel weak.   You have decreased sensation or numbness.   You lose control of bowel or bladder function.   Your pain suddenly gets much worse.   You develop shaking.  You develop chills.  You develop confusion.  You develop chest pain.  You develop shortness of breath.  MAKE SURE YOU:  Understand these instructions.  Will watch your condition.  Will get help right away if you are not doing well or get worse. Document Released: 06/16/2002 Document Revised: 05/27/2013 Document Reviewed: 03/20/2013 [facility] Patient Information 2014 [place], [place].  Urinary Tract Infection Urinary tract infections (UTIs) can develop anywhere along your urinary tract. Your urinary tract is your body's drainage system for removing wastes and extra water. Your urinary tract includes two kidneys, two ureters, a bladder, and a urethra. Your kidneys are a pair of bean-shaped organs. Each kidney is about the size of your fist. They are located below your ribs, one on each side of your spine. CAUSES Infections are caused by microbes, which are microscopic organisms, including fungi, viruses, and bacteria.  These organisms are so small that they can only be seen through a microscope. Bacteria are the microbes that most commonly cause UTIs. SYMPTOMS  Symptoms of UTIs may vary by age and gender of the patient and by the location of the infection. Symptoms in young women typically include a frequent and intense urge to urinate and a painful, burning feeling in the bladder or urethra during urination. Older women and men are more likely to be tired, shaky, and weak and have muscle aches and abdominal pain. A fever may mean the infection is in your kidneys. Other symptoms of a kidney infection include pain in your back or sides below the ribs, nausea, and vomiting. DIAGNOSIS To diagnose a UTI, your caregiver will ask you about your symptoms. Your caregiver also will ask to provide a urine sample. The urine sample will be tested for bacteria and white blood cells. White blood cells are made by your body to help fight infection. TREATMENT  Typically, UTIs can be treated with medication. Because most UTIs are caused by a bacterial infection, they usually can be treated with  the use of antibiotics. The choice of antibiotic and length of treatment depend on your symptoms and the type of bacteria causing your infection. HOME CARE INSTRUCTIONS  If you were prescribed antibiotics, take them exactly as your caregiver instructs you. Finish the medication even if you feel better after you have only taken some of the medication.  Drink enough water and fluids to keep your urine clear or pale yellow.  Avoid caffeine, tea, and carbonated beverages. They tend to irritate your bladder.  Empty your bladder often. Avoid holding urine for long periods of time.  Empty your bladder before and after sexual intercourse.  After a bowel movement, women should cleanse from front to back. Use each tissue only once. SEEK MEDICAL CARE IF:   You have back pain.  You develop a fever.  Your symptoms do not begin to resolve within  3 days. SEEK IMMEDIATE MEDICAL CARE IF:   You have severe back pain or lower abdominal pain.  You develop chills.  You have nausea or vomiting.  You have continued burning or discomfort with urination. MAKE SURE YOU:   Understand these instructions.  Will watch your condition.  Will get help right away if you are not doing well or get worse. Document Released: 07/04/2005 Document Revised: 03/25/2012 Document Reviewed: 11/02/2011 [facility] Patient Information 2014 [place], [place].  Vaginitis Vaginitis is an inflammation of the vagina. It can happen when the normal bacteria and yeast in the vagina grow too much. There are different types. Treatment will depend on the type you have. HOME CARE  Take all medicines as told by your doctor.  Keep your vagina area clean and dry. Avoid soap. Rinse the area with water.  Avoid washing and cleaning out the vagina (douching).  Do not use tampons or have sex (intercourse) until your treatment is done.  Wipe from front to back after going to the restroom.  Wear cotton underwear.  Avoid wearing underwear while you sleep until your vaginitis is gone.  Avoid tight pants. Avoid underwear or nylons without a cotton panel.  Take off wet clothing (such as a bathing suit) as soon as you can.  Use mild, unscented products. Avoid fabric softeners and scented:  Feminine sprays.  Laundry detergents.  Tampons.  Soaps or bubble baths.  Practice safe sex and use condoms. GET HELP RIGHT AWAY IF:   You have belly (abdominal) pain.  You have a fever or lasting symptoms for more than 2 3 days.  You have a fever and your symptoms suddenly get worse. MAKE SURE YOU:   Understand these instructions.  Will watch this condition.  Will get help right away if you are not doing well or get worse. Document Released: 12/21/2008 Document Revised: 06/18/2012 Document Reviewed: 03/06/2012 [facility] Patient Information 2014 [place], [place].

## 2013-10-31 NOTE — ED Notes (Signed)
States "is hurting really bad". Informed physician would be with her shortly. Patient gave verbal understanding.

## 2013-10-31 NOTE — ED Notes (Signed)
Pateint requesting pain medication, MD aware and will be in to see patient.

## 2013-10-31 NOTE — ED Notes (Signed)
Patient wanting more pain medication. Md aware. No orders at present.

## 2013-11-01 ENCOUNTER — Emergency Department (HOSPITAL_COMMUNITY)
Admission: EM | Admit: 2013-11-01 | Discharge: 2013-11-02 | Disposition: A | Discharge status: Home / Self Care | Payer: Medicare Other | Attending: Emergency Medicine | Admitting: Emergency Medicine

## 2013-11-01 ENCOUNTER — Encounter (HOSPITAL_COMMUNITY): Payer: Self-pay | Admitting: Emergency Medicine

## 2013-11-01 DIAGNOSIS — Z8679 Personal history of other diseases of the circulatory system: Secondary | ICD-10-CM | POA: Diagnosis not present

## 2013-11-01 DIAGNOSIS — G8929 Other chronic pain: Secondary | ICD-10-CM | POA: Diagnosis not present

## 2013-11-01 DIAGNOSIS — F329 Major depressive disorder, single episode, unspecified: Secondary | ICD-10-CM | POA: Insufficient documentation

## 2013-11-01 DIAGNOSIS — F3289 Other specified depressive episodes: Secondary | ICD-10-CM | POA: Insufficient documentation

## 2013-11-01 DIAGNOSIS — Z87442 Personal history of urinary calculi: Secondary | ICD-10-CM | POA: Insufficient documentation

## 2013-11-01 DIAGNOSIS — Z9089 Acquired absence of other organs: Secondary | ICD-10-CM | POA: Diagnosis not present

## 2013-11-01 DIAGNOSIS — Z79899 Other long term (current) drug therapy: Secondary | ICD-10-CM | POA: Insufficient documentation

## 2013-11-01 DIAGNOSIS — R141 Gas pain: Secondary | ICD-10-CM | POA: Diagnosis not present

## 2013-11-01 DIAGNOSIS — R1084 Generalized abdominal pain: Secondary | ICD-10-CM | POA: Diagnosis not present

## 2013-11-01 DIAGNOSIS — R109 Unspecified abdominal pain: Secondary | ICD-10-CM

## 2013-11-01 DIAGNOSIS — F411 Generalized anxiety disorder: Secondary | ICD-10-CM | POA: Insufficient documentation

## 2013-11-01 DIAGNOSIS — F319 Bipolar disorder, unspecified: Secondary | ICD-10-CM | POA: Diagnosis not present

## 2013-11-01 DIAGNOSIS — Z90710 Acquired absence of both cervix and uterus: Secondary | ICD-10-CM | POA: Insufficient documentation

## 2013-11-01 DIAGNOSIS — K625 Hemorrhage of anus and rectum: Secondary | ICD-10-CM | POA: Insufficient documentation

## 2013-11-01 DIAGNOSIS — R142 Eructation: Secondary | ICD-10-CM | POA: Insufficient documentation

## 2013-11-01 DIAGNOSIS — R112 Nausea with vomiting, unspecified: Secondary | ICD-10-CM | POA: Diagnosis not present

## 2013-11-01 DIAGNOSIS — R143 Flatulence: Secondary | ICD-10-CM | POA: Diagnosis not present

## 2013-11-01 DIAGNOSIS — M549 Dorsalgia, unspecified: Secondary | ICD-10-CM | POA: Insufficient documentation

## 2013-11-01 DIAGNOSIS — R748 Abnormal levels of other serum enzymes: Secondary | ICD-10-CM | POA: Insufficient documentation

## 2013-11-01 DIAGNOSIS — E119 Type 2 diabetes mellitus without complications: Secondary | ICD-10-CM | POA: Diagnosis not present

## 2013-11-01 DIAGNOSIS — Z794 Long term (current) use of insulin: Secondary | ICD-10-CM | POA: Diagnosis not present

## 2013-11-01 LAB — COMPREHENSIVE METABOLIC PANEL
ALBUMIN: 3.8 g/dL (ref 3.5–5.2)
ALK PHOS: 122 U/L — AB (ref 39–117)
ALT: 69 U/L — ABNORMAL HIGH (ref 0–35)
AST: 33 U/L (ref 0–37)
BUN: 13 mg/dL (ref 6–23)
CO2: 25 mEq/L (ref 19–32)
Calcium: 9.4 mg/dL (ref 8.4–10.5)
Chloride: 101 mEq/L (ref 96–112)
Creatinine, Ser: 0.55 mg/dL (ref 0.50–1.10)
GFR calc Af Amer: >90 mL/min (ref >90)
GFR calc non Af Amer: >90 mL/min (ref >90)
Glucose, Bld: 112 mg/dL — ABNORMAL HIGH (ref 70–99)
POTASSIUM: 3.9 meq/L (ref 3.7–5.3)
SODIUM: 137 meq/L (ref 137–147)
Total Bilirubin: 0.4 mg/dL (ref 0.3–1.2)
Total Protein: 8 g/dL (ref 6.0–8.3)

## 2013-11-01 LAB — CBC WITH DIFFERENTIAL/PLATELET
BASOS ABS: 0 10*3/uL (ref 0.0–0.1)
Basophils Relative: 0 % (ref 0–1)
Eosinophils Absolute: 0 10*3/uL (ref 0.0–0.7)
Eosinophils Relative: 1 % (ref 0–5)
HCT: 45.4 % (ref 36.0–46.0)
Hemoglobin: 15.5 g/dL — ABNORMAL HIGH (ref 12.0–15.0)
LYMPHS ABS: 2.4 10*3/uL (ref 0.7–4.0)
Lymphocytes Relative: 42 % (ref 12–46)
MCH: 32.2 pg (ref 26.0–34.0)
MCHC: 34.1 g/dL (ref 30.0–36.0)
MCV: 94.4 fL (ref 78.0–100.0)
Monocytes Absolute: 0.3 10*3/uL (ref 0.1–1.0)
Monocytes Relative: 5 % (ref 3–12)
NEUTROS PCT: 53 % (ref 43–77)
Neutro Abs: 3 10*3/uL (ref 1.7–7.7)
PLATELETS: 165 10*3/uL (ref 150–400)
RBC: 4.81 MIL/uL (ref 3.87–5.11)
RDW: 13.1 % (ref 11.5–15.5)
WBC: 5.7 10*3/uL (ref 4.0–10.5)

## 2013-11-01 LAB — HIV ANTIBODY (ROUTINE TESTING W REFLEX): HIV: NONREACTIVE

## 2013-11-01 LAB — OCCULT BLOOD, POC DEVICE: Fecal Occult Bld: NEGATIVE

## 2013-11-01 MED ORDER — ONDANSETRON 8 MG PO TBDP
8.0000 mg | ORAL_TABLET | Freq: Once | ORAL | Status: AC
  Administered 2013-11-01: 8 mg via ORAL
  Filled 2013-11-01: qty 1

## 2013-11-01 MED ORDER — HYDROMORPHONE HCL PF 2 MG/ML IJ SOLN
2.0000 mg | Freq: Once | INTRAMUSCULAR | Status: AC
  Administered 2013-11-01: 2 mg via INTRAMUSCULAR
  Filled 2013-11-01: qty 1

## 2013-11-01 NOTE — ED Provider Notes (Addendum)
CSN: [id number]     Arrival date & time 11/01/13  2045 History   None    This chart was scribed for Dione Booze, MD by Arlan Organ, ED Scribe. This patient was seen in room APA09/APA09 and the patient's care was started 11:04 PM.   Chief Complaint  Patient presents with  . Abdominal Pain  . Rectal Bleeding   The history is provided by the patient. No language interpreter was used.    HPI Comments: [name] [name] is a 43 y.o. female who presents to the Emergency Department complaining of ongoing, constant, moderate upper abdominal pain with associated nausea and vomiting that initially started 6 days ago. Pt currently rates her pain 8/10. She also reports bright red rectal bleeding brought on when wiping after bowel movements, and mild back pain. She states after vomiting, her discomfort is not improved, and remains the same. Pt was seen in the ED 1/24 and 1/23 for similar complaints. She was treated with ED pain medications with improvement. She states using the bathroom and eating worsens her discomfort. She denies any alleviating factors. She denies fever, diaphoresis, or chills at this time. Pt has a surgical history of cholecystectomy and hysterectomy. She has a past medical history that includes DM, pancreatitis, kidney stones and bipolar disorder.  She is followed by Dr. Cliffton Asters- Gastroenterologist   Past Medical History  Diagnosis Date  . Pancreatitis   . Diabetes mellitus   . Esophageal ulcer 1997  . Depression   . Anxiety   . Migraine headache   . Bipolar disorder     bipolar was a misdiagnoses   . Glucose intolerance (impaired glucose tolerance)   . Chronic pancreatitis   . Kidney stones    Past Surgical History  Procedure Laterality Date  . Whipple procedure    . Intestinal blockage    . Abdominal surgery    . Abdominal hysterectomy    . Cholecystectomy    . Cesarean section    . Colonoscopy     History reviewed. No pertinent family history. History  Substance Use  Topics  . Smoking status: Never Smoker   . Smokeless tobacco: Not on file  . Alcohol Use: No   OB History   Grav Para Term Preterm Abortions TAB SAB Ect Mult Living                 Review of Systems  Constitutional: Negative for fever, chills and diaphoresis.  Gastrointestinal: Positive for nausea, vomiting and abdominal pain.       Rectal bleeding  Musculoskeletal: Positive for back pain.  All other systems reviewed and are negative.    Allergies  Buspar and Metoclopramide hcl  Home Medications   Current Outpatient Rx  Name  Route  Sig  Dispense  Refill  . dronabinol (MARINOL) 5 MG capsule   Oral   Take 5 mg by mouth 4 (four) times daily as needed (for appetite).          . DULoxetine (CYMBALTA) 60 MG capsule   Oral   Take 60 mg by mouth 2 (two) times daily.          . Eszopiclone (ESZOPICLONE) 3 MG TABS   Oral   Take 3 mg by mouth at bedtime. Take immediately before bedtime         . HYDROmorphone (DILAUDID) 2 MG tablet   Oral   Take 1 tablet (2 mg total) by mouth every 4 (four) hours as needed for severe pain (  pain).   15 tablet   0   . insulin glargine (LANTUS) 100 UNIT/ML injection   Subcutaneous   Inject 20 Units into the skin at bedtime.          . insulin lispro (HUMALOG) 100 UNIT/ML injection   Subcutaneous   Inject 0-9 Units into the skin 3 (three) times daily. 0-9 Units, Subcutaneous, 3 times daily with meals, CBG < 70: Drink or eat something sweet and recheck blood sugar. CBG 70 - 120: 0 units CBG 121 - 150: 1 unit CBG 151 - 200: 2 units CBG 201 - 250: 3 units CBG 251 - 300: 5 units CBG 301 - 350: 7 units CBG 351 - 400: 9 units CBG > 400: call MD          Triage Vitals: BP 110/60  Pulse 94  Temp(Src) 97.9 F (36.6 C) (Oral)  Resp 20  Ht 5\' 4"  (1.626 m)  Wt 124 lb (56.246 kg)  BMI 21.27 kg/m2  SpO2 100%  Physical Exam  Nursing note and vitals reviewed. Constitutional: She is oriented to person, place, and time. She appears  well-developed and well-nourished.  HENT:  Head: Normocephalic.  Eyes: EOM are normal.  Neck: Normal range of motion.  Cardiovascular: Normal rate and regular rhythm.   Pulmonary/Chest: Effort normal.  Abdominal: Soft. She exhibits distension. There is tenderness. There is no rebound and no guarding.  Mild distension  Mild to moderate tenderness diffusely   Genitourinary:  No hemorrhoids Soft stool Light brown stool No gross blood present   Musculoskeletal: Normal range of motion.  Neurological: She is alert and oriented to person, place, and time.  Psychiatric: She has a normal mood and affect.    ED Course  Procedures (including critical care time)  DIAGNOSTIC STUDIES: Oxygen Saturation is 100% on RA, Normal by my interpretation.    COORDINATION OF CARE: 11:22 PM- Will give Dilaudid and Zofran.Will order blood panel and occult blood stool sample. Discussed treatment plan with pt at bedside and pt agreed to plan.     Labs Review Results for orders placed during the hospital encounter of 11/01/13  CBC WITH DIFFERENTIAL      Result Value Range   WBC 5.7  4.0 - 10.5 K/uL   RBC 4.81  3.87 - 5.11 MIL/uL   Hemoglobin 15.5 (*) 12.0 - 15.0 g/dL   HCT 16.1  09.6 - 04.5 %   MCV 94.4  78.0 - 100.0 fL   MCH 32.2  26.0 - 34.0 pg   MCHC 34.1  30.0 - 36.0 g/dL   RDW 40.9  81.1 - 91.4 %   Platelets 165  150 - 400 K/uL   Neutrophils Relative % 53  43 - 77 %   Neutro Abs 3.0  1.7 - 7.7 K/uL   Lymphocytes Relative 42  12 - 46 %   Lymphs Abs 2.4  0.7 - 4.0 K/uL   Monocytes Relative 5  3 - 12 %   Monocytes Absolute 0.3  0.1 - 1.0 K/uL   Eosinophils Relative 1  0 - 5 %   Eosinophils Absolute 0.0  0.0 - 0.7 K/uL   Basophils Relative 0  0 - 1 %   Basophils Absolute 0.0  0.0 - 0.1 K/uL  COMPREHENSIVE METABOLIC PANEL      Result Value Range   Sodium 137  137 - 147 mEq/L   Potassium 3.9  3.7 - 5.3 mEq/L   Chloride 101  96 - 112 mEq/L  CO2 25  19 - 32 mEq/L   Glucose, Bld 112 (*) 70 -  99 mg/dL   BUN 13  6 - 23 mg/dL   Creatinine, Ser 1.61  0.50 - 1.10 mg/dL   Calcium 9.4  8.4 - 09.6 mg/dL   Total Protein 8.0  6.0 - 8.3 g/dL   Albumin 3.8  3.5 - 5.2 g/dL   AST 33  0 - 37 U/L   ALT 69 (*) 0 - 35 U/L   Alkaline Phosphatase 122 (*) 39 - 117 U/L   Total Bilirubin 0.4  0.3 - 1.2 mg/dL   GFR calc non Af Amer >90  >90 mL/min   GFR calc Af Amer >90  >90 mL/min  OCCULT BLOOD, POC DEVICE      Result Value Range   Fecal Occult Bld NEGATIVE  NEGATIVE   Ct Abdomen Pelvis W Contrast  10/30/2013   CLINICAL DATA:  Lower abdominal pain. Pancreatitis. Previous Whipple procedure and hysterectomy.  EXAM: CT ABDOMEN AND PELVIS WITH CONTRAST  TECHNIQUE: Multidetector CT imaging of the abdomen and pelvis was performed using the standard protocol following bolus administration of intravenous contrast.  CONTRAST:  OMNIPAQUE IOHEXOL 300 MG/ML  SOLN  COMPARISON:  10/13/2012  FINDINGS: Postop changes from prior Whipple procedure are stable in appearance. Multiple small less than 1 cm peripancreatic and central mesenteric lymph nodes remain stable. No pathologically enlarged nodes or other masses identified within the abdomen or pelvis. Mild calcification and pancreatic ductal dilatation in the residual portion the pancreatic tail is stable in appearance.  Hepatic steatosis is stable, and no liver masses are identified. Gallbladder is unremarkable. Mild pneumobilia again noted. The spleen, adrenal glands, and kidneys are normal in appearance. No evidence of hydronephrosis.  No evidence of acute inflammatory process or abnormal fluid collections within the abdomen or pelvis. Prior hysterectomy noted. Adnexal regions are unremarkable. No evidence of bowel obstruction. Large colonic stool burden again noted, but not significant changed.  IMPRESSION: Stable postoperative changes from Whipple procedure. No acute findings.  Stable hepatic steatosis and large colonic stool burden.   Electronically Signed   By:  Myles Rosenthal M.D.   On: 10/30/2013 17:24     MDM   1. Chronic abdominal pain   2. Elevated liver enzymes    Patient with chronic abdominal pain and ongoing symptoms. Old records are reviewed and she has been seen twice in the ED in the last 2 days and had a CT scan done 2 days ago which was unremarkable. Laboratory testing today is unchanged from baseline. In spite of report of rectal bleeding, there is no blood seen on rectal exam and orthostatic vital signs show no abnormal rise in pulse or drop in blood pressure. Patient went out of her weight is stated she was not requesting prescription for pain medication, although it is noted that she did get a prescription for hydromorphone yesterday. She is specifically requesting an injection of hydromorphone and promethazine. She is given a single dose of hydromorphone but is given ondansetron oral disintegrating tablet for nausea. She is discharged with instructions to followup with her gastroenterologist at [facility].  I personally performed the services described in this documentation, which was scribed in my presence. The recorded information has been reviewed and is accurate.   Dione Booze, MD 11/02/13 0025  As patient was being discharged, she claimed that she was having severe anxiety and needs to go to [facility]. Patient advised that she should contact Behavioral Health  in the morning for outpatient visit. She then requested additional pain medication, but was told she would not get any additional pain medication here tonight.  Dione Boozeavid Samantha Olivera, MD 11/02/13 [id number]

## 2013-11-01 NOTE — ED Notes (Signed)
Patient complaining of abdominal x 1 week and rectal bleeding starting today.

## 2013-11-02 ENCOUNTER — Emergency Department (HOSPITAL_COMMUNITY)
Admission: EM | Admit: 2013-11-02 | Discharge: 2013-11-02 | Disposition: A | Discharge status: Home / Self Care | Payer: Medicare Other | Attending: Emergency Medicine | Admitting: Emergency Medicine

## 2013-11-02 ENCOUNTER — Encounter (HOSPITAL_COMMUNITY): Payer: Self-pay | Admitting: Emergency Medicine

## 2013-11-02 DIAGNOSIS — Z8679 Personal history of other diseases of the circulatory system: Secondary | ICD-10-CM | POA: Diagnosis not present

## 2013-11-02 DIAGNOSIS — I498 Other specified cardiac arrhythmias: Secondary | ICD-10-CM | POA: Diagnosis not present

## 2013-11-02 DIAGNOSIS — Z90710 Acquired absence of both cervix and uterus: Secondary | ICD-10-CM | POA: Diagnosis not present

## 2013-11-02 DIAGNOSIS — F329 Major depressive disorder, single episode, unspecified: Secondary | ICD-10-CM | POA: Diagnosis not present

## 2013-11-02 DIAGNOSIS — Z9089 Acquired absence of other organs: Secondary | ICD-10-CM | POA: Diagnosis not present

## 2013-11-02 DIAGNOSIS — Z794 Long term (current) use of insulin: Secondary | ICD-10-CM | POA: Diagnosis not present

## 2013-11-02 DIAGNOSIS — R112 Nausea with vomiting, unspecified: Secondary | ICD-10-CM | POA: Insufficient documentation

## 2013-11-02 DIAGNOSIS — F411 Generalized anxiety disorder: Secondary | ICD-10-CM | POA: Diagnosis not present

## 2013-11-02 DIAGNOSIS — N390 Urinary tract infection, site not specified: Secondary | ICD-10-CM | POA: Insufficient documentation

## 2013-11-02 DIAGNOSIS — R1084 Generalized abdominal pain: Secondary | ICD-10-CM | POA: Diagnosis not present

## 2013-11-02 DIAGNOSIS — Z9889 Other specified postprocedural states: Secondary | ICD-10-CM | POA: Insufficient documentation

## 2013-11-02 DIAGNOSIS — F319 Bipolar disorder, unspecified: Secondary | ICD-10-CM | POA: Insufficient documentation

## 2013-11-02 DIAGNOSIS — G8929 Other chronic pain: Secondary | ICD-10-CM | POA: Insufficient documentation

## 2013-11-02 DIAGNOSIS — E119 Type 2 diabetes mellitus without complications: Secondary | ICD-10-CM | POA: Diagnosis not present

## 2013-11-02 DIAGNOSIS — Z87442 Personal history of urinary calculi: Secondary | ICD-10-CM | POA: Insufficient documentation

## 2013-11-02 DIAGNOSIS — R109 Unspecified abdominal pain: Secondary | ICD-10-CM

## 2013-11-02 DIAGNOSIS — J069 Acute upper respiratory infection, unspecified: Secondary | ICD-10-CM | POA: Insufficient documentation

## 2013-11-02 DIAGNOSIS — K5900 Constipation, unspecified: Secondary | ICD-10-CM | POA: Diagnosis not present

## 2013-11-02 DIAGNOSIS — Z79899 Other long term (current) drug therapy: Secondary | ICD-10-CM | POA: Insufficient documentation

## 2013-11-02 DIAGNOSIS — F3289 Other specified depressive episodes: Secondary | ICD-10-CM | POA: Insufficient documentation

## 2013-11-02 DIAGNOSIS — Z3202 Encounter for pregnancy test, result negative: Secondary | ICD-10-CM | POA: Diagnosis not present

## 2013-11-02 HISTORY — DX: Other chronic pain: G89.29

## 2013-11-02 LAB — URINE CULTURE: Colony Count: >=100000

## 2013-11-02 LAB — URINALYSIS, ROUTINE W REFLEX MICROSCOPIC
Bilirubin Urine: NEGATIVE
Glucose, UA: 500 mg/dL — AB
Ketones, ur: NEGATIVE mg/dL
Leukocytes, UA: NEGATIVE
NITRITE: POSITIVE — AB
Protein, ur: NEGATIVE mg/dL
SPECIFIC GRAVITY, URINE: 1.021 (ref 1.005–1.030)
UROBILINOGEN UA: 0.2 mg/dL (ref 0.0–1.0)
pH: 5 (ref 5.0–8.0)

## 2013-11-02 LAB — COMPREHENSIVE METABOLIC PANEL
ALT: 53 U/L — ABNORMAL HIGH (ref 0–35)
AST: 34 U/L (ref 0–37)
Albumin: 3.2 g/dL — ABNORMAL LOW (ref 3.5–5.2)
Alkaline Phosphatase: 102 U/L (ref 39–117)
BUN: 12 mg/dL (ref 6–23)
CALCIUM: 8 mg/dL — AB (ref 8.4–10.5)
CO2: 21 mEq/L (ref 19–32)
Chloride: 101 mEq/L (ref 96–112)
Creatinine, Ser: 0.42 mg/dL — ABNORMAL LOW (ref 0.50–1.10)
GFR calc Af Amer: >90 mL/min (ref >90)
GFR calc non Af Amer: >90 mL/min (ref >90)
Glucose, Bld: 269 mg/dL — ABNORMAL HIGH (ref 70–99)
Potassium: 4.4 mEq/L (ref 3.7–5.3)
SODIUM: 136 meq/L — AB (ref 137–147)
TOTAL PROTEIN: 6.5 g/dL (ref 6.0–8.3)
Total Bilirubin: 0.4 mg/dL (ref 0.3–1.2)

## 2013-11-02 LAB — CBC WITH DIFFERENTIAL/PLATELET
BASOS ABS: 0 10*3/uL (ref 0.0–0.1)
BASOS PCT: 0 % (ref 0–1)
EOS ABS: 0 10*3/uL (ref 0.0–0.7)
Eosinophils Relative: 1 % (ref 0–5)
HCT: 38.3 % (ref 36.0–46.0)
HEMOGLOBIN: 13.5 g/dL (ref 12.0–15.0)
Lymphocytes Relative: 37 % (ref 12–46)
Lymphs Abs: 1.8 10*3/uL (ref 0.7–4.0)
MCH: 33.1 pg (ref 26.0–34.0)
MCHC: 35.2 g/dL (ref 30.0–36.0)
MCV: 93.9 fL (ref 78.0–100.0)
MONO ABS: 0.4 10*3/uL (ref 0.1–1.0)
Monocytes Relative: 9 % (ref 3–12)
NEUTROS PCT: 53 % (ref 43–77)
Neutro Abs: 2.6 10*3/uL (ref 1.7–7.7)
PLATELETS: ADEQUATE 10*3/uL (ref 150–400)
RBC: 4.08 MIL/uL (ref 3.87–5.11)
RDW: 13.3 % (ref 11.5–15.5)
Smear Review: ADEQUATE
WBC: 4.8 10*3/uL (ref 4.0–10.5)

## 2013-11-02 LAB — URINE MICROSCOPIC-ADD ON

## 2013-11-02 LAB — POCT PREGNANCY, URINE: PREG TEST UR: NEGATIVE

## 2013-11-02 LAB — GC/CHLAMYDIA PROBE AMP
CT Probe RNA: NEGATIVE
GC Probe RNA: NEGATIVE

## 2013-11-02 LAB — LIPASE, BLOOD: Lipase: 10 U/L — ABNORMAL LOW (ref 11–59)

## 2013-11-02 MED ORDER — FENTANYL CITRATE 0.05 MG/ML IJ SOLN
50.0000 ug | Freq: Once | INTRAMUSCULAR | Status: AC
  Administered 2013-11-02: 50 ug via INTRAVENOUS
  Filled 2013-11-02: qty 2

## 2013-11-02 MED ORDER — ONDANSETRON HCL 4 MG/2ML IJ SOLN
4.0000 mg | Freq: Once | INTRAMUSCULAR | Status: AC
  Administered 2013-11-02: 4 mg via INTRAVENOUS
  Filled 2013-11-02: qty 2

## 2013-11-02 MED ORDER — SODIUM CHLORIDE 0.9 % IV BOLUS (SEPSIS)
1000.0000 mL | Freq: Once | INTRAVENOUS | Status: AC
  Administered 2013-11-02: 1000 mL via INTRAVENOUS

## 2013-11-02 MED ORDER — SODIUM CHLORIDE 0.9 % IV SOLN
INTRAVENOUS | Status: DC
  Administered 2013-11-02: 11:00:00 via INTRAVENOUS

## 2013-11-02 MED ORDER — PHENYLEPHRINE-CHLORPHEN-DM 12.5-4-15 MG/5ML PO SYRP: 5.0000 mL | ORAL_SOLUTION | ORAL | Status: DC

## 2013-11-02 NOTE — Progress Notes (Signed)
Met patient at bedside.Role of CM explained.Patient reports today's ED is secondary to abdominal pain.This CM asked patient about her frequent ED visits .Patient reports her PCP did not address all  Her concerns,CM educated patient that she can utilize a Science writer through [employer].Patient educated on importance of following up with her PCP and her Gastroenterologist.Patient educated on Importance of reserving ED for medical emergencies ,patient reports she will follow up with her PCP.No further CM needs identified.Social worker at bedside and will provide resources.

## 2013-11-02 NOTE — Discharge Instructions (Signed)
Abdominal Pain, Adult °Many things can cause abdominal pain. Usually, abdominal pain is not caused by a disease and will improve without treatment. It can often be observed and treated at home. Your health care provider will do a physical exam and possibly order blood tests and X-rays to help determine the seriousness of your pain. However, in many cases, more time must pass before a clear cause of the pain can be found. Before that point, your health care provider may not know if you need more testing or further treatment. °HOME CARE INSTRUCTIONS  °Monitor your abdominal pain for any changes. The following actions may help to alleviate any discomfort you are experiencing: °· Only take over-the-counter or prescription medicines as directed by your health care provider. °· Do not take laxatives unless directed to do so by your health care provider. °· Try a clear liquid diet (broth, tea, or water) as directed by your health care provider. Slowly move to a bland diet as tolerated. °SEEK MEDICAL CARE IF: °· You have unexplained abdominal pain. °· You have abdominal pain associated with nausea or diarrhea. °· You have pain when you urinate or have a bowel movement. °· You experience abdominal pain that wakes you in the night. °· You have abdominal pain that is worsened or improved by eating food. °· You have abdominal pain that is worsened with eating fatty foods. °SEEK IMMEDIATE MEDICAL CARE IF:  °· Your pain does not go away within 2 hours. °· You have a fever. °· You keep throwing up (vomiting). °· Your pain is felt only in portions of the abdomen, such as the right side or the left lower portion of the abdomen. °· You pass bloody or black tarry stools. °MAKE SURE YOU: °· Understand these instructions.   °· Will watch your condition.   °· Will get help right away if you are not doing well or get worse.   °Document Released: 07/04/2005 Document Revised: 07/15/2013 Document Reviewed: 06/03/2013 °ExitCare® Patient  Information ©2014 [employer]. ° °

## 2013-11-02 NOTE — Discharge Instructions (Signed)
Abdominal Pain, Women °Abdominal (stomach, pelvic, or belly) pain can be caused by many things. It is important to tell your doctor: °· The location of the pain. °· Does it come and go or is it present all the time? °· Are there things that start the pain (eating certain foods, exercise)? °· Are there other symptoms associated with the pain (fever, nausea, vomiting, diarrhea)? °All of this is helpful to know when trying to find the cause of the pain. °CAUSES  °· Stomach: virus or bacteria infection, or ulcer. °· Intestine: appendicitis (inflamed appendix), regional ileitis (Crohn's disease), ulcerative colitis (inflamed colon), irritable bowel syndrome, diverticulitis (inflamed diverticulum of the colon), or cancer of the stomach or intestine. °· Gallbladder disease or stones in the gallbladder. °· Kidney disease, kidney stones, or infection. °· Pancreas infection or cancer. °· Fibromyalgia (pain disorder). °· Diseases of the female organs: °· Uterus: fibroid (non-cancerous) tumors or infection. °· Fallopian tubes: infection or tubal pregnancy. °· Ovary: cysts or tumors. °· Pelvic adhesions (scar tissue). °· Endometriosis (uterus lining tissue growing in the pelvis and on the pelvic organs). °· Pelvic congestion syndrome (female organs filling up with blood just before the menstrual period). °· Pain with the menstrual period. °· Pain with ovulation (producing an egg). °· Pain with an IUD (intrauterine device, birth control) in the uterus. °· Cancer of the female organs. °· Functional pain (pain not caused by a disease, may improve without treatment). °· Psychological pain. °· Depression. °DIAGNOSIS  °Your doctor will decide the seriousness of your pain by doing an examination. °· Blood tests. °· X-rays. °· Ultrasound. °· CT scan (computed tomography, special type of X-ray). °· MRI (magnetic resonance imaging). °· Cultures, for infection. °· Barium enema (dye inserted in the large intestine, to better view it with  X-rays). °· Colonoscopy (looking in intestine with a lighted tube). °· Laparoscopy (minor surgery, looking in abdomen with a lighted tube). °· Major abdominal exploratory surgery (looking in abdomen with a large incision). °TREATMENT  °The treatment will depend on the cause of the pain.  °· Many cases can be observed and treated at home. °· Over-the-counter medicines recommended by your caregiver. °· Prescription medicine. °· Antibiotics, for infection. °· Birth control pills, for painful periods or for ovulation pain. °· Hormone treatment, for endometriosis. °· Nerve blocking injections. °· Physical therapy. °· Antidepressants. °· Counseling with a psychologist or psychiatrist. °· Minor or major surgery. °HOME CARE INSTRUCTIONS  °· Do not take laxatives, unless directed by your caregiver. °· Take over-the-counter pain medicine only if ordered by your caregiver. Do not take aspirin because it can cause an upset stomach or bleeding. °· Try a clear liquid diet (broth or water) as ordered by your caregiver. Slowly move to a bland diet, as tolerated, if the pain is related to the stomach or intestine. °· Have a thermometer and take your temperature several times a day, and record it. °· Bed rest and sleep, if it helps the pain. °· Avoid sexual intercourse, if it causes pain. °· Avoid stressful situations. °· Keep your follow-up appointments and tests, as your caregiver orders. °· If the pain does not go away with medicine or surgery, you may try: °· Acupuncture. °· Relaxation exercises (yoga, meditation). °· Group therapy. °· Counseling. °SEEK MEDICAL CARE IF:  °· You notice certain foods cause stomach pain. °· Your home care treatment is not helping your pain. °· You need stronger pain medicine. °· You want your IUD removed. °· You feel faint or   lightheaded. °· You develop nausea and vomiting. °· You develop a rash. °· You are having side effects or an allergy to your medicine. °SEEK IMMEDIATE MEDICAL CARE IF:  °· Your  pain does not go away or gets worse. °· You have a fever. °· Your pain is felt only in portions of the abdomen. The right side could possibly be appendicitis. The left lower portion of the abdomen could be colitis or diverticulitis. °· You are passing blood in your stools (bright red or black tarry stools, with or without vomiting). °· You have blood in your urine. °· You develop chills, with or without a fever. °· You pass out. °MAKE SURE YOU:  °· Understand these instructions. °· Will watch your condition. °· Will get help right away if you are not doing well or get worse. °Document Released: 07/22/2007 Document Revised: 12/17/2011 Document Reviewed: 08/11/2009 °ExitCare® Patient Information ©2014 [employer]. ° °Chronic Pain °Chronic pain can be defined as pain that is off and on and lasts for 3 6 months or longer. Many things cause chronic pain, which can make it difficult to make a diagnosis. There are many treatment options available for chronic pain. However, finding a treatment that works well for you may require trying various approaches until the right one is found. Many people benefit from a combination of two or more types of treatment to control their pain. °SYMPTOMS  °Chronic pain can occur anywhere in the body and can range from mild to very severe. Some types of chronic pain include: °· Headache. °· Low back pain. °· Cancer pain. °· Arthritis pain. °· Neurogenic pain. This is pain resulting from damage to nerves. ° People with chronic pain may also have other symptoms such as: °· Depression. °· Anger. °· Insomnia. °· Anxiety. °DIAGNOSIS  °Your health care provider will help diagnose your condition over time. In many cases, the initial focus will be on excluding possible conditions that could be causing the pain. Depending on your symptoms, your health care provider may order tests to diagnose your condition. Some of these tests may include:  °· Blood tests.   °· CT scan.   °· MRI.   °· X-rays.    °· Ultrasounds.   °· Nerve conduction studies.   °You may need to see a specialist.  °TREATMENT  °Finding treatment that works well may take time. You may be referred to a pain specialist. He or she may prescribe medicine or therapies, such as:  °· Mindful meditation or yoga. °· Shots (injections) of numbing or pain-relieving medicines into the spine or area of pain. °· Local electrical stimulation. °· Acupuncture.   °· Massage therapy.   °· Aroma, color, light, or sound therapy.   °· Biofeedback.   °· Working with a physical therapist to keep from getting stiff.   °· Regular, gentle exercise.   °· Cognitive or behavioral therapy.   °· Group support.   °Sometimes, surgery may be recommended.  °HOME CARE INSTRUCTIONS  °· Take all medicines as directed by your health care provider.   °· Lessen stress in your life by relaxing and doing things such as listening to calming music.   °· Exercise or be active as directed by your health care provider.   °· Eat a healthy diet and include things such as vegetables, fruits, fish, and lean meats in your diet.   °· Keep all follow-up appointments with your health care provider.   °· Attend a support group with others suffering from chronic pain. °SEEK MEDICAL CARE IF:  °· Your pain gets worse.   °· You develop a new pain   that was not there before.   You cannot tolerate medicines given to you by your health care provider.   You have new symptoms since your last visit with your health care provider.  SEEK IMMEDIATE MEDICAL CARE IF:   You feel weak.   You have decreased sensation or numbness.   You lose control of bowel or bladder function.   Your pain suddenly gets much worse.   You develop shaking.  You develop chills.  You develop confusion.  You develop chest pain.  You develop shortness of breath.  MAKE SURE YOU:  Understand these instructions.  Will watch your condition.  Will get help right away if you are not doing well or get  worse. Document Released: 06/16/2002 Document Revised: 05/27/2013 Document Reviewed: 03/20/2013 [facility] Patient Information 2014 [place], [place].  Upper Respiratory Infection, Adult An upper respiratory infection (URI) is also known as the common cold. It is often caused by a type of germ (virus). Colds are easily spread (contagious). You can pass it to others by kissing, coughing, sneezing, or drinking out of the same glass. Usually, you get better in 1 or 2 weeks.  HOME CARE   Only take medicine as told by your doctor.  Use a warm mist humidifier or breathe in steam from a hot shower.  Drink enough water and fluids to keep your pee (urine) clear or pale yellow.  Get plenty of rest.  Return to work when your temperature is back to normal or as told by your doctor. You may use a face mask and wash your hands to stop your cold from spreading. GET HELP RIGHT AWAY IF:   After the first few days, you feel you are getting worse.  You have questions about your medicine.  You have chills, shortness of breath, or brown or red spit (mucus).  You have yellow or brown snot (nasal discharge) or pain in the face, especially when you bend forward.  You have a fever, puffy (swollen) neck, pain when you swallow, or white spots in the back of your throat.  You have a bad headache, ear pain, sinus pain, or chest pain.  You have a high-pitched whistling sound when you breathe in and out (wheezing).  You have a lasting cough or cough up blood.  You have sore muscles or a stiff neck. MAKE SURE YOU:   Understand these instructions.  Will watch your condition.  Will get help right away if you are not doing well or get worse. Document Released: 03/12/2008 Document Revised: 12/17/2011 Document Reviewed: 01/29/2011 [facility] Patient Information 2014 [place], [place].  Urinary Tract Infection Urinary tract infections (UTIs) can develop anywhere along your urinary tract. Your urinary tract is your  body's drainage system for removing wastes and extra water. Your urinary tract includes two kidneys, two ureters, a bladder, and a urethra. Your kidneys are a pair of bean-shaped organs. Each kidney is about the size of your fist. They are located below your ribs, one on each side of your spine. CAUSES Infections are caused by microbes, which are microscopic organisms, including fungi, viruses, and bacteria. These organisms are so small that they can only be seen through a microscope. Bacteria are the microbes that most commonly cause UTIs. SYMPTOMS  Symptoms of UTIs may vary by age and gender of the patient and by the location of the infection. Symptoms in young women typically include a frequent and intense urge to urinate and a painful, burning feeling in the bladder or urethra during  urination. Older women and men are more likely to be tired, shaky, and weak and have muscle aches and abdominal pain. A fever may mean the infection is in your kidneys. Other symptoms of a kidney infection include pain in your back or sides below the ribs, nausea, and vomiting. DIAGNOSIS To diagnose a UTI, your caregiver will ask you about your symptoms. Your caregiver also will ask to provide a urine sample. The urine sample will be tested for bacteria and white blood cells. White blood cells are made by your body to help fight infection. TREATMENT  Typically, UTIs can be treated with medication. Because most UTIs are caused by a bacterial infection, they usually can be treated with the use of antibiotics. The choice of antibiotic and length of treatment depend on your symptoms and the type of bacteria causing your infection. HOME CARE INSTRUCTIONS  If you were prescribed antibiotics, take them exactly as your caregiver instructs you. Finish the medication even if you feel better after you have only taken some of the medication.  Drink enough water and fluids to keep your urine clear or pale yellow.  Avoid caffeine,  tea, and carbonated beverages. They tend to irritate your bladder.  Empty your bladder often. Avoid holding urine for long periods of time.  Empty your bladder before and after sexual intercourse.  After a bowel movement, women should cleanse from front to back. Use each tissue only once. SEEK MEDICAL CARE IF:   You have back pain.  You develop a fever.  Your symptoms do not begin to resolve within 3 days. SEEK IMMEDIATE MEDICAL CARE IF:   You have severe back pain or lower abdominal pain.  You develop chills.  You have nausea or vomiting.  You have continued burning or discomfort with urination. MAKE SURE YOU:   Understand these instructions.  Will watch your condition.  Will get help right away if you are not doing well or get worse. Document Released: 07/04/2005 Document Revised: 03/25/2012 Document Reviewed: 11/02/2011 [facility] Patient Information 2014 [place].

## 2013-11-02 NOTE — ED Provider Notes (Signed)
CSN: [id number]     Arrival date & time 11/02/13  [id number] History   First MD Initiated Contact with Patient 11/02/13 [phone]     Chief Complaint  Patient presents with  . Abdominal Pain  . Back Pain   (Consider location/radiation/quality/duration/timing/severity/associated sxs/prior Treatment) HPI Comments: Patient is a 43 year old female with history of chronic abdominal pain, pancreatitis, diabetes who presents today for her abdominal pain. She is having generalized abdominal pain which radiates into her back. The pain is sharp and crampy. This feels like her normal, chronic pain. She has been evaluated on 1/23, 1/24, 1/25 for this same pain. She has associated nausea and vomiting. Her emesis is nonbloody. She denies any diarrhea. She denies any abnormal or suspicious food intake, fever, chills. She was diagnosed with a UTI yesterday and was given antibiotics. She has not filled this prescription yet.   Patient is a 43 y.o. female presenting with abdominal pain and back pain. The history is provided by the patient. No language interpreter was used.  Abdominal Pain Associated symptoms: nausea and vomiting   Associated symptoms: no chest pain, no chills, no diarrhea, no fever and no shortness of breath   Back Pain Associated symptoms: abdominal pain   Associated symptoms: no chest pain and no fever     Past Medical History  Diagnosis Date  . Pancreatitis   . Diabetes mellitus   . Esophageal ulcer 1997  . Depression   . Anxiety   . Migraine headache   . Bipolar disorder     bipolar was a misdiagnoses   . Glucose intolerance (impaired glucose tolerance)   . Chronic pancreatitis   . Kidney stones   . Chronic abdominal pain   . Chronic pain     has pain specialist  . Chronic constipation   . Fatty liver    Past Surgical History  Procedure Laterality Date  . Whipple procedure    . Intestinal blockage    . Abdominal surgery    . Abdominal hysterectomy    . Cholecystectomy    .  Cesarean section    . Colonoscopy     No family history on file. History  Substance Use Topics  . Smoking status: Never Smoker   . Smokeless tobacco: Not on file  . Alcohol Use: No   OB History   Grav Para Term Preterm Abortions TAB SAB Ect Mult Living                 Review of Systems  Constitutional: Negative for fever and chills.  Respiratory: Negative for shortness of breath.   Cardiovascular: Negative for chest pain.  Gastrointestinal: Positive for nausea, vomiting and abdominal pain. Negative for diarrhea.  Musculoskeletal: Positive for back pain.  All other systems reviewed and are negative.    Allergies  Buspar and Metoclopramide hcl  Home Medications   Current Outpatient Rx  Name  Route  Sig  Dispense  Refill  . dronabinol (MARINOL) 5 MG capsule   Oral   Take 5 mg by mouth 4 (four) times daily as needed (for appetite).          . DULoxetine (CYMBALTA) 60 MG capsule   Oral   Take 60 mg by mouth 2 (two) times daily.          . Eszopiclone (ESZOPICLONE) 3 MG TABS   Oral   Take 3 mg by mouth at bedtime. lunesta Take immediately before bedtime         . HYDROmorphone (  DILAUDID) 2 MG tablet   Oral   Take 1 tablet (2 mg total) by mouth every 4 (four) hours as needed for severe pain (pain).   15 tablet   0   . insulin glargine (LANTUS) 100 UNIT/ML injection   Subcutaneous   Inject 20 Units into the skin at bedtime.          . insulin lispro (HUMALOG) 100 UNIT/ML injection   Subcutaneous   Inject 0-9 Units into the skin 3 (three) times daily. 0-9 Units, Subcutaneous, 3 times daily with meals, CBG < 70: Drink or eat something sweet and recheck blood sugar. CBG 70 - 120: 0 units CBG 121 - 150: 1 unit CBG 151 - 200: 2 units CBG 201 - 250: 3 units CBG 251 - 300: 5 units CBG 301 - 350: 7 units CBG 351 - 400: 9 units CBG > 400: call MD         . omeprazole (PRILOSEC) 20 MG capsule   Oral   Take 20 mg by mouth every other day as needed (for  stomach).         . polyethylene glycol (MIRALAX / GLYCOLAX) packet   Oral   Take 17 g by mouth daily as needed for mild constipation.          BP 103/69  Pulse 86  Temp(Src) 98.7 F (37.1 C) (Oral)  Resp 24  Ht 5\' 3"  (1.6 m)  Wt 124 lb (56.246 kg)  BMI 21.97 kg/m2  SpO2 95% Physical Exam  Nursing note and vitals reviewed. Constitutional: She is oriented to person, place, and time. She appears well-developed and well-nourished. No distress.  Patient appears comfortable, laying in stretcher. No acute distress.  HENT:  Head: Normocephalic and atraumatic.  Right Ear: External ear normal.  Left Ear: External ear normal.  Nose: Nose normal.  Mouth/Throat: Oropharynx is clear and moist.  Eyes: Conjunctivae are normal.  Neck: Normal range of motion.  Cardiovascular: Normal rate, regular rhythm and normal heart sounds.   Pulmonary/Chest: Effort normal and breath sounds normal. No stridor. No respiratory distress. She has no wheezes. She has no rales.  Abdominal: Soft. She exhibits no distension. There is generalized tenderness. There is no rigidity and no guarding.  Musculoskeletal: Normal range of motion.  Neurological: She is alert and oriented to person, place, and time. She has normal strength.  Skin: Skin is warm and dry. She is not diaphoretic. No erythema.  Psychiatric: She has a normal mood and affect. Her behavior is normal.    ED Course  Procedures (including critical care time) Labs Review Labs Reviewed  COMPREHENSIVE METABOLIC PANEL - Abnormal; Notable for the following:    Sodium 136 (*)    Glucose, Bld 269 (*)    Creatinine, Ser 0.42 (*)    Calcium 8.0 (*)    Albumin 3.2 (*)    ALT 53 (*)    All other components within normal limits  LIPASE, BLOOD - Abnormal; Notable for the following:    Lipase 10 (*)    All other components within normal limits  URINALYSIS, ROUTINE W REFLEX MICROSCOPIC - Abnormal; Notable for the following:    APPearance CLOUDY (*)     Glucose, UA 500 (*)    Hgb urine dipstick MODERATE (*)    Nitrite POSITIVE (*)    All other components within normal limits  URINE MICROSCOPIC-ADD ON - Abnormal; Notable for the following:    Squamous Epithelial / LPF MANY (*)  Bacteria, UA MANY (*)    All other components within normal limits  URINE CULTURE  CBC WITH DIFFERENTIAL  POCT PREGNANCY, URINE   Imaging Review No results found.  EKG Interpretation    Date/Time:  Monday November 02 2013 08:53:48 EST Ventricular Rate:  102 PR Interval:  138 QRS Duration: 82 QT Interval:  354 QTC Calculation: 461 R Axis:   95 Text Interpretation:  Sinus tachycardia Baseline wander Artifact Rightward axis Borderline ECG When compared with ECG of 10/29/2010 No significant change was found Confirmed by [facility]  MD, KATHLEEN ([id number]) on 11/02/2013 9:05:40 AM            MDM   1. Chronic abdominal pain   2. UTI (lower urinary tract infection)   3. URI (upper respiratory infection)    Patient presents to ED with chronic abdominal pain. She has been seen every day since 1/23. Labs appear at baseline. Patient had unremarkable CT scan on 1/23. Patient tolerated PO challenge well. Encouraged to fill rx for UTI. Patient is to follow up with her GI physician. Return instructions given. Vital signs stable for discharge. Discussed case with Dr. Clarene Duke who agrees with plan. Patient / Family / Caregiver informed of clinical course, understand medical decision-making process, and agree with plan.     Mora Bellman, PA-C 11/02/13 1507

## 2013-11-02 NOTE — ED Notes (Signed)
Social workers at bedside

## 2013-11-02 NOTE — ED Notes (Signed)
Pt reporting LUQ abdominal pain and generalized back pain since Monday. Reports nausea and vomiting but not today. Denies diarrhea. Pt is a x 4.

## 2013-11-02 NOTE — ED Notes (Signed)
IV Team at bedside 

## 2013-11-02 NOTE — ED Notes (Signed)
Pt able to drink Sprite, no emesis. Pt continues to report chronic pain. PA made aware.

## 2013-11-02 NOTE — ED Notes (Signed)
Social worker at bedside.

## 2013-11-02 NOTE — Progress Notes (Signed)
CSW consult to pt for anxiety and frequent ED visits. CSW shared with pt information regarding [facility], a mental health provider in her area. Pt reports she attempted previous services and had a rather long wait and she left. CSW suggested that pt attempt services again in order to receive help with anxiety. [facility] would assist in getting to the source of the anxiety. CSW also spoke with pt regarding the diagnosis of Bipolar Disorder in which she denied. Pt reported [facility] was [facility]. CS[facility] the [facility] Pt voiced understanding. CM, [place] discussed with pt reasoning for several ED visits.   [address]Doris Derion Kreiter, ConnecticutLCSWA [phone]

## 2013-11-02 NOTE — ED Notes (Signed)
IV team paged.  Attempted 2 sticks.  Phlebotomy at bedside.

## 2013-11-03 LAB — URINE CULTURE
Colony Count: >=100000
Colony Count: >=100000

## 2013-11-04 NOTE — ED Notes (Signed)
Post ED Visit - Positive Culture Follow-up: Successful Patient Follow-Up  Culture assessed and recommendations reviewed by: []  Wes Dulaney, Pharm.D., BCPS []  Celedonio MiyamotoJeremy Frens, [address]Ph[address] BCPS []  Georgina PillionElizabeth Martin, [address]Pharm[address]D., BCPS [x]  Pine FlatMinh Pham, [place]Pharm.D., BCPS, AAHIVP []  Estella HuskMichelle Turner, Pharm.D., BCPS, AAHIVP  Positive Urine culture  []  Patient discharged without antimicrobial prescription and treatment is now indicated [x]  Organism is resistant to prescribed ED discharge antimicrobial []  Patient with positive blood cultures  Changes discussed with ED provider: Piepenbrink PA-C  New antibiotic prescription- Septra DS 1 po BID x 3 days pe      Larena Soxunnally, Mckinnon Glick Marie 11/04/2013, 4:29 PM

## 2013-11-04 NOTE — ED Provider Notes (Signed)
Medical screening examination/treatment/procedure(s) were performed by non-physician practitioner and as supervising physician I was immediately available for consultation/collaboration.  EKG Interpretation    Date/Time:  Monday November 02 2013 08:53:48 EST Ventricular Rate:  102 PR Interval:  138 QRS Duration: 82 QT Interval:  354 QTC Calculation: 461 R Axis:   95 Text Interpretation:  Sinus tachycardia Baseline wander Artifact Rightward axis Borderline ECG When compared with ECG of 10/29/2010 No significant change was found Confirmed by [facility]  MD, Anasha Perfecto [phone]([id number]) on 11/02/2013 9:05:40 AM              Laray AngerKathleen M Arya Luttrull, DO 11/04/13 1525

## 2013-11-04 NOTE — Progress Notes (Signed)
ED Antimicrobial Stewardship Positive Culture Follow Up   [name] [name] is an 43 y.o. female who presented to [facility] on 11/02/2013 with a chief complaint of  Chief Complaint  Patient presents with  . Abdominal Pain  . Back Pain    Recent Results (from the past 720 hour(s))  URINE CULTURE     Status: None   Collection Time    10/30/13  3:26 PM      Result Value Range Status   Specimen Description URINE, CLEAN CATCH   Final   Special Requests NONE   Final   Culture  Setup Time     Final   Value: 10/31/2013 00:32     Performed at [facility]   Colony Count     Final   Value: >=100,000 COLONIES/ML     Performed at [facility]   Culture     Final   Value: ESCHERICHIA COLI     Performed at [facility]   Report Status 11/02/2013 FINAL   Final   Organism ID, Bacteria ESCHERICHIA COLI   Final  URINE CULTURE     Status: None   Collection Time    10/31/13 12:15 PM      Result Value Range Status   Specimen Description URINE, CLEAN CATCH   Final   Special Requests NONE   Final   Culture  Setup Time     Final   Value: 10/31/2013 23:07     Performed at [facility]   Colony Count     Final   Value: >=100,000 COLONIES/ML     Performed at [facility]   Culture     Final   Value: ESCHERICHIA COLI     Performed at [facility]   Report Status 11/03/2013 FINAL   Final   Organism ID, Bacteria ESCHERICHIA COLI   Final  GC/CHLAMYDIA PROBE AMP     Status: None   Collection Time    10/31/13  2:40 PM      Result Value Range Status   CT Probe RNA NEGATIVE  NEGATIVE Final   GC Probe RNA NEGATIVE  NEGATIVE Final   Comment: (NOTE)                                                                                               **Normal Reference Range: Negative**          Assay performed using the Gen-Probe APTIMA COMBO2 (R) Assay.     Acceptable specimen types for this assay include APTIMA Swabs (Unisex,     endocervical, urethral, or  vaginal), first void urine, and ThinPrep     liquid based cytology samples.     Performed at [facility]  WET PREP, GENITAL     Status: Abnormal   Collection Time    10/31/13  2:40 PM      Result Value Range Status   Yeast Wet Prep HPF POC NONE SEEN  NONE SEEN Final   Trich, Wet Prep NONE SEEN  NONE SEEN Final   Clue Cells Wet Prep HPF POC  FEW (*) NONE SEEN Final   WBC, Wet Prep HPF POC FEW (*) NONE SEEN Final  URINE CULTURE     Status: None   Collection Time    11/02/13  9:47 AM      Result Value Range Status   Specimen Description URINE, RANDOM   Final   Special Requests NONE   Final   Culture  Setup Time     Final   Value: 11/02/2013 14:17     Performed at [facility] Count     Final   Value: >=100,000 COLONIES/ML     Performed at [facility]   Culture     Final   Value: ESCHERICHIA COLI     Performed at [facility]   Report Status 11/03/2013 FINAL   Final   Organism ID, Bacteria ESCHERICHIA COLI   Final    Pt was recently seen in the ED. Apparently she was sent home on abx but couldn't find out what in chart. Culture is back with e.coli that is almost pan sens.   New antibiotic prescription: Septra DS 1 PO BID x3 days if pt has not filled abx  ED Provider: Emi Holes, PA   Ulyses Southward, PharmD Pager: [phone] Infectious Diseases Pharmacist Phone# [phone]

## 2013-11-05 ENCOUNTER — Telehealth (HOSPITAL_COMMUNITY): Payer: Self-pay | Admitting: *Deleted

## 2013-12-22 ENCOUNTER — Telehealth: Payer: Self-pay | Admitting: Family Medicine

## 2013-12-22 MED ORDER — PROMETHAZINE HCL 25 MG PO TABS: 25.0000 mg | ORAL_TABLET | Freq: Four times a day (QID) | ORAL | Status: DC | PRN

## 2013-12-22 NOTE — Telephone Encounter (Signed)
Patient notified

## 2013-12-22 NOTE — Telephone Encounter (Signed)
Numb thirty one q 6 hrs prn nausea two ref, 25 mg

## 2013-12-22 NOTE — Telephone Encounter (Signed)
Patient would like Rx for phenergan for nausea and vomiting.  [facility]

## 2013-12-24 IMAGING — CR DG ABDOMEN ACUTE W/ 1V CHEST
3 series · 3 of 3 positions shown · non-contrast
Comparison: March 09, 2013

CLINICAL DATA: Abdominal pain

ACUTE ABDOMEN SERIES (ABDOMEN 2 VIEW & CHEST 1 VIEW)

[view not recorded (1 of 3)]
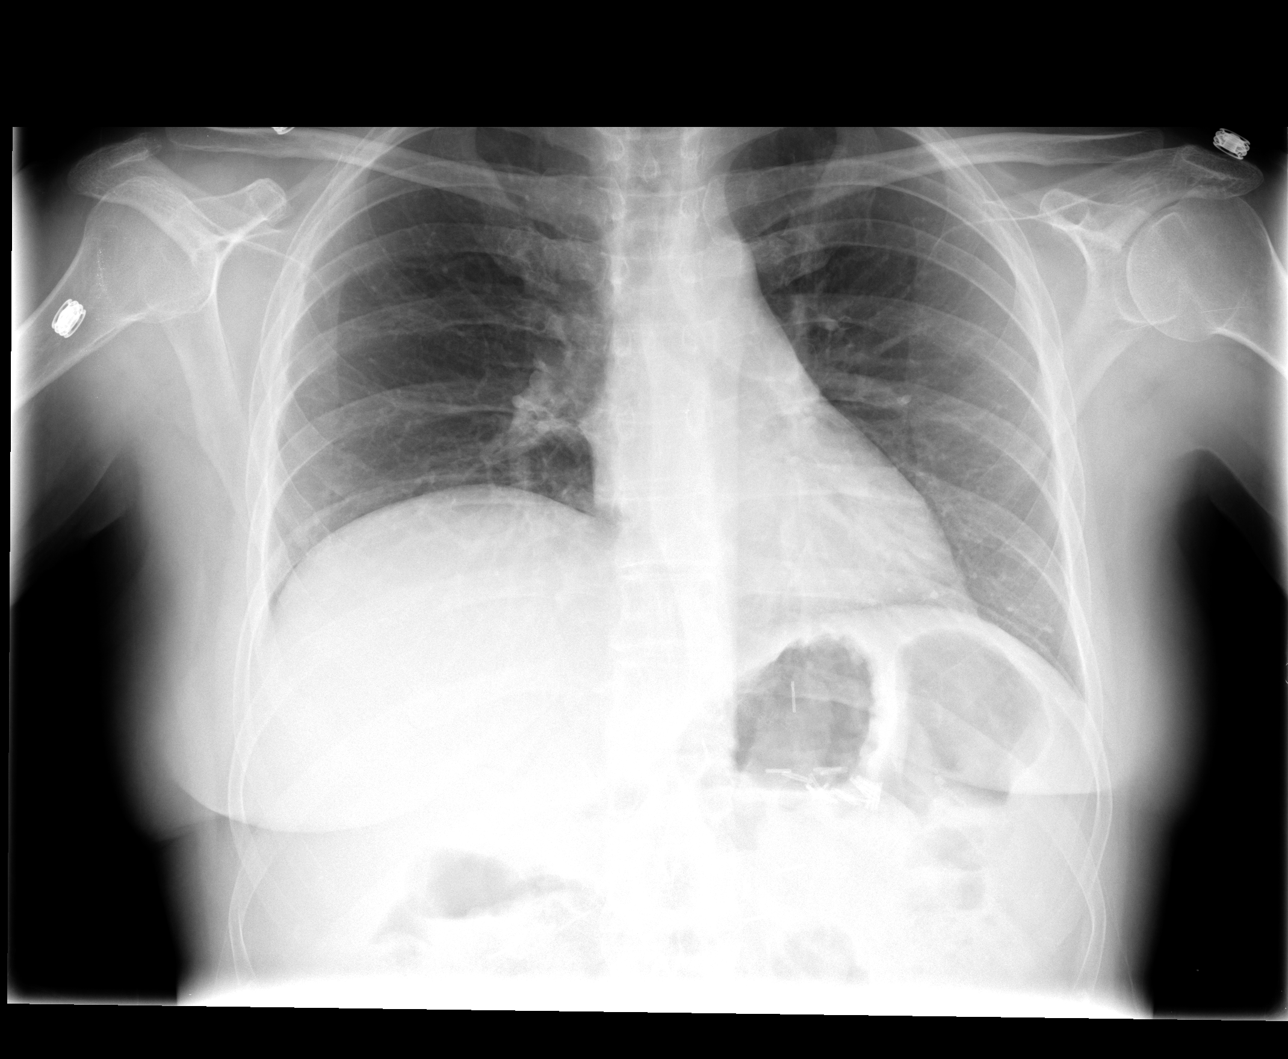

[view not recorded (2 of 3)]
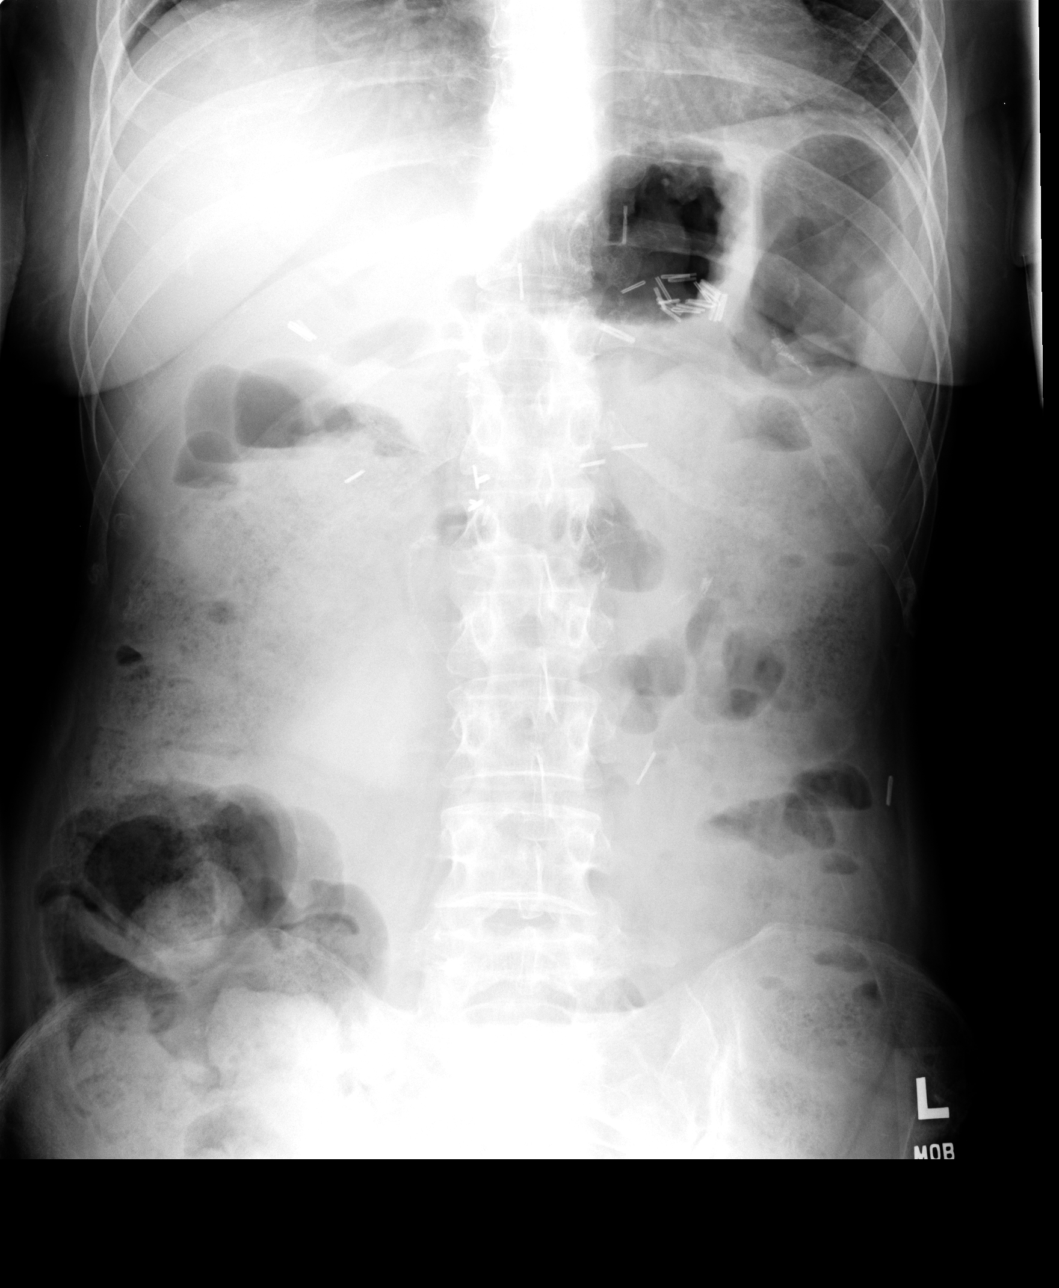

[view not recorded (3 of 3)]
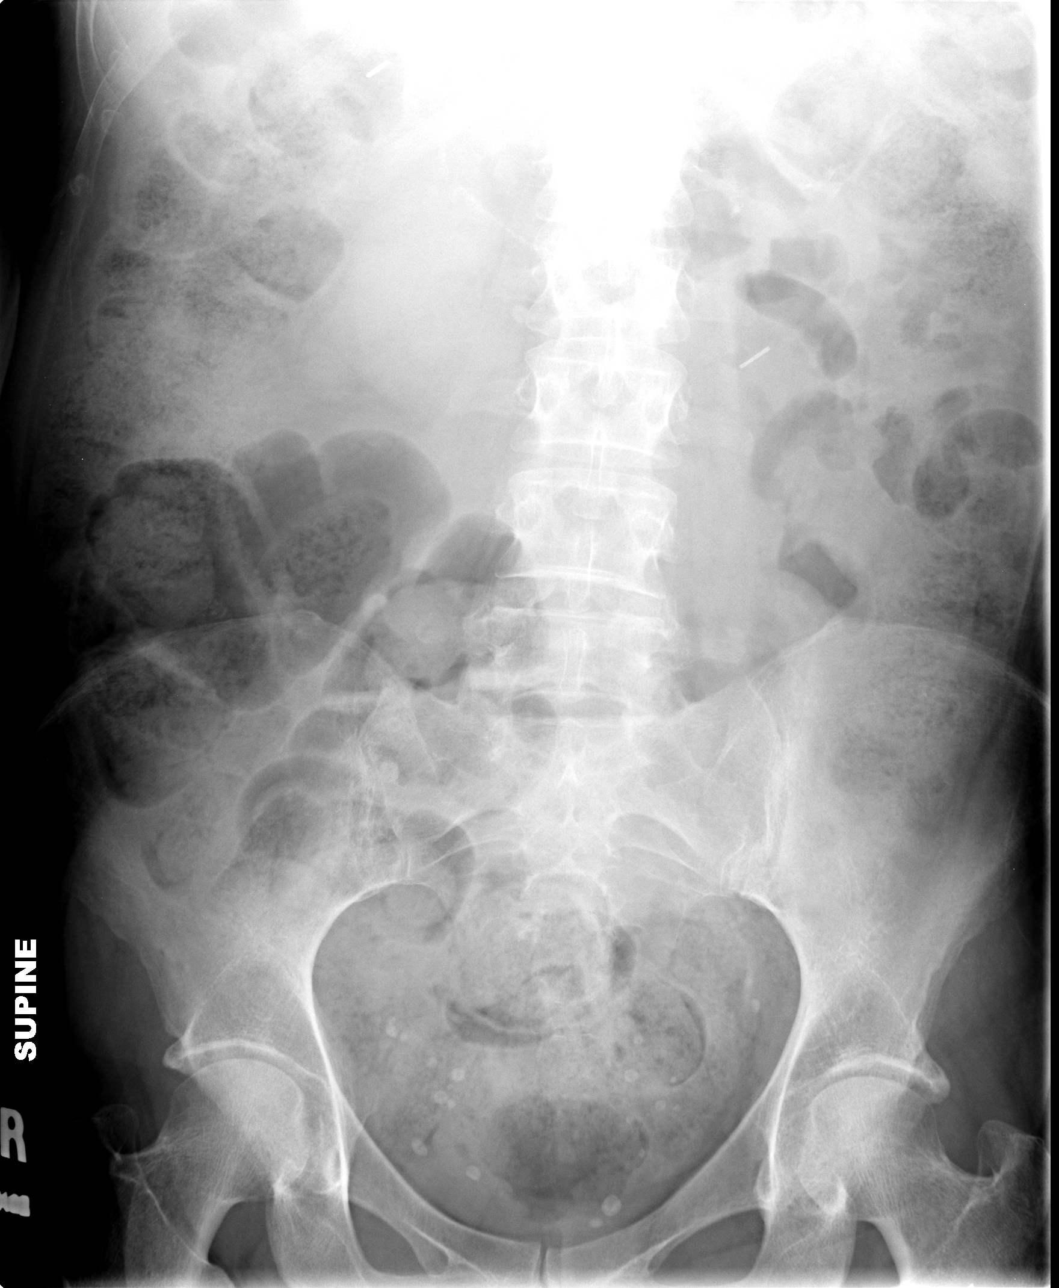

[3 of 3 positions shown; findings below may reference images not displayed]

FINDINGS: PA chest:  There is stable elevation of the right hemidiaphragm.
There is no edema or consolidation.  The heart size and pulmonary
vascularity are normal.  No adenopathy.

Supine and upright abdomen:  There is diffuse stool throughout the
colon.  Bowel gas pattern overall is unremarkable.  No obstruction
or free air.  There are surgical clips throughout the abdomen.
There are phleboliths throughout the pelvis.
IMPRESSION: Large amount of stool diffusely.  Nonspecific gas
pattern.  Extensive postoperative change.  No edema or
consolidation.

## 2014-03-26 DIAGNOSIS — Z79899 Other long term (current) drug therapy: Secondary | ICD-10-CM | POA: Diagnosis not present

## 2014-06-02 ENCOUNTER — Encounter (HOSPITAL_COMMUNITY): Payer: Self-pay | Admitting: Emergency Medicine

## 2014-06-02 ENCOUNTER — Emergency Department (HOSPITAL_COMMUNITY)
Admission: EM | Admit: 2014-06-02 | Discharge: 2014-06-02 | Disposition: A | Discharge status: Home / Self Care | Payer: Medicare Other | Attending: Emergency Medicine | Admitting: Emergency Medicine

## 2014-06-02 DIAGNOSIS — K2970 Gastritis, unspecified, without bleeding: Secondary | ICD-10-CM | POA: Insufficient documentation

## 2014-06-02 DIAGNOSIS — R1013 Epigastric pain: Secondary | ICD-10-CM | POA: Diagnosis not present

## 2014-06-02 DIAGNOSIS — Z8679 Personal history of other diseases of the circulatory system: Secondary | ICD-10-CM | POA: Insufficient documentation

## 2014-06-02 DIAGNOSIS — K2990 Gastroduodenitis, unspecified, without bleeding: Principal | ICD-10-CM

## 2014-06-02 DIAGNOSIS — Z87442 Personal history of urinary calculi: Secondary | ICD-10-CM | POA: Insufficient documentation

## 2014-06-02 DIAGNOSIS — R109 Unspecified abdominal pain: Secondary | ICD-10-CM | POA: Diagnosis not present

## 2014-06-02 DIAGNOSIS — G8929 Other chronic pain: Secondary | ICD-10-CM | POA: Diagnosis not present

## 2014-06-02 DIAGNOSIS — Z794 Long term (current) use of insulin: Secondary | ICD-10-CM | POA: Insufficient documentation

## 2014-06-02 DIAGNOSIS — R209 Unspecified disturbances of skin sensation: Secondary | ICD-10-CM | POA: Insufficient documentation

## 2014-06-02 DIAGNOSIS — Z79899 Other long term (current) drug therapy: Secondary | ICD-10-CM | POA: Insufficient documentation

## 2014-06-02 DIAGNOSIS — Z8659 Personal history of other mental and behavioral disorders: Secondary | ICD-10-CM | POA: Insufficient documentation

## 2014-06-02 DIAGNOSIS — M7989 Other specified soft tissue disorders: Secondary | ICD-10-CM | POA: Insufficient documentation

## 2014-06-02 DIAGNOSIS — E119 Type 2 diabetes mellitus without complications: Secondary | ICD-10-CM | POA: Diagnosis not present

## 2014-06-02 DIAGNOSIS — K2900 Acute gastritis without bleeding: Secondary | ICD-10-CM | POA: Diagnosis not present

## 2014-06-02 LAB — CBC WITH DIFFERENTIAL/PLATELET
BASOS ABS: 0 10*3/uL (ref 0.0–0.1)
Basophils Relative: 0 % (ref 0–1)
Eosinophils Absolute: 0 10*3/uL (ref 0.0–0.7)
Eosinophils Relative: 1 % (ref 0–5)
HCT: 41.4 % (ref 36.0–46.0)
Hemoglobin: 14.9 g/dL (ref 12.0–15.0)
LYMPHS ABS: 1.9 10*3/uL (ref 0.7–4.0)
LYMPHS PCT: 33 % (ref 12–46)
MCH: 33.5 pg (ref 26.0–34.0)
MCHC: 36 g/dL (ref 30.0–36.0)
MCV: 93 fL (ref 78.0–100.0)
Monocytes Absolute: 0.2 10*3/uL (ref 0.1–1.0)
Monocytes Relative: 3 % (ref 3–12)
Neutro Abs: 3.6 10*3/uL (ref 1.7–7.7)
Neutrophils Relative %: 63 % (ref 43–77)
Platelets: 160 10*3/uL (ref 150–400)
RBC: 4.45 MIL/uL (ref 3.87–5.11)
RDW: 12.3 % (ref 11.5–15.5)
WBC: 5.7 10*3/uL (ref 4.0–10.5)

## 2014-06-02 LAB — COMPREHENSIVE METABOLIC PANEL
ALT: 51 U/L — AB (ref 0–35)
AST: 28 U/L (ref 0–37)
Albumin: 3.8 g/dL (ref 3.5–5.2)
Alkaline Phosphatase: 99 U/L (ref 39–117)
Anion gap: 13 (ref 5–15)
BUN: 10 mg/dL (ref 6–23)
CALCIUM: 8.9 mg/dL (ref 8.4–10.5)
CO2: 22 mEq/L (ref 19–32)
Chloride: 100 mEq/L (ref 96–112)
Creatinine, Ser: 0.41 mg/dL — ABNORMAL LOW (ref 0.50–1.10)
GFR calc Af Amer: >90 mL/min (ref >90)
GFR calc non Af Amer: >90 mL/min (ref >90)
GLUCOSE: 407 mg/dL — AB (ref 70–99)
Potassium: 3.8 mEq/L (ref 3.7–5.3)
SODIUM: 135 meq/L — AB (ref 137–147)
Total Bilirubin: 0.4 mg/dL (ref 0.3–1.2)
Total Protein: 7 g/dL (ref 6.0–8.3)

## 2014-06-02 LAB — LIPASE, BLOOD: Lipase: 13 U/L (ref 11–59)

## 2014-06-02 MED ORDER — ONDANSETRON HCL 4 MG/2ML IJ SOLN
4.0000 mg | Freq: Once | INTRAMUSCULAR | Status: AC
  Administered 2014-06-02: 4 mg via INTRAVENOUS
  Filled 2014-06-02: qty 2

## 2014-06-02 MED ORDER — SODIUM CHLORIDE 0.9 % IV BOLUS (SEPSIS)
1000.0000 mL | Freq: Once | INTRAVENOUS | Status: AC
  Administered 2014-06-02: 1000 mL via INTRAVENOUS

## 2014-06-02 MED ORDER — ONDANSETRON 4 MG PO TBDP: 4.0000 mg | ORAL_TABLET | Freq: Three times a day (TID) | ORAL | Status: DC | PRN

## 2014-06-02 MED ORDER — MORPHINE SULFATE 4 MG/ML IJ SOLN
4.0000 mg | INTRAMUSCULAR | Status: DC | PRN
  Administered 2014-06-02: 4 mg via INTRAVENOUS
  Filled 2014-06-02 (×2): qty 1

## 2014-06-02 MED ORDER — SODIUM CHLORIDE 0.9 % IV SOLN
Freq: Once | INTRAVENOUS | Status: AC
  Administered 2014-06-02: 20:00:00 via INTRAVENOUS

## 2014-06-02 MED ORDER — HYDROCODONE-ACETAMINOPHEN 5-325 MG PO TABS: 2.0000 | ORAL_TABLET | ORAL | Status: DC | PRN

## 2014-06-02 MED ORDER — OMEPRAZOLE 20 MG PO CPDR: 20.0000 mg | DELAYED_RELEASE_CAPSULE | Freq: Two times a day (BID) | ORAL | Status: DC

## 2014-06-02 MED ORDER — FAMOTIDINE IN NACL 20-0.9 MG/50ML-% IV SOLN
20.0000 mg | Freq: Once | INTRAVENOUS | Status: AC
  Administered 2014-06-02: 20 mg via INTRAVENOUS
  Filled 2014-06-02: qty 50

## 2014-06-02 NOTE — Discharge Instructions (Signed)
Clear liquids tonight and tomorrow. Slowly advance your diet. Small frequent meals. Followup with your primary care  Abdominal Pain, Women Abdominal (stomach, pelvic, or belly) pain can be caused by many things. It is important to tell your doctor:  The location of the pain.  Does it come and go or is it present all the time?  Are there things that start the pain (eating certain foods, exercise)?  Are there other symptoms associated with the pain (fever, nausea, vomiting, diarrhea)? All of this is helpful to know when trying to find the cause of the pain. CAUSES   Stomach: virus or bacteria infection, or ulcer.  Intestine: appendicitis (inflamed appendix), regional ileitis (Crohn's disease), ulcerative colitis (inflamed colon), irritable bowel syndrome, diverticulitis (inflamed diverticulum of the colon), or cancer of the stomach or intestine.  Gallbladder disease or stones in the gallbladder.  Kidney disease, kidney stones, or infection.  Pancreas infection or cancer.  Fibromyalgia (pain disorder).  Diseases of the female organs:  Uterus: fibroid (non-cancerous) tumors or infection.  Fallopian tubes: infection or tubal pregnancy.  Ovary: cysts or tumors.  Pelvic adhesions (scar tissue).  Endometriosis (uterus lining tissue growing in the pelvis and on the pelvic organs).  Pelvic congestion syndrome (female organs filling up with blood just before the menstrual period).  Pain with the menstrual period.  Pain with ovulation (producing an egg).  Pain with an IUD (intrauterine device, birth control) in the uterus.  Cancer of the female organs.  Functional pain (pain not caused by a disease, may improve without treatment).  Psychological pain.  Depression. DIAGNOSIS  Your doctor will decide the seriousness of your pain by doing an examination.  Blood tests.  X-rays.  Ultrasound.  CT scan (computed tomography, special type of X-ray).  MRI (magnetic  resonance imaging).  Cultures, for infection.  Barium enema (dye inserted in the large intestine, to better view it with X-rays).  Colonoscopy (looking in intestine with a lighted tube).  Laparoscopy (minor surgery, looking in abdomen with a lighted tube).  Major abdominal exploratory surgery (looking in abdomen with a large incision). TREATMENT  The treatment will depend on the cause of the pain.   Many cases can be observed and treated at home.  Over-the-counter medicines recommended by your caregiver.  Prescription medicine.  Antibiotics, for infection.  Birth control pills, for painful periods or for ovulation pain.  Hormone treatment, for endometriosis.  Nerve blocking injections.  Physical therapy.  Antidepressants.  Counseling with a psychologist or psychiatrist.  Minor or major surgery. HOME CARE INSTRUCTIONS   Do not take laxatives, unless directed by your caregiver.  Take over-the-counter pain medicine only if ordered by your caregiver. Do not take aspirin because it can cause an upset stomach or bleeding.  Try a clear liquid diet (broth or water) as ordered by your caregiver. Slowly move to a bland diet, as tolerated, if the pain is related to the stomach or intestine.  Have a thermometer and take your temperature several times a day, and record it.  Bed rest and sleep, if it helps the pain.  Avoid sexual intercourse, if it causes pain.  Avoid stressful situations.  Keep your follow-up appointments and tests, as your caregiver orders.  If the pain does not go away with medicine or surgery, you may try:  Acupuncture.  Relaxation exercises (yoga, meditation).  Group therapy.  Counseling. SEEK MEDICAL CARE IF:   You notice certain foods cause stomach pain.  Your home care treatment is not helping your  pain.  You need stronger pain medicine.  You want your IUD removed.  You feel faint or lightheaded.  You develop nausea and  vomiting.  You develop a rash.  You are having side effects or an allergy to your medicine. SEEK IMMEDIATE MEDICAL CARE IF:   Your pain does not go away or gets worse.  You have a fever.  Your pain is felt only in portions of the abdomen. The right side could possibly be appendicitis. The left lower portion of the abdomen could be colitis or diverticulitis.  You are passing blood in your stools (bright red or black tarry stools, with or without vomiting).  You have blood in your urine.  You develop chills, with or without a fever.  You pass out. MAKE SURE YOU:   Understand these instructions.  Will watch your condition.  Will get help right away if you are not doing well or get worse. Document Released: 07/22/2007 Document Revised: 02/08/2014 Document Reviewed: 08/11/2009 [facility] Patient Information 2015 [place], [place]. This information is not intended to replace advice given to you by your health care provider. Make sure you discuss any questions you have with your health care provider.  Gastritis, Adult Gastritis is soreness and puffiness (inflammation) of the lining of the stomach. If you do not get help, gastritis can cause bleeding and sores (ulcers) in the stomach. HOME CARE   Only take medicine as told by your doctor.  If you were given antibiotic medicines, take them as told. Finish the medicines even if you start to feel better.  Drink enough fluids to keep your pee (urine) clear or pale yellow.  Avoid foods and drinks that make your problems worse. Foods you may want to avoid include:  Caffeine or alcohol.  Chocolate.  Mint.  Garlic and onions.  Spicy foods.  Citrus fruits, including oranges, lemons, or limes.  Food containing tomatoes, including sauce, chili, salsa, and pizza.  Fried and fatty foods.  Eat small meals throughout the day instead of large meals. GET HELP RIGHT AWAY IF:   You have black or dark red poop (stools).  You throw up  (vomit) blood. It may look like coffee grounds.  You cannot keep fluids down.  Your belly (abdominal) pain gets worse.  You have a fever.  You do not feel better after 1 week.  You have any other questions or concerns. MAKE SURE YOU:   Understand these instructions.  Will watch your condition.  Will get help right away if you are not doing well or get worse. Document Released: 03/12/2008 Document Revised: 12/17/2011 Document Reviewed: 11/07/2011 [facility] Patient Information 2015 [place], [place]. This information is not intended to replace advice given to you by your health care provider. Make sure you discuss any questions you have with your health care provider.

## 2014-06-02 NOTE — ED Provider Notes (Signed)
CSN: [id number]     Arrival date & time 06/02/14  1659 History   This chart was scribed for Rolland Porter, MD by Evon Slack, ED Scribe. This patient was seen in room APA07/APA07 and the patient's care was started at 5:49 PM.     Chief Complaint  Patient presents with  . Abdominal Pain   Patient is [name] [name] presenting with abdominal pain. The history is provided by the patient. No language interpreter was used.  Abdominal Pain Associated symptoms: nausea   Associated symptoms: no chest pain, no chills, no cough, no diarrhea, no dysuria, no fatigue, no fever, no hematuria, no shortness of breath, no sore throat and no vomiting    HPI Comments: [name] [name] is [name] 43 y.o. [name] who presents to the Emergency Department complaining of sharp epigastric abdominal pain onset this morning. [name] [name] states that [name] [name] has associated nausea. [name] [name] states [name] [name] has [name] Hx of stomach ulcers and chronic pancreatitis. 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 [name] [name] states [name] [name] hasn't had any surgical procedures in the past 2 years. [name] [name] states [name] [name] is on acid blocker for her stomach. [name] [name] states [name] [name] is also having left arm numbness for the past 2 days. [name] [name] describes the numbness as tingling from her elbow down into her fingers. [name] [name] also states the [name] [name] notices that [name] [name] has intermittent left leg swelling after standing for long periods of time. [name] [name] states [name] [name] has [name] Hx of diabetes that [name] [name] takes insulin for. [name] [name] denies dysuria   Past Medical History  Diagnosis Date  . Pancreatitis   . Diabetes mellitus   . Esophageal ulcer 1997  . Depression   . Anxiety   . Migraine headache   . Bipolar disorder     bipolar was [name] misdiagnoses   . Glucose intolerance (impaired glucose tolerance)   . Chronic pancreatitis   . Kidney stones   . Chronic abdominal pain   . Chronic pain     has pain specialist  . Chronic constipation   . Fatty liver    Past Surgical History  Procedure Laterality Date  . Whipple procedure     . Intestinal blockage    . Abdominal surgery    . Abdominal hysterectomy    . Cholecystectomy    . Cesarean section    . Colonoscopy     History reviewed. No pertinent family history. History  Substance Use Topics  . Smoking status: Never Smoker   . Smokeless tobacco: Not on file  . Alcohol Use: No   OB History   Grav Para Term Preterm Abortions TAB SAB Ect Mult Living                 Review of Systems  Constitutional: Negative for fever, chills, diaphoresis, appetite change and fatigue.  HENT: Negative for mouth sores, sore throat and trouble swallowing.   Eyes: Negative for visual disturbance.  Respiratory: Negative for cough, chest tightness, shortness of breath and wheezing.   Cardiovascular: Positive for leg swelling. Negative for chest pain.  Gastrointestinal: Positive for nausea and abdominal pain. Negative for vomiting, diarrhea and abdominal distention.  Endocrine: Negative for polydipsia, polyphagia and polyuria.  Genitourinary: Negative for dysuria, frequency and hematuria.  Musculoskeletal: Negative for gait problem.  Skin: Negative for color change, pallor and rash.  Neurological: Positive for numbness. Negative for dizziness, syncope, light-headedness and headaches.  Hematological: Does not bruise/bleed easily.  Psychiatric/Behavioral: Negative for behavioral problems and confusion.    Allergies  Buspar and Metoclopramide hcl  Home Medications   Prior to Admission medications   Medication Sig Start Date End Date Taking? Authorizing Provider  Eszopiclone (ESZOPICLONE) 3 MG TABS Take 3 mg by mouth at bedtime. lunesta Take immediately before bedtime   Yes Historical Provider, MD  Insulin Glargine (LANTUS SOLOSTAR) 100 UNIT/ML Solostar Pen Inject 22 Units into the skin at bedtime.   Yes Historical Provider, MD  insulin lispro (HUMALOG) 100 UNIT/ML injection Inject 0-9 Units into the skin 3 (three) times daily. 0-9 Units, Subcutaneous, 3 times daily with  meals, CBG < 70: Drink or eat something sweet and recheck blood sugar. CBG 70 - 120: 0 units CBG 121 - 150: 1 unit CBG 151 - 200: 2 units CBG 201 - 250: 3 units CBG 251 - 300: 5 units CBG 301 - 350: 7 units CBG 351 - 400: 9 units CBG > 400: call MD 06/05/13  Yes Merlyn Albert, MD  promethazine (PHENERGAN) 50 MG tablet Take 50 mg by mouth every 6 (six) hours as needed for nausea or vomiting.   Yes Historical Provider, MD  HYDROcodone-acetaminophen (NORCO/VICODIN) 5-325 MG per tablet Take 2 tablets by mouth every 4 (four) hours as needed. 06/02/14   Rolland Porter, MD  omeprazole (PRILOSEC) 20 MG capsule Take 1 capsule (20 mg total) by mouth 2 (two) times daily. 06/02/14   Rolland Porter, MD  ondansetron (ZOFRAN ODT) 4 MG disintegrating tablet Take 1 tablet (4 mg total) by mouth every 8 (eight) hours as needed for nausea. 06/02/14   Rolland Porter, MD   Triage Vitals: BP 130/81  Pulse 105  Temp(Src) 98.6 F (37 C) (Oral)  Ht  (1.6 m)  Wt 130 lb (58.968 kg)  BMI 23.03 kg/m2  SpO2 98%  Physical Exam  Nursing note and vitals reviewed. Constitutional: [name] [name] is oriented to person, place, and time. [name] [name] appears well-developed and well-nourished. No distress.  HENT:  Head: Normocephalic.  Eyes: Conjunctivae are normal. Pupils are equal, round, and reactive to light. No scleral icterus.  Neck: Normal range of motion. Neck supple. No thyromegaly present.  Cardiovascular: Normal rate and regular rhythm.  Exam reveals no gallop and no friction rub.   No murmur heard. Pulmonary/Chest: Effort normal and breath sounds normal. No respiratory distress. [name] [name] has no wheezes. [name] [name] has no rales.  Abdominal: Soft. Bowel sounds are normal. [name] [name] exhibits no distension. There is tenderness in the epigastric area. There is no rebound.  Musculoskeletal: Normal range of motion.  Neurological: [name] [name] is alert and oriented to person, place, and time. No cranial nerve deficit or sensory deficit.  No pronator drift, normal left  extremity neurological exam. Normal cap refill    Skin: Skin is warm and dry. No rash noted.  Psychiatric: [name] [name] has [name] normal mood and affect. Her behavior is normal.    ED Course  Procedures (including critical care time) DIAGNOSTIC STUDIES: Oxygen Saturation is 98% on RA, normal by my interpretation.    COORDINATION OF CARE: 6:17 PM-Discussed treatment plan which includes Zofran and pain medication with [name] [name] at bedside and [name] [name] agreed to plan.     Labs Review Labs Reviewed  COMPREHENSIVE METABOLIC PANEL - Abnormal; Notable for the following:    Sodium 135 (*)    Glucose, Bld 407 (*)    Creatinine, Ser 0.41 (*)    ALT 51 (*)    All other components within normal limits  CBC WITH DIFFERENTIAL  LIPASE, BLOOD    Imaging Review No results found.  EKG Interpretation None      MDM   Final diagnoses:  Abdominal pain, unspecified abdominal location  Gastritis       I personally performed the services described in this documentation, which was scribed in my presence. The recorded information has been reviewed and is accurate.      Rolland Porter, MD 06/02/14 2306

## 2014-06-02 NOTE — ED Notes (Signed)
Pt c/o epigastric abd pain that started yesterday with nausea. PSH of whipple procedure. Pt also reports numbness to left arm that started this AM and LLE swelling "over the last few weeks".

## 2014-06-28 ENCOUNTER — Encounter: Payer: Self-pay | Admitting: Family Medicine

## 2014-06-28 ENCOUNTER — Ambulatory Visit (INDEPENDENT_AMBULATORY_CARE_PROVIDER_SITE_OTHER): Payer: Medicare Other | Admitting: Family Medicine

## 2014-06-28 VITALS — Temp 98.9°F | Ht 63.0 in | Wt 130.2 lb

## 2014-06-28 DIAGNOSIS — J329 Chronic sinusitis, unspecified: Secondary | ICD-10-CM

## 2014-06-28 DIAGNOSIS — G4700 Insomnia, unspecified: Secondary | ICD-10-CM | POA: Diagnosis not present

## 2014-06-28 DIAGNOSIS — J310 Chronic rhinitis: Secondary | ICD-10-CM

## 2014-06-28 MED ORDER — AMOXICILLIN 500 MG PO CAPS: 500.0000 mg | ORAL_CAPSULE | Freq: Three times a day (TID) | ORAL | Status: DC

## 2014-06-28 NOTE — Progress Notes (Signed)
   Subjective:    Patient ID: [name] [name], female    DOB: 12-12-1970, 43 y.o.   MRN: [id number]  Cough This is a new problem. The current episode started in the past 7 days. Associated symptoms include headaches and nasal congestion. Associated symptoms comments: Diarrhea, abd pain. Treatments tried: benadryl.   Highest temp not measured  Sinus cong and headache  otc bendryl prn and sudafed  Little cough, not bad, but yell green gunkiness fr nose   Review of Systems  Respiratory: Positive for cough.   Neurological: Positive for headaches.       Objective:   Physical Exam Alert mild malaise. H&T mom his congestion frontal tenderness pharynx erythematous neck supple lungs clear. Heart regular in rhythm      Assessment & Plan:  Impression acute rhinosinusitis plan antibiotics prescribed. Addendum patient states she would like to try Ambien for sleep purposes. She has been on generic when asked. And it helped more in the past patient would like to switch back and I think this is reasonable. Of note she states no longer going to pain specialist, I have advised her if she needs any narcotic intervention she will need a pain specialist once again. Patient expresses understanding WSL

## 2014-07-01 ENCOUNTER — Other Ambulatory Visit: Payer: Self-pay | Admitting: *Deleted

## 2014-07-01 ENCOUNTER — Telehealth: Payer: Self-pay | Admitting: Family Medicine

## 2014-07-01 MED ORDER — ZOLPIDEM TARTRATE 10 MG PO TABS: 10.0000 mg | ORAL_TABLET | Freq: Every evening | ORAL | Status: DC | PRN

## 2014-07-01 NOTE — Telephone Encounter (Signed)
Patient seen on 06/28/14 for rhino sinusitis. She said that she was supposed to have Rx sent in for [place]. Please advise.    [facility]

## 2014-07-01 NOTE — Telephone Encounter (Signed)
Lets do amb 10 numb thirty fiv ref one qhs prn

## 2014-07-01 NOTE — Telephone Encounter (Signed)
lunesta on med list but office note states she wants to try [place]

## 2014-07-02 NOTE — Telephone Encounter (Signed)
Notified patient script was faxed to pharmacy yesterday.

## 2014-08-31 ENCOUNTER — Ambulatory Visit (INDEPENDENT_AMBULATORY_CARE_PROVIDER_SITE_OTHER): Payer: Medicare Other | Admitting: Family Medicine

## 2014-08-31 ENCOUNTER — Encounter: Payer: Self-pay | Admitting: Family Medicine

## 2014-08-31 VITALS — BP 120/72 | Ht 63.0 in | Wt 127.6 lb

## 2014-08-31 DIAGNOSIS — J329 Chronic sinusitis, unspecified: Secondary | ICD-10-CM | POA: Diagnosis not present

## 2014-08-31 MED ORDER — OMEPRAZOLE 20 MG PO CPDR: 20.0000 mg | DELAYED_RELEASE_CAPSULE | Freq: Two times a day (BID) | ORAL | Status: DC

## 2014-08-31 MED ORDER — LEVOFLOXACIN 500 MG PO TABS: 500.0000 mg | ORAL_TABLET | Freq: Every day | ORAL | Status: DC

## 2014-08-31 NOTE — Progress Notes (Signed)
   Subjective:    Patient ID: [name] [name], female    DOB: 10-13-1970, 43 y.o.   MRN: [id number]  Sinusitis This is a new problem. The current episode started in the past 7 days. Associated symptoms include congestion, coughing and headaches. (Body aches and abd pain) Treatments tried: tylenol sinus.    PMH benign  Review of Systems  HENT: Positive for congestion.   Respiratory: Positive for cough.   Neurological: Positive for headaches.       Objective:   Physical Exam  Eardrums normal mild sinus tenderness throat normal neck supple lungs clear heart regular      Assessment & Plan:  Sinusitis antibodies prescribe warning signs discussed follow-up if ongoing troubles

## 2014-10-13 ENCOUNTER — Ambulatory Visit: Payer: Medicare Other | Admitting: Family Medicine

## 2014-10-14 ENCOUNTER — Ambulatory Visit (INDEPENDENT_AMBULATORY_CARE_PROVIDER_SITE_OTHER): Payer: Medicare Other | Admitting: Family Medicine

## 2014-10-14 ENCOUNTER — Encounter: Payer: Self-pay | Admitting: Family Medicine

## 2014-10-14 VITALS — Temp 98.4°F | Ht 63.0 in | Wt 128.8 lb

## 2014-10-14 DIAGNOSIS — J329 Chronic sinusitis, unspecified: Secondary | ICD-10-CM | POA: Diagnosis not present

## 2014-10-14 DIAGNOSIS — J310 Chronic rhinitis: Secondary | ICD-10-CM

## 2014-10-14 MED ORDER — LEVOFLOXACIN 500 MG PO TABS: 500.0000 mg | ORAL_TABLET | Freq: Every day | ORAL | Status: AC

## 2014-10-14 NOTE — Progress Notes (Signed)
   Subjective:    Patient ID: Tyna Jakschrystal R Chittum, female    DOB: [birth year], 44 y.o.   MRN: [id number]  HPI  Patient arrives with complaint of congestion, H/A, Body aches, stomach pain and sore throat for several days. Patient has tried Sudafed and Nyquil  Prn meds   dimished energy  Some abd discomfort  Appetite diminshed   bac head and achiness in shoulder right side the worse     Review of Systems No vomiting no diarrhea no rash    Objective:   Physical Exam  Alert moderate malaise vital stable HEENT moderate nasal congestion frontal tenderness. Pharynx normal. Lungs clear. Heart regular in rhythm.      Assessment & Plan:  Impression rhinosinusitis plan Levaquin daily 10 days. Since Medicare discussed. Warning signs discussed. WSL

## 2014-10-15 DIAGNOSIS — E1065 Type 1 diabetes mellitus with hyperglycemia: Secondary | ICD-10-CM | POA: Diagnosis not present

## 2014-10-18 DIAGNOSIS — E1065 Type 1 diabetes mellitus with hyperglycemia: Secondary | ICD-10-CM | POA: Diagnosis not present

## 2014-10-25 DIAGNOSIS — E1065 Type 1 diabetes mellitus with hyperglycemia: Secondary | ICD-10-CM | POA: Diagnosis not present

## 2014-10-27 ENCOUNTER — Encounter: Payer: Medicare Other | Attending: "Endocrinology | Admitting: Nutrition

## 2014-10-27 ENCOUNTER — Encounter: Payer: Self-pay | Admitting: Nutrition

## 2014-10-27 VITALS — Ht 63.0 in | Wt 136.0 lb

## 2014-10-27 DIAGNOSIS — Z794 Long term (current) use of insulin: Secondary | ICD-10-CM | POA: Diagnosis not present

## 2014-10-27 DIAGNOSIS — Z713 Dietary counseling and surveillance: Secondary | ICD-10-CM | POA: Diagnosis not present

## 2014-10-27 DIAGNOSIS — E108 Type 1 diabetes mellitus with unspecified complications: Secondary | ICD-10-CM | POA: Insufficient documentation

## 2014-10-27 DIAGNOSIS — IMO0002 Reserved for concepts with insufficient information to code with codable children: Secondary | ICD-10-CM

## 2014-10-27 DIAGNOSIS — E1065 Type 1 diabetes mellitus with hyperglycemia: Secondary | ICD-10-CM | POA: Diagnosis not present

## 2014-10-27 NOTE — Progress Notes (Signed)
  Medical Nutrition Therapy:  Appt start time:1530 end time:  1630.  Assessment:  Primary concerns today: Diabetes. LIves with her husband and 2 kids. She does the cooking and shopping. Eats three meals per days. Just recently gotten back on track with eating better and healthier food items. Most recent A1C 10.8%. BS running upper 200's before meals and aat bedtime. 50 units Toujeo and 15 of Humalog before meals. Physical Activing: walking. Wants to learn how to count carbs and get bs under better control.. Weight flucations from 120-130's. Today's weight is the most she has weighed in a long time. Doesn't take creon and tries to take probiotics. Willing to start taking Creon as prescribed.  Preferred Learning Style:   Auditory    No preference indicated   Learning Readiness:     Ready  Change in progress   MEDICATIONS: See list   DIETARY INTAKE:  24-hr recall:  B ( AM): Cherrios, skim milk,  1-2 eggs,  Snk ( AM):  L ( PM): Chicken nachos with black beans and chicken, water Snk ( PM): water D ( PM): lasagna with ww noodles and LF cheese Snk ( PM): yogurt and berries and nuts Beverages: water  Usual physical activity:   Estimated energy needs: 1500 calories 170 g carbohydrates 112 g protein 42  g fat  Progress Towards Goal(s):  In progress.   Nutritional Diagnosis:     Intervention:  Nutrition counseling on diabetes, CHO Counting, meal planning, portion sizes, complications of DM and signs/symptoms and treatment of hypo/hyperglcyemia.  Goals:  Follow Diabetes Meal Plan as instructed  Eat 3 meals   Take Creon before meals  Limit carbohydrate intake to 45 grams carbohydrate/meal  Add lean protein foods to meals/snacks  Monitor glucose levels as instructed by your doctor  Aim for 30 mins of physical activity daily  Bring food record and glucose log to your next nutrition visit  Goal: Get A1C down to less than 8% in three months. 2. Follow Plate Method 3.  Measure foods out for portion control   Teaching Method Utilized:  Visual Auditory Hands on  Handouts given during visit include: The Plate Method Carb Counting and Food Label handouts Meal Plan Card    Barriers to learning/adherence to lifestyle change: none  Demonstrated degree of understanding via:  Teach Back   Monitoring/Evaluation:  Dietary intake, exercise, meal planning, SBG, and body weight in 1 month(s).

## 2014-10-27 NOTE — Patient Instructions (Signed)
Goals:  Follow Diabetes Meal Plan as instructed  Eat 3 meals   Take Creon before meals  Limit carbohydrate intake to 45 grams carbohydrate/meal  Add lean protein foods to meals/snacks  Monitor glucose levels as instructed by your doctor  Aim for 30 mins of physical activity daily  Bring food record and glucose log to your next nutrition visit  Goal: Get A1C down to less than 8% in three months. 2. Follow Plate Method 3. Measure foods out for portion control

## 2014-11-17 ENCOUNTER — Ambulatory Visit (INDEPENDENT_AMBULATORY_CARE_PROVIDER_SITE_OTHER): Payer: Medicare Other | Admitting: Family Medicine

## 2014-11-17 ENCOUNTER — Encounter: Payer: Self-pay | Admitting: Family Medicine

## 2014-11-17 VITALS — BP 122/88 | Temp 98.9°F | Ht 63.0 in | Wt 136.0 lb

## 2014-11-17 DIAGNOSIS — R319 Hematuria, unspecified: Secondary | ICD-10-CM | POA: Diagnosis not present

## 2014-11-17 DIAGNOSIS — R109 Unspecified abdominal pain: Secondary | ICD-10-CM | POA: Diagnosis not present

## 2014-11-17 LAB — POCT URINALYSIS DIPSTICK
GLUCOSE UA: 50
RBC UA: 250
SPEC GRAV UA: 1.02
pH, UA: 5

## 2014-11-17 MED ORDER — PROMETHAZINE HCL 50 MG PO TABS: 50.0000 mg | ORAL_TABLET | Freq: Four times a day (QID) | ORAL | Status: DC | PRN

## 2014-11-17 MED ORDER — OXYCODONE-ACETAMINOPHEN 5-325 MG PO TABS: 1.0000 | ORAL_TABLET | Freq: Four times a day (QID) | ORAL | Status: DC | PRN

## 2014-11-17 NOTE — Progress Notes (Signed)
   Subjective:    Patient ID: [name] [name], female    DOB: [birth year], 44 y.o.   MRN: [id number]  Abdominal Pain This is a new problem. The current episode started in the past 7 days (2 days ago). Pain location: low abdomen. Associated symptoms include nausea. She has tried acetaminophen (ibuprofen) for the symptoms. The treatment provided mild relief.   Night before last developed discomfort  Now having diarrhea  Bad cramping sens with bowels   Sig low   No fever  Some nausea  Diarrhea last few days  On further history the pain was colicky often in nature left flank extending into low abdomen. Patient does have a history of kidney stones. Slight dysuria. No increase frequency. Pain fairly severe at times.   Review of Systems  Gastrointestinal: Positive for nausea and abdominal pain.   No chest pain no headache no cough no fever ROS otherwise negative    Objective:   Physical Exam Alert mild malaise. Vital stable lungs clear heart regular in rhythm. No true CVA tenderness diffuse low abdominal tenderness multiple surgical scars noted mild epigastric tenderness which patient reports baseline  Urinalysis 6 red blood cells per high-power field no at apices no white blood cells       Assessment & Plan:  Impression acute abdominal pain with colic nature. Review of records shows prior stones on the left side. None evident at this time. Patient could have passed a stone. Does not appear and major distress at this time and feel ER evaluation not necessary. Patient  complicated in terms of GI history. This fact is discussed frankly with patient. She's also had a difficulty with excess medication use in the past. Also frankly discussed plan with probable passage of stone in residual pain recommend oxycodone short-term use only increase hydration warning signs discussed 25 minutes spent most in discussion. WSL

## 2014-11-18 ENCOUNTER — Encounter (HOSPITAL_COMMUNITY): Payer: Self-pay | Admitting: Emergency Medicine

## 2014-11-18 ENCOUNTER — Emergency Department (HOSPITAL_COMMUNITY): Payer: Medicare Other

## 2014-11-18 ENCOUNTER — Telehealth: Payer: Self-pay | Admitting: Family Medicine

## 2014-11-18 ENCOUNTER — Emergency Department (HOSPITAL_COMMUNITY)
Admission: EM | Admit: 2014-11-18 | Discharge: 2014-11-18 | Disposition: A | Discharge status: Home / Self Care | Payer: Medicare Other | Attending: Emergency Medicine | Admitting: Emergency Medicine

## 2014-11-18 DIAGNOSIS — K828 Other specified diseases of gallbladder: Secondary | ICD-10-CM | POA: Diagnosis not present

## 2014-11-18 DIAGNOSIS — Z794 Long term (current) use of insulin: Secondary | ICD-10-CM | POA: Diagnosis not present

## 2014-11-18 DIAGNOSIS — N201 Calculus of ureter: Secondary | ICD-10-CM | POA: Insufficient documentation

## 2014-11-18 DIAGNOSIS — Z8659 Personal history of other mental and behavioral disorders: Secondary | ICD-10-CM | POA: Insufficient documentation

## 2014-11-18 DIAGNOSIS — Z79899 Other long term (current) drug therapy: Secondary | ICD-10-CM | POA: Diagnosis not present

## 2014-11-18 DIAGNOSIS — Z8679 Personal history of other diseases of the circulatory system: Secondary | ICD-10-CM | POA: Insufficient documentation

## 2014-11-18 DIAGNOSIS — Z87442 Personal history of urinary calculi: Secondary | ICD-10-CM | POA: Diagnosis not present

## 2014-11-18 DIAGNOSIS — Z8719 Personal history of other diseases of the digestive system: Secondary | ICD-10-CM | POA: Diagnosis not present

## 2014-11-18 DIAGNOSIS — E162 Hypoglycemia, unspecified: Secondary | ICD-10-CM

## 2014-11-18 DIAGNOSIS — R109 Unspecified abdominal pain: Secondary | ICD-10-CM | POA: Diagnosis present

## 2014-11-18 DIAGNOSIS — E11649 Type 2 diabetes mellitus with hypoglycemia without coma: Secondary | ICD-10-CM | POA: Diagnosis not present

## 2014-11-18 DIAGNOSIS — G8929 Other chronic pain: Secondary | ICD-10-CM | POA: Diagnosis not present

## 2014-11-18 LAB — CBC WITH DIFFERENTIAL/PLATELET
Basophils Absolute: 0 10*3/uL (ref 0.0–0.1)
Basophils Relative: 0 % (ref 0–1)
EOS ABS: 0 10*3/uL (ref 0.0–0.7)
Eosinophils Relative: 1 % (ref 0–5)
HCT: 40.9 % (ref 36.0–46.0)
HEMOGLOBIN: 14.3 g/dL (ref 12.0–15.0)
Lymphocytes Relative: 46 % (ref 12–46)
Lymphs Abs: 2.7 10*3/uL (ref 0.7–4.0)
MCH: 33.6 pg (ref 26.0–34.0)
MCHC: 35 g/dL (ref 30.0–36.0)
MCV: 96.2 fL (ref 78.0–100.0)
MONOS PCT: 5 % (ref 3–12)
Monocytes Absolute: 0.3 10*3/uL (ref 0.1–1.0)
NEUTROS PCT: 48 % (ref 43–77)
Neutro Abs: 2.8 10*3/uL (ref 1.7–7.7)
Platelets: 216 10*3/uL (ref 150–400)
RBC: 4.25 MIL/uL (ref 3.87–5.11)
RDW: 12 % (ref 11.5–15.5)
WBC: 5.9 10*3/uL (ref 4.0–10.5)

## 2014-11-18 LAB — URINE MICROSCOPIC-ADD ON

## 2014-11-18 LAB — URINALYSIS, ROUTINE W REFLEX MICROSCOPIC
BILIRUBIN URINE: NEGATIVE
Glucose, UA: 250 mg/dL — AB
KETONES UR: NEGATIVE mg/dL
Leukocytes, UA: NEGATIVE
Nitrite: NEGATIVE
PROTEIN: NEGATIVE mg/dL
Specific Gravity, Urine: >1.03 — ABNORMAL HIGH (ref 1.005–1.030)
UROBILINOGEN UA: 0.2 mg/dL (ref 0.0–1.0)
pH: 5.5 (ref 5.0–8.0)

## 2014-11-18 LAB — COMPREHENSIVE METABOLIC PANEL
ALBUMIN: 3.9 g/dL (ref 3.5–5.2)
ALT: 24 U/L (ref 0–35)
ANION GAP: 7 (ref 5–15)
AST: 22 U/L (ref 0–37)
Alkaline Phosphatase: 61 U/L (ref 39–117)
BILIRUBIN TOTAL: 0.4 mg/dL (ref 0.3–1.2)
BUN: 11 mg/dL (ref 6–23)
CHLORIDE: 108 mmol/L (ref 96–112)
CO2: 19 mmol/L (ref 19–32)
CREATININE: 0.47 mg/dL — AB (ref 0.50–1.10)
Calcium: 9.1 mg/dL (ref 8.4–10.5)
GLUCOSE: 239 mg/dL — AB (ref 70–99)
Potassium: 4.1 mmol/L (ref 3.5–5.1)
Sodium: 134 mmol/L — ABNORMAL LOW (ref 135–145)
Total Protein: 7.1 g/dL (ref 6.0–8.3)

## 2014-11-18 LAB — CBG MONITORING, ED
GLUCOSE-CAPILLARY: 118 mg/dL — AB (ref 70–99)
GLUCOSE-CAPILLARY: 41 mg/dL — AB (ref 70–99)
GLUCOSE-CAPILLARY: 246 mg/dL — AB (ref 70–99)
Glucose-Capillary: 52 mg/dL — ABNORMAL LOW (ref 70–99)

## 2014-11-18 MED ORDER — MORPHINE SULFATE 4 MG/ML IJ SOLN
4.0000 mg | Freq: Once | INTRAMUSCULAR | Status: AC
  Administered 2014-11-18: 4 mg via INTRAMUSCULAR

## 2014-11-18 MED ORDER — ONDANSETRON HCL 4 MG/2ML IJ SOLN
4.0000 mg | Freq: Once | INTRAMUSCULAR | Status: DC
  Filled 2014-11-18: qty 2

## 2014-11-18 MED ORDER — HYDROMORPHONE HCL 1 MG/ML IJ SOLN
1.0000 mg | Freq: Once | INTRAMUSCULAR | Status: AC
  Administered 2014-11-18: 1 mg via INTRAMUSCULAR
  Filled 2014-11-18: qty 1

## 2014-11-18 MED ORDER — ONDANSETRON HCL 4 MG/2ML IJ SOLN
4.0000 mg | Freq: Once | INTRAMUSCULAR | Status: AC
  Administered 2014-11-18: 4 mg via INTRAMUSCULAR

## 2014-11-18 MED ORDER — KETOROLAC TROMETHAMINE 30 MG/ML IJ SOLN
60.0000 mg | Freq: Once | INTRAMUSCULAR | Status: AC
  Administered 2014-11-18: 60 mg via INTRAMUSCULAR
  Filled 2014-11-18: qty 2

## 2014-11-18 MED ORDER — MORPHINE SULFATE 2 MG/ML IJ SOLN: 2.0000 mg | Freq: Once | INTRAMUSCULAR | Status: DC

## 2014-11-18 MED ORDER — OXYCODONE-ACETAMINOPHEN 10-325 MG PO TABS: 1.0000 | ORAL_TABLET | ORAL | Status: DC | PRN

## 2014-11-18 MED ORDER — GLUCAGON HCL RDNA (DIAGNOSTIC) 1 MG IJ SOLR
1.0000 mg | Freq: Once | INTRAMUSCULAR | Status: AC
  Administered 2014-11-18: 1 mg via INTRAMUSCULAR

## 2014-11-18 MED ORDER — MORPHINE SULFATE 4 MG/ML IJ SOLN
4.0000 mg | Freq: Once | INTRAMUSCULAR | Status: DC
  Filled 2014-11-18: qty 1

## 2014-11-18 MED ORDER — GLUCAGON HCL RDNA (DIAGNOSTIC) 1 MG IJ SOLR
INTRAMUSCULAR | Status: AC
  Filled 2014-11-18: qty 1

## 2014-11-18 MED ORDER — MORPHINE SULFATE 2 MG/ML IJ SOLN: 4.0000 mg | Freq: Once | INTRAMUSCULAR | Status: DC

## 2014-11-18 NOTE — Telephone Encounter (Signed)
If pain is breaking thru and severe needs further definitive w u before piling on more pain meds,i.e. Specific w u to test for kid stones or other cause of pain with her complexities,  needs er visit, likely will get scan and fluids and uro referral there, give er triage nurse this isnfo too

## 2014-11-18 NOTE — ED Notes (Signed)
PT c/o left flank pain and lower left abdominal pain x2 days. PT states hx of kidney stones and was seen at her MD office yesterday and states no relief from the pain medication she was given. PT denies any urinary symptoms but states was told she had blood in her urine at the MD office.

## 2014-11-18 NOTE — Telephone Encounter (Signed)
Pt seen yesterday 2/10 an was told she has a possible kidney stone Was issued oxyCODONE-acetaminophen (ROXICET) 5-325 MG per tablet   She states this is not touching her pain an would like something stronger She was advised she may need to bring in the original script to be discarded  In order to get a stronger script

## 2014-11-18 NOTE — Discharge Instructions (Signed)
Stop taking your percocet that was prescribed yesterday. Follow up as discussed. If you develop a fever you need to be seen Hypoglycemia Hypoglycemia occurs when the glucose in your blood is too low. Glucose is a type of sugar that is your body's main energy source. Hormones, such as insulin and glucagon, control the level of glucose in the blood. Insulin lowers blood glucose and glucagon increases blood glucose. Having too much insulin in your blood stream, or not eating enough food containing sugar, can result in hypoglycemia. Hypoglycemia can happen to people with or without diabetes. It can develop quickly and can be a medical emergency.  CAUSES   Missing or delaying meals.  Not eating enough carbohydrates at meals.  Taking too much diabetes medicine.  Not timing your oral diabetes medicine or insulin doses with meals, snacks, and exercise.  Nausea and vomiting.  Certain medicines.  Severe illnesses, such as hepatitis, kidney disorders, and certain eating disorders.  Increased activity or exercise without eating something extra or adjusting medicines.  Drinking too much alcohol.  A nerve disorder that affects body functions like your heart rate, blood pressure, and digestion (autonomic neuropathy).  A condition where the stomach muscles do not function properly (gastroparesis). Therefore, medicines and food may not absorb properly.  Rarely, a tumor of the pancreas can produce too much insulin. SYMPTOMS   Hunger.  Sweating (diaphoresis).  Change in body temperature.  Shakiness.  Headache.  Anxiety.  Lightheadedness.  Irritability.  Difficulty concentrating.  Dry mouth.  Tingling or numbness in the hands or feet.  Restless sleep or sleep disturbances.  Altered speech and coordination.  Change in mental status.  Seizures or prolonged convulsions.  Combativeness.  Drowsiness (lethargic).  Weakness.  Increased heart rate or  palpitations.  Confusion.  Pale, gray skin color.  Blurred or double vision.  Fainting. DIAGNOSIS  A physical exam and medical history will be performed. Your caregiver may make a diagnosis based on your symptoms. Blood tests and other lab tests may be performed to confirm a diagnosis. Once the diagnosis is made, your caregiver will see if your signs and symptoms go away once your blood glucose is raised.  TREATMENT  Usually, you can easily treat your hypoglycemia when you notice symptoms.  Check your blood glucose. If it is less than 70 mg/dl, take one of the following:   3-4 glucose tablets.    cup juice.    cup regular soda.   1 cup skim milk.   -1 tube of glucose gel.   5-6 hard candies.   Avoid high-fat drinks or food that may delay a rise in blood glucose levels.  Do not take more than the recommended amount of sugary foods, drinks, gel, or tablets. Doing so will cause your blood glucose to go too high.   Wait 10-15 minutes and recheck your blood glucose. If it is still less than 70 mg/dl or below your target range, repeat treatment.   Eat a snack if it is more than 1 hour until your next meal.  There may be a time when your blood glucose may go so low that you are unable to treat yourself at home when you start to notice symptoms. You may need someone to help you. You may even faint or be unable to swallow. If you cannot treat yourself, someone will need to bring you to the hospital.  HOME CARE INSTRUCTIONS  If you have diabetes, follow your diabetes management plan by:  Taking your medicines as  directed.  Following your exercise plan.  Following your meal plan. Do not skip meals. Eat on time.  Testing your blood glucose regularly. Check your blood glucose before and after exercise. If you exercise longer or different than usual, be sure to check blood glucose more frequently.  Wearing your medical alert jewelry that says you have  diabetes.  Identify the cause of your hypoglycemia. Then, develop ways to prevent the recurrence of hypoglycemia.  Do not take a hot bath or shower right after an insulin shot.  Always carry treatment with you. Glucose tablets are the easiest to carry.  If you are going to drink alcohol, drink it only with meals.  Tell friends or family members ways to keep you safe during a seizure. This may include removing hard or sharp objects from the area or turning you on your side.  Maintain a healthy weight. SEEK MEDICAL CARE IF:   You are having problems keeping your blood glucose in your target range.  You are having frequent episodes of hypoglycemia.  You feel you might be having side effects from your medicines.  You are not sure why your blood glucose is dropping so low.  You notice a change in vision or a new problem with your vision. SEEK IMMEDIATE MEDICAL CARE IF:   Confusion develops.  A change in mental status occurs.  The inability to swallow develops.  Fainting occurs. Document Released: 09/24/2005 Document Revised: 09/29/2013 Document Reviewed: 01/21/2012 [facility] Patient Information 2015 [place], [place]. This information is not intended to replace advice given to you by your health care provider. Make sure you discuss any questions you have with your health care provider.  Kidney Stones Kidney stones (urolithiasis) are solid masses that form inside your kidneys. The intense pain is caused by the stone moving through the kidney, ureter, bladder, and urethra (urinary tract). When the stone moves, the ureter starts to spasm around the stone. The stone is usually passed in your pee (urine).  HOME CARE  Drink enough fluids to keep your pee clear or pale yellow. This helps to get the stone out.  Strain all pee through the provided strainer. Do not pee without peeing through the strainer, not even once. If you pee the stone out, catch it in the strainer. The stone may be as  small as a grain of salt. Take this to your doctor. This will help your doctor figure out what you can do to try to prevent more kidney stones.  Only take medicine as told by your doctor.  Follow up with your doctor as told.  Get follow-up X-rays as told by your doctor. GET HELP IF: You have pain that gets worse even if you have been taking pain medicine. GET HELP RIGHT AWAY IF:   Your pain does not get better with medicine.  You have a fever or shaking chills.  Your pain increases and gets worse over 18 hours.  You have new belly (abdominal) pain.  You feel faint or pass out.  You are unable to pee. MAKE SURE YOU:   Understand these instructions.  Will watch your condition.  Will get help right away if you are not doing well or get worse. Document Released: 03/12/2008 Document Revised: 05/27/2013 Document Reviewed: 02/25/2013 [facility] Patient Information 2015 [place], [place]. This information is not intended to replace advice given to you by your health care provider. Make sure you discuss any questions you have with your health care provider.

## 2014-11-18 NOTE — ED Notes (Signed)
Pt stated she felt like her "sugar was low", CBG 41- patient drank 240cc orange juice with crackers and peanut butter. Recheck at 1355 yielded CBG 52. [place], PA informed. Orders for Glucagon 1mg  IM received and given.

## 2014-11-18 NOTE — ED Provider Notes (Signed)
CSN: [id number]     Arrival date & time 11/18/14  1047 History   First MD Initiated Contact with Patient 11/18/14 1114     Chief Complaint  Patient presents with  . Flank Pain     (Consider location/radiation/quality/duration/timing/severity/associated sxs/prior Treatment) HPI Comments: Was seen by her pcp and told likely stone. Pt was given oxycodone without relief  Patient is a 44 y.o. female presenting with flank pain. The history is provided by the patient. No language interpreter was used.  Flank Pain This is a new problem. The current episode started yesterday. The problem occurs constantly. The problem has been unchanged. Associated symptoms include abdominal pain and nausea. Pertinent negatives include no fever or vomiting. Nothing aggravates the symptoms. She has tried oral narcotics for the symptoms.    Past Medical History  Diagnosis Date  . Pancreatitis   . Diabetes mellitus   . Esophageal ulcer 1997  . Depression   . Anxiety   . Migraine headache   . Bipolar disorder     bipolar was a misdiagnoses   . Glucose intolerance (impaired glucose tolerance)   . Chronic pancreatitis   . Kidney stones   . Chronic abdominal pain   . Chronic pain     has pain specialist  . Chronic constipation   . Fatty liver    Past Surgical History  Procedure Laterality Date  . Whipple procedure    . Intestinal blockage    . Abdominal surgery    . Abdominal hysterectomy    . Cholecystectomy    . Cesarean section    . Colonoscopy     Family History  Problem Relation Age of Onset  . Diabetes Father    History  Substance Use Topics  . Smoking status: Never Smoker   . Smokeless tobacco: Not on file  . Alcohol Use: No   OB History    Gravida Para Term Preterm AB TAB SAB Ectopic Multiple Living            2     Review of Systems  Constitutional: Negative for fever.  Gastrointestinal: Positive for nausea and abdominal pain. Negative for vomiting.  Genitourinary: Positive  for flank pain.  All other systems reviewed and are negative.     Allergies  Buspar and Metoclopramide hcl  Home Medications   Prior to Admission medications   Medication Sig Start Date End Date Taking? Authorizing Provider  Insulin Glargine (LANTUS SOLOSTAR) 100 UNIT/ML Solostar Pen Inject 22 Units into the skin at bedtime.    Historical Provider, MD  insulin lispro (HUMALOG) 100 UNIT/ML injection Inject 0-9 Units into the skin 3 (three) times daily. 0-9 Units, Subcutaneous, 3 times daily with meals, CBG < 70: Drink or eat something sweet and recheck blood sugar. CBG 70 - 120: 0 units CBG 121 - 150: 1 unit CBG 151 - 200: 2 units CBG 201 - 250: 3 units CBG 251 - 300: 5 units CBG 301 - 350: 7 units CBG 351 - 400: 9 units CBG > 400: call MD 06/05/13   Merlyn AlbertWilliam S Luking, MD  omeprazole (PRILOSEC) 20 MG capsule Take 1 capsule (20 mg total) by mouth 2 (two) times daily. 08/31/14   Babs SciaraScott A Luking, MD  ondansetron (ZOFRAN ODT) 4 MG disintegrating tablet Take 1 tablet (4 mg total) by mouth every 8 (eight) hours as needed for nausea. 06/02/14   Rolland PorterMark James, MD  oxyCODONE-acetaminophen (ROXICET) 5-325 MG per tablet Take 1 tablet by mouth every 6 (six) hours  as needed for severe pain. Use sparingly 11/17/14   Merlyn Albert, MD  promethazine (PHENERGAN) 50 MG tablet Take 1 tablet (50 mg total) by mouth every 6 (six) hours as needed for nausea or vomiting. 11/17/14   Merlyn Albert, MD  zolpidem (AMBIEN) 10 MG tablet  09/27/14   Historical Provider, MD   BP 127/88 mmHg  Pulse 103  Temp(Src) 99.3 F (37.4 C) (Oral)  Resp 18  Ht  (1.6 m)  Wt 132 lb (59.875 kg)  BMI 23.39 kg/m2  SpO2 100% Physical Exam  Constitutional: She is oriented to person, place, and time. She appears well-developed and well-nourished.  Cardiovascular: Normal rate and regular rhythm.   Pulmonary/Chest: Effort normal and breath sounds normal.  Abdominal: Soft. Bowel sounds are normal. There is no tenderness.   Musculoskeletal: Normal range of motion.  Neurological: She is alert and oriented to person, place, and time. Coordination normal.  Skin: Skin is warm and dry.  Nursing note and vitals reviewed.   ED Course  Procedures (including critical care time) Labs Review Labs Reviewed  URINALYSIS, ROUTINE W REFLEX MICROSCOPIC - Abnormal; Notable for the following:    Specific Gravity, Urine >1.030 (*)    Glucose, UA 250 (*)    Hgb urine dipstick LARGE (*)    All other components within normal limits  COMPREHENSIVE METABOLIC PANEL - Abnormal; Notable for the following:    Sodium 134 (*)    Glucose, Bld 239 (*)    Creatinine, Ser 0.47 (*)    All other components within normal limits  URINE MICROSCOPIC-ADD ON - Abnormal; Notable for the following:    Squamous Epithelial / LPF MANY (*)    Bacteria, UA FEW (*)    All other components within normal limits  CBG MONITORING, ED - Abnormal; Notable for the following:    Glucose-Capillary 41 (*)    All other components within normal limits  CBG MONITORING, ED - Abnormal; Notable for the following:    Glucose-Capillary 52 (*)    All other components within normal limits  CBC WITH DIFFERENTIAL/PLATELET    Imaging Review Ct Renal Stone Study  11/18/2014   CLINICAL DATA:  Left flank pain x2 days, history of kidney stones, status post Whipple procedure  EXAM: CT ABDOMEN AND PELVIS WITHOUT CONTRAST  TECHNIQUE: Multidetector CT imaging of the abdomen and pelvis was performed following the standard protocol without IV contrast.  COMPARISON:  10/30/2013  FINDINGS: Postsurgical change related Whipple procedure.  Lower chest:  Lung bases are clear.  Hepatobiliary: Unenhanced liver is notable for pneumobilia but is otherwise grossly unremarkable.  Status post cholecystectomy. 4.2 x 4.4 cm well-defined fluid collection in the gallbladder fossa, unchanged, likely reflecting a postoperative seroma.  Pancreas: Status post resection of the pancreatic head/uncinate  process. Residual pancreatic body/tail is unremarkable.  Spleen: Spleen is within normal limits  Adrenals/Urinary Tract: Adrenal glands are unremarkable.  5 mm calculus in the proximal left ureter (series 3/image 60). 2 mm nonobstructing right lower pole renal calculus. No hydronephrosis.  Bladder is mildly thick-walled but underdistended.  Stomach/Bowel: Residual stomach is grossly unremarkable.  Patent gastrojejunostomy.  No evidence of bowel obstruction.  Normal appendix.  Vascular/Lymphatic: No evidence of abdominal aortic aneurysm.  No suspicious abdominopelvic lymphadenopathy.  Reproductive: Status post hysterectomy.  Left ovary is within normal limits.  4.5 x 3.4 cm right ovarian cyst/follicle.  Other: No abdominopelvic ascites.  Musculoskeletal: Visualized osseous structures are within normal limits.  IMPRESSION: Postsurgical changes related to Whipple  procedure.  5 mm calculus in the proximal left ureter.  No hydronephrosis.  4.4 cm fluid collection in the gallbladder fossa, unchanged, likely reflecting a postoperative seroma.   Electronically Signed   By: Charline Bills M.D.   On: 11/18/2014 13:53     EKG Interpretation None      MDM   Final diagnoses:  Ureteral stone  Hypoglycemia    2:11 PM Pt is having a hypoglycemic episode. Will given glucagon since pt doesn't have a line. Discussed stone findings with pt. Pt requesting something more for pain  3:43 PM Pt is feeling a lot better after the pain medication. Pt blood sugar is back up. Discussed return precautions. Switched percocet strength.  Teressa Lower, NP 11/18/14 1544  Glynn Octave, MD 11/18/14 [phone]

## 2014-11-18 NOTE — Telephone Encounter (Signed)
Discussed with patient. Patient advised to got to the ER. Patient verbalized understanding and stated she will go straight to ER for evaluation and treatment.

## 2014-11-18 NOTE — ED Notes (Signed)
CBG 271  

## 2014-11-21 LAB — CBG MONITORING, ED: Glucose-Capillary: 271 mg/dL — ABNORMAL HIGH (ref 70–99)

## 2014-11-29 ENCOUNTER — Telehealth: Payer: Self-pay | Admitting: Family Medicine

## 2014-11-29 DIAGNOSIS — N200 Calculus of kidney: Secondary | ICD-10-CM

## 2014-11-29 MED ORDER — OXYCODONE-ACETAMINOPHEN 10-325 MG PO TABS
1.0000 | ORAL_TABLET | Freq: Four times a day (QID) | ORAL | Status: DC | PRN
Start: 2014-11-29 — End: 2015-01-06

## 2014-11-29 NOTE — Telephone Encounter (Signed)
Patient would like a referral to neurologist and also refill on perocet 10 mg.

## 2014-11-29 NOTE — Addendum Note (Signed)
Addended by: Dereck LigasJOHNSON, Taralynn Quiett P on: 11/29/2014 11:14 AM   Modules accepted: Orders

## 2014-11-29 NOTE — Addendum Note (Signed)
Addended by: Dereck LigasJOHNSON, CALANDRA P on: 11/29/2014 10:32 AM   Modules accepted: Orders

## 2014-11-29 NOTE — Telephone Encounter (Signed)
Patient has not passed stone yet. Urgent referral to urology initiated in the system. Script for percocet ready for pickup. Patient was notified.

## 2014-11-29 NOTE — Telephone Encounter (Signed)
ntsw has she passed stone yet? With narcotic abuse hx, someone else helping monitor these meds?, likely wants UROlogis with recent stone, plz sched asap for obstructing stone, give num twenty oxycod 10/325 one q 6 prn

## 2014-12-03 DIAGNOSIS — N200 Calculus of kidney: Secondary | ICD-10-CM | POA: Diagnosis not present

## 2014-12-03 DIAGNOSIS — N201 Calculus of ureter: Secondary | ICD-10-CM | POA: Diagnosis not present

## 2014-12-06 ENCOUNTER — Ambulatory Visit: Payer: Medicare Other | Admitting: Nutrition

## 2014-12-06 ENCOUNTER — Encounter: Payer: Self-pay | Admitting: Nurse Practitioner

## 2014-12-06 ENCOUNTER — Ambulatory Visit (INDEPENDENT_AMBULATORY_CARE_PROVIDER_SITE_OTHER): Payer: Medicare Other | Admitting: Nurse Practitioner

## 2014-12-06 VITALS — BP 118/70 | Temp 98.7°F | Ht 63.0 in | Wt 143.0 lb

## 2014-12-06 DIAGNOSIS — B9689 Other specified bacterial agents as the cause of diseases classified elsewhere: Secondary | ICD-10-CM

## 2014-12-06 DIAGNOSIS — J00 Acute nasopharyngitis [common cold]: Secondary | ICD-10-CM | POA: Diagnosis not present

## 2014-12-06 DIAGNOSIS — J069 Acute upper respiratory infection, unspecified: Secondary | ICD-10-CM

## 2014-12-06 DIAGNOSIS — J208 Acute bronchitis due to other specified organisms: Secondary | ICD-10-CM

## 2014-12-06 MED ORDER — HYDROCODONE-HOMATROPINE 5-1.5 MG/5ML PO SYRP: 5.0000 mL | ORAL_SOLUTION | ORAL | Status: DC | PRN

## 2014-12-06 MED ORDER — ALBUTEROL SULFATE HFA 108 (90 BASE) MCG/ACT IN AERS: 2.0000 | INHALATION_SPRAY | RESPIRATORY_TRACT | Status: DC | PRN

## 2014-12-06 MED ORDER — AZITHROMYCIN 250 MG PO TABS: ORAL_TABLET | ORAL | Status: DC

## 2014-12-09 ENCOUNTER — Encounter: Payer: Self-pay | Admitting: Nurse Practitioner

## 2014-12-09 NOTE — Progress Notes (Signed)
Subjective:  Presents for c/o sore throat,ear pain, and cough that began 5 days ago. Possible fever. Aches. Chills. Headache. Frequent nonproductive cough. Occasional wheeze/rattle in the chest. No vomiting diarrhea or abdominal pain. Taking fluids well. Voiding normal limit. Nonsmoker.  Objective:   BP 118/70 mmHg  Temp(Src) 98.7 F (37.1 C) (Oral)  Ht 5\' 3"  (1.6 m)  Wt 143 lb (64.864 kg)  BMI 25.34 kg/m2 NAD. Alert, oriented. TMs clear effusion, no erythema. Pharynx injected with PND noted. Neck supple with mild soft anterior adenopathy. Lungs scattered expiratory crackles with occasional expiratory rhonchi, rare expiratory wheeze. No tachypnea. Normal color. Heart regular rate rhythm.  Assessment: Bacterial upper respiratory infection  Acute bacterial bronchitis  Plan:  Meds ordered this encounter  Medications  . azithromycin (ZITHROMAX Z-PAK) 250 MG tablet    Sig: Take 2 tablets (500 mg) on  Day 1,  followed by 1 tablet (250 mg) once daily on Days 2 through 5.    Dispense:  6 each    Refill:  0    Order Specific Question:  Supervising Provider    Answer:  Merlyn AlbertLUKING, WILLIAM S [[id number]]  . HYDROcodone-homatropine (HYCODAN) 5-1.5 MG/5ML syrup    Sig: Take 5 mLs by mouth every 4 (four) hours as needed.    Dispense:  120 mL    Refill:  0    Order Specific Question:  Supervising Provider    Answer:  Merlyn AlbertLUKING, WILLIAM S [[id number]]  . albuterol (PROVENTIL HFA;VENTOLIN HFA) 108 (90 BASE) MCG/ACT inhaler    Sig: Inhale 2 puffs into the lungs every 4 (four) hours as needed.    Dispense:  1 Inhaler    Refill:  0    Order Specific Question:  Supervising Provider    Answer:  Merlyn AlbertLUKING, WILLIAM S [[id number]]  . tamsulosin (FLOMAX) 0.4 MG CAPS capsule    Sig:    OTC meds as directed for congestion. Call back in 72 hours if no improvement, sooner if worse.

## 2014-12-15 ENCOUNTER — Telehealth: Payer: Self-pay | Admitting: Family Medicine

## 2014-12-15 ENCOUNTER — Other Ambulatory Visit: Payer: Self-pay | Admitting: Urology

## 2014-12-15 NOTE — Telephone Encounter (Signed)
Hycodan last given 12/06/14

## 2014-12-15 NOTE — Telephone Encounter (Signed)
I do not feel comfortable refilling this. Send this message to Dr. Brett CanalesSteve patient. Patient just had some filled approximate 9 days ago. Not a good medicine for long-term use

## 2014-12-15 NOTE — Telephone Encounter (Signed)
Pt is requesting a refill on cough medicine.  [facility]

## 2014-12-16 ENCOUNTER — Encounter (HOSPITAL_BASED_OUTPATIENT_CLINIC_OR_DEPARTMENT_OTHER): Payer: Self-pay | Admitting: *Deleted

## 2014-12-16 NOTE — Progress Notes (Signed)
NPO AFTER MN. ARRIVE AT 1030. NEEDS ISTAT. CURRENT EKG IN CHART AND EPIC. WILL TAKE PRILOSEC AND FLOMAX AM DOS W/ SIPS OF WATER AND IF NEEDED TAKE PAIN/ NAUSEA RX.

## 2014-12-20 ENCOUNTER — Encounter (HOSPITAL_BASED_OUTPATIENT_CLINIC_OR_DEPARTMENT_OTHER): Payer: Self-pay

## 2014-12-20 ENCOUNTER — Ambulatory Visit (HOSPITAL_BASED_OUTPATIENT_CLINIC_OR_DEPARTMENT_OTHER)
Admission: RE | Admit: 2014-12-20 | Discharge: 2014-12-20 | Disposition: A | Discharge status: Home / Self Care | Payer: Medicare Other | Source: Ambulatory Visit | Attending: Urology | Admitting: Urology

## 2014-12-20 ENCOUNTER — Ambulatory Visit (HOSPITAL_BASED_OUTPATIENT_CLINIC_OR_DEPARTMENT_OTHER): Payer: Medicare Other | Admitting: Anesthesiology

## 2014-12-20 ENCOUNTER — Ambulatory Visit (HOSPITAL_COMMUNITY): Payer: Medicare Other

## 2014-12-20 ENCOUNTER — Encounter (HOSPITAL_BASED_OUTPATIENT_CLINIC_OR_DEPARTMENT_OTHER)
Admission: RE | Disposition: A | Discharge status: Home / Self Care | Payer: Self-pay | Source: Ambulatory Visit | Attending: Urology

## 2014-12-20 DIAGNOSIS — Z79899 Other long term (current) drug therapy: Secondary | ICD-10-CM | POA: Diagnosis not present

## 2014-12-20 DIAGNOSIS — E119 Type 2 diabetes mellitus without complications: Secondary | ICD-10-CM | POA: Diagnosis not present

## 2014-12-20 DIAGNOSIS — Z90710 Acquired absence of both cervix and uterus: Secondary | ICD-10-CM | POA: Diagnosis not present

## 2014-12-20 DIAGNOSIS — Z90411 Acquired partial absence of pancreas: Secondary | ICD-10-CM | POA: Diagnosis not present

## 2014-12-20 DIAGNOSIS — G43909 Migraine, unspecified, not intractable, without status migrainosus: Secondary | ICD-10-CM | POA: Diagnosis not present

## 2014-12-20 DIAGNOSIS — G4700 Insomnia, unspecified: Secondary | ICD-10-CM | POA: Diagnosis not present

## 2014-12-20 DIAGNOSIS — Z841 Family history of disorders of kidney and ureter: Secondary | ICD-10-CM | POA: Diagnosis not present

## 2014-12-20 DIAGNOSIS — K219 Gastro-esophageal reflux disease without esophagitis: Secondary | ICD-10-CM | POA: Insufficient documentation

## 2014-12-20 DIAGNOSIS — Z79891 Long term (current) use of opiate analgesic: Secondary | ICD-10-CM | POA: Insufficient documentation

## 2014-12-20 DIAGNOSIS — N201 Calculus of ureter: Secondary | ICD-10-CM | POA: Diagnosis not present

## 2014-12-20 DIAGNOSIS — Z9049 Acquired absence of other specified parts of digestive tract: Secondary | ICD-10-CM | POA: Insufficient documentation

## 2014-12-20 DIAGNOSIS — F319 Bipolar disorder, unspecified: Secondary | ICD-10-CM | POA: Insufficient documentation

## 2014-12-20 DIAGNOSIS — N200 Calculus of kidney: Secondary | ICD-10-CM | POA: Insufficient documentation

## 2014-12-20 HISTORY — DX: Reserved for concepts with insufficient information to code with codable children: IMO0002

## 2014-12-20 HISTORY — DX: Irritable bowel syndrome, unspecified: K58.9

## 2014-12-20 HISTORY — DX: Personal history of other diseases of the circulatory system: Z86.79

## 2014-12-20 HISTORY — PX: HOLMIUM LASER APPLICATION: SHX5852

## 2014-12-20 HISTORY — DX: Personal history of other diseases of the digestive system: Z87.19

## 2014-12-20 HISTORY — DX: Major depressive disorder, recurrent, unspecified: F33.9

## 2014-12-20 HISTORY — DX: Other specified bacterial agents as the cause of diseases classified elsewhere: B96.89

## 2014-12-20 HISTORY — DX: Personal history of urinary calculi: Z87.442

## 2014-12-20 HISTORY — DX: Personal history of self-harm: Z91.5

## 2014-12-20 HISTORY — PX: CYSTOSCOPY WITH RETROGRADE PYELOGRAM, URETEROSCOPY AND STENT PLACEMENT: SHX5789

## 2014-12-20 HISTORY — DX: Calculus of ureter: N20.1

## 2014-12-20 HISTORY — DX: Personal history of suicidal behavior: Z91.51

## 2014-12-20 HISTORY — DX: Gastro-esophageal reflux disease without esophagitis: K21.9

## 2014-12-20 HISTORY — DX: Acute bronchitis due to other specified organisms: J20.8

## 2014-12-20 HISTORY — DX: Type 2 diabetes mellitus with hyperglycemia: E11.65

## 2014-12-20 LAB — POCT I-STAT 4, (NA,K, GLUC, HGB,HCT)
Glucose, Bld: 130 mg/dL — ABNORMAL HIGH (ref 70–99)
HCT: 46 % (ref 36.0–46.0)
Hemoglobin: 15.6 g/dL — ABNORMAL HIGH (ref 12.0–15.0)
Potassium: 4.5 mmol/L (ref 3.5–5.1)
Sodium: 139 mmol/L (ref 135–145)

## 2014-12-20 LAB — GLUCOSE, CAPILLARY: Glucose-Capillary: 104 mg/dL — ABNORMAL HIGH (ref 70–99)

## 2014-12-20 SURGERY — CYSTOURETEROSCOPY, WITH RETROGRADE PYELOGRAM AND STENT INSERTION
Anesthesia: General | Site: Ureter | Laterality: Left

## 2014-12-20 MED ORDER — CEFAZOLIN SODIUM-DEXTROSE 2-3 GM-% IV SOLR
2.0000 g | INTRAVENOUS | Status: AC
  Administered 2014-12-20: 2 g via INTRAVENOUS
  Filled 2014-12-20: qty 50

## 2014-12-20 MED ORDER — SCOPOLAMINE 1 MG/3DAYS TD PT72
1.0000 | MEDICATED_PATCH | TRANSDERMAL | Status: DC
  Administered 2014-12-20: 1.5 mg via TRANSDERMAL
  Filled 2014-12-20: qty 1

## 2014-12-20 MED ORDER — CEFAZOLIN SODIUM-DEXTROSE 2-3 GM-% IV SOLR
INTRAVENOUS | Status: AC
  Filled 2014-12-20: qty 50

## 2014-12-20 MED ORDER — HYDROMORPHONE HCL 1 MG/ML IJ SOLN
INTRAMUSCULAR | Status: AC
  Filled 2014-12-20: qty 1

## 2014-12-20 MED ORDER — SODIUM CHLORIDE 0.9 % IJ SOLN
INTRAMUSCULAR | Status: AC
  Filled 2014-12-20: qty 10

## 2014-12-20 MED ORDER — LACTATED RINGERS IV SOLN
INTRAVENOUS | Status: DC | PRN
  Administered 2014-12-20: 11:00:00 via INTRAVENOUS

## 2014-12-20 MED ORDER — ACETAMINOPHEN 10 MG/ML IV SOLN
INTRAVENOUS | Status: DC | PRN
  Administered 2014-12-20: 1000 mg via INTRAVENOUS

## 2014-12-20 MED ORDER — OXYCODONE HCL 5 MG/5ML PO SOLN
5.0000 mg | Freq: Once | ORAL | Status: AC | PRN
  Filled 2014-12-20: qty 5

## 2014-12-20 MED ORDER — OXYCODONE-ACETAMINOPHEN 5-325 MG PO TABS: 1.0000 | ORAL_TABLET | Freq: Four times a day (QID) | ORAL | Status: DC | PRN

## 2014-12-20 MED ORDER — PROMETHAZINE HCL 25 MG/ML IJ SOLN
INTRAMUSCULAR | Status: AC
  Filled 2014-12-20: qty 1

## 2014-12-20 MED ORDER — LACTATED RINGERS IV SOLN
INTRAVENOUS | Status: DC
  Administered 2014-12-20 (×2): via INTRAVENOUS
  Filled 2014-12-20: qty 1000

## 2014-12-20 MED ORDER — FENTANYL CITRATE 0.05 MG/ML IJ SOLN
INTRAMUSCULAR | Status: DC | PRN
  Administered 2014-12-20 (×8): 25 ug via INTRAVENOUS

## 2014-12-20 MED ORDER — HYDROMORPHONE HCL 1 MG/ML IJ SOLN
0.2500 mg | INTRAMUSCULAR | Status: DC | PRN
  Administered 2014-12-20 (×2): 0.25 mg via INTRAVENOUS
  Filled 2014-12-20: qty 1

## 2014-12-20 MED ORDER — PROPOFOL 10 MG/ML IV BOLUS
INTRAVENOUS | Status: DC | PRN
  Administered 2014-12-20: 200 mg via INTRAVENOUS

## 2014-12-20 MED ORDER — ONDANSETRON HCL 4 MG/2ML IJ SOLN
INTRAMUSCULAR | Status: DC | PRN
  Administered 2014-12-20: 4 mg via INTRAVENOUS

## 2014-12-20 MED ORDER — KETOROLAC TROMETHAMINE 30 MG/ML IJ SOLN
INTRAMUSCULAR | Status: DC | PRN
  Administered 2014-12-20: 30 mg via INTRAVENOUS

## 2014-12-20 MED ORDER — MIDAZOLAM HCL 5 MG/5ML IJ SOLN
INTRAMUSCULAR | Status: DC | PRN
  Administered 2014-12-20: 2 mg via INTRAVENOUS

## 2014-12-20 MED ORDER — SCOPOLAMINE 1 MG/3DAYS TD PT72
MEDICATED_PATCH | TRANSDERMAL | Status: AC
  Filled 2014-12-20: qty 1

## 2014-12-20 MED ORDER — PROMETHAZINE HCL 25 MG/ML IJ SOLN
6.2500 mg | INTRAMUSCULAR | Status: DC | PRN
  Administered 2014-12-20: 6.25 mg via INTRAVENOUS
  Filled 2014-12-20: qty 1

## 2014-12-20 MED ORDER — DEXAMETHASONE SODIUM PHOSPHATE 10 MG/ML IJ SOLN
INTRAMUSCULAR | Status: DC | PRN
  Administered 2014-12-20: 10 mg via INTRAVENOUS

## 2014-12-20 MED ORDER — FENTANYL CITRATE 0.05 MG/ML IJ SOLN
INTRAMUSCULAR | Status: AC
  Filled 2014-12-20: qty 6

## 2014-12-20 MED ORDER — OXYCODONE HCL 5 MG PO TABS
ORAL_TABLET | ORAL | Status: AC
  Filled 2014-12-20: qty 1

## 2014-12-20 MED ORDER — SODIUM CHLORIDE 0.9 % IR SOLN
Status: DC | PRN
  Administered 2014-12-20: 6000 mL via INTRAVESICAL

## 2014-12-20 MED ORDER — MIDAZOLAM HCL 2 MG/2ML IJ SOLN
INTRAMUSCULAR | Status: AC
  Filled 2014-12-20: qty 2

## 2014-12-20 MED ORDER — OXYCODONE HCL 5 MG PO TABS
5.0000 mg | ORAL_TABLET | Freq: Once | ORAL | Status: AC | PRN
  Administered 2014-12-20: 5 mg via ORAL
  Filled 2014-12-20: qty 1

## 2014-12-20 MED ORDER — LIDOCAINE HCL (CARDIAC) 20 MG/ML IV SOLN
INTRAVENOUS | Status: DC | PRN
  Administered 2014-12-20: 100 mg via INTRAVENOUS

## 2014-12-20 MED ORDER — CEFAZOLIN SODIUM 1-5 GM-% IV SOLN
1.0000 g | INTRAVENOUS | Status: DC
  Filled 2014-12-20: qty 50

## 2014-12-20 SURGICAL SUPPLY — 17 items
BAG DRAIN URO-CYSTO SKYTR STRL (DRAIN) ×4 IMPLANT
BAG DRN UROCATH (DRAIN) ×2
CANISTER SUCT LVC 12 LTR MEDI- (MISCELLANEOUS) ×2 IMPLANT
CATH INTERMIT  6FR 70CM (CATHETERS) ×2 IMPLANT
CATH URET 5FR 28IN OPEN ENDED (CATHETERS) ×2 IMPLANT
CLOTH BEACON ORANGE TIMEOUT ST (SAFETY) ×4 IMPLANT
FIBER LASER FLEXIVA 365 (UROLOGICAL SUPPLIES) ×2 IMPLANT
GLOVE BIO SURGEON STRL SZ7.5 (GLOVE) ×4 IMPLANT
GLOVE BIOGEL PI IND STRL 7.5 (GLOVE) IMPLANT
GLOVE BIOGEL PI INDICATOR 7.5 (GLOVE) ×2
GLOVE INDICATOR 7.5 STRL GRN (GLOVE) ×2 IMPLANT
GOWN STRL REUS W/TWL LRG LVL3 (GOWN DISPOSABLE) ×2 IMPLANT
GOWN STRL REUS W/TWL XL LVL3 (GOWN DISPOSABLE) ×2 IMPLANT
GUIDEWIRE STR DUAL SENSOR (WIRE) ×2 IMPLANT
IV NS IRRIG 3000ML ARTHROMATIC (IV SOLUTION) ×8 IMPLANT
NS IRRIG 500ML POUR BTL (IV SOLUTION) ×2 IMPLANT
PACK CYSTO (CUSTOM PROCEDURE TRAY) ×4 IMPLANT

## 2014-12-20 NOTE — Interval H&P Note (Signed)
History and Physical Interval Note:  12/20/2014 12:00 PM  [name] [name]  has presented today for surgery, with the diagnosis of LEFT URETERAL CALCULUS  The various methods of treatment have been discussed with the patient and family. After consideration of risks, benefits and other options for treatment, the patient has consented to  Procedure(s): CYSTOSCOPY WITH RETROGRADE PYELOGRAM, URETEROSCOPY AND STENT PLACEMENT (Left) HOLMIUM LASER APPLICATION (Left) as a surgical intervention .  The patient's history has been reviewed, patient examined, no change in status, stable for surgery.  I have reviewed the patient's chart and labs.  Questions were answered to the patient's satisfaction.     Shlomo Seres S

## 2014-12-20 NOTE — Transfer of Care (Signed)
Immediate Anesthesia Transfer of Care Note  Patient: [name] [name]  Procedure(s) Performed: Procedure(s) (LRB): CYSTOSCOPY WITH RETROGRADE PYELOGRAM, URETEROSCOPY (Left) HOLMIUM LASER APPLICATION (Left)  Patient Location: PACU  Anesthesia Type: General  Level of Consciousness: awake, sedated, patient cooperative and responds to stimulation  Airway & Oxygen Therapy: Patient Spontanous Breathing and Patient connected to face mask oxygen  Post-op Assessment: Report given to PACU RN, Post -op Vital signs reviewed and stable and Patient moving all extremities  Post vital signs: Reviewed and stable  Complications: No apparent anesthesia complications

## 2014-12-20 NOTE — Anesthesia Preprocedure Evaluation (Addendum)
Anesthesia Evaluation  Patient identified by MRN, date of birth, ID band Patient awake    Reviewed: Allergy & Precautions, NPO status , Patient's Chart, lab work & pertinent test results  Airway Mallampati: II  TM Distance: >3 FB Neck ROM: Full    Dental  (+) Teeth Intact, Dental Advisory Given   Pulmonary neg pulmonary ROS,  breath sounds clear to auscultation        Cardiovascular negative cardio ROS  Rhythm:Regular Rate:Normal     Neuro/Psych  Headaches,    GI/Hepatic Neg liver ROS, GERD-  Medicated and Controlled,  Endo/Other  diabetes (s/p Whipple), Insulin Dependent  Renal/GU L ureteral stone     Musculoskeletal   Abdominal   Peds  Hematology negative hematology ROS (+)   Anesthesia Other Findings   Reproductive/Obstetrics                            Anesthesia Physical Anesthesia Plan  ASA: II  Anesthesia Plan: General   Post-op Pain Management:    Induction: Intravenous  Airway Management Planned: LMA and Oral ETT  Additional Equipment:   Intra-op Plan:   Post-operative Plan: Extubation in OR  Informed Consent: I have reviewed the patients History and Physical, chart, labs and discussed the procedure including the risks, benefits and alternatives for the proposed anesthesia with the patient or authorized representative who has indicated his/her understanding and acceptance.   Dental advisory given  Plan Discussed with: CRNA  Anesthesia Plan Comments:         Anesthesia Quick Evaluation

## 2014-12-20 NOTE — Discharge Instructions (Signed)

## 2014-12-20 NOTE — H&P (Signed)
History of Present Illness   [name] [name] presents today as a referral from Dr Lubertha SouthSteve Luking for further assessment and potential treatment of a 5 mm left proximal ureteral stone. [name] [name] is currently 44 years of age but has quite a complex past medical history. She tells me she had longstanding idiopathic chronic pancreatitis and eventually underwent [facility]. Prior to that she was diabetic. She remains diabetic at this time. She tells me she has had 2 prior episodes of nephrolithiasis. Both those stones passed spontaneously. She does not recall stone composition. Several weeks ago, she began experiencing left-sided renal colic. CT [facility] revealed a 5 mm proximal left ureteral stone and a nonobstructing small 2 mm stone in the lower pole of her right kidney. She has continued to have intermittent discomfort and does not believe the stone has passed. Urinalysis today showed some ongoing minimal microhematuria. Urine pH is 6.0.     Past Medical History Problems  1. History of Anxiety (F41.9) 2. History of Bipolar disorder (F31.9) 3. History of constipation (Z87.19) 4. History of esophageal reflux (Z87.19) 5. History of gastric ulcer (Z87.19) 6. History of hypotension (Z86.79) 7. History of insomnia (Z87.898) 8. History of migraine headaches (Z86.69) 9. History of nephrolithiasis (Z87.442) 10. History of pancreatitis (Z87.19) 11. History of type 2 diabetes mellitus (Z86.39) 12. History of Hyponatremia (E87.1) 13. History of Liver lesion (K76.9) 14. History of Subarachnoid bleed (I60.9)  Surgical History Problems  1. History of Abdominal Surgery 2. History of Cholecystectomy 3. History of Hysterectomy 4. History of Proximal Subtotal Pancreatectomy (Whipple Procedure)  Current Meds 1. Omeprazole 20 MG Oral Capsule Delayed Release;  Therapy: (Recorded:25Feb2016) to Recorded 2. Oxycodone-Acetaminophen 10-325 MG Oral Tablet;  Therapy: (Recorded:25Feb2016) to  Recorded 3. Phenergan 50 MG TABS;  Therapy: (Recorded:25Feb2016) to Recorded 4. Zolpidem Tartrate 10 MG Oral Tablet;  Therapy: (Recorded:25Feb2016) to Recorded  Allergies Medication  1. Reglan TABS 2. BuSpar TABS 3. metoclopramide  Family History Problems  1. Family history of kidney stones (Z84.1) : Mother, Father, Sister  Social History Problems    Caffeine use (F15.90)   Married   No alcohol use   Number of children   2 daughters  Review of Systems Genitourinary, constitutional, skin, eye, otolaryngeal, hematologic/lymphatic, cardiovascular, pulmonary, endocrine, musculoskeletal, gastrointestinal, neurological and psychiatric system(s) were reviewed and pertinent findings if present are noted and are otherwise negative.  Genitourinary: no urinary frequency, no dysuria and no hematuria.  Gastrointestinal: nausea, flank pain and abdominal pain.    Vitals Vital Signs [Data Includes: Last 1 Day]  Recorded: 26Feb2016 01:05PM  Height: 5 ft 3 in Weight: 142 lb  BMI Calculated: 25.15 BSA Calculated: 1.67 Blood Pressure: 125 / 80 Heart Rate: 120  Physical Exam Constitutional: Well nourished and well developed . No acute distress.  ENT:. The ears and nose are normal in appearance.  Neck: The appearance of the neck is normal and no neck mass is present.  Cardiovascular: Heart rate and rhythm are normal . No peripheral edema.  Abdomen: The abdomen is soft and nontender. No masses are palpated. No CVA tenderness. No hernias are palpable. No hepatosplenomegaly noted.  Skin: Normal skin turgor, no visible rash and no visible skin lesions.  Neuro/Psych:. Mood and affect are appropriate.    Results/Data Urine [Data Includes: Last 1 Day]   26Feb2016  COLOR YELLOW   APPEARANCE CLEAR   SPECIFIC GRAVITY 1.010   pH 6.0   GLUCOSE NEG mg/dL  BILIRUBIN NEG   KETONE TRACE  mg/dL  BLOOD TRACE   PROTEIN NEG mg/dL  UROBILINOGEN 0.2 mg/dL  NITRITE NEG   LEUKOCYTE ESTERASE NEG    SQUAMOUS EPITHELIAL/HPF FEW   WBC 0-2 WBC/hpf  RBC 3-6 RBC/hpf  BACTERIA RARE   CRYSTALS NONE SEEN   CASTS NONE SEEN    Assessment Assessed  1. Nephrolithiasis (N20.0) 2. Calculus of ureter (N20.1)  Plan Calculus of ureter  1. Start: Tamsulosin HCl - 0.4 MG Oral Capsule; TAKE 1 CAPSULE EVERY DAY 2. KUB; Status:Hold For - Appointment,Date of Service; Requested for:26Feb2016;  3. KUB; Status:Resulted - Requires Verification;   Done: 26Feb2016 12:00AM 4. Follow-up NP/PA [facility] Office  Follow-up  Status: Hold For - Appointment,Date of Service   Requested for: 26Feb2016 Nephrolithiasis  5. Changed: From  Oxycodone-Acetaminophen 10-325 MG Oral Tablet  To  Oxycodone-Acetaminophen 10-325 MG Oral Tablet TAKE 1 TABLET EVERY 4 TO 6  HOURS AS NEEDED FOR PAIN  Discussion/Summary   [name] [name] was diagnosed 2 weeks ago with a 5 mm proximal ureteral stone. I reviewed her CT images. The stone was somewhat difficult to see on the scout portion of the CT, but it is unlikely that she has uric acid nephrolithiasis based on a pH of 6.0. On KUB imaging today, she has quite a bit of bowel gas in the upper quadrant of her abdomen, making definitive visualization of the stone difficult. It is unclear whether the stone is still present or not, but I suspect it is, given her ongoing discomfort and minimal microhematuria. She is told that a 5 mm stone ought to have about a 50% chance of passing spontaneously. There is no absolute indication for any urgent intervention. She is told at this point, it is certainly reasonable to consider intervention, but it is also very reasonable to continue to try to pass the stone spontaneously for several more weeks. She is interested in giving this some additional time. We will go ahead and start her on an alpha-blocker, which may improve the chances of her passing the stone and be sure she has a urine strainer. We will plan on followup her in 2-3 weeks with our nurse practitioner. We  can try another KUB to see if we have better visualization at that time. If she passes a stone, we will be happy to have it analyzed and talk about prevention. If after several more weeks, she has not passed a stone, I would recommend ureteroscopy. Lithotripsy is not really an option, given the stones poor visualization, but obviously if we can see it on followup imaging, that may be an option as well. She is told to present to [facility] Emergency Room if the situation worsens for consideration for more immediate treatment.   cc: [name], MD   Signatures Electronically signed by : Barron Alvine, M.D.; Dec 06 2014  8:03AM EST

## 2014-12-20 NOTE — Anesthesia Postprocedure Evaluation (Signed)
  Anesthesia Post-op Note  Patient: [name] [name]  Procedure(s) Performed: Procedure(s): CYSTOSCOPY, LEFT URETEROSCOPY, STONE REMOVAL, LASER LITHOTRIPSY (Left) HOLMIUM LASER APPLICATION (Left)  Patient Location: PACU  Anesthesia Type:General  Level of Consciousness: awake and alert   Airway and Oxygen Therapy: Patient Spontanous Breathing  Post-op Pain: none  Post-op Assessment: Post-op Vital signs reviewed  Post-op Vital Signs: Reviewed  Last Vitals:  Filed Vitals:   12/20/14 1345  BP: 110/66  Pulse: 73  Temp:   Resp: 14    Complications: No apparent anesthesia complications

## 2014-12-20 NOTE — Op Note (Signed)
Preoperative diagnosis: Left ureteral calculus Postoperative diagnosis: Same  Procedure: Cystoscopy, left ureteroscopy with holmium laser lithotripsy   Surgeon: Valetta Fulleravid S. Mackay Hanauer M.D.  Anesthesia: Gen.  Indications: Patient was recently referred with a new diagnosis of a 5 mm left proximal ureteral stone. We discussed options at that time and she elected continued efforts at attempts at spontaneous passage. She subsequently called with ongoing discomfort and requested intervention. The stone was very difficult to appreciate on KUB imaging and therefore we recommended ureteroscopy as the treatment of choice. Full risks and benefits of procedure were discussed with her at her initial visit as well as on follow-up discussion. Patient presents now for definitive management. She is continued to have moderate ongoing pain and nausea.     Technique and findings: Patient was brought the operating room where she had successful induction of general anesthesia. She was pleasant lithotomy position and prepped and draped in usual manner. She received perioperative antibiotics. An appropriate surgical timeout was performed. Cystoscopically her urethra was unremarkable. Upon entering the bladder one could see a portion of the stone starting to crown from the left ureteral orifice. There was mild edema around the left hemitrigone but nothing that was severe. Utilizing an open-ended catheter for additional guidance a guidewire was able to be passed beyond the stone and into the left renal pelvis as confirmed on fluoroscopic imaging. We then used a needle tip rigid ureteroscope to engage the stone. A holmium laser lithotripter fiber was utilized at 0.5 J 5 Hz. The stone was broken up into approximately a dozen pieces. All the pieces flushed. There had been no need for ureteral dilation. There was some mucosal inflammation right at the ureteral orifice but there did not appear to be significant inflammatory change. I elected  not to place a double-J stent and the guidewire was removed. Small fragments were removed for stone analysis. The patient was brought to recovery room in stable condition having had no obvious complications or problems.

## 2014-12-21 ENCOUNTER — Encounter (HOSPITAL_BASED_OUTPATIENT_CLINIC_OR_DEPARTMENT_OTHER): Payer: Self-pay | Admitting: Urology

## 2014-12-23 ENCOUNTER — Other Ambulatory Visit: Payer: Self-pay | Admitting: Family Medicine

## 2014-12-24 NOTE — Telephone Encounter (Signed)
This +1 refill. Patient needs office visit for med check

## 2014-12-25 ENCOUNTER — Other Ambulatory Visit: Payer: Self-pay | Admitting: Family Medicine

## 2014-12-29 ENCOUNTER — Other Ambulatory Visit: Payer: Self-pay | Admitting: Family Medicine

## 2015-01-03 ENCOUNTER — Other Ambulatory Visit: Payer: Self-pay | Admitting: Family Medicine

## 2015-01-06 ENCOUNTER — Ambulatory Visit (INDEPENDENT_AMBULATORY_CARE_PROVIDER_SITE_OTHER): Payer: Medicare Other | Admitting: Family Medicine

## 2015-01-06 ENCOUNTER — Encounter: Payer: Self-pay | Admitting: Family Medicine

## 2015-01-06 VITALS — BP 140/88 | Ht 63.0 in | Wt 141.4 lb

## 2015-01-06 DIAGNOSIS — G4700 Insomnia, unspecified: Secondary | ICD-10-CM

## 2015-01-06 DIAGNOSIS — G43001 Migraine without aura, not intractable, with status migrainosus: Secondary | ICD-10-CM

## 2015-01-06 MED ORDER — TOPIRAMATE 25 MG PO TABS: ORAL_TABLET | ORAL | Status: DC

## 2015-01-06 MED ORDER — ZOLPIDEM TARTRATE 10 MG PO TABS: ORAL_TABLET | ORAL | Status: DC

## 2015-01-06 MED ORDER — TOPIRAMATE 50 MG PO TABS
50.0000 mg | ORAL_TABLET | Freq: Two times a day (BID) | ORAL | Status: DC
Start: 2015-01-06 — End: 2015-02-07

## 2015-01-06 MED ORDER — RIZATRIPTAN BENZOATE 10 MG PO TBDP: 10.0000 mg | ORAL_TABLET | ORAL | Status: DC | PRN

## 2015-01-06 NOTE — Progress Notes (Signed)
   Subjective:    Patient ID: [name] [name], female    DOB: Dec 15, 1970, 44 y.o.   MRN: [id number]  HPI. Getting migraine headaches Patient arrives for a follow up on migraines    And wonders if she has an abd hernia.hx of abd surg, has develloped a squishy area,      Patient also needs refill of sleep med. States the Ambien is helping some. Lunesta worked better in the past.  . Patient would like to go back on Topamax for headaches. Definitely helped prevent migraines. Now expansion several bad once per month. Also notes Maxalt used when necessary is very helpful   Review of Systems No chest pain no abdominal discomfort other than noted above no back pain no weight loss ROS otherwise negative    Objective:   Physical Exam  Alert mild malaise. Vital stable HEENT normal neck supple. Lungs clear. Heart regular rhythm upper mid abdomen small abdominal wall defect palpable      Assessment & Plan:  Impression migraine headaches discussed candidate for both primary and secondary prophylaxis #2 abdominal wall l hernia discuss #3 insomnia discussed plan refill Ambien. Get back with surgeon. Medications prescribed. WSL

## 2015-02-02 ENCOUNTER — Other Ambulatory Visit: Payer: Self-pay | Admitting: Family Medicine

## 2015-02-02 DIAGNOSIS — E1065 Type 1 diabetes mellitus with hyperglycemia: Secondary | ICD-10-CM | POA: Diagnosis not present

## 2015-02-02 DIAGNOSIS — K868 Other specified diseases of pancreas: Secondary | ICD-10-CM | POA: Diagnosis not present

## 2015-02-02 DIAGNOSIS — Z9119 Patient's noncompliance with other medical treatment and regimen: Secondary | ICD-10-CM | POA: Diagnosis not present

## 2015-02-07 ENCOUNTER — Ambulatory Visit (INDEPENDENT_AMBULATORY_CARE_PROVIDER_SITE_OTHER): Payer: Medicare Other | Admitting: Family Medicine

## 2015-02-07 ENCOUNTER — Encounter: Payer: Self-pay | Admitting: Family Medicine

## 2015-02-07 ENCOUNTER — Other Ambulatory Visit: Payer: Self-pay | Admitting: Family Medicine

## 2015-02-07 VITALS — BP 124/86 | Ht 63.0 in | Wt 145.0 lb

## 2015-02-07 DIAGNOSIS — G629 Polyneuropathy, unspecified: Secondary | ICD-10-CM

## 2015-02-07 DIAGNOSIS — K439 Ventral hernia without obstruction or gangrene: Secondary | ICD-10-CM

## 2015-02-07 DIAGNOSIS — G608 Other hereditary and idiopathic neuropathies: Secondary | ICD-10-CM

## 2015-02-07 MED ORDER — NORTRIPTYLINE HCL 25 MG PO CAPS: ORAL_CAPSULE | ORAL | Status: DC

## 2015-02-07 NOTE — Progress Notes (Signed)
   Subjective:    Patient ID: [name] [name], female    DOB: 21-Sep-1971, 44 y.o.   MRN: [id number]  HPIBilateral leg, foot, hip and back pain. Started awhile back but worse the past week. Burning and tingling in legs and feet for the past few months. Taking ibuprofen.   A lot of walking and standing and burning sensation now in legs and feet  Sugars overall good, A1C pending, numbers under 200, often less. Patient admits her A1c was too high this winter. It got up to 10.  Tingling numbness and burning worse in the feet and legs. Worse in the evening time. Starting to affect sleep ability.  Patient notes her migraine headaches have improved.  Trying to walk  Ibuprofen takes two evey few hrs as needed  Review of Systems No headache no chest pain no back pain no change in bowel habits    Objective:   Physical Exam Alert vital stable HET normal lungs clear heart regular in rhythm. Ankles without edema. Diminished sensation both feet. 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 Patient spoke with her specialist and did. They recommended a local regular surgeon. Plan Sur g rewf. Initiate nortriptyline daily at bedtime for neuropathy. Tight sugars very important. Discussed. Follow-up as scheduled WSL

## 2015-02-11 ENCOUNTER — Other Ambulatory Visit: Payer: Self-pay | Admitting: *Deleted

## 2015-02-11 ENCOUNTER — Telehealth: Payer: Self-pay | Admitting: Family Medicine

## 2015-02-11 MED ORDER — NORTRIPTYLINE HCL 25 MG PO CAPS: ORAL_CAPSULE | ORAL | Status: DC

## 2015-02-11 NOTE — Telephone Encounter (Signed)
Pt called stating that she was recently put on a med for neuropathy and states That it is not working. Pt states that she woke this morning to pains shooting from Her lower back down her legs.

## 2015-02-11 NOTE — Telephone Encounter (Signed)
Patient was prescribed nortriptyline on 02/07/15 for neuropathy.

## 2015-02-11 NOTE — Telephone Encounter (Signed)
I would recommend bumping up her nortriptyline dose to 3 capsules at nighttime. This is a good medication. Typically have to find the right dose to help area if that is not improving things over the next week call back to see what Dr. Brett CanalesSteve has to say. If needs a new prescription to cover 3 per day please do so with 2 refills

## 2015-02-11 NOTE — Telephone Encounter (Signed)
Discussed with pt. Med sent to pharm.  

## 2015-03-04 DIAGNOSIS — K439 Ventral hernia without obstruction or gangrene: Secondary | ICD-10-CM | POA: Diagnosis not present

## 2015-03-24 ENCOUNTER — Other Ambulatory Visit: Payer: Self-pay | Admitting: Family Medicine

## 2015-03-28 DIAGNOSIS — K432 Incisional hernia without obstruction or gangrene: Secondary | ICD-10-CM | POA: Diagnosis not present

## 2015-04-02 ENCOUNTER — Other Ambulatory Visit: Payer: Self-pay | Admitting: Family Medicine

## 2015-04-19 DIAGNOSIS — Z9889 Other specified postprocedural states: Secondary | ICD-10-CM | POA: Diagnosis not present

## 2015-04-19 DIAGNOSIS — Z794 Long term (current) use of insulin: Secondary | ICD-10-CM | POA: Diagnosis not present

## 2015-04-19 DIAGNOSIS — E108 Type 1 diabetes mellitus with unspecified complications: Secondary | ICD-10-CM | POA: Diagnosis not present

## 2015-04-19 DIAGNOSIS — R739 Hyperglycemia, unspecified: Secondary | ICD-10-CM | POA: Diagnosis not present

## 2015-04-19 DIAGNOSIS — E1041 Type 1 diabetes mellitus with diabetic mononeuropathy: Secondary | ICD-10-CM | POA: Diagnosis not present

## 2015-04-19 DIAGNOSIS — K439 Ventral hernia without obstruction or gangrene: Secondary | ICD-10-CM | POA: Diagnosis not present

## 2015-04-19 DIAGNOSIS — R338 Other retention of urine: Secondary | ICD-10-CM | POA: Diagnosis present

## 2015-04-19 DIAGNOSIS — E1140 Type 2 diabetes mellitus with diabetic neuropathy, unspecified: Secondary | ICD-10-CM | POA: Diagnosis present

## 2015-04-19 DIAGNOSIS — K760 Fatty (change of) liver, not elsewhere classified: Secondary | ICD-10-CM | POA: Diagnosis present

## 2015-04-19 DIAGNOSIS — K219 Gastro-esophageal reflux disease without esophagitis: Secondary | ICD-10-CM | POA: Diagnosis present

## 2015-04-19 DIAGNOSIS — E1065 Type 1 diabetes mellitus with hyperglycemia: Secondary | ICD-10-CM | POA: Diagnosis not present

## 2015-04-19 DIAGNOSIS — K861 Other chronic pancreatitis: Secondary | ICD-10-CM | POA: Diagnosis present

## 2015-04-19 DIAGNOSIS — N189 Chronic kidney disease, unspecified: Secondary | ICD-10-CM | POA: Diagnosis present

## 2015-04-19 DIAGNOSIS — E1165 Type 2 diabetes mellitus with hyperglycemia: Secondary | ICD-10-CM | POA: Diagnosis present

## 2015-04-19 DIAGNOSIS — K430 Incisional hernia with obstruction, without gangrene: Secondary | ICD-10-CM | POA: Diagnosis not present

## 2015-04-19 DIAGNOSIS — N200 Calculus of kidney: Secondary | ICD-10-CM | POA: Diagnosis present

## 2015-05-02 DIAGNOSIS — Z8719 Personal history of other diseases of the digestive system: Secondary | ICD-10-CM | POA: Diagnosis not present

## 2015-05-02 DIAGNOSIS — Z48815 Encounter for surgical aftercare following surgery on the digestive system: Secondary | ICD-10-CM | POA: Diagnosis not present

## 2015-06-14 ENCOUNTER — Other Ambulatory Visit: Payer: Self-pay | Admitting: Family Medicine

## 2015-07-06 ENCOUNTER — Other Ambulatory Visit: Payer: Self-pay | Admitting: Family Medicine

## 2015-07-08 ENCOUNTER — Other Ambulatory Visit: Payer: Self-pay | Admitting: Family Medicine

## 2015-07-08 NOTE — Telephone Encounter (Signed)
Six mo ok 

## 2015-07-09 NOTE — Telephone Encounter (Signed)
Ok six mo worth 

## 2015-07-21 ENCOUNTER — Ambulatory Visit (INDEPENDENT_AMBULATORY_CARE_PROVIDER_SITE_OTHER): Payer: Medicare Other | Admitting: Family Medicine

## 2015-07-21 VITALS — BP 116/82 | Temp 98.3°F | Ht 63.0 in | Wt 147.4 lb

## 2015-07-21 DIAGNOSIS — K219 Gastro-esophageal reflux disease without esophagitis: Secondary | ICD-10-CM

## 2015-07-21 DIAGNOSIS — Z8719 Personal history of other diseases of the digestive system: Secondary | ICD-10-CM

## 2015-07-21 DIAGNOSIS — R109 Unspecified abdominal pain: Secondary | ICD-10-CM | POA: Diagnosis not present

## 2015-07-21 DIAGNOSIS — K439 Ventral hernia without obstruction or gangrene: Secondary | ICD-10-CM | POA: Diagnosis not present

## 2015-07-21 MED ORDER — PROMETHAZINE HCL 50 MG PO TABS: ORAL_TABLET | ORAL | Status: DC

## 2015-07-21 MED ORDER — OXYCODONE-ACETAMINOPHEN 5-325 MG PO TABS: ORAL_TABLET | ORAL | Status: DC

## 2015-07-21 MED ORDER — SUCRALFATE 1 G PO TABS: 1.0000 g | ORAL_TABLET | Freq: Three times a day (TID) | ORAL | Status: DC

## 2015-07-21 NOTE — Progress Notes (Signed)
   Subjective:    Patient ID: [name] [name], female    DOB: [birth year], 44 y.o.   MRN: [id number]  Abdominal Pain This is a new problem. The current episode started in the past 7 days. The problem has been unchanged. The quality of the pain is burning and tearing. Associated symptoms include nausea. Treatments tried: Omeprazole, Ibuprofen. The treatment provided no relief.   Patient states no other concerns this visit.  Hit hard on Sunday, evrything bland, next  Pain comes and goes sharp at times burning at times background ache some daytime and nighttime features  Took an extra omeprazole normally takes 2 per day has bumped up to 3.  Has not had pancreatitis since pancreas surgery in 2012. Next. Some nausea.   Some epigastric pain., History of gastritis also  Pt had hernia surg without mesh, this is concerning to patient. With epigastric discomfort she always is worried that the surgical repair is tearing open. Has had inflammatory pain with change of motion. Next  Long-standing history of reflux. Somewhat worse lately.   Review of Systems  Gastrointestinal: Positive for nausea and abdominal pain.   No headache no chest pain no fever no chills no dysuria complete ROS otherwise negative    Objective:   Physical Exam Alert mild malaise HEENT normal. Vitals stable lungs clear heart rare rhythm epigastrium tender to palpation. Healing scar noted no recurrence of hernia palpated no right upper quadrant tenderness bowel sounds good no low abdominal tenderness       Assessment & Plan:  Impression 1 epigastric pain multifactorial. Could be pancreatitis with patient's history could be flare of gastritis. Doubt reemergence of hernia discussed plan add Carafate. Pain medicine short-term. Nausea medicine. Blood work and x-rays. If relatively normal wall maintain same. Due to chronic GI pain discussion held and patient does need a local gastroenterologist. Patient request Dr. Rhona Leavensaman WSL

## 2015-07-22 ENCOUNTER — Other Ambulatory Visit (HOSPITAL_COMMUNITY)
Admission: RE | Admit: 2015-07-22 | Discharge: 2015-07-22 | Disposition: A | Discharge status: Home / Self Care | Payer: Medicare Other | Source: Ambulatory Visit | Attending: Family Medicine | Admitting: Family Medicine

## 2015-07-22 ENCOUNTER — Ambulatory Visit (HOSPITAL_COMMUNITY)
Admission: RE | Admit: 2015-07-22 | Discharge: 2015-07-22 | Disposition: A | Discharge status: Home / Self Care | Payer: Medicare Other | Source: Ambulatory Visit | Attending: Family Medicine | Admitting: Family Medicine

## 2015-07-22 DIAGNOSIS — R110 Nausea: Secondary | ICD-10-CM | POA: Insufficient documentation

## 2015-07-22 DIAGNOSIS — R1013 Epigastric pain: Secondary | ICD-10-CM | POA: Insufficient documentation

## 2015-07-22 DIAGNOSIS — K5900 Constipation, unspecified: Secondary | ICD-10-CM | POA: Insufficient documentation

## 2015-07-22 LAB — CBC WITH DIFFERENTIAL/PLATELET
BASOS ABS: 0 10*3/uL (ref 0.0–0.1)
Basophils Relative: 0 %
EOS PCT: 1 %
Eosinophils Absolute: 0 10*3/uL (ref 0.0–0.7)
HEMATOCRIT: 45.6 % (ref 36.0–46.0)
Hemoglobin: 15.7 g/dL — ABNORMAL HIGH (ref 12.0–15.0)
LYMPHS ABS: 2.7 10*3/uL (ref 0.7–4.0)
LYMPHS PCT: 54 %
MCH: 33.8 pg (ref 26.0–34.0)
MCHC: 34.4 g/dL (ref 30.0–36.0)
MCV: 98.3 fL (ref 78.0–100.0)
MONO ABS: 0.2 10*3/uL (ref 0.1–1.0)
Monocytes Relative: 4 %
NEUTROS ABS: 2 10*3/uL (ref 1.7–7.7)
Neutrophils Relative %: 41 %
Platelets: 219 10*3/uL (ref 150–400)
RBC: 4.64 MIL/uL (ref 3.87–5.11)
RDW: 13.2 % (ref 11.5–15.5)
WBC: 5 10*3/uL (ref 4.0–10.5)

## 2015-07-22 LAB — HEPATIC FUNCTION PANEL
ALT: 28 U/L (ref 14–54)
AST: 30 U/L (ref 15–41)
Albumin: 4.1 g/dL (ref 3.5–5.0)
Alkaline Phosphatase: 79 U/L (ref 38–126)
BILIRUBIN DIRECT: 0.2 mg/dL (ref 0.1–0.5)
BILIRUBIN INDIRECT: 0.4 mg/dL (ref 0.3–0.9)
BILIRUBIN TOTAL: 0.6 mg/dL (ref 0.3–1.2)
Total Protein: 7.6 g/dL (ref 6.5–8.1)

## 2015-07-22 LAB — AMYLASE: Amylase: 26 U/L — ABNORMAL LOW (ref 28–100)

## 2015-07-22 LAB — LIPASE, BLOOD: Lipase: 18 U/L — ABNORMAL LOW (ref 22–51)

## 2015-07-27 ENCOUNTER — Encounter (INDEPENDENT_AMBULATORY_CARE_PROVIDER_SITE_OTHER): Payer: Self-pay | Admitting: *Deleted

## 2015-08-09 ENCOUNTER — Ambulatory Visit (INDEPENDENT_AMBULATORY_CARE_PROVIDER_SITE_OTHER): Payer: Medicare Other | Admitting: Family Medicine

## 2015-08-09 ENCOUNTER — Encounter: Payer: Self-pay | Admitting: Family Medicine

## 2015-08-09 ENCOUNTER — Other Ambulatory Visit: Payer: Self-pay | Admitting: Family Medicine

## 2015-08-09 VITALS — BP 110/78 | Ht 63.0 in | Wt 150.4 lb

## 2015-08-09 DIAGNOSIS — M545 Low back pain: Secondary | ICD-10-CM | POA: Diagnosis not present

## 2015-08-09 MED ORDER — OXYCODONE-ACETAMINOPHEN 5-325 MG PO TABS: ORAL_TABLET | ORAL | Status: DC

## 2015-08-09 MED ORDER — CHLORZOXAZONE 500 MG PO TABS: ORAL_TABLET | ORAL | Status: DC

## 2015-08-09 MED ORDER — ETODOLAC 400 MG PO TABS: ORAL_TABLET | ORAL | Status: DC

## 2015-08-09 NOTE — Patient Instructions (Signed)

## 2015-08-09 NOTE — Progress Notes (Signed)
   Subjective:    Patient ID: [name] [name], female    DOB: 1971-09-12, 44 y.o.   MRN: [id number]  Back Pain This is a new problem. The current episode started 1 to 4 weeks ago. The quality of the pain is described as aching. Radiates to: Bilateral hips. The pain is at a severity of 6/10. The pain is moderate. The pain is worse during the night. The symptoms are aggravated by lying down, bending and position. She has tried NSAIDs (Ibuprofen) for the symptoms.   Patient states no other concerns this visit.  Low back symmetric diffuweno sig change with motion   otc ibuprofen prn took a pain pill   hleped sonme    Review of Systems  Musculoskeletal: Positive for back pain.   No urinary symptoms no fever ROS otherwise negative    Objective:   Physical Exam   Alert mild malaise. Vital stable lungs clear. Heart regular in rhythm. Lumbar region tender to deep palpation diffusely negative straight leg raise     Assessment & Plan:  Impression lumbar strain/spasm plan Lodine 400 mg twice a day with food. Chlorzoxazone 500 3 times a day when necessary for spasm. Symptomatic care discussed. Oxycodone when necessary for severe pain stretching exercises discussed in encourage WSL

## 2015-08-10 ENCOUNTER — Telehealth: Payer: Self-pay | Admitting: Family Medicine

## 2015-08-10 MED ORDER — CYCLOBENZAPRINE HCL 10 MG PO TABS: 10.0000 mg | ORAL_TABLET | Freq: Three times a day (TID) | ORAL | Status: DC | PRN

## 2015-08-10 NOTE — Telephone Encounter (Signed)
Pt is calling saying she can not get any rest from her back pain An has decided she would like to try one that may make her drowsy  [name] [name]

## 2015-08-10 NOTE — Telephone Encounter (Signed)
Called patient and informed her per Dr.Steve Luking- Flexeril was sent in for patient to [facility]. Patient verbalized understanding.

## 2015-08-10 NOTE — Telephone Encounter (Signed)
Flexeril 10 tid numb thirty (no soma in this pt!)

## 2015-08-17 ENCOUNTER — Other Ambulatory Visit: Payer: Self-pay | Admitting: Family Medicine

## 2015-08-29 ENCOUNTER — Ambulatory Visit (INDEPENDENT_AMBULATORY_CARE_PROVIDER_SITE_OTHER): Payer: Medicare Other | Admitting: Internal Medicine

## 2015-08-30 ENCOUNTER — Encounter (INDEPENDENT_AMBULATORY_CARE_PROVIDER_SITE_OTHER): Payer: Self-pay | Admitting: *Deleted

## 2015-09-13 ENCOUNTER — Other Ambulatory Visit: Payer: Self-pay | Admitting: Family Medicine

## 2015-10-10 ENCOUNTER — Other Ambulatory Visit: Payer: Self-pay | Admitting: Family Medicine

## 2015-10-11 ENCOUNTER — Ambulatory Visit (INDEPENDENT_AMBULATORY_CARE_PROVIDER_SITE_OTHER): Payer: Medicare Other | Admitting: Family Medicine

## 2015-10-11 VITALS — BP 124/80 | Temp 99.5°F | Wt 149.0 lb

## 2015-10-11 DIAGNOSIS — B9689 Other specified bacterial agents as the cause of diseases classified elsewhere: Secondary | ICD-10-CM

## 2015-10-11 DIAGNOSIS — J019 Acute sinusitis, unspecified: Secondary | ICD-10-CM

## 2015-10-11 MED ORDER — AMOXICILLIN 500 MG PO TABS: 500.0000 mg | ORAL_TABLET | Freq: Three times a day (TID) | ORAL | Status: DC

## 2015-10-11 NOTE — Progress Notes (Signed)
   Subjective:    Patient ID: [name] [name], female    DOB: 1971/01/20, 45 y.o.   MRN: [id number]  Cough This is a new problem. Episode onset: 10 days. Associated symptoms include ear pain, headaches, nasal congestion, rhinorrhea and a sore throat. Pertinent negatives include no chest pain, fever, shortness of breath or wheezing. Treatments tried: tylenol cold and flu.    Patient relates symptoms present over the past week and half with progressive congestion coughing some sinus pressure and pain   Review of Systems  Constitutional: Negative for fever and activity change.  HENT: Positive for congestion, ear pain, rhinorrhea and sore throat.   Eyes: Negative for discharge.  Respiratory: Positive for cough. Negative for shortness of breath and wheezing.   Cardiovascular: Negative for chest pain.  Neurological: Positive for headaches.       Objective:   Physical Exam  Constitutional: She appears well-developed.  HENT:  Head: Normocephalic.  Nose: Nose normal.  Mouth/Throat: Oropharynx is clear and moist. No oropharyngeal exudate.  Neck: Neck supple.  Cardiovascular: Normal rate and normal heart sounds.   No murmur heard. Pulmonary/Chest: Effort normal and breath sounds normal. She has no wheezes.  Lymphadenopathy:    She has no cervical adenopathy.  Skin: Skin is warm and dry.  Nursing note and vitals reviewed.         Assessment & Plan:  Patient was seen today for upper respiratory illness. It is felt that the patient is dealing with sinusitis. Antibiotics were prescribed today. Importance of compliance with medication was discussed. Symptoms should gradually resolve over the course of the next several days. If high fevers, progressive illness, difficulty breathing, worsening condition or failure for symptoms to improve over the next several days then the patient is to follow-up. If any emergent conditions the patient is to follow-up in the emergency department otherwise to  follow-up in the office.

## 2015-10-17 ENCOUNTER — Other Ambulatory Visit: Payer: Self-pay | Admitting: Family Medicine

## 2015-10-19 ENCOUNTER — Encounter (INDEPENDENT_AMBULATORY_CARE_PROVIDER_SITE_OTHER): Payer: Self-pay | Admitting: Internal Medicine

## 2015-10-20 ENCOUNTER — Encounter (INDEPENDENT_AMBULATORY_CARE_PROVIDER_SITE_OTHER): Payer: Self-pay | Admitting: Internal Medicine

## 2015-11-01 ENCOUNTER — Encounter (INDEPENDENT_AMBULATORY_CARE_PROVIDER_SITE_OTHER): Payer: Self-pay | Admitting: *Deleted

## 2015-11-01 ENCOUNTER — Encounter (INDEPENDENT_AMBULATORY_CARE_PROVIDER_SITE_OTHER): Payer: Self-pay | Admitting: Internal Medicine

## 2015-11-01 ENCOUNTER — Ambulatory Visit (INDEPENDENT_AMBULATORY_CARE_PROVIDER_SITE_OTHER): Payer: Medicare Other | Admitting: Internal Medicine

## 2015-11-01 VITALS — BP 118/80 | HR 94 | Temp 97.3°F | Ht 63.0 in | Wt 150.3 lb

## 2015-11-01 DIAGNOSIS — K859 Acute pancreatitis without necrosis or infection, unspecified: Secondary | ICD-10-CM | POA: Diagnosis not present

## 2015-11-01 DIAGNOSIS — R1012 Left upper quadrant pain: Secondary | ICD-10-CM

## 2015-11-01 DIAGNOSIS — K858 Other acute pancreatitis without necrosis or infection: Secondary | ICD-10-CM

## 2015-11-01 NOTE — Patient Instructions (Signed)
Amylase, Lipase, US abdomen OV in 6 months.

## 2015-11-01 NOTE — Progress Notes (Addendum)
Subjective:    Patient ID: [name] [name], female    DOB: [birth year], 45 y.o.   MRN: [id number]  HPI Referred by Dr. Gerda Diss for abdominal pain. Hx o chronic nausea. She tells me at times she has left upper quadrant pain radiating into her back. This pain is apparently chronic. Rates the pain at a 4/10 which she says is not bad. Pain usually occurs at night when she is not doing anything. She says her acid reflux is controlled with Omeprazole BID.  Her appetite is good. No weight loss. She says she is actually gaining weight.  She usually has a BM every other day. When she has a BM she will have 2-3 BM  A day.    Hx of acute/chronic pancreatitis status post Whipple procedure 08/21/2010 by Dr. Cliffton Asters.  Hx of entercutaneous fistula takedown with Dr. Cliffton Asters 03/17/2012 from a feeding tube. Symptoms of pancreatitis after GB surgery 2001 ventral hernia repair in July of 2016 at [facility] due to the pain.  07/22/2015 Amylase 26, Lipase 18 CBC    Component Value Date/Time   WBC 5.0 07/22/2015 1038   RBC 4.64 07/22/2015 1038   HGB 15.7* 07/22/2015 1038   HCT 45.6 07/22/2015 1038   PLT 219 07/22/2015 1038   MCV 98.3 07/22/2015 1038   MCH 33.8 07/22/2015 1038   MCHC 34.4 07/22/2015 1038   RDW 13.2 07/22/2015 1038   LYMPHSABS 2.7 07/22/2015 1038   MONOABS 0.2 07/22/2015 1038   EOSABS 0.0 07/22/2015 1038   BASOSABS 0.0 07/22/2015 1038    CMP Latest Ref Rng 07/22/2015 12/20/2014 11/18/2014  Glucose 70 - 99 mg/dL - 213(Y) 865(H)  BUN 6 - 23 mg/dL - - 11  Creatinine 8.46 - 1.10 mg/dL - - 9.62(X)  Sodium 528 - 145 mmol/L - 139 134(L)  Potassium 3.5 - 5.1 mmol/L - 4.5 4.1  Chloride 96 - 112 mmol/L - - 108  CO2 19 - 32 mmol/L - - 19  Calcium 8.4 - 10.5 mg/dL - - 9.1  Total Protein 6.5 - 8.1 g/dL 7.6 - 7.1  Total Bilirubin 0.3 - 1.2 mg/dL 0.6 - 0.4  Alkaline Phos 38 - 126 U/L 79 - 61  AST 15 - 41 U/L 30 - 22  ALT 14 - 54 U/L 28 - 24          12/31/2008: Diagnostic  EGD.  INDICATIONS FOR PROCEDURE: A patient with intermittent nausea, vomiting, epigastric pain, low volume hematemesis. Also complains of melena and intermittent hematochezia. No evidence of pancreatitis this admission. Hemoglobin has remained entirely normal over serial assays. Hemoglobin this morning is 14.4. She has been using naproxen and came off her Protonix 1 month ago. EGD is now being done to further evaluate symptoms. Risks, benefits, alternatives and limitations have been reviewed. Please see the documentation in the medical record. IMPRESSION: Undulating Z-line otherwise normal esophagus and intact Nissen fundoplication and diffuse submucosal petechiae likely NSAID effect, otherwise unremarkable gastric mucosa status post biopsy. Patent pylorus and normal D1, D2. Biopsy:   STOMACH, BIOPSY: HELICOBACTER PYLORI GASTRITIS, NO EVIDENCE OF INTESTINAL METAPLASIA, DYSPLASIA OR MALIGNANCY IDENTIFIED.    03/14/2009 Dr. [name] Diagnostic ileocolonoscopy.  INDICATIONS FOR PROCEDURE: A 45 year old lady with chronic constipation and intermittent hematochezia. History chronic pancreatitis on pancreatic enzymes supplementation. IMPRESSION: Anal papilla, minimal external hemorrhoidal tags, otherwise normal rectum, normal colon, normal terminal ileum. I suspect trivial anorectal bleeding of benign etiology.   Review of Systems Past Medical History  Diagnosis Date  . Anxiety   .  Migraine headache   . Chronic pain     has pain specialist  . Major depression, recurrent, chronic (HCC)     w/  Hx  multiple suicide attempts (overdose)  . Acute bacterial bronchitis     dx 12-06-2014  --  FINISHED Z-PAC-  NO FEVER BUT STILL INTERMITTANT  PRODUCTIVE COUGH  . History of subarachnoid hemorrhage     01-08-2013--  fell on concrete--  resolved without surgical intervention  . Type 2 diabetes mellitus, uncontrolled (HCC)   . GERD  (gastroesophageal reflux disease)   . History of chronic pancreatitis     multple recurrent--  s/p  whipple 2011  . History of chronic gastritis   . History of esophageal ulcer   . IBS (irritable bowel syndrome)   . Left ureteral calculus   . History of kidney stones   . History of suicide attempt     hx multiple attempts by overdosing of meds    Past Surgical History  Procedure Laterality Date  . Whipple procedure  2011   at [facility]  . Cesarean section  x2  last one 02-29-2000    last one w/ BILATERAL TUBAL LIGATION  . Colonoscopy  03-14-2009    dx ileocolonscopy  . Laparoscopic assisted vaginal hysterectomy  12-16-2006  . Esophagogastroduodenoscopy  last  one 12-31-2008  . Laparoscopic nissen fundoplication  Nov 2001    w/  CHOLECYSTECTOMY  . Repair perforated viscus  post whipple  x5    last one 06-23-2012  . Cystoscopy with retrograde pyelogram, ureteroscopy and stent placement Left 12/20/2014    Procedure: CYSTOSCOPY, LEFT URETEROSCOPY, STONE REMOVAL, LASER LITHOTRIPSY;  Surgeon: Barron Alvine, MD;  Location: [facility];  Service: Urology;  Laterality: Left;  . Holmium laser application Left 12/20/2014    Procedure: HOLMIUM LASER APPLICATION;  Surgeon: Barron Alvine, MD;  Location: [facility];  Service: Urology;  Laterality: Left;    Allergies  Allergen Reactions  . Buspar [Buspirone] Other (See Comments)    Dizziness  . Metoclopramide Hcl Other (See Comments)    REGLAN REACTION: "lock-jaw"    Current Outpatient Prescriptions on File Prior to Visit  Medication Sig Dispense Refill  . Insulin Glargine (LANTUS SOLOSTAR) 100 UNIT/ML Solostar Pen Inject 25 Units into the skin at bedtime.     . insulin lispro (HUMALOG) 100 UNIT/ML injection Inject 0-9 Units into the skin 3 (three) times daily. 0-9 Units, Subcutaneous, 3 times daily with meals, CBG < 70: Drink or eat something sweet and recheck blood sugar. CBG 70 - 120: 0 units CBG 121 - 150: 1  unit CBG 151 - 200: 2 units CBG 201 - 250: 3 units CBG 251 - 300: 5 units CBG 301 - 350: 7 units CBG 351 - 400: 9 units CBG > 400: call MD    . nortriptyline (PAMELOR) 25 MG capsule TAKE 3 CAPSULES BY MOUTH AT BEDTIME. 90 capsule 0  . omeprazole (PRILOSEC) 20 MG capsule TAKE 1 CAPSULE BY MOUTH TWICE DAILY. 30 capsule 0  . ondansetron (ZOFRAN ODT) 4 MG disintegrating tablet Take 1 tablet (4 mg total) by mouth every 8 (eight) hours as needed for nausea. 20 tablet 0  . promethazine (PHENERGAN) 50 MG tablet TAKE (1) TABLET BY MOUTH EVERY (6) HOURS AS NEEDED FOR NAUSEA. 30 tablet 0  . topiramate (TOPAMAX) 25 MG tablet TAKE 1 TABLET EVERY MORNING AND 2 TABLETS AT BEDTIME. 90 tablet 0  . zolpidem (AMBIEN) 10 MG tablet TAKE ONE TABLET BY  MOUTH AT BEDTIME AS NEEDED FOR SLEEP. 30 tablet 5   No current facility-administered medications on file prior to visit.        Objective:   Physical Exam Blood pressure 118/80, pulse 94, temperature 97.3 F (36.3 C), height  (1.6 m), weight 150 lb 4.8 oz (68.176 kg). Alert and oriented. Skin warm and dry. Oral mucosa is moist.   . Sclera anicteric, conjunctivae is pink. Thyroid not enlarged. No cervical lymphadenopathy. Lungs clear. Heart regular rate and rhythm.  Abdomen is soft. Bowel sounds are positive. No hepatomegaly. No abdominal masses felt. No tenderness.  No edema to lower extremities.          Assessment & Plan:  Left upper quadrant pain. Hx of chronic pancreatitis with Whipple procedure. Will get an US abdomen. Lipase, amylase, Hepatic function. OV in 6 months.

## 2015-11-02 ENCOUNTER — Ambulatory Visit (HOSPITAL_COMMUNITY): Admission: RE | Admit: 2015-11-02 | Payer: Medicare Other | Source: Ambulatory Visit

## 2015-11-03 LAB — HEPATIC FUNCTION PANEL
ALT: 38 U/L — ABNORMAL HIGH (ref 6–29)
AST: 38 U/L — ABNORMAL HIGH (ref 10–30)
Albumin: 4.1 g/dL (ref 3.6–5.1)
Alkaline Phosphatase: 77 U/L (ref 33–115)
BILIRUBIN DIRECT: 0.1 mg/dL (ref <=0.2)
BILIRUBIN INDIRECT: 0.4 mg/dL (ref 0.2–1.2)
TOTAL PROTEIN: 6.7 g/dL (ref 6.1–8.1)
Total Bilirubin: 0.5 mg/dL (ref 0.2–1.2)

## 2015-11-04 ENCOUNTER — Ambulatory Visit: Payer: Medicare Other | Admitting: Family Medicine

## 2015-11-04 LAB — AMYLASE: Amylase: 18 U/L (ref 0–105)

## 2015-11-04 LAB — LIPASE: Lipase: <5 U/L — ABNORMAL LOW (ref 7–60)

## 2015-11-06 ENCOUNTER — Other Ambulatory Visit: Payer: Self-pay | Admitting: Family Medicine

## 2015-11-07 ENCOUNTER — Telehealth (INDEPENDENT_AMBULATORY_CARE_PROVIDER_SITE_OTHER): Payer: Self-pay | Admitting: Internal Medicine

## 2015-11-07 NOTE — Telephone Encounter (Signed)
[name] [name] left a message on the office voice mail saying she was supposed to have blood drawn on last Tuesday but the lab wasn't able to draw anything until Thursday. She'd like a phone call with her results as she was still in pain per the message.  Pt's ph# [phone]  Thank you.

## 2015-11-07 NOTE — Telephone Encounter (Signed)
[name] [name], one of the Surgical PA's from [facility] called in regards to [name] [name]. She was part of her surgical team years ago and hasn't seen her recently. [name] [name] called [name] [name] recently complaining of left upper quadrant pain. [name] [name] saw that labs were performed on [name] [name] and ordered from this office. She was going to refer [name] [name] to [facility] but wanted to make sure that would be ok with our office. She'd like a phone call regarding this.  [name] [name]'s ph# [phone] [name] [name]'s pager # [phone] Thank you.

## 2015-11-07 NOTE — Telephone Encounter (Signed)
She has appt with [facility]. I have talked with Maralyn Sago

## 2015-11-07 NOTE — Telephone Encounter (Signed)
Results given to patient. Amylase are normal. She apparently has called [facility] requesting an appt.

## 2015-11-09 DIAGNOSIS — R1011 Right upper quadrant pain: Secondary | ICD-10-CM | POA: Diagnosis not present

## 2015-11-09 DIAGNOSIS — R1012 Left upper quadrant pain: Secondary | ICD-10-CM | POA: Diagnosis not present

## 2015-11-16 ENCOUNTER — Other Ambulatory Visit: Payer: Self-pay | Admitting: "Endocrinology

## 2015-11-28 DIAGNOSIS — Z9889 Other specified postprocedural states: Secondary | ICD-10-CM | POA: Diagnosis not present

## 2015-11-28 DIAGNOSIS — R1012 Left upper quadrant pain: Secondary | ICD-10-CM | POA: Diagnosis not present

## 2015-11-28 DIAGNOSIS — Z8719 Personal history of other diseases of the digestive system: Secondary | ICD-10-CM | POA: Diagnosis not present

## 2015-11-28 DIAGNOSIS — K861 Other chronic pancreatitis: Secondary | ICD-10-CM | POA: Diagnosis not present

## 2015-11-30 ENCOUNTER — Other Ambulatory Visit: Payer: Self-pay | Admitting: Family Medicine

## 2015-12-09 ENCOUNTER — Other Ambulatory Visit: Payer: Self-pay | Admitting: Family Medicine

## 2015-12-20 ENCOUNTER — Other Ambulatory Visit: Payer: Self-pay | Admitting: Family Medicine

## 2015-12-20 NOTE — Telephone Encounter (Signed)
Ok six mo 

## 2015-12-22 DIAGNOSIS — K861 Other chronic pancreatitis: Secondary | ICD-10-CM | POA: Diagnosis not present

## 2015-12-26 ENCOUNTER — Other Ambulatory Visit: Payer: Self-pay | Admitting: Family Medicine

## 2015-12-26 ENCOUNTER — Other Ambulatory Visit: Payer: Self-pay | Admitting: "Endocrinology

## 2015-12-27 ENCOUNTER — Other Ambulatory Visit: Payer: Self-pay | Admitting: "Endocrinology

## 2015-12-30 ENCOUNTER — Telehealth: Payer: Self-pay | Admitting: Family Medicine

## 2015-12-30 DIAGNOSIS — R739 Hyperglycemia, unspecified: Secondary | ICD-10-CM

## 2015-12-30 NOTE — Telephone Encounter (Signed)
Patient left message on my voicemail, requesting referral to Dr. Talmage NapBalan for her elevated blood sugars related to a creon, a medicine [facility], needs insulin adjusted  Discussed with Eber Jonesarolyn, she suggested to find out who is currently managing patient's insulin and to recommend pt go there until we can get her scheduled with Dr. Talmage NapBalan or patient may need to be seen here before can refer  Called pt, explained, pt states she currently sees Dr. Fransico HimNida. Explained that we would be happy to work on new endocrin referral but with not being sure how long it would take for her to be seen, also explained that Dr. Talmage NapBalan could possibly request pt have current endocrin records sent for review before will schedule,  pt verbalized understanding  If ok to refer to Dr. Talmage NapBalan, please initiate in system so that I may process

## 2015-12-31 NOTE — Telephone Encounter (Signed)
Yes may process referral

## 2016-01-02 ENCOUNTER — Ambulatory Visit (INDEPENDENT_AMBULATORY_CARE_PROVIDER_SITE_OTHER): Payer: Medicare Other | Admitting: Family Medicine

## 2016-01-02 ENCOUNTER — Encounter: Payer: Self-pay | Admitting: Family Medicine

## 2016-01-02 VITALS — BP 106/74 | Temp 98.6°F | Ht 63.0 in | Wt 148.2 lb

## 2016-01-02 DIAGNOSIS — R309 Painful micturition, unspecified: Secondary | ICD-10-CM

## 2016-01-02 DIAGNOSIS — N200 Calculus of kidney: Secondary | ICD-10-CM

## 2016-01-02 LAB — POCT URINALYSIS DIPSTICK
Leukocytes, UA: NEGATIVE
PH UA: 6
Spec Grav, UA: 1.01
Urobilinogen, UA: 2

## 2016-01-02 MED ORDER — OXYCODONE-ACETAMINOPHEN 5-325 MG PO TABS: ORAL_TABLET | ORAL | Status: DC

## 2016-01-02 NOTE — Progress Notes (Signed)
   Subjective:    Patient ID: [name] [name], female    DOB: 04-May-1971, 45 y.o.   MRN: [id number]  Abdominal Pain This is a new problem. The current episode started today. The pain is located in the RLQ. Associated symptoms comments: Painful urination. The pain is relieved by nothing.     Results for orders placed or performed in visit on 01/02/16  POCT urinalysis dipstick  Result Value Ref Range   Color, UA Yellow    Clarity, UA Clear    Glucose, UA     Bilirubin, UA     Ketones, UA     Spec Grav, UA 1.010    Blood, UA Small    pH, UA 6.0    Protein, UA     Urobilinogen, UA 2.0    Nitrite, UA     Leukocytes, UA Negative Negative    Patient in today for a possible kidney stone.  Pt took no meds for abd discomfort this morn, pt a bit scared to take ibu   No nausea tod pos nausea yest   States no other concerns this visit. Positive history of prior kidney stones radiation of pain his right flank hitting down into right lower quadrant and groin. With positive dysuria and increased frequency  Review of Systems  Gastrointestinal: Positive for abdominal pain.   No vomiting no fever no chills    Objective:   Physical Exam  Alert vital stable some distress lungs clear heart rhythm abdomen hyperactive bowel sounds scar noted mild right lateral abdominal/flank tenderness  Urinalysis 2-3 red blood cells per high-power field no epis no white blood cells      Assessment & Plan:  Impression potential kidney stone plan encouraged to definitely take ibuprofen. Add oxycodone when necessary. Add Phenergan when necessary encourage liquids follow-up with urologist if persists/numerous questions answered regarding long-term management. We are working on endocrinology referral see prior note. Patient asked about a PICC line. I told her I thought this primarily good idea. Becoming more morbid difficult stick. Many questions answered 25 minutes spent most in discussion

## 2016-01-02 NOTE — Telephone Encounter (Signed)
Referral in system

## 2016-01-03 ENCOUNTER — Other Ambulatory Visit: Payer: Self-pay | Admitting: Family Medicine

## 2016-01-04 ENCOUNTER — Encounter: Payer: Self-pay | Admitting: Family Medicine

## 2016-01-06 ENCOUNTER — Ambulatory Visit (INDEPENDENT_AMBULATORY_CARE_PROVIDER_SITE_OTHER): Payer: Medicare Other | Admitting: Family Medicine

## 2016-01-06 ENCOUNTER — Encounter: Payer: Self-pay | Admitting: Family Medicine

## 2016-01-06 VITALS — Temp 99.1°F | Ht 63.0 in | Wt 149.2 lb

## 2016-01-06 DIAGNOSIS — J111 Influenza due to unidentified influenza virus with other respiratory manifestations: Secondary | ICD-10-CM

## 2016-01-06 MED ORDER — OSELTAMIVIR PHOSPHATE 75 MG PO CAPS: 75.0000 mg | ORAL_CAPSULE | Freq: Two times a day (BID) | ORAL | Status: DC

## 2016-01-06 NOTE — Progress Notes (Signed)
   Subjective:    Patient ID: [name] [name], female    DOB: 07-18-1971, 45 y.o.   MRN: [id number]  Cough This is a new problem. The current episode started in the past 7 days. Associated symptoms include headaches, nasal congestion and a sore throat. Treatments tried: dayquil.   yest morn got ht  Felt sig cong in the chest    Some burning headache diffuse  Cough drops prn    diminished energy, diminished appetite, rather sudden onset yesterday morning. Daughter has flu   Review of Systems  HENT: Positive for sore throat.   Respiratory: Positive for cough.   Neurological: Positive for headaches.       Objective:   Physical Exam   alert moderate malaise intermittent cough during exam. Vitals stable HET moderate his congestion pharynx normal lungs clear heart regular in rhythm      Assessment & Plan:   impression influenza plan Tamiflu twice a day 5 days. Symptom care discussed warning signs discussed WSL

## 2016-01-12 ENCOUNTER — Telehealth: Payer: Self-pay | Admitting: Family Medicine

## 2016-01-12 MED ORDER — CLARITHROMYCIN 500 MG PO TABS: ORAL_TABLET | ORAL | Status: DC

## 2016-01-12 NOTE — Telephone Encounter (Signed)
Called and informed patient per Dr.Steve Luking- Biaxin 1 tablet twice a day for 10 days was sent over to [facility].

## 2016-01-12 NOTE — Telephone Encounter (Signed)
Spoke with patient to discuss symptoms. Patient stated that she is still experiencing non productive cough, chest congestion, ear pain, nasal congestion. Denies fever. Please advise?

## 2016-01-12 NOTE — Telephone Encounter (Signed)
biaxin 500 bid ten d 

## 2016-01-12 NOTE — Telephone Encounter (Signed)
Pt seen 3/31 for flu, issued tamiflu  Pt is still coughing, congestion worse Wants to know if something can be called in?   [facility]

## 2016-01-17 ENCOUNTER — Other Ambulatory Visit: Payer: Self-pay | Admitting: Family Medicine

## 2016-01-26 ENCOUNTER — Other Ambulatory Visit: Payer: Self-pay | Admitting: Family Medicine

## 2016-01-30 ENCOUNTER — Telehealth: Payer: Self-pay | Admitting: Family Medicine

## 2016-01-30 NOTE — Telephone Encounter (Signed)
Pt is requesting something to be called in for a migraine and phenergan.      [facility]

## 2016-01-31 MED ORDER — PROMETHAZINE HCL 25 MG PO TABS: 25.0000 mg | ORAL_TABLET | Freq: Four times a day (QID) | ORAL | Status: DC | PRN

## 2016-01-31 MED ORDER — NAPROXEN 500 MG PO TABS: 500.0000 mg | ORAL_TABLET | Freq: Two times a day (BID) | ORAL | Status: DC

## 2016-01-31 NOTE — Telephone Encounter (Signed)
Phenergan 25 numb 24 one q six hrs prn nausea, naprosyn 500 one bid with food prn headach numb 30 one ref both

## 2016-01-31 NOTE — Telephone Encounter (Signed)
Left message on voicemail notifying patient that med was sent to pharmacy.  

## 2016-02-14 DIAGNOSIS — E119 Type 2 diabetes mellitus without complications: Secondary | ICD-10-CM | POA: Diagnosis not present

## 2016-02-14 DIAGNOSIS — K8681 Exocrine pancreatic insufficiency: Secondary | ICD-10-CM | POA: Diagnosis not present

## 2016-02-15 ENCOUNTER — Other Ambulatory Visit: Payer: Self-pay | Admitting: Family Medicine

## 2016-02-21 DIAGNOSIS — E119 Type 2 diabetes mellitus without complications: Secondary | ICD-10-CM | POA: Diagnosis not present

## 2016-02-27 ENCOUNTER — Other Ambulatory Visit: Payer: Self-pay | Admitting: Family Medicine

## 2016-03-22 ENCOUNTER — Other Ambulatory Visit: Payer: Self-pay | Admitting: Family Medicine

## 2016-03-22 NOTE — Telephone Encounter (Signed)
Ok six mo worth of all

## 2016-04-03 DIAGNOSIS — E119 Type 2 diabetes mellitus without complications: Secondary | ICD-10-CM | POA: Diagnosis not present

## 2016-04-03 DIAGNOSIS — K8681 Exocrine pancreatic insufficiency: Secondary | ICD-10-CM | POA: Diagnosis not present

## 2016-04-03 DIAGNOSIS — E1140 Type 2 diabetes mellitus with diabetic neuropathy, unspecified: Secondary | ICD-10-CM | POA: Diagnosis not present

## 2016-04-30 ENCOUNTER — Ambulatory Visit (INDEPENDENT_AMBULATORY_CARE_PROVIDER_SITE_OTHER): Payer: Medicare Other | Admitting: Internal Medicine

## 2016-04-30 ENCOUNTER — Encounter (INDEPENDENT_AMBULATORY_CARE_PROVIDER_SITE_OTHER): Payer: Self-pay | Admitting: Internal Medicine

## 2016-05-18 ENCOUNTER — Encounter: Payer: Self-pay | Admitting: Nurse Practitioner

## 2016-05-18 ENCOUNTER — Ambulatory Visit (INDEPENDENT_AMBULATORY_CARE_PROVIDER_SITE_OTHER): Payer: Medicare Other | Admitting: Nurse Practitioner

## 2016-05-18 VITALS — BP 108/76 | Temp 98.5°F | Ht 63.0 in | Wt 153.1 lb

## 2016-05-18 DIAGNOSIS — M778 Other enthesopathies, not elsewhere classified: Secondary | ICD-10-CM | POA: Diagnosis not present

## 2016-05-18 DIAGNOSIS — S39012A Strain of muscle, fascia and tendon of lower back, initial encounter: Secondary | ICD-10-CM | POA: Diagnosis not present

## 2016-05-18 DIAGNOSIS — M2550 Pain in unspecified joint: Secondary | ICD-10-CM | POA: Diagnosis not present

## 2016-05-18 DIAGNOSIS — M19042 Primary osteoarthritis, left hand: Secondary | ICD-10-CM

## 2016-05-18 DIAGNOSIS — M19041 Primary osteoarthritis, right hand: Secondary | ICD-10-CM | POA: Diagnosis not present

## 2016-05-18 MED ORDER — CYCLOBENZAPRINE HCL 10 MG PO TABS: 10.0000 mg | ORAL_TABLET | Freq: Every day | ORAL | 0 refills | Status: DC

## 2016-05-18 NOTE — Progress Notes (Signed)
Subjective:  Presents for c/o right wrist pain since May. No history of injury. Worse with certain movement. No numbness or weakness. Generalized sacroiliac pain. Does not radiate. Migratory joint pain. No fever. No tick bite. Decreased energy.   Objective:   BP 108/76   Temp 98.5 F (36.9 C) (Oral)   Ht 5' 3"  (1.6 m)   Wt 153 lb 2 oz (69.5 kg)   BMI 27.12 kg/m  NAD. Alert, oriented. Lungs clear. Heart RRR. Tenderness along dorsal aspect right wrist. No edema. Normal ROM of the wrist with mild tenderness. Phalen and Tinel neg. Strong radial pulse. Hand strength 5+. Sensation grossly intact. Mild diffuse tenderness along sacroiliac area. Moderate osteoarthritis changes in fingers of both hands.   Assessment:  Arthralgia - Plan: Rheumatoid factor, Antinuclear Antib (ANA), Sed Rate (ESR)  Lumbar strain, initial encounter  Right wrist tendinitis  Osteoarthritis of both hands, unspecified osteoarthritis type  Plan:  Meds ordered this encounter  Medications  . cyclobenzaprine (FLEXERIL) 10 MG tablet    Sig: Take 1 tablet (10 mg total) by mouth at bedtime.    Dispense:  30 tablet    Refill:  0    Order Specific Question:   Supervising Provider    Answer:   Mikey Kirschner [[id number]]   Given Rx for wrist brace. NSAIDs as directed. Back exercises as directed. Recheck if persists.

## 2016-05-19 LAB — RHEUMATOID FACTOR: Rhuematoid fact SerPl-aCnc: <10 IU/mL (ref 0.0–13.9)

## 2016-05-19 LAB — SEDIMENTATION RATE: SED RATE: 14 mm/h (ref 0–32)

## 2016-05-19 LAB — ANA: Anti Nuclear Antibody(ANA): NEGATIVE

## 2016-06-12 ENCOUNTER — Other Ambulatory Visit: Payer: Self-pay | Admitting: Family Medicine

## 2016-06-12 NOTE — Telephone Encounter (Signed)
Six mo worth ok 

## 2016-06-18 ENCOUNTER — Other Ambulatory Visit: Payer: Self-pay | Admitting: Family Medicine

## 2016-07-09 ENCOUNTER — Other Ambulatory Visit: Payer: Self-pay | Admitting: *Deleted

## 2016-07-09 ENCOUNTER — Telehealth: Payer: Self-pay | Admitting: Family Medicine

## 2016-07-09 MED ORDER — ONDANSETRON 4 MG PO TBDP: 4.0000 mg | ORAL_TABLET | Freq: Three times a day (TID) | ORAL | 1 refills | Status: DC | PRN

## 2016-07-09 NOTE — Telephone Encounter (Signed)
Ok plus 2 ref 

## 2016-07-09 NOTE — Telephone Encounter (Signed)
Med sent to pharm. Pt notified.  

## 2016-07-09 NOTE — Telephone Encounter (Signed)
Pt is requesting zofran to be called in for nausea.    [facility]

## 2016-07-30 ENCOUNTER — Encounter: Payer: Self-pay | Admitting: Family Medicine

## 2016-07-30 ENCOUNTER — Ambulatory Visit (INDEPENDENT_AMBULATORY_CARE_PROVIDER_SITE_OTHER): Payer: Medicare Other | Admitting: Family Medicine

## 2016-07-30 VITALS — BP 112/76 | Temp 98.7°F | Ht 63.0 in | Wt 153.0 lb

## 2016-07-30 DIAGNOSIS — R1013 Epigastric pain: Secondary | ICD-10-CM | POA: Diagnosis not present

## 2016-07-30 MED ORDER — OXYCODONE-ACETAMINOPHEN 5-325 MG PO TABS: 1.0000 | ORAL_TABLET | Freq: Four times a day (QID) | ORAL | 0 refills | Status: DC | PRN

## 2016-07-30 MED ORDER — SUCRALFATE 1 G PO TABS: ORAL_TABLET | ORAL | 0 refills | Status: DC

## 2016-07-30 MED ORDER — PROMETHAZINE HCL 50 MG PO TABS: 50.0000 mg | ORAL_TABLET | Freq: Four times a day (QID) | ORAL | 0 refills | Status: DC | PRN

## 2016-07-30 NOTE — Progress Notes (Signed)
   Subjective:    Patient ID: [name] [name], female    DOB: [birth year], 45 y.o.   MRN: [id number]  Abdominal Pain  This is a new problem. Episode onset: one week ago. Associated symptoms include diarrhea, headaches, nausea and vomiting. Treatments tried: phenergan and naproxen.  several spells o fvomiting  Epigastric region, This is where the pain is worse. Minimal radiation. Patient has had history of cholecystectomy.  Not around anyone else sick with viral gastritis.  Progressive pain over the past week.  Patient claims her sugars overall are decent with her underlying type 1 diabetes equivalent  Years since last flare of trouble with pancreatitis  Any fever.? No  We called in zofran pt had phenergan   Using naproxen prn       Review of Systems  Gastrointestinal: Positive for abdominal pain, diarrhea, nausea and vomiting.  Neurological: Positive for headaches.       Objective:   Physical Exam  Alert mild malaise HEENT normal hydration good vitals stable lungs clear. Heart regular in rhythm. Distinct epigastric tenderness no rebound no guarding excellent bowel sounds hyperactive      Assessment & Plan:  Impression viral gastroenteritis versus flare of chronic pancreatitis versus flare of gastritis patient is not toxic and would like empirical therapy before lots of tests. She is a very difficult IV stick this is reasonable plan trial of Carafate. Plus Phenergan for nausea. Oxycodone for potential substantial pain. Symptom care discussed 25 minutes most spent in discussion regarding this complicated situation WSL

## 2016-08-09 ENCOUNTER — Other Ambulatory Visit: Payer: Self-pay | Admitting: Family Medicine

## 2016-08-14 ENCOUNTER — Encounter: Payer: Self-pay | Admitting: Family Medicine

## 2016-08-14 ENCOUNTER — Ambulatory Visit (INDEPENDENT_AMBULATORY_CARE_PROVIDER_SITE_OTHER): Payer: Medicare Other | Admitting: Family Medicine

## 2016-08-14 VITALS — BP 118/84 | Temp 98.4°F | Ht 63.0 in | Wt 153.5 lb

## 2016-08-14 DIAGNOSIS — R1013 Epigastric pain: Secondary | ICD-10-CM

## 2016-08-14 DIAGNOSIS — K861 Other chronic pancreatitis: Secondary | ICD-10-CM

## 2016-08-14 DIAGNOSIS — Z8719 Personal history of other diseases of the digestive system: Secondary | ICD-10-CM | POA: Diagnosis not present

## 2016-08-14 MED ORDER — OXYCODONE-ACETAMINOPHEN 5-325 MG PO TABS
ORAL_TABLET | ORAL | 0 refills | Status: DC
Start: 2016-08-14 — End: 2016-10-31

## 2016-08-14 MED ORDER — SUCRALFATE 1 G PO TABS: ORAL_TABLET | ORAL | 0 refills | Status: DC

## 2016-08-14 NOTE — Progress Notes (Signed)
   Subjective:    Patient ID: [name] [name], female    DOB: 05/05/1971, 45 y.o.   MRN: [id number]  Abdominal Pain  This is a new problem. The current episode started 1 to 4 weeks ago. The problem has been gradually worsening. The pain is located in the epigastric region. Associated symptoms include nausea. Treatments tried: Oxycodone, Phenergan    Patient states no other concerns this visit.   [facility], last time they saw disc possible future surger, but they did sche d f u  Has vomited a coupek times  Has used most of pain med  Still on carafate   Patient does have insight that this appears to worsened since her parents very recently split up. Tremendous amount of stress upon the patient.  Review of Systems  Gastrointestinal: Positive for abdominal pain and nausea.       Objective:   Physical Exam  Alert vital stable no acute distress. Lungs clear. Heart regular rhythm. Abdomen moderate epigastric tenderness.      Assessment & Plan:  #1 chronic reflux/gastritis #2 history of chronic pancreatitis #3 history of chronic pain #4 chronic depression anxiety plan encouraged patient to maintain a local GI specialist. Rationale discussed. Carafate refilled. Oxycodone to use only sparingly which the patient states she will.

## 2016-08-19 DIAGNOSIS — K861 Other chronic pancreatitis: Secondary | ICD-10-CM | POA: Insufficient documentation

## 2016-08-20 ENCOUNTER — Encounter: Payer: Self-pay | Admitting: Family Medicine

## 2016-08-23 ENCOUNTER — Encounter (INDEPENDENT_AMBULATORY_CARE_PROVIDER_SITE_OTHER): Payer: Self-pay | Admitting: *Deleted

## 2016-08-23 ENCOUNTER — Ambulatory Visit (INDEPENDENT_AMBULATORY_CARE_PROVIDER_SITE_OTHER): Payer: Medicare Other | Admitting: Internal Medicine

## 2016-08-23 ENCOUNTER — Encounter (INDEPENDENT_AMBULATORY_CARE_PROVIDER_SITE_OTHER): Payer: Self-pay | Admitting: Internal Medicine

## 2016-08-23 VITALS — BP 120/80 | HR 80 | Temp 98.0°F | Ht 63.0 in | Wt 150.5 lb

## 2016-08-23 DIAGNOSIS — K861 Other chronic pancreatitis: Secondary | ICD-10-CM

## 2016-08-23 DIAGNOSIS — R748 Abnormal levels of other serum enzymes: Secondary | ICD-10-CM

## 2016-08-23 DIAGNOSIS — R1313 Dysphagia, pharyngeal phase: Secondary | ICD-10-CM

## 2016-08-23 LAB — HEPATITIS C ANTIBODY: HCV AB: NEGATIVE

## 2016-08-23 LAB — HEPATIC FUNCTION PANEL
ALBUMIN: 3.7 g/dL (ref 3.6–5.1)
ALK PHOS: 113 U/L (ref 33–115)
ALT: 46 U/L — ABNORMAL HIGH (ref 6–29)
AST: 39 U/L — ABNORMAL HIGH (ref 10–35)
BILIRUBIN TOTAL: 0.5 mg/dL (ref 0.2–1.2)
Bilirubin, Direct: 0.1 mg/dL (ref <=0.2)
Indirect Bilirubin: 0.4 mg/dL (ref 0.2–1.2)
Total Protein: 6.5 g/dL (ref 6.1–8.1)

## 2016-08-23 LAB — AMYLASE: Amylase: 23 U/L (ref 0–105)

## 2016-08-23 LAB — HEPATITIS B SURFACE ANTIGEN: HEP B S AG: NEGATIVE

## 2016-08-23 MED ORDER — PANCRELIPASE (LIP-PROT-AMYL) 12000-38000 UNITS PO CPEP: 12000.0000 [IU] | ORAL_CAPSULE | Freq: Three times a day (TID) | ORAL | Status: DC

## 2016-08-23 NOTE — Progress Notes (Signed)
Subjective:    Patient ID: [name] [name], female    DOB: [birth year], 45 y.o.   MRN: [id number]  HPIHere today for f/u. She was last seen by me in January of this year . Amylase was normal. She did not have US done.  She tells me she is not doing great right now. She is under a lot of stress. At home, her daughter is trying to get into college. Her main stress is that her parents are separating. This all has been in the last 2 weeks.  She says this has flared her abdominal pain up. She rates her pain 6/10. She is not taking her pain medication at this time. Has been off the Creon. She tells me every now and then her stools are greasy. She tries to avoid greasy foods. Her appetite is great . There has been no weight loss. Sometimes she says foods feel like they are lodging. This has been occurring for the past couple of weeks. She has breakthru acid reflux once a week. She does not have burning. She has burning in her chest.  [facility] by Dr[facility] Sinda DuKevin Shah for chronic pancreatitis.  [facility] in February did not show pancreatitis.  Her liver enzymes have been elevated. No prior hx of IV drug use. She does have tattoos.  11/28/2015 CT abdomen/pelvis with CM: Impression: 1. No acute abnormality to explain abdominal pain. 2. Postsurgical changes status post Whipple procedure. Interval decrease in size of the fluid collection adjacent to the falciform ligament   Hx of acute/chronic pancreatitis status post Whipple procedure 08/21/2010 by Dr. Cliffton AstersWhite.  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 Cliffton AstersWhite 03/17/2012 from a feeding tube. Symptoms of pancreatitis after GB surgery 2001  [facility] due to the pain.  07/22/2015 Amylase 26, Lipase 18    12/31/2008: Diagnostic EGD.  INDICATIONS FOR PROCEDURE: A patient with intermittent nausea, vomiting, epigastric pain, low volume hematemesis. Also complains of melena  and intermittent hematochezia. No evidence of pancreatitis this admission. Hemoglobin has remained entirely normal over serial assays. Hemoglobin this morning is 14.4. She has been using naproxen and came off her Protonix 1 month ago. EGD is now being done to further evaluate symptoms. Risks, benefits, alternatives and limitations have been reviewed. Please see the documentation in the medical record. IMPRESSION: Undulating Z-line otherwise normal esophagus and intact Nissen fundoplication and diffuse submucosal petechiae likely NSAID effect, otherwise unremarkable gastric mucosa status post biopsy. Patent pylorus and normal D1, D2. Biopsy:   STOMACH, BIOPSY: HELICOBACTER PYLORI GASTRITIS, NO EVIDENCE OF INTESTINAL METAPLASIA, DYSPLASIA OR MALIGNANCY IDENTIFIED.    03/14/2009 Dr. Jena Gaussourk Diagnostic ileocolonoscopy.  INDICATIONS FOR PROCEDURE: A 45 year old lady with chronic constipation and intermittent hematochezia. History chronic pancreatitis on pancreatic enzymes supplementation. IMPRESSION: Anal papilla, minimal external hemorrhoidal tags, otherwise normal rectum, normal colon, normal terminal ileum. I suspect trivial anorectal bleeding of benign etiology.    Review of Systems Past Medical History:  Diagnosis Date  . Acute bacterial bronchitis    dx 12-06-2014  --  FINISHED Z-PAC-  NO FEVER BUT STILL INTERMITTANT  PRODUCTIVE COUGH  . Anxiety   . Chronic pain    has pain specialist  . GERD (gastroesophageal reflux disease)   . History of chronic gastritis   . History of chronic pancreatitis    multple recurrent--  s/p  whipple 2011  . History of esophageal ulcer   . History  of kidney stones   . History of subarachnoid hemorrhage    01-08-2013--  fell on concrete--  resolved without surgical intervention  . History of suicide attempt    hx multiple attempts by overdosing of meds  . IBS (irritable bowel syndrome)   . Left  ureteral calculus   . Major depression, recurrent, chronic (HCC)    w/  Hx  multiple suicide attempts (overdose)  . Migraine headache   . Type 2 diabetes mellitus, uncontrolled (HCC)     Past Surgical History:  Procedure Laterality Date  . CESAREAN SECTION  x2  last one 02-29-2000   last one w/ BILATERAL TUBAL LIGATION  . COLONOSCOPY  03-14-2009   dx ileocolonscopy  . CYSTOSCOPY WITH RETROGRADE PYELOGRAM, URETEROSCOPY AND STENT PLACEMENT Left 12/20/2014   Procedure: CYSTOSCOPY, LEFT URETEROSCOPY, STONE REMOVAL, LASER LITHOTRIPSY;  Surgeon: Barron Alvineavid Grapey, MD;  [facility] [place];  Service: Urology;  Laterality: Left;  . ESOPHAGOGASTRODUODENOSCOPY  last  one 12-31-2008  . HOLMIUM LASER APPLICATION Left 12/20/2014   Procedure: HOLMIUM LASER APPLICATION;  Surgeon: Barron Alvineavid Grapey, MD;  [facility] [place];  Service: Urology;  Laterality: Left;  . LAPAROSCOPIC ASSISTED VAGINAL HYSTERECTOMY  12-16-2006  . LAPAROSCOPIC NISSEN FUNDOPLICATION  Nov 2001   w/  CHOLECYSTECTOMY  . REPAIR PERFORATED VISCUS  POST WHIPPLE  x5    last one 06-23-2012  . [facility]ED[facility]   [facility]    Allergies  Allergen Reactions  . Buspar [Buspirone] Other (See Comments)    Dizziness  . Metoclopramide Hcl Other (See Comments)    REGLAN REACTION: "lock-jaw"    Current Outpatient Prescriptions on File Prior to Visit  Medication Sig Dispense Refill  . Insulin Glargine (LANTUS SOLOSTAR) 100 UNIT/ML Solostar Pen Inject 40 Units into the skin at bedtime.     . naproxen (NAPROSYN) 500 MG tablet TAKE 1 TABLET BY MOUTH TWICE DAILY WITH A MEAL AS NEEDED FOR HEADACHE. 30 tablet 0  . nortriptyline (PAMELOR) 25 MG capsule TAKE 3 CAPSULES BY MOUTH AT BEDTIME. 90 capsule 5  . omeprazole (PRILOSEC) 20 MG capsule TAKE 1 CAPSULE BY MOUTH TWICE DAILY. 30 capsule 5  . ondansetron (ZOFRAN ODT) 4 MG disintegrating tablet Take 1 tablet (4 mg total) by mouth every 8 (eight) hours as needed for  nausea. 20 tablet 1  . oxyCODONE-acetaminophen (PERCOCET/ROXICET) 5-325 MG tablet Take 1 tablet by mouth every 6 hours as needed for pain 24 tablet 0  . promethazine (PHENERGAN) 25 MG tablet TAKE ONE TABLET BY MOUTH EVERY 6 HOURS AS NEEDED FOR NAUSEA OR VOMITING. 30 tablet 5  . sucralfate (CARAFATE) 1 g tablet Take 1 tablet by mouth three times a day in 1 oz of water before meals 90 tablet 0  . topiramate (TOPAMAX) 25 MG tablet TAKE 1 TABLET EVERY MORNING AND 2 TABLETS AT BEDTIME. 90 tablet 5  . zolpidem (AMBIEN) 10 MG tablet TAKE ONE TABLET BY MOUTH AT BEDTIME AS NEEDED FOR SLEEP. 30 tablet 5  . CREON 36000 units CPEP capsule Reported on 01/02/2016    . insulin lispro (HUMALOG) 100 UNIT/ML injection Inject 0-9 Units into the skin 3 (three) times daily. 0-9 Units, Subcutaneous, 3 times daily with meals, CBG < 70: Drink or eat something sweet and recheck blood sugar. CBG 70 - 120: 0 units CBG 121 - 150: 1 unit CBG 151 - 200: 2 units CBG 201 - 250: 3 units CBG 251 - 300: 5 units CBG 301 - 350: 7 units  CBG 351 - 400: 9 units CBG > 400: call MD    . Ebbie Latus PENTIPS 31G X 6 MM MISC      No current facility-administered medications on file prior to visit.        Objective:   Physical Exam Blood pressure 120/80, pulse 80, temperature 98 F (36.7 C), height 5\' 3"  (1.6 m), weight 150 lb 8 oz (68.3 kg).  Alert and oriented. Skin warm and dry. Oral mucosa is moist.   . Sclera anicteric, conjunctivae is pink. Thyroid not enlarged. No cervical lymphadenopathy. Lungs clear. Heart regular rate and rhythm.  Abdomen is soft. Bowel sounds are positive. No hepatomegaly. No abdominal masses felt. No tenderness.  No edema to lower extremities.         Assessment & Plan:  Chronic upper abdominal pain. Am going to get an Amylase and US abdomen. For her hx of pancreatitis.  Elevated liver enzymes: Hepatic function, Hep C antibody. Hep. B surface antigen.  Dysphagia: Am going to get an esophagram.

## 2016-08-23 NOTE — Patient Instructions (Signed)
Labs, and Rx for Creon sent to her pharmacy.

## 2016-08-27 ENCOUNTER — Ambulatory Visit (HOSPITAL_COMMUNITY): Admission: RE | Admit: 2016-08-27 | Payer: Medicare Other | Source: Ambulatory Visit

## 2016-08-27 ENCOUNTER — Telehealth (INDEPENDENT_AMBULATORY_CARE_PROVIDER_SITE_OTHER): Payer: Self-pay | Admitting: *Deleted

## 2016-08-27 NOTE — Telephone Encounter (Signed)
FYI: patient was sch'd for [facility]PE today (08/27/16), she left message that she had sore throat and wouldn't be able to go. BPE has been canceled

## 2016-08-28 ENCOUNTER — Telehealth (INDEPENDENT_AMBULATORY_CARE_PROVIDER_SITE_OTHER): Payer: Self-pay | Admitting: Internal Medicine

## 2016-08-28 ENCOUNTER — Other Ambulatory Visit: Payer: Self-pay | Admitting: Family Medicine

## 2016-08-28 ENCOUNTER — Telehealth: Payer: Self-pay | Admitting: Family Medicine

## 2016-08-28 NOTE — Telephone Encounter (Signed)
Pt is wanting to know if lab orders can be sent in to check her pancreas. Pt states that she is hurting really bad. Please advise.

## 2016-08-28 NOTE — Telephone Encounter (Signed)
Patient called, stated that she saw you Monday and had labs done.  She is in a lot of pain.  She wants to see if you have any suggestions.  [phone]

## 2016-08-28 NOTE — Telephone Encounter (Signed)
Patient advised that Dr Brett CanalesSteve recommends patient to contact her GI doctor concerning a possible flare of pancreatitis. Patient verbalized understanding and stated she will contact her GI doctor.

## 2016-08-28 NOTE — Telephone Encounter (Signed)
Pt recently went and saw the GI folks in the community, that is who should take care of her pancreas

## 2016-08-29 ENCOUNTER — Telehealth (INDEPENDENT_AMBULATORY_CARE_PROVIDER_SITE_OTHER): Payer: Self-pay | Admitting: Internal Medicine

## 2016-08-29 ENCOUNTER — Other Ambulatory Visit (INDEPENDENT_AMBULATORY_CARE_PROVIDER_SITE_OTHER): Payer: Self-pay | Admitting: *Deleted

## 2016-08-29 ENCOUNTER — Encounter (INDEPENDENT_AMBULATORY_CARE_PROVIDER_SITE_OTHER): Payer: Self-pay | Admitting: *Deleted

## 2016-08-29 DIAGNOSIS — K861 Other chronic pancreatitis: Secondary | ICD-10-CM

## 2016-08-29 DIAGNOSIS — R748 Abnormal levels of other serum enzymes: Secondary | ICD-10-CM

## 2016-08-29 MED ORDER — PANTOPRAZOLE SODIUM 40 MG PO TBEC: 40.0000 mg | DELAYED_RELEASE_TABLET | Freq: Every day | ORAL | 5 refills | Status: DC

## 2016-08-29 NOTE — Telephone Encounter (Signed)
Rx for Protonix sent to her pharmacy. 

## 2016-08-29 NOTE — Telephone Encounter (Signed)
She thinks it is her acid reflux. Am going to sent Protonix 40mg  to her pharmacy. Stop the Omeprazole.  She is not having any dysphagia at this time

## 2016-09-04 ENCOUNTER — Other Ambulatory Visit: Payer: Self-pay | Admitting: Family Medicine

## 2016-09-08 ENCOUNTER — Other Ambulatory Visit: Payer: Self-pay | Admitting: Family Medicine

## 2016-09-10 ENCOUNTER — Ambulatory Visit (INDEPENDENT_AMBULATORY_CARE_PROVIDER_SITE_OTHER): Payer: Medicare Other | Admitting: Family Medicine

## 2016-09-10 VITALS — BP 102/68 | Temp 98.2°F | Ht 63.0 in | Wt 151.0 lb

## 2016-09-10 DIAGNOSIS — B9689 Other specified bacterial agents as the cause of diseases classified elsewhere: Secondary | ICD-10-CM

## 2016-09-10 DIAGNOSIS — B338 Other specified viral diseases: Secondary | ICD-10-CM

## 2016-09-10 DIAGNOSIS — J019 Acute sinusitis, unspecified: Secondary | ICD-10-CM

## 2016-09-10 DIAGNOSIS — B348 Other viral infections of unspecified site: Secondary | ICD-10-CM

## 2016-09-10 MED ORDER — DOXYCYCLINE HYCLATE 100 MG PO CAPS: 100.0000 mg | ORAL_CAPSULE | Freq: Two times a day (BID) | ORAL | 0 refills | Status: DC

## 2016-09-10 NOTE — Progress Notes (Signed)
Subjective:     Patient ID: [name] [name], female   DOB: 1971-07-29, 45 y.o.   MRN: [id number]  Sinusitis  This is a new problem. Episode onset: 2 days. Associated symptoms include congestion, coughing, ear pain, headaches and a sore throat. Pertinent negatives include no shortness of breath. Treatments tried: dayquil.   Patient states it started yesterday with body aches headache muscle aches low-grade feeling like she is running a little bit of a fever. Denies vomiting or diarrhea. Relates some cough and some mild headache. PMH diabetes  Review of Systems  Constitutional: Negative for activity change and fever.  HENT: Positive for congestion, ear pain, rhinorrhea and sore throat.   Eyes: Negative for discharge.  Respiratory: Positive for cough. Negative for shortness of breath and wheezing.   Cardiovascular: Negative for chest pain.  Neurological: Positive for headaches.       Objective:   Physical Exam  Constitutional: She appears well-developed.  HENT:  Head: Normocephalic.  Nose: Nose normal.  Mouth/Throat: Oropharynx is clear and moist. No oropharyngeal exudate.  Neck: Neck supple.  Cardiovascular: Normal rate and normal heart sounds.   No murmur heard. Pulmonary/Chest: Effort normal and breath sounds normal. She has no wheezes.  Lymphadenopathy:    She has no cervical adenopathy.  Skin: Skin is warm and dry.  Nursing note and vitals reviewed.      Assessment:     Parainfluenza Viral syndrome Possible bacterial sinusitis    Plan:     Supportive measures over the course of next several days If progressive sinus pressure pain discomfort then get doxycycline filled Does not start antibiotics currently warning signs were discussed in detail

## 2016-09-11 ENCOUNTER — Other Ambulatory Visit: Payer: Self-pay | Admitting: Family Medicine

## 2016-09-20 ENCOUNTER — Other Ambulatory Visit: Payer: Self-pay | Admitting: Family Medicine

## 2016-10-10 ENCOUNTER — Other Ambulatory Visit: Payer: Self-pay | Admitting: Family Medicine

## 2016-10-29 ENCOUNTER — Other Ambulatory Visit: Payer: Self-pay | Admitting: Family Medicine

## 2016-10-31 ENCOUNTER — Ambulatory Visit (INDEPENDENT_AMBULATORY_CARE_PROVIDER_SITE_OTHER): Payer: Medicare Other | Admitting: Internal Medicine

## 2016-10-31 ENCOUNTER — Encounter (INDEPENDENT_AMBULATORY_CARE_PROVIDER_SITE_OTHER): Payer: Self-pay | Admitting: Internal Medicine

## 2016-10-31 ENCOUNTER — Encounter (INDEPENDENT_AMBULATORY_CARE_PROVIDER_SITE_OTHER): Payer: Self-pay

## 2016-10-31 ENCOUNTER — Encounter (INDEPENDENT_AMBULATORY_CARE_PROVIDER_SITE_OTHER): Payer: Self-pay | Admitting: *Deleted

## 2016-10-31 VITALS — BP 94/80 | HR 76 | Temp 98.2°F | Ht 63.0 in | Wt 149.3 lb

## 2016-10-31 DIAGNOSIS — R1011 Right upper quadrant pain: Secondary | ICD-10-CM | POA: Diagnosis not present

## 2016-10-31 LAB — CBC WITH DIFFERENTIAL/PLATELET
BASOS PCT: 0 %
Basophils Absolute: 0 cells/uL (ref 0–200)
EOS PCT: 1 %
Eosinophils Absolute: 73 cells/uL (ref 15–500)
HCT: 45.8 % — ABNORMAL HIGH (ref 35.0–45.0)
Hemoglobin: 15.1 g/dL (ref 11.7–15.5)
LYMPHS ABS: 3066 {cells}/uL (ref 850–3900)
LYMPHS PCT: 42 %
MCH: 32.3 pg (ref 27.0–33.0)
MCHC: 33 g/dL (ref 32.0–36.0)
MCV: 97.9 fL (ref 80.0–100.0)
MONO ABS: 511 {cells}/uL (ref 200–950)
MPV: 10.7 fL (ref 7.5–12.5)
Monocytes Relative: 7 %
Neutro Abs: 3650 cells/uL (ref 1500–7800)
Neutrophils Relative %: 50 %
Platelets: 246 10*3/uL (ref 140–400)
RBC: 4.68 MIL/uL (ref 3.80–5.10)
RDW: 13.2 % (ref 11.0–15.0)
WBC: 7.3 10*3/uL (ref 3.8–10.8)

## 2016-10-31 LAB — HEPATIC FUNCTION PANEL
ALT: 32 U/L — AB (ref 6–29)
AST: 24 U/L (ref 10–35)
Albumin: 3.8 g/dL (ref 3.6–5.1)
Alkaline Phosphatase: 88 U/L (ref 33–115)
BILIRUBIN DIRECT: 0.1 mg/dL (ref <=0.2)
BILIRUBIN INDIRECT: 0.3 mg/dL (ref 0.2–1.2)
BILIRUBIN TOTAL: 0.4 mg/dL (ref 0.2–1.2)
Total Protein: 6.9 g/dL (ref 6.1–8.1)

## 2016-10-31 NOTE — Progress Notes (Signed)
Subjective:    Patient ID: [name] [name], female    DOB: 12-28-1970, 46 y.o.   MRN: [id number] PCP [name] [name] HPI Here today for f/u. Today she present with c/o that for the last few days she has rt upper quadrant pain that radiates into her back.  Describes as a sharp pain. She rates the pain 7/10. There is some nausea and ha been taking Phenergan. Her appetite is not good since the pain started. BMs ar normal. No melena or BRRB. No fever. She says this pain is different that in the past  Cholecystectomy years ago (2001) and developed pancreatitis after surgery.   11/28/2015 CT abdomen/pelvis with CM: Impression: 1. No acute abnormality to explain abdominal pain. 2. Postsurgical changes status post Whipple procedure. Interval decrease in size of the fluid collection adjacent to the falciform ligament  Hx of acute/chronic pancreatitis status post Whipple procedure 08/21/2010 by Dr. Cliffton [name]. 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 Cliffton [name] 03/17/2012 from a feeding tube. Symptoms of pancreatitis after GB surgery 2001  Ventral hernia repair in July of 2016 at [facility] due to the pain.  07/22/2015 Amylase 26, Lipase 18    12/31/2008: Diagnostic EGD.  INDICATIONS FOR PROCEDURE: A patient with intermittent nausea, vomiting, epigastric pain, low volume hematemesis. Also complains of melena and intermittent hematochezia. No evidence of pancreatitis this admission. Hemoglobin has remained entirely normal over serial assays. Hemoglobin this morning is 14.4. She has been using naproxen and came off her Protonix 1 month ago. EGD is now being done to further evaluate symptoms. Risks, benefits, alternatives and limitations have been reviewed. Please see the documentation in the medical record. IMPRESSION: Undulating Z-line otherwise normal esophagus and intact Nissen fundoplication and diffuse submucosal petechiae likely NSAID effect, otherwise  unremarkable gastric mucosa status post biopsy. Patent pylorus and normal D1, D2. Biopsy:   STOMACH, BIOPSY: HELICOBACTER PYLORI GASTRITIS, NO EVIDENCE OF INTESTINAL METAPLASIA, DYSPLASIA OR MALIGNANCY IDENTIFIED.    03/14/2009 Dr. Jena [name] Diagnostic ileocolonoscopy.  INDICATIONS FOR PROCEDURE: A 46 year old lady with chronic constipation and intermittent hematochezia. History chronic pancreatitis on pancreatic enzymes supplementation. IMPRESSION: Anal papilla, minimal external hemorrhoidal tags, otherwise normal rectum, normal colon, normal terminal ileum. I suspect trivial anorectal bleeding of benign etiology.   Review of Systems Past Medical History:  Diagnosis Date  . Acute bacterial bronchitis    dx 12-06-2014  --  FINISHED Z-PAC-  NO FEVER BUT STILL INTERMITTANT  PRODUCTIVE COUGH  . Anxiety   . Chronic pain    has pain specialist  . GERD (gastroesophageal reflux disease)   . History of chronic gastritis   . History of chronic pancreatitis    multple recurrent--  s/p  whipple 2011  . History of esophageal ulcer   . History of kidney stones   . History of subarachnoid hemorrhage    01-08-2013--  fell on concrete--  resolved without surgical intervention  . History of suicide attempt    hx multiple attempts by overdosing of meds  . IBS (irritable bowel syndrome)   . Left ureteral calculus   . Major depression, recurrent, chronic (HCC)    w/  Hx  multiple suicide attempts (overdose)  . Migraine headache   . Type 2 diabetes mellitus, uncontrolled (HCC)     Past Surgical History:  Procedure Laterality Date  . CESAREAN SECTION  x2  last one 02-29-2000   last one w/ BILATERAL TUBAL LIGATION  . COLONOSCOPY  03-14-2009   dx ileocolonscopy  . CYSTOSCOPY WITH RETROGRADE PYELOGRAM,  URETEROSCOPY AND STENT PLACEMENT Left 12/20/2014   Procedure: CYSTOSCOPY, LEFT URETEROSCOPY, STONE REMOVAL, LASER LITHOTRIPSY;  Surgeon: [name] Alvineavid Grapey, MD;  [facility] [place];  Service: Urology;  Laterality: Left;  . ESOPHAGOGASTRODUODENOSCOPY  last  one 12-31-2008  . HOLMIUM LASER APPLICATION Left 12/20/2014   Procedure: HOLMIUM LASER APPLICATION;  Surgeon: [name] Alvineavid Grapey, MD;  [facility] [place];  Service: Urology;  Laterality: Left;  . LAPAROSCOPIC ASSISTED VAGINAL HYSTERECTOMY  12-16-2006  . LAPAROSCOPIC NISSEN FUNDOPLICATION  Nov 2001   w/  CHOLECYSTECTOMY  . REPAIR PERFORATED VISCUS  POST WHIPPLE  x5    last one 06-23-2012  . WHIPPLE PROCEDURE  2011   [facility]    Allergies  Allergen Reactions  . Buspar [Buspirone] Other (See Comments)    Dizziness  . Metoclopramide Hcl Other (See Comments)    REGLAN REACTION: "lock-jaw"    Current Outpatient Prescriptions on File Prior to Visit  Medication Sig Dispense Refill  . Insulin Glargine (LANTUS SOLOSTAR) 100 UNIT/ML Solostar Pen Inject 40 Units into the skin at bedtime.     . insulin lispro (HUMALOG) 100 UNIT/ML injection Inject 0-9 Units into the skin 3 (three) times daily. 0-9 Units, Subcutaneous, 3 times daily with meals, CBG < 70: Drink or eat something sweet and recheck blood sugar. CBG 70 - 120: 0 units CBG 121 - 150: 1 unit CBG 151 - 200: 2 units CBG 201 - 250: 3 units CBG 251 - 300: 5 units CBG 301 - 350: 7 units CBG 351 - 400: 9 units CBG > 400: call MD    . naproxen (NAPROSYN) 500 MG tablet TAKE 1 TABLET BY MOUTH TWICE DAILY WITH A MEAL AS NEEDED FOR HEADACHE. 30 tablet 0  . nortriptyline (PAMELOR) 25 MG capsule TAKE 3 CAPSULES BY MOUTH AT BEDTIME. 90 capsule 2  . ondansetron (ZOFRAN ODT) 4 MG disintegrating tablet Take 1 tablet (4 mg total) by mouth every 8 (eight) hours as needed for nausea. 20 tablet 1  . pantoprazole (PROTONIX) 40 MG tablet Take 1 tablet (40 mg total) by mouth daily. 30 tablet 5  . promethazine (PHENERGAN) 50 MG tablet TAKE 1 TABLET BY MOUTH EVERY 6 HOURS AS NEEDED FOR NAUSEA OR VOMITING. 30 tablet 0  . topiramate (TOPAMAX) 25 MG  tablet TAKE 1 TABLET EVERY MORNING AND 2 TABLETS AT BEDTIME. 90 tablet 2  . zolpidem (AMBIEN) 10 MG tablet TAKE ONE TABLET BY MOUTH AT BEDTIME AS NEEDED FOR SLEEP. 30 tablet 5  . doxycycline (VIBRAMYCIN) 100 MG capsule Take 1 capsule (100 mg total) by mouth 2 (two) times daily. (Patient not taking: Reported on 10/31/2016) 20 capsule 0  . UNIFINE PENTIPS 31G X 6 MM MISC      Current Facility-Administered Medications on File Prior to Visit  Medication Dose Route Frequency Provider Last Rate Last Dose  . lipase/protease/amylase (CREON) capsule 12,000 Units  12,000 Units Oral TID AC Len Blalockerri L Lisa-Marie Rueger, NP           Objective:   Physical Exam Blood pressure 94/80, pulse 76, temperature 98.2 F (36.8 C), height 5\' 3"  (1.6 m), weight 149 lb 4.8 oz (67.7 kg).  Alert and oriented. Skin warm and dry. Oral mucosa is moist.   . Sclera anicteric, conjunctivae is pink. Thyroid not enlarged. No cervical lymphadenopathy. Lungs clear. Heart regular rate and rhythm.  Abdomen is soft. Bowel sounds are positive. No hepatomegaly. No abdominal masses felt. No tenderness.  No edema to lower extremities.  .Marland Kitchen  Assessment & Plan:  RUQ pain. Am going to get an [place], Hepatic function, CBC. Further recommendations to follow. Do not feel this is pancreatitis. May refer back to [facility]

## 2016-10-31 NOTE — Patient Instructions (Signed)
Labs and US. 

## 2016-11-05 ENCOUNTER — Other Ambulatory Visit: Payer: Self-pay | Admitting: Family Medicine

## 2016-11-05 ENCOUNTER — Ambulatory Visit (HOSPITAL_COMMUNITY)
Admission: RE | Admit: 2016-11-05 | Discharge: 2016-11-05 | Disposition: A | Discharge status: Home / Self Care | Payer: Medicare Other | Source: Ambulatory Visit | Attending: Internal Medicine | Admitting: Internal Medicine

## 2016-11-05 DIAGNOSIS — R1011 Right upper quadrant pain: Secondary | ICD-10-CM

## 2016-11-05 DIAGNOSIS — Z9049 Acquired absence of other specified parts of digestive tract: Secondary | ICD-10-CM | POA: Diagnosis not present

## 2016-11-05 DIAGNOSIS — K760 Fatty (change of) liver, not elsewhere classified: Secondary | ICD-10-CM | POA: Insufficient documentation

## 2016-11-05 NOTE — Telephone Encounter (Signed)
Ok plus five monthly ref 

## 2016-11-15 ENCOUNTER — Telehealth: Payer: Self-pay | Admitting: Family Medicine

## 2016-11-15 MED ORDER — OSELTAMIVIR PHOSPHATE 75 MG PO CAPS: ORAL_CAPSULE | ORAL | 0 refills | Status: DC

## 2016-11-15 NOTE — Telephone Encounter (Signed)
Spoke with patient and informed her per Dr.Steve Luking-We are sending in Tamiflu to your pharmacy. Patient verbalized understanding.

## 2016-11-15 NOTE — Telephone Encounter (Signed)
Patients daughter [name]([name]) saw Dr. Brett CanalesSteve yesterday and diagnosed with the flu.  Somer said she was told that she could call in if she started developing symptoms and we could call something in.  She said she is having a cough, sore throat, ear pain, nausea.  [facility]

## 2016-11-15 NOTE — Telephone Encounter (Signed)
tamiflu 75 bid five d 

## 2016-11-30 ENCOUNTER — Telehealth: Payer: Self-pay | Admitting: Family Medicine

## 2016-11-30 ENCOUNTER — Other Ambulatory Visit: Payer: Self-pay | Admitting: *Deleted

## 2016-11-30 MED ORDER — AMOXICILLIN 500 MG PO CAPS: 500.0000 mg | ORAL_CAPSULE | Freq: Three times a day (TID) | ORAL | 0 refills | Status: DC

## 2016-11-30 NOTE — Telephone Encounter (Signed)
Med sent to pharm. Pt notified.  

## 2016-11-30 NOTE — Telephone Encounter (Signed)
Here with grandson yesterday (Colt Willadsen) and he was diagnosed with strep throat. Now she says her throat hurts and wanted to see if we could call in an antibiotic for her just in case.  She uses [facility].  Pt's # is [phone].

## 2016-11-30 NOTE — Telephone Encounter (Signed)
Sore throat, low grade fever just started today,

## 2016-11-30 NOTE — Telephone Encounter (Signed)
Typically for this we recommend amoxicillin 500 mg 1 3 times a day for 10 days

## 2016-12-06 ENCOUNTER — Other Ambulatory Visit: Payer: Self-pay | Admitting: Family Medicine

## 2016-12-13 ENCOUNTER — Other Ambulatory Visit: Payer: Self-pay | Admitting: Family Medicine

## 2016-12-14 NOTE — Telephone Encounter (Signed)
Last seen 09/10/16 (sick)

## 2016-12-17 NOTE — Telephone Encounter (Signed)
Dr Soyla DryerSteve's please

## 2017-01-02 ENCOUNTER — Other Ambulatory Visit: Payer: Self-pay | Admitting: Family Medicine

## 2017-01-11 ENCOUNTER — Encounter: Payer: Self-pay | Admitting: Family Medicine

## 2017-01-11 ENCOUNTER — Ambulatory Visit (INDEPENDENT_AMBULATORY_CARE_PROVIDER_SITE_OTHER): Payer: Medicare Other | Admitting: Family Medicine

## 2017-01-11 VITALS — BP 112/82 | Ht 63.0 in | Wt 149.0 lb

## 2017-01-11 DIAGNOSIS — N200 Calculus of kidney: Secondary | ICD-10-CM | POA: Diagnosis not present

## 2017-01-11 DIAGNOSIS — R300 Dysuria: Secondary | ICD-10-CM

## 2017-01-11 DIAGNOSIS — R109 Unspecified abdominal pain: Secondary | ICD-10-CM | POA: Diagnosis not present

## 2017-01-11 LAB — POCT URINALYSIS DIPSTICK
PH UA: 6 (ref 5.0–8.0)
Spec Grav, UA: 1.015 (ref 1.030–1.035)

## 2017-01-11 MED ORDER — OXYCODONE-ACETAMINOPHEN 10-325 MG PO TABS: 1.0000 | ORAL_TABLET | Freq: Four times a day (QID) | ORAL | 0 refills | Status: DC | PRN

## 2017-01-11 MED ORDER — PROMETHAZINE HCL 50 MG PO TABS: ORAL_TABLET | ORAL | 0 refills | Status: DC

## 2017-01-11 NOTE — Progress Notes (Signed)
   Subjective:    Patient ID: [name] [name], female    DOB: [birth year], 46 y.o.   MRN: [id number]  HPI  Patient arrives with c/o kidney stone that woke her up at 3 am this am  Woke up with severe pain , right flank, severe at times. Comes and goes. Also cramping. Radiates towards right lower quadrant. Associated with dysuria. Associated with increased frequency. No noticeable hematuria.  Review of chart shows long-standing history of kidney stones.  Patient also has history of pancreatitis.  Results for orders placed or performed in visit on 01/11/17  POCT urinalysis dipstick  Result Value Ref Range   Color, UA     Clarity, UA     Glucose, UA     Bilirubin, UA     Ketones, UA     Spec Grav, UA 1.015 1.030 - 1.035   Blood, UA moderate    pH, UA 6.0 5.0 - 8.0   Protein, UA     Urobilinogen, UA  Negative - 2.0   Nitrite, UA     Leukocytes, UA  Negative    Pos kid stone in the past yr  Took otc    Pain pill  Took henergan  Pain comin and going, moves around some   No substantial vomiting no significant nausea  Review of Systems No headache, no major weight loss or weight gain, no chest pain no back pain abdominal pain no change in bowel habits complete ROS otherwise negative     Objective:   Physical Exam  Alert and oriented, vitals reviewed and stable, Positive discomfort obvious ENT-TM's and ext canals WNL bilat via otoscopic exam Soft palate, tonsils and post pharynx WNL via oropharyngeal exam Neck-symmetric, no masses; thyroid nonpalpable and nontender Pulmonary-no tachypnea or accessory muscle use; Clear without wheezes via auscultation Card--no abnrml murmurs, rhythm reg and rate WNL Carotid pulses symmetric, without bruits Distinct right CVA tenderness right lateral abdominal tenderness to deep palpation  Urinalysis multiple red blood cells. No white blood cells      Assessment & Plan:  Impression probable kidney stone right flank. History of these.  Increase hydration. Phenergan when necessary for nausea. Ibuprofen rationale discussed. Warning signs discussed carefully. No need for acute scan at this time. Hydrate. Oxycodone when necessary for pain. Pain registry reviewed

## 2017-02-08 ENCOUNTER — Other Ambulatory Visit: Payer: Self-pay | Admitting: Family Medicine

## 2017-02-27 ENCOUNTER — Other Ambulatory Visit: Payer: Self-pay | Admitting: *Deleted

## 2017-02-27 ENCOUNTER — Telehealth: Payer: Self-pay | Admitting: Family Medicine

## 2017-02-27 ENCOUNTER — Other Ambulatory Visit: Payer: Self-pay | Admitting: Family Medicine

## 2017-02-27 MED ORDER — NORTRIPTYLINE HCL 25 MG PO CAPS: ORAL_CAPSULE | ORAL | 0 refills | Status: DC

## 2017-02-27 MED ORDER — NORTRIPTYLINE HCL 25 MG PO CAPS: ORAL_CAPSULE | ORAL | 5 refills | Status: DC

## 2017-02-27 NOTE — Telephone Encounter (Signed)
Pt taking nortiptyline 25mg  3 at bedtime. Would like to increase dose. States both legs have been bothering her for the past few weeks.

## 2017-02-27 NOTE — Telephone Encounter (Signed)
Patient is requesting an increase on the dosage on her Nortriptyline to [facility].

## 2017-02-27 NOTE — Telephone Encounter (Signed)
incr to 100 mg six mo worth

## 2017-02-27 NOTE — Telephone Encounter (Signed)
Med sent pharm. Pt notified 

## 2017-03-01 ENCOUNTER — Other Ambulatory Visit (INDEPENDENT_AMBULATORY_CARE_PROVIDER_SITE_OTHER): Payer: Self-pay | Admitting: Internal Medicine

## 2017-03-11 ENCOUNTER — Other Ambulatory Visit: Payer: Self-pay | Admitting: Family Medicine

## 2017-03-11 NOTE — Telephone Encounter (Signed)
Last seen 01/11/17

## 2017-04-15 ENCOUNTER — Other Ambulatory Visit: Payer: Self-pay | Admitting: Family Medicine

## 2017-04-15 NOTE — Telephone Encounter (Signed)
Last seen 01/11/17

## 2017-04-29 ENCOUNTER — Encounter (HOSPITAL_COMMUNITY): Payer: Self-pay

## 2017-04-29 ENCOUNTER — Emergency Department (HOSPITAL_COMMUNITY)
Admission: EM | Admit: 2017-04-29 | Discharge: 2017-04-29 | Disposition: A | Discharge status: Home / Self Care | Payer: Medicare HMO | Attending: Emergency Medicine | Admitting: Emergency Medicine

## 2017-04-29 ENCOUNTER — Ambulatory Visit: Payer: Medicare Other | Admitting: Family Medicine

## 2017-04-29 ENCOUNTER — Emergency Department (HOSPITAL_COMMUNITY): Payer: Medicare HMO

## 2017-04-29 DIAGNOSIS — R1013 Epigastric pain: Secondary | ICD-10-CM | POA: Diagnosis not present

## 2017-04-29 DIAGNOSIS — K861 Other chronic pancreatitis: Secondary | ICD-10-CM | POA: Insufficient documentation

## 2017-04-29 DIAGNOSIS — K760 Fatty (change of) liver, not elsewhere classified: Secondary | ICD-10-CM | POA: Diagnosis not present

## 2017-04-29 DIAGNOSIS — E1165 Type 2 diabetes mellitus with hyperglycemia: Secondary | ICD-10-CM | POA: Insufficient documentation

## 2017-04-29 DIAGNOSIS — Z9049 Acquired absence of other specified parts of digestive tract: Secondary | ICD-10-CM | POA: Insufficient documentation

## 2017-04-29 DIAGNOSIS — R109 Unspecified abdominal pain: Secondary | ICD-10-CM | POA: Diagnosis present

## 2017-04-29 DIAGNOSIS — K439 Ventral hernia without obstruction or gangrene: Secondary | ICD-10-CM | POA: Diagnosis not present

## 2017-04-29 LAB — CBC WITH DIFFERENTIAL/PLATELET
BASOS ABS: 0 10*3/uL (ref 0.0–0.1)
Basophils Relative: 0 %
EOS ABS: 0 10*3/uL (ref 0.0–0.7)
EOS PCT: 0 %
HEMATOCRIT: 43.7 % (ref 36.0–46.0)
Hemoglobin: 14.7 g/dL (ref 12.0–15.0)
LYMPHS ABS: 1.7 10*3/uL (ref 0.7–4.0)
Lymphocytes Relative: 11 %
MCH: 32.8 pg (ref 26.0–34.0)
MCHC: 33.6 g/dL (ref 30.0–36.0)
MCV: 97.5 fL (ref 78.0–100.0)
Monocytes Absolute: 0.5 10*3/uL (ref 0.1–1.0)
Monocytes Relative: 4 %
Neutro Abs: 12.6 10*3/uL — ABNORMAL HIGH (ref 1.7–7.7)
Neutrophils Relative %: 85 %
Platelets: 208 10*3/uL (ref 150–400)
RBC: 4.48 MIL/uL (ref 3.87–5.11)
RDW: 13 % (ref 11.5–15.5)
WBC: 14.8 10*3/uL — AB (ref 4.0–10.5)

## 2017-04-29 LAB — URINALYSIS, ROUTINE W REFLEX MICROSCOPIC
Bilirubin Urine: NEGATIVE
KETONES UR: NEGATIVE mg/dL
LEUKOCYTES UA: NEGATIVE
NITRITE: NEGATIVE
PROTEIN: NEGATIVE mg/dL
Specific Gravity, Urine: 1.017 (ref 1.005–1.030)
pH: 5 (ref 5.0–8.0)

## 2017-04-29 LAB — COMPREHENSIVE METABOLIC PANEL
ALK PHOS: 113 U/L (ref 38–126)
ALT: 52 U/L (ref 14–54)
AST: 49 U/L — ABNORMAL HIGH (ref 15–41)
Albumin: 3.5 g/dL (ref 3.5–5.0)
Anion gap: 8 (ref 5–15)
BILIRUBIN TOTAL: 1.1 mg/dL (ref 0.3–1.2)
BUN: 13 mg/dL (ref 6–20)
CALCIUM: 8 mg/dL — AB (ref 8.9–10.3)
CO2: 17 mmol/L — ABNORMAL LOW (ref 22–32)
CREATININE: 0.54 mg/dL (ref 0.44–1.00)
Chloride: 106 mmol/L (ref 101–111)
Glucose, Bld: 253 mg/dL — ABNORMAL HIGH (ref 65–99)
Potassium: 4.6 mmol/L (ref 3.5–5.1)
Sodium: 131 mmol/L — ABNORMAL LOW (ref 135–145)
TOTAL PROTEIN: 6.7 g/dL (ref 6.5–8.1)

## 2017-04-29 LAB — LIPASE, BLOOD: LIPASE: 16 U/L (ref 11–51)

## 2017-04-29 MED ORDER — ONDANSETRON HCL 4 MG/2ML IJ SOLN
4.0000 mg | Freq: Once | INTRAMUSCULAR | Status: AC
  Administered 2017-04-29: 4 mg via INTRAVENOUS
  Filled 2017-04-29: qty 2

## 2017-04-29 MED ORDER — MORPHINE SULFATE (PF) 4 MG/ML IV SOLN
4.0000 mg | Freq: Once | INTRAVENOUS | Status: AC
  Administered 2017-04-29: 4 mg via INTRAVENOUS
  Filled 2017-04-29: qty 1

## 2017-04-29 MED ORDER — SODIUM CHLORIDE 0.9 % IV BOLUS (SEPSIS)
1000.0000 mL | Freq: Once | INTRAVENOUS | Status: AC
  Administered 2017-04-29: 1000 mL via INTRAVENOUS

## 2017-04-29 MED ORDER — OXYCODONE-ACETAMINOPHEN 5-325 MG PO TABS
1.0000 | ORAL_TABLET | Freq: Once | ORAL | Status: AC
  Administered 2017-04-29: 1 via ORAL
  Filled 2017-04-29: qty 1

## 2017-04-29 MED ORDER — IOPAMIDOL (ISOVUE-300) INJECTION 61%
100.0000 mL | Freq: Once | INTRAVENOUS | Status: AC | PRN
  Administered 2017-04-29: 100 mL via INTRAVENOUS

## 2017-04-29 MED ORDER — OXYCODONE-ACETAMINOPHEN 5-325 MG PO TABS: 1.0000 | ORAL_TABLET | Freq: Four times a day (QID) | ORAL | 0 refills | Status: DC | PRN

## 2017-04-29 MED ORDER — ONDANSETRON 4 MG PO TBDP
4.0000 mg | ORAL_TABLET | Freq: Three times a day (TID) | ORAL | 0 refills | Status: DC | PRN
Start: 2017-04-29 — End: 2021-08-30

## 2017-04-29 NOTE — Discharge Instructions (Signed)
You were seen in the ED today with abdominal pain. You have a small hernia from one of your prior operations. D[facility]. Follow up with your PCP in the next 1-2 days.   Take medication only as directed. Do not drive while taking Percocet. Return to the ED with any worsening abdominal pain, vomiting, or other concerning symptoms.

## 2017-04-29 NOTE — ED Provider Notes (Signed)
Emergency Department Provider Note   I have reviewed the triage vital signs and the nursing notes.   HISTORY  Chief Complaint Abdominal Pain and Nausea   HPI [name] [name] is a 46 y.o. female with PMH of chronic pancreatitis s/p Whipple 2011, GERD, s/p lap cholecystectomy presents to the emergency room in for evaluation of epigastric abdominal pain and nausea. The patient reports sudden onset of symptoms after eating dinner yesterday evening. She had continued pain throughout the night and difficulty sleeping. This morning her pain and nausea continued. She denies any actual vomiting. No diarrhea. She continues to have bowel movements and pass flatus. No lower abdominal pain. Patient denies any radiation of pain into the chest or difficulty breathing. She states that the intensity of pain feels similar to her prior pancreatitis episodes. She had a Whipple procedure in 2011 because of chronic pancreatitis. No fever or chills. No dysuria, hesitancy, or urgency. Pain radiates from the mid-epigastric region to the left and right.   Past Medical History:  Diagnosis Date  . Acute bacterial bronchitis    dx 12-06-2014  --  FINISHED Z-PAC-  NO FEVER BUT STILL INTERMITTANT  PRODUCTIVE COUGH  . Anxiety   . Chronic pain    has pain specialist  . GERD (gastroesophageal reflux disease)   . History of chronic gastritis   . History of chronic pancreatitis    multple recurrent--  s/p  whipple 2011  . History of esophageal ulcer   . History of kidney stones   . History of subarachnoid hemorrhage    01-08-2013--  fell on concrete--  resolved without surgical intervention  . History of suicide attempt    hx multiple attempts by overdosing of meds  . IBS (irritable bowel syndrome)   . Left ureteral calculus   . Major depression, recurrent, chronic (HCC)    w/  Hx  multiple suicide attempts (overdose)  . Migraine headache   . Type 2 diabetes mellitus, uncontrolled (HCC)     Patient Active  Problem List   Diagnosis Date Noted  . Chronic pancreatitis (HCC) 08/19/2016  . Ureteral calculus 12/20/2014  . Insomnia 06/28/2014  . DM type 2 (diabetes mellitus, type 2) (HCC) 08/13/2013  . Urinary retention 08/13/2013  . Hypotension 05/20/2013  . Ileus (HCC) 05/19/2013  . Abdominal pain 05/19/2013  . Hyponatremia 05/19/2013  . Hyperglycemia 05/19/2013  . Elevated transaminase level 05/19/2013  . Diabetes 01/22/2013  . Subarachnoid bleed (HCC) 01/09/2013  . Fall involving sidewalk curb 01/09/2013  . Contusion 01/09/2013  . Pain management 12/25/2012  . Portal vein thrombosis 05/13/2012  . Lesion of liver 05/13/2012  . Constipation 05/13/2012  . BIPOLAR DISORDER UNSPECIFIED 02/03/2009  . Migraine 02/03/2009  . History of digestive system disease 02/03/2009  . Esophageal reflux 02/03/2009  . RENAL CALCULUS, HX OF 02/03/2009  . ANKLE SPRAIN 11/29/2008    Past Surgical History:  Procedure Laterality Date  . CESAREAN SECTION  x2  last one 02-29-2000   last one w/ BILATERAL TUBAL LIGATION  . COLONOSCOPY  03-14-2009   dx ileocolonscopy  . CYSTOSCOPY WITH RETROGRADE PYELOGRAM, URETEROSCOPY AND STENT PLACEMENT Left 12/20/2014   Procedure: CYSTOSCOPY, LEFT URETEROSCOPY, STONE REMOVAL, LASER LITHOTRIPSY;  Surgeon: Barron Alvine, MD;  Location: [facility];  Service: Urology;  Laterality: Left;  . ESOPHAGOGASTRODUODENOSCOPY  last  one 12-31-2008  . HOLMIUM LASER APPLICATION Left 12/20/2014   Procedure: HOLMIUM LASER APPLICATION;  Surgeon: Barron Alvine, MD;  Location: [facility];  Service:  Urology;  Laterality: Left;  . LAPAROSCOPIC ASSISTED VAGINAL HYSTERECTOMY  12-16-2006  . LAPAROSCOPIC NISSEN FUNDOPLICATION  Nov 2001   w/  CHOLECYSTECTOMY  . REPAIR PERFORATED VISCUS  POST WHIPPLE  x5    last one 06-23-2012  . WHIPPLE PROCEDURE  2011   [facility]    Current Outpatient Rx  . Order #: [id number] Class: Historical Med  . Order #: [id number] Class:  Historical Med  . Order #: [id number] Class: Normal  . Order #: [id number] Class: Normal  . Order #: [id number] Class: Normal  . Order #: [id number] Class: Normal  . Order #: [id number] Class: Normal  . Order #: [id number] Class: Print  . Order #: [id number] Class: Normal  . Order #: [id number] Class: Normal  . Order #: [id number] Class: Print  . Order #: [id number] Class: Normal  . Order #: [id number] Class: Print  . Order #: [id number] Class: Historical Med    Allergies Buspar [buspirone] and Metoclopramide hcl  Family History  Problem Relation Age of Onset  . Diabetes Father     Social History Social History  Substance Use Topics  . Smoking status: Never Smoker  . Smokeless tobacco: Never Used  . Alcohol use No    Review of Systems  Constitutional: No fever/chills Eyes: No visual changes. ENT: No sore throat. Cardiovascular: Denies chest pain. Respiratory: Denies shortness of breath. Gastrointestinal: No abdominal pain.  No nausea, no vomiting.  No diarrhea.  No constipation. Genitourinary: Negative for dysuria. Musculoskeletal: Negative for back pain. Skin: Negative for rash. Neurological: Negative for headaches, focal weakness or numbness.  10-point ROS otherwise negative.  ____________________________________________   PHYSICAL EXAM:  VITAL SIGNS: ED Triage Vitals [04/29/17 1236]  Enc Vitals Group     BP 119/82     Pulse Rate (!) 106     Resp 16     Temp (!) 97.2 F (36.2 C)     Temp Source Oral     SpO2 100 %     Weight 146 lb (66.2 kg)     Height 5\' 3"  (1.6 m)     Pain Score 8   Constitutional: Alert and oriented. Well appearing and in no acute distress. Eyes: Conjunctivae are normal. Head: Atraumatic. Nose: No congestion/rhinnorhea. Mouth/Throat: Mucous membranes are moist.  Oropharynx non-erythematous. Neck: No stridor. Cardiovascular: Normal rate, regular rhythm. Good peripheral circulation. Grossly normal heart sounds.   Respiratory: Normal respiratory  effort.  No retractions. Lungs CTAB. Gastrointestinal: Soft with moderate mid-epigastric and RUQ pain. No rebound or guarding.  Musculoskeletal: No lower extremity tenderness nor edema. No gross deformities of extremities. Neurologic:  Normal speech and language. No gross focal neurologic deficits are appreciated.  Skin:  Skin is warm, dry and intact. No rash noted. Psychiatric: Mood and affect are normal. Speech and behavior are normal.  ____________________________________________   LABS (all labs ordered are listed, but only abnormal results are displayed)  Labs Reviewed  COMPREHENSIVE METABOLIC PANEL - Abnormal; Notable for the following:       Result Value   Sodium 131 (*)    CO2 17 (*)    Glucose, Bld 253 (*)    Calcium 8.0 (*)    AST 49 (*)    All other components within normal limits  CBC WITH DIFFERENTIAL/PLATELET - Abnormal; Notable for the following:    WBC 14.8 (*)    Neutro Abs 12.6 (*)    All other components within normal limits  URINALYSIS, ROUTINE W REFLEX MICROSCOPIC - Abnormal; Notable for the following:    APPearance HAZY (*)    Glucose, UA >=  500 (*)    Hgb urine dipstick MODERATE (*)    Bacteria, UA RARE (*)    Squamous Epithelial / LPF 0-5 (*)    All other components within normal limits  LIPASE, BLOOD   ____________________________________________  RADIOLOGY  Ct Abdomen Pelvis W Contrast  Result Date: 04/29/2017 CLINICAL DATA:  Epigastric pain for 2 days, nausea, prior cholecystectomy and Whipple procedure, type II diabetes mellitus, GERD EXAM: CT ABDOMEN AND PELVIS WITH CONTRAST TECHNIQUE: Multidetector CT imaging of the abdomen and pelvis was performed using the standard protocol following bolus administration of intravenous contrast. Sagittal and coronal MPR images reconstructed from axial data set. CONTRAST:  ISOVUE-300 IOPAMIDOL (ISOVUE-300) INJECTION 61% IV. No oral contrast. COMPARISON:  11/18/2014 FINDINGS: Lower chest: Lung bases clear  Hepatobiliary: Marked fatty infiltration of liver. Pneumobilia consistent with prior Whipple procedure and choledochal jejunostomy. No biliary dilatation or hepatic mass. Focal fluid collection again identified at the anterior aspect of the liver 4.7 x 3.8 cm image 27 little changed question postoperative seroma, lymphocele, or old hematoma. Pancreas: Atrophic pancreatic tail. Prior resection of proximal pancreas. Spleen: Normal appearance Adrenals/Urinary Tract: Adrenal glands, kidneys, ureters and bladder normal appearance. Stomach/Bowel: Post antrectomy, duodenectomy and gastrojejunostomy. Normal appendix. Scattered stool throughout colon to rectum. Extension of a single wall of the mid transverse colon into a supraumbilical ventral hernia, Richter type hernia. Additionally, a large collateral vessel extends into this same supraumbilical ventral hernia. Remaining bowel loops unremarkable. Vascular/Lymphatic: Scattered pelvic phleboliths. Aorta normal caliber. Portal vein, splenic vein and superior mesenteric vein patent. Large collateral LEFT abdomen. Scattered normal sized and upper normal size mesenteric lymph nodes without abdominal or pelvic adenopathy. Reproductive: Uterus surgically absent. Small cyst LEFT ovary. Ovaries otherwise unremarkable. Other: No free air or free fluid. Supraumbilical ventral hernia as previously noted. Musculoskeletal: Unremarkable IMPRESSION: Marked fatty infiltration of liver. Pneumobilia consistent with prior Whipple procedure with choledochojejunostomy. New line stable fluid collection at the anterior aspect of the liver question postoperative seroma, lymphocele, or old hematoma Supraumbilical ventral hernia containing a Richter type hernia of the mid transverse colon without bowel obstruction. No additional intra-abdominal or intrapelvic abnormalities. Electronically Signed   By: Ulyses Southward M.D.   On: 04/29/2017 15:41     ____________________________________________   PROCEDURES  Procedure(s) performed:   Procedures  None ____________________________________________   INITIAL IMPRESSION / ASSESSMENT AND PLAN / ED COURSE  Pertinent labs & imaging results that were available during my care of the patient were reviewed by me and considered in my medical decision making (see chart for details).  Patient presents to the emergency department for evaluation of epigastric discomfort and nausea. Symptoms began after eating. She's had a laparoscopic cholecystectomy and history of a Whipple procedure in 2011 after chronic pancreatitis. Given the patient's consultative past medical history my differential remains broad and includes gastritis, pancreatitis, and bile duct stone. Very low suspicion for vascular etiology of pain or atypical ACS presentation. Plan for labs, IVF, pain/nausea control, and CT abdomen and pelvis for further evaluation.   04:01 PM Patient lab work remarkable for mild leukocytosis of unclear significance. CT shows hernia not causing SBO. Advised surgery f/u. Discharged with short course of pain medication and nausea medication.   At this time, I do not feel there is any life-threatening condition present. I have reviewed and discussed all results (EKG, imaging, lab, urine as appropriate), exam findings with patient. I have reviewed nursing notes and appropriate previous records.  I feel the patient is safe to be  discharged home without further emergent workup. Discussed usual and customary return precautions. Patient and family (if present) verbalize understanding and are comfortable with this plan.  Patient will follow-up with their primary care provider. If they do not have a primary care provider, information for follow-up has been provided to them. All questions have been answered.  ____________________________________________  FINAL CLINICAL IMPRESSION(S) / ED DIAGNOSES  Final  diagnoses:  Epigastric abdominal pain     MEDICATIONS GIVEN DURING THIS VISIT:  Medications  sodium chloride 0.9 % bolus 1,000 mL (0 mLs Intravenous Stopped 04/29/17 1601)  morphine 4 MG/ML injection 4 mg (4 mg Intravenous Given 04/29/17 1323)  ondansetron (ZOFRAN) injection 4 mg (4 mg Intravenous Given 04/29/17 1323)  iopamidol (ISOVUE-300) 61 % injection 100 mL (100 mLs Intravenous Contrast Given 04/29/17 1521)     NEW OUTPATIENT MEDICATIONS STARTED DURING THIS VISIT:  New Prescriptions   ONDANSETRON (ZOFRAN ODT) 4 MG DISINTEGRATING TABLET    Take 1 tablet (4 mg total) by mouth every 8 (eight) hours as needed for nausea or vomiting.   OXYCODONE-ACETAMINOPHEN (PERCOCET/ROXICET) 5-325 MG TABLET    Take 1 tablet by mouth every 6 (six) hours as needed for severe pain.      Note:  This document was prepared using Dragon voice recognition software and may include unintentional dictation errors.  Alona Bene, MD Emergency Medicine    Sydne Krahl, Arlyss Repress, MD 04/29/17 [id number]

## 2017-04-29 NOTE — ED Triage Notes (Signed)
Reports of RUQ pain that radiates to back with nausea that started last night. Reports of increased pain after eating.

## 2017-05-01 ENCOUNTER — Other Ambulatory Visit: Payer: Self-pay | Admitting: Family Medicine

## 2017-05-01 NOTE — Telephone Encounter (Signed)
Last seen 01/11/17

## 2017-05-08 ENCOUNTER — Encounter: Payer: Self-pay | Admitting: Family Medicine

## 2017-05-08 ENCOUNTER — Ambulatory Visit (INDEPENDENT_AMBULATORY_CARE_PROVIDER_SITE_OTHER): Payer: Medicare HMO | Admitting: Family Medicine

## 2017-05-08 VITALS — BP 102/64 | Temp 98.3°F | Ht 63.0 in | Wt 149.0 lb

## 2017-05-08 DIAGNOSIS — E118 Type 2 diabetes mellitus with unspecified complications: Secondary | ICD-10-CM

## 2017-05-08 DIAGNOSIS — Z794 Long term (current) use of insulin: Secondary | ICD-10-CM

## 2017-05-08 DIAGNOSIS — R1013 Epigastric pain: Secondary | ICD-10-CM | POA: Diagnosis not present

## 2017-05-08 MED ORDER — OXYCODONE-ACETAMINOPHEN 5-325 MG PO TABS: 1.0000 | ORAL_TABLET | Freq: Four times a day (QID) | ORAL | 0 refills | Status: DC | PRN

## 2017-05-08 NOTE — Progress Notes (Signed)
   Subjective:    Patient ID: [name] [name], female    DOB: 1971/09/03, 46 y.o.   MRN: [id number]  Abdominal Pain  This is a new problem. Episode onset: one week. Associated symptoms include nausea. Treatments tried: oxycodone.  This patient is diabetic she has not been seen her diabetic doctor recently. It is been a while since she's had A1c done She relates intense abdominal pain comes and goes denies high fever chills she does relate nausea she has had a Whipple procedure in the past she states her bowels been okay denies any blood in her bowel movements is using anti-inflammatories. Went to ED on 04/29/17 for abd pain. Got better. Pain came back today.    Review of Systems  Gastrointestinal: Positive for abdominal pain and nausea.  Denies high fever chills cough bloody stools denies joint pain relates epigastric pain     Objective:   Physical Exam Lungs clear no crackles heart regular pulse normal abdomen soft moderate epigastric tenderness no guarding rebound otherwise extremities no edema   CT scan lab work reviewed from ER    Assessment & Plan:  Complicated patient Epigastric pain Left upper quadrant pain On PPI Stop anti-inflammatory Pain medication given for short-term use caution drowsiness home use only Lab work indicated await results May need GI consultation Dr.Rheman may well need EGD

## 2017-05-09 ENCOUNTER — Other Ambulatory Visit (INDEPENDENT_AMBULATORY_CARE_PROVIDER_SITE_OTHER): Payer: Self-pay | Admitting: Internal Medicine

## 2017-05-09 DIAGNOSIS — G8929 Other chronic pain: Secondary | ICD-10-CM | POA: Insufficient documentation

## 2017-05-09 DIAGNOSIS — R1013 Epigastric pain: Secondary | ICD-10-CM

## 2017-05-09 LAB — HEMOGLOBIN A1C
Est. average glucose Bld gHb Est-mCnc: 160 mg/dL
HEMOGLOBIN A1C: 7.2 % — AB (ref 4.8–5.6)

## 2017-05-09 LAB — HEPATIC FUNCTION PANEL
ALBUMIN: 4.3 g/dL (ref 3.5–5.5)
ALK PHOS: 136 IU/L — AB (ref 39–117)
ALT: 51 IU/L — ABNORMAL HIGH (ref 0–32)
AST: 48 IU/L — ABNORMAL HIGH (ref 0–40)
BILIRUBIN, DIRECT: 0.19 mg/dL (ref 0.00–0.40)
Bilirubin Total: 0.5 mg/dL (ref 0.0–1.2)
TOTAL PROTEIN: 7.6 g/dL (ref 6.0–8.5)

## 2017-05-09 LAB — CBC WITH DIFFERENTIAL/PLATELET
BASOS ABS: 0 10*3/uL (ref 0.0–0.2)
Basos: 0 %
EOS (ABSOLUTE): 0.1 10*3/uL (ref 0.0–0.4)
Eos: 1 %
HEMOGLOBIN: 15.4 g/dL (ref 11.1–15.9)
Hematocrit: 46.6 % (ref 34.0–46.6)
IMMATURE GRANS (ABS): 0 10*3/uL (ref 0.0–0.1)
IMMATURE GRANULOCYTES: 0 %
LYMPHS: 40 %
Lymphocytes Absolute: 3.3 10*3/uL — ABNORMAL HIGH (ref 0.7–3.1)
MCH: 32.8 pg (ref 26.6–33.0)
MCHC: 33 g/dL (ref 31.5–35.7)
MCV: 99 fL — ABNORMAL HIGH (ref 79–97)
MONOCYTES: 4 %
Monocytes Absolute: 0.3 10*3/uL (ref 0.1–0.9)
NEUTROS PCT: 55 %
Neutrophils Absolute: 4.7 10*3/uL (ref 1.4–7.0)
PLATELETS: 239 10*3/uL (ref 150–379)
RBC: 4.69 x10E6/uL (ref 3.77–5.28)
RDW: 13.6 % (ref 12.3–15.4)
WBC: 8.4 10*3/uL (ref 3.4–10.8)

## 2017-05-09 LAB — BASIC METABOLIC PANEL
BUN/Creatinine Ratio: 17 (ref 9–23)
BUN: 10 mg/dL (ref 6–24)
CHLORIDE: 104 mmol/L (ref 96–106)
CO2: 15 mmol/L — AB (ref 20–29)
CREATININE: 0.6 mg/dL (ref 0.57–1.00)
Calcium: 9.3 mg/dL (ref 8.7–10.2)
GFR calc Af Amer: 127 mL/min/{1.73_m2} (ref >59)
GFR calc non Af Amer: 110 mL/min/{1.73_m2} (ref >59)
GLUCOSE: 110 mg/dL — AB (ref 65–99)
Potassium: 4.1 mmol/L (ref 3.5–5.2)
Sodium: 140 mmol/L (ref 134–144)

## 2017-05-09 LAB — LIPASE: LIPASE: 9 U/L — AB (ref 14–72)

## 2017-05-15 ENCOUNTER — Encounter (HOSPITAL_COMMUNITY)
Admission: RE | Disposition: A | Discharge status: Home / Self Care | Payer: Self-pay | Source: Ambulatory Visit | Attending: Internal Medicine

## 2017-05-15 ENCOUNTER — Other Ambulatory Visit (INDEPENDENT_AMBULATORY_CARE_PROVIDER_SITE_OTHER): Payer: Self-pay | Admitting: *Deleted

## 2017-05-15 ENCOUNTER — Ambulatory Visit (HOSPITAL_COMMUNITY)
Admission: RE | Admit: 2017-05-15 | Discharge: 2017-05-15 | Disposition: A | Discharge status: Home / Self Care | Payer: Medicare HMO | Source: Ambulatory Visit | Attending: Internal Medicine | Admitting: Internal Medicine

## 2017-05-15 ENCOUNTER — Encounter (HOSPITAL_COMMUNITY): Payer: Self-pay | Admitting: *Deleted

## 2017-05-15 ENCOUNTER — Encounter (INDEPENDENT_AMBULATORY_CARE_PROVIDER_SITE_OTHER): Payer: Self-pay | Admitting: *Deleted

## 2017-05-15 DIAGNOSIS — Z5309 Procedure and treatment not carried out because of other contraindication: Secondary | ICD-10-CM | POA: Insufficient documentation

## 2017-05-15 DIAGNOSIS — R1013 Epigastric pain: Secondary | ICD-10-CM | POA: Diagnosis not present

## 2017-05-15 DIAGNOSIS — G8929 Other chronic pain: Secondary | ICD-10-CM | POA: Insufficient documentation

## 2017-05-15 HISTORY — DX: Whipple's disease: K90.81

## 2017-05-15 LAB — GLUCOSE, CAPILLARY: Glucose-Capillary: 78 mg/dL (ref 65–99)

## 2017-05-15 SURGERY — EGD (ESOPHAGOGASTRODUODENOSCOPY)
Anesthesia: Moderate Sedation

## 2017-05-15 MED ORDER — SODIUM CHLORIDE 0.9% FLUSH
INTRAVENOUS | Status: AC
  Filled 2017-05-15: qty 10

## 2017-05-15 MED ORDER — MEPERIDINE HCL 50 MG/ML IJ SOLN
INTRAMUSCULAR | Status: AC
  Filled 2017-05-15: qty 1

## 2017-05-15 MED ORDER — MIDAZOLAM HCL 5 MG/5ML IJ SOLN
INTRAMUSCULAR | Status: AC
  Filled 2017-05-15: qty 10

## 2017-05-15 MED ORDER — LIDOCAINE VISCOUS 2 % MT SOLN
OROMUCOSAL | Status: AC
  Filled 2017-05-15: qty 15

## 2017-05-15 NOTE — OR Nursing (Signed)
PIV attempts were unsuccessful including 3 attempts using ultrasound that initially ran well but infiltrated shortly after insertion, with swelling present and pain at the sites reported by patient.  Dr. Karilyn Cotaehman was notified and said to cancel procedure for today and have the office reschedule the procedure to be done with Propofol.  [name] [name] was notified in his office to contact patient for reschedule.

## 2017-05-20 ENCOUNTER — Other Ambulatory Visit: Payer: Self-pay | Admitting: Family Medicine

## 2017-05-21 NOTE — Telephone Encounter (Signed)
Ok six mo all 

## 2017-05-21 NOTE — Telephone Encounter (Signed)
Last seen 05/08/2017

## 2017-05-23 ENCOUNTER — Encounter (HOSPITAL_COMMUNITY)
Admit: 2017-05-23 | Discharge: 2017-05-23 | Disposition: A | Discharge status: Home / Self Care | Payer: Medicare HMO | Attending: Internal Medicine | Admitting: Internal Medicine

## 2017-05-23 ENCOUNTER — Encounter (HOSPITAL_COMMUNITY): Payer: Self-pay

## 2017-05-23 DIAGNOSIS — R1013 Epigastric pain: Secondary | ICD-10-CM

## 2017-05-23 NOTE — Patient Instructions (Signed)
[name] [name]  05/23/2017     @PREFPERIOPPHARMACY @   Your procedure is scheduled on  05/28/2017  Report to [facility] at  1100  A.M.  Call this number if you have problems the morning of surgery:  [phone]   Remember:  Do not eat food or drink liquids after midnight.  Take these medicines the morning of surgery with A SIP OF WATER  zofran or phenergan, oxycodone, protonix, topamax, Take 1/2 of your usual insulin the night before your procedure. DO NOT take any medications for diabetes the morning of your procedure.   Do not wear jewelry, make-up or nail polish.  Do not wear lotions, powders, or perfumes, or deoderant.  Do not shave 48 hours prior to surgery.  Men may shave face and neck.  Do not bring valuables to the hospital.  [facility] is not responsible for any belongings or valuables.  Contacts, dentures or bridgework may not be worn into surgery.  Leave your suitcase in the car.  After surgery it may be brought to your room.  For patients admitted to the hospital, discharge time will be determined by your treatment team.  Patients discharged the day of surgery will not be allowed to drive home.   Name and phone number of your driver:  family Special instructions:  Follow the diet instructions given to you by Dr Luvenia Starch office.  Please read over the following fact sheets that you were given. Anesthesia Post-op Instructions and Care and Recovery After Surgery       Esophagogastroduodenoscopy Esophagogastroduodenoscopy (EGD) is a procedure to examine the lining of the esophagus, stomach, and first part of the small intestine (duodenum). This procedure is done to check for problems such as inflammation, bleeding, ulcers, or growths. During this procedure, a long, flexible, lighted tube with a camera attached (endoscope) is inserted down the throat. Tell a health care provider about:  Any allergies you have.  All medicines you are taking, including  vitamins, herbs, eye drops, creams, and over-the-counter medicines.  Any problems you or family members have had with anesthetic medicines.  Any blood disorders you have.  Any surgeries you have had.  Any medical conditions you have.  Whether you are pregnant or may be pregnant. What are the risks? Generally, this is a safe procedure. However, problems may occur, including:  Infection.  Bleeding.  A tear (perforation) in the esophagus, stomach, or duodenum.  Trouble breathing.  Excessive sweating.  Spasms of the larynx.  A slowed heartbeat.  Low blood pressure.  What happens before the procedure?  Follow instructions from your health care provider about eating or drinking restrictions.  Ask your health care provider about: ? Changing or stopping your regular medicines. This is especially important if you are taking diabetes medicines or blood thinners. ? Taking medicines such as aspirin and ibuprofen. These medicines can thin your blood. Do not take these medicines before your procedure if your health care provider instructs you not to.  Plan to have someone take you home after the procedure.  If you wear dentures, be ready to remove them before the procedure. What happens during the procedure?  To reduce your risk of infection, your health care team will wash or sanitize their hands.  An IV tube will be put in a vein in your hand or arm. You will get medicines and fluids through this tube.  You will be given one or more of the  following: ? A medicine to help you relax (sedative). ? A medicine to numb the area (local anesthetic). This medicine may be sprayed into your throat. It will make you feel more comfortable and keep you from gagging or coughing during the procedure. ? A medicine for pain.  A mouth guard may be placed in your mouth to protect your teeth and to keep you from biting on the endoscope.  You will be asked to lie on your left side.  The  endoscope will be lowered down your throat into your esophagus, stomach, and duodenum.  Air will be put into the endoscope. This will help your health care provider see better.  The lining of your esophagus, stomach, and duodenum will be examined.  Your health care provider may: ? Take a tissue sample so it can be looked at in a lab (biopsy). ? Remove growths. ? Remove objects (foreign bodies) that are stuck. ? Treat any bleeding with medicines or other devices that stop tissue from bleeding. ? Widen (dilate) or stretch narrowed areas of your esophagus and stomach.  The endoscope will be taken out. The procedure may vary among health care providers and hospitals. What happens after the procedure?  Your blood pressure, heart rate, breathing rate, and blood oxygen level will be monitored often until the medicines you were given have worn off.  Do not eat or drink anything until the numbing medicine has worn off and your gag reflex has returned. This information is not intended to replace advice given to you by your health care provider. Make sure you discuss any questions you have with your health care provider. Document Released: 01/25/2005 Document Revised: 03/01/2016 Document Reviewed: 08/18/2015 Elsevier Interactive Patient Education  2018 [employer]. Esophagogastroduodenoscopy, Care After Refer to this sheet in the next few weeks. These instructions provide you with information about caring for yourself after your procedure. Your health care provider may also give you more specific instructions. Your treatment has been planned according to current medical practices, but problems sometimes occur. Call your health care provider if you have any problems or questions after your procedure. What can I expect after the procedure? After the procedure, it is common to have:  A sore throat.  Nausea.  Bloating.  Dizziness.  Fatigue.  Follow these instructions at home:  Do not eat  or drink anything until the numbing medicine (local anesthetic) has worn off and your gag reflex has returned. You will know that the local anesthetic has worn off when you can swallow comfortably.  Do not drive for 24 hours if you received a medicine to help you relax (sedative).  If your health care provider took a tissue sample for testing during the procedure, make sure to get your test results. This is your responsibility. Ask your health care provider or the department performing the test when your results will be ready.  Keep all follow-up visits as told by your health care provider. This is important. Contact a health care provider if:  You cannot stop coughing.  You are not urinating.  You are urinating less than usual. Get help right away if:  You have trouble swallowing.  You cannot eat or drink.  You have throat or chest pain that gets worse.  You are dizzy or light-headed.  You faint.  You have nausea or vomiting.  You have chills.  You have a fever.  You have severe abdominal pain.  You have black, tarry, or bloody stools. This information is not  intended to replace advice given to you by your health care provider. Make sure you discuss any questions you have with your health care provider. Document Released: 09/10/2012 Document Revised: 03/01/2016 Document Reviewed: 08/18/2015 Elsevier Interactive Patient Education  2018 [employer]  Monitored Anesthesia Care Anesthesia is a term that refers to techniques, procedures, and medicines that help a person stay safe and comfortable during a medical procedure. Monitored anesthesia care, or sedation, is one type of anesthesia. Your anesthesia specialist may recommend sedation if you will be having a procedure that does not require you to be unconscious, such as:  Cataract surgery.  A dental procedure.  A biopsy.  A colonoscopy.  During the procedure, you may receive a medicine to help you relax (sedative).  There are three levels of sedation:  Mild sedation. At this level, you may feel awake and relaxed. You will be able to follow directions.  Moderate sedation. At this level, you will be sleepy. You may not remember the procedure.  Deep sedation. At this level, you will be asleep. You will not remember the procedure.  The more medicine you are given, the deeper your level of sedation will be. Depending on how you respond to the procedure, the anesthesia specialist may change your level of sedation or the type of anesthesia to fit your needs. An anesthesia specialist will monitor you closely during the procedure. Let your health care provider know about:  Any allergies you have.  All medicines you are taking, including vitamins, herbs, eye drops, creams, and over-the-counter medicines.  Any use of steroids (by mouth or as a cream).  Any problems you or family members have had with sedatives and anesthetic medicines.  Any blood disorders you have.  Any surgeries you have had.  Any medical conditions you have, such as sleep apnea.  Whether you are pregnant or may be pregnant.  Any use of cigarettes, alcohol, or street drugs. What are the risks? Generally, this is a safe procedure. However, problems may occur, including:  Getting too much medicine (oversedation).  Nausea.  Allergic reaction to medicines.  Trouble breathing. If this happens, a breathing tube may be used to help with breathing. It will be removed when you are awake and breathing on your own.  Heart trouble.  Lung trouble.  Before the procedure Staying hydrated Follow instructions from your health care provider about hydration, which may include:  Up to 2 hours before the procedure - you may continue to drink clear liquids, such as water, clear fruit juice, black coffee, and plain tea.  Eating and drinking restrictions Follow instructions from your health care provider about eating and drinking, which may  include:  8 hours before the procedure - stop eating heavy meals or foods such as meat, fried foods, or fatty foods.  6 hours before the procedure - stop eating light meals or foods, such as toast or cereal.  6 hours before the procedure - stop drinking milk or drinks that contain milk.  2 hours before the procedure - stop drinking clear liquids.  Medicines Ask your health care provider about:  Changing or stopping your regular medicines. This is especially important if you are taking diabetes medicines or blood thinners.  Taking medicines such as aspirin and ibuprofen. These medicines can thin your blood. Do not take these medicines before your procedure if your health care provider instructs you not to.  Tests and exams  You will have a physical exam.  You may have blood tests done  to show: ? How well your kidneys and liver are working. ? How well your blood can clot.  General instructions  Plan to have someone take you home from the hospital or clinic.  If you will be going home right after the procedure, plan to have someone with you for 24 hours.  What happens during the procedure?  Your blood pressure, heart rate, breathing, level of pain and overall condition will be monitored.  An IV tube will be inserted into one of your veins.  Your anesthesia specialist will give you medicines as needed to keep you comfortable during the procedure. This may mean changing the level of sedation.  The procedure will be performed. After the procedure  Your blood pressure, heart rate, breathing rate, and blood oxygen level will be monitored until the medicines you were given have worn off.  Do not drive for 24 hours if you received a sedative.  You may: ? Feel sleepy, clumsy, or nauseous. ? Feel forgetful about what happened after the procedure. ? Have a sore throat if you had a breathing tube during the procedure. ? Vomit. This information is not intended to replace advice  given to you by your health care provider. Make sure you discuss any questions you have with your health care provider. Document Released: 06/20/2005 Document Revised: 03/02/2016 Document Reviewed: 01/15/2016 Elsevier Interactive Patient Education  2018 [employer] Monitored Anesthesia Care, Care After These instructions provide you with information about caring for yourself after your procedure. Your health care provider may also give you more specific instructions. Your treatment has been planned according to current medical practices, but problems sometimes occur. Call your health care provider if you have any problems or questions after your procedure. What can I expect after the procedure? After your procedure, it is common to:  Feel sleepy for several hours.  Feel clumsy and have poor balance for several hours.  Feel forgetful about what happened after the procedure.  Have poor judgment for several hours.  Feel nauseous or vomit.  Have a sore throat if you had a breathing tube during the procedure.  Follow these instructions at home: For at least 24 hours after the procedure:   Do not: ? Participate in activities in which you could fall or become injured. ? Drive. ? Use heavy machinery. ? Drink alcohol. ? Take sleeping pills or medicines that cause drowsiness. ? Make important decisions or sign legal documents. ? Take care of children on your own.  Rest. Eating and drinking  Follow the diet that is recommended by your health care provider.  If you vomit, drink water, juice, or soup when you can drink without vomiting.  Make sure you have little or no nausea before eating solid foods. General instructions  Have a responsible adult stay with you until you are awake and alert.  Take over-the-counter and prescription medicines only as told by your health care provider.  If you smoke, do not smoke without supervision.  Keep all follow-up visits as told by your  health care provider. This is important. Contact a health care provider if:  You keep feeling nauseous or you keep vomiting.  You feel light-headed.  You develop a rash.  You have a fever. Get help right away if:  You have trouble breathing. This information is not intended to replace advice given to you by your health care provider. Make sure you discuss any questions you have with your health care provider. Document Released: 01/15/2016 Document Revised: 05/16/2016  Document Reviewed: 01/15/2016 Elsevier Interactive Patient Education  [employer].

## 2017-05-24 ENCOUNTER — Telehealth: Payer: Self-pay | Admitting: Family Medicine

## 2017-05-24 ENCOUNTER — Other Ambulatory Visit: Payer: Self-pay | Admitting: *Deleted

## 2017-05-24 MED ORDER — OXYCODONE-ACETAMINOPHEN 5-325 MG PO TABS: 1.0000 | ORAL_TABLET | Freq: Three times a day (TID) | ORAL | 0 refills | Status: DC | PRN

## 2017-05-24 NOTE — Telephone Encounter (Signed)
Patient may have a prescription for 15 tablets, one 3 times a day when necessary severe pain not for long-term use caution drowsiness home use only

## 2017-05-24 NOTE — Telephone Encounter (Signed)
Patient was given #28 oxycodone for epigastric pain on 05/08/17

## 2017-05-24 NOTE — Telephone Encounter (Signed)
Patient wants to know if Dr. Lorin Picket can write her for a couple of pain pills to last her until Tuesday.  She said she has a test on Tuesday.

## 2017-05-24 NOTE — Telephone Encounter (Signed)
Script ready for pickup. Pt notified on voicemail.  

## 2017-05-28 ENCOUNTER — Ambulatory Visit (HOSPITAL_COMMUNITY): Payer: Medicare HMO | Admitting: Anesthesiology

## 2017-05-28 ENCOUNTER — Encounter (HOSPITAL_COMMUNITY): Payer: Self-pay | Admitting: *Deleted

## 2017-05-28 ENCOUNTER — Encounter (HOSPITAL_COMMUNITY)
Admission: RE | Disposition: A | Discharge status: Home / Self Care | Payer: Self-pay | Source: Ambulatory Visit | Attending: Internal Medicine

## 2017-05-28 ENCOUNTER — Ambulatory Visit (HOSPITAL_COMMUNITY)
Admission: RE | Admit: 2017-05-28 | Discharge: 2017-05-28 | Disposition: A | Discharge status: Home / Self Care | Payer: Medicare HMO | Source: Ambulatory Visit | Attending: Internal Medicine | Admitting: Internal Medicine

## 2017-05-28 DIAGNOSIS — K2950 Unspecified chronic gastritis without bleeding: Secondary | ICD-10-CM | POA: Insufficient documentation

## 2017-05-28 DIAGNOSIS — K9081 Whipple's disease: Secondary | ICD-10-CM | POA: Diagnosis not present

## 2017-05-28 DIAGNOSIS — K219 Gastro-esophageal reflux disease without esophagitis: Secondary | ICD-10-CM | POA: Insufficient documentation

## 2017-05-28 DIAGNOSIS — K589 Irritable bowel syndrome without diarrhea: Secondary | ICD-10-CM | POA: Insufficient documentation

## 2017-05-28 DIAGNOSIS — F319 Bipolar disorder, unspecified: Secondary | ICD-10-CM | POA: Insufficient documentation

## 2017-05-28 DIAGNOSIS — K861 Other chronic pancreatitis: Secondary | ICD-10-CM | POA: Diagnosis not present

## 2017-05-28 DIAGNOSIS — Z833 Family history of diabetes mellitus: Secondary | ICD-10-CM | POA: Diagnosis not present

## 2017-05-28 DIAGNOSIS — Z915 Personal history of self-harm: Secondary | ICD-10-CM | POA: Insufficient documentation

## 2017-05-28 DIAGNOSIS — Z794 Long term (current) use of insulin: Secondary | ICD-10-CM | POA: Insufficient documentation

## 2017-05-28 DIAGNOSIS — R1013 Epigastric pain: Secondary | ICD-10-CM | POA: Insufficient documentation

## 2017-05-28 DIAGNOSIS — Z980 Intestinal bypass and anastomosis status: Secondary | ICD-10-CM | POA: Diagnosis not present

## 2017-05-28 DIAGNOSIS — Z79899 Other long term (current) drug therapy: Secondary | ICD-10-CM | POA: Insufficient documentation

## 2017-05-28 DIAGNOSIS — Z87442 Personal history of urinary calculi: Secondary | ICD-10-CM | POA: Insufficient documentation

## 2017-05-28 DIAGNOSIS — K228 Other specified diseases of esophagus: Secondary | ICD-10-CM | POA: Insufficient documentation

## 2017-05-28 DIAGNOSIS — G8929 Other chronic pain: Secondary | ICD-10-CM | POA: Diagnosis not present

## 2017-05-28 DIAGNOSIS — G43909 Migraine, unspecified, not intractable, without status migrainosus: Secondary | ICD-10-CM | POA: Diagnosis not present

## 2017-05-28 DIAGNOSIS — F419 Anxiety disorder, unspecified: Secondary | ICD-10-CM | POA: Insufficient documentation

## 2017-05-28 DIAGNOSIS — Z888 Allergy status to other drugs, medicaments and biological substances status: Secondary | ICD-10-CM | POA: Diagnosis not present

## 2017-05-28 DIAGNOSIS — E119 Type 2 diabetes mellitus without complications: Secondary | ICD-10-CM | POA: Diagnosis not present

## 2017-05-28 DIAGNOSIS — Z90411 Acquired partial absence of pancreas: Secondary | ICD-10-CM | POA: Insufficient documentation

## 2017-05-28 HISTORY — PX: ESOPHAGOGASTRODUODENOSCOPY (EGD) WITH PROPOFOL: SHX5813

## 2017-05-28 LAB — HEPATIC FUNCTION PANEL
ALBUMIN: 2.5 g/dL — AB (ref 3.5–5.0)
ALT: 26 U/L (ref 14–54)
AST: 32 U/L (ref 15–41)
Alkaline Phosphatase: 69 U/L (ref 38–126)
Bilirubin, Direct: 0.2 mg/dL (ref 0.1–0.5)
Indirect Bilirubin: 0.4 mg/dL (ref 0.3–0.9)
TOTAL PROTEIN: 5.2 g/dL — AB (ref 6.5–8.1)
Total Bilirubin: 0.6 mg/dL (ref 0.3–1.2)

## 2017-05-28 LAB — GLUCOSE, CAPILLARY
GLUCOSE-CAPILLARY: 73 mg/dL (ref 65–99)
Glucose-Capillary: 76 mg/dL (ref 65–99)

## 2017-05-28 SURGERY — ESOPHAGOGASTRODUODENOSCOPY (EGD) WITH PROPOFOL
Anesthesia: Monitor Anesthesia Care

## 2017-05-28 MED ORDER — PROPOFOL 10 MG/ML IV BOLUS
INTRAVENOUS | Status: DC | PRN
  Administered 2017-05-28 (×2): 20 mg via INTRAVENOUS

## 2017-05-28 MED ORDER — FENTANYL CITRATE (PF) 100 MCG/2ML IJ SOLN
INTRAMUSCULAR | Status: DC | PRN
  Administered 2017-05-28 (×5): 25 ug via INTRAVENOUS

## 2017-05-28 MED ORDER — LIDOCAINE VISCOUS 2 % MT SOLN
OROMUCOSAL | Status: AC
  Filled 2017-05-28: qty 15

## 2017-05-28 MED ORDER — MIDAZOLAM HCL 2 MG/2ML IJ SOLN
INTRAMUSCULAR | Status: AC
  Filled 2017-05-28: qty 2

## 2017-05-28 MED ORDER — PROPOFOL 10 MG/ML IV BOLUS
INTRAVENOUS | Status: AC
  Filled 2017-05-28: qty 40

## 2017-05-28 MED ORDER — FENTANYL CITRATE (PF) 100 MCG/2ML IJ SOLN
INTRAMUSCULAR | Status: AC
  Filled 2017-05-28: qty 2

## 2017-05-28 MED ORDER — SUCRALFATE 1 G PO TABS: 1.0000 g | ORAL_TABLET | Freq: Three times a day (TID) | ORAL | 1 refills | Status: DC

## 2017-05-28 MED ORDER — MIDAZOLAM HCL 2 MG/2ML IJ SOLN
1.0000 mg | Freq: Once | INTRAMUSCULAR | Status: AC | PRN
  Administered 2017-05-28: 2 mg via INTRAVENOUS

## 2017-05-28 MED ORDER — KETAMINE HCL 10 MG/ML IJ SOLN
INTRAMUSCULAR | Status: AC
  Filled 2017-05-28: qty 1

## 2017-05-28 MED ORDER — PROPOFOL 500 MG/50ML IV EMUL
INTRAVENOUS | Status: DC | PRN
  Administered 2017-05-28 (×3): 150 ug/kg/min via INTRAVENOUS

## 2017-05-28 MED ORDER — LACTATED RINGERS IV SOLN
INTRAVENOUS | Status: DC
  Administered 2017-05-28: 12:00:00 via INTRAVENOUS

## 2017-05-28 MED ORDER — PROPOFOL 10 MG/ML IV BOLUS
INTRAVENOUS | Status: AC
  Filled 2017-05-28: qty 20

## 2017-05-28 MED ORDER — CHLORHEXIDINE GLUCONATE CLOTH 2 % EX PADS: 6.0000 | MEDICATED_PAD | Freq: Once | CUTANEOUS | Status: DC

## 2017-05-28 MED ORDER — LIDOCAINE-PRILOCAINE 2.5-2.5 % EX CREA
TOPICAL_CREAM | CUTANEOUS | Status: DC | PRN
  Administered 2017-05-28 (×2): 1 via TOPICAL

## 2017-05-28 MED ORDER — MIDAZOLAM HCL 5 MG/5ML IJ SOLN
INTRAMUSCULAR | Status: DC | PRN
  Administered 2017-05-28: 2 mg via INTRAVENOUS

## 2017-05-28 MED ORDER — LIDOCAINE-PRILOCAINE 2.5-2.5 % EX CREA
TOPICAL_CREAM | Freq: Once | CUTANEOUS | Status: DC
  Filled 2017-05-28: qty 5

## 2017-05-28 MED ORDER — LIDOCAINE HCL (PF) 1 % IJ SOLN
INTRAMUSCULAR | Status: AC
Start: 2017-05-28 — End: ?
  Filled 2017-05-28: qty 5

## 2017-05-28 MED ORDER — LIDOCAINE VISCOUS 2 % MT SOLN
15.0000 mL | Freq: Once | OROMUCOSAL | Status: AC
  Administered 2017-05-28: 5 mL via OROMUCOSAL

## 2017-05-28 NOTE — Anesthesia Preprocedure Evaluation (Addendum)
Anesthesia Evaluation  Patient identified by MRN, date of birth, ID band Patient awake    Airway Mallampati: I  TM Distance: >3 FB Neck ROM: Full    Dental  (+) Poor Dentition, Chipped, Missing   Pulmonary    Pulmonary exam normal        Cardiovascular Exercise Tolerance: Good Normal cardiovascular exam Rhythm:Regular Rate:Normal     Neuro/Psych  Headaches, PSYCHIATRIC DISORDERS Anxiety Depression Bipolar Disorder    GI/Hepatic GERD  Medicated,Mild liver dysfunction Bilirubin         Component                Value               Date/Time                 BILITOT                  0.5                 05/08/2017 1504           BILIDIR                  0.19                05/08/2017 1504           IBILI                    0.3                 10/31/2016 1139       Hepatic Function Panel         Component                Value               Date/Time                 PROT                     7.6                 05/08/2017 1504           ALBUMIN                  4.3                 05/08/2017 1504           AST                      48 (H)              05/08/2017 1504           ALT                      51 (H)              05/08/2017 1504           ALKPHOS                  136 (H)             05/08/2017 1504           BILITOT                  0.5  05/08/2017 1504           BILIDIR                  0.19                05/08/2017 1504           IBILI                    0.3                 10/31/2016 1139        Endo/Other  diabetes, Well Controlled, Type 1, Insulin DependentHx pancreatitis, hx whipple.    Renal/GU      Musculoskeletal   Abdominal Normal abdominal exam  (+)   Peds  Hematology   Anesthesia Other Findings   Reproductive/Obstetrics                          Anesthesia Physical Anesthesia Plan  ASA: IV  Anesthesia Plan: MAC   Post-op Pain Management:    Induction:  Intravenous  PONV Risk Score and Plan:   Airway Management Planned: Mask and Natural Airway  Additional Equipment:   Intra-op Plan:   Post-operative Plan:   Informed Consent: I have reviewed the patients History and Physical, chart, labs and discussed the procedure including the risks, benefits and alternatives for the proposed anesthesia with the patient or authorized representative who has indicated his/her understanding and acceptance.   Dental advisory given  Plan Discussed with: CRNA  Anesthesia Plan Comments:         Anesthesia Quick Evaluation

## 2017-05-28 NOTE — Anesthesia Postprocedure Evaluation (Signed)
Anesthesia Post Note  Patient: [name] [name]  Procedure(s) Performed: Procedure(s) (LRB): ESOPHAGOGASTRODUODENOSCOPY (EGD) WITH PROPOFOL (N/A)  Patient location during evaluation: PACU Anesthesia Type: MAC Level of consciousness: awake and alert and patient cooperative Pain management: satisfactory to patient Vital Signs Assessment: post-procedure vital signs reviewed and stable Respiratory status: spontaneous breathing Cardiovascular status: stable Postop Assessment: no signs of nausea or vomiting Anesthetic complications: no     Last Vitals:  Vitals:   05/28/17 1435 05/28/17 1445  BP: (!) 141/79 125/71  Pulse: 94 91  Resp: 18 15  Temp: 36.9 C   SpO2: 100% 100%    Last Pain:  Vitals:   05/28/17 1114  PainSc: 6                  Nesiah Jump

## 2017-05-28 NOTE — Anesthesia Procedure Notes (Signed)
Procedure Name: MAC Date/Time: 05/28/2017 1:15 PM Performed by: Andree Elk, AMY A Pre-anesthesia Checklist: Patient identified, Emergency Drugs available, Suction available, Patient being monitored and Timeout performed Oxygen Delivery Method: Simple face mask

## 2017-05-28 NOTE — Transfer of Care (Signed)
Immediate Anesthesia Transfer of Care Note  Patient: [name] [name]  Procedure(s) Performed: Procedure(s) with comments: ESOPHAGOGASTRODUODENOSCOPY (EGD) WITH PROPOFOL (N/A) - 2:00  Patient Location: PACU  Anesthesia Type:MAC  Level of Consciousness: awake, alert  and patient cooperative  Airway & Oxygen Therapy: Patient Spontanous Breathing and non-rebreather face mask  Post-op Assessment: Report given to RN and Post -op Vital signs reviewed and stable  Post vital signs: Reviewed and stable  Last Vitals:  Vitals:   05/28/17 1310 05/28/17 1435  BP:  (!) 141/79  Pulse:  94  Resp: (!) 34 18  Temp:  (P) 36.9 C  SpO2: 97% 100%    Last Pain:  Vitals:   05/28/17 1114  PainSc: 6       Patients Stated Pain Goal: 6 (16/38/46 [id number])  Complications: No apparent anesthesia complications

## 2017-05-28 NOTE — OR Nursing (Signed)
Patient hard difficult stick,  Used ultrasound to establish iv

## 2017-05-28 NOTE — Op Note (Signed)
[facility] Patient Name: [name] [name] Procedure Date: 05/28/2017 12:13 PM MRN: [id number] Date of Birth: 16-Apr-1971 Attending MD: Lionel December , MD CSN: [id number] Age: 46 Admit Type: Outpatient Procedure:                Upper GI endoscopy Indications:              Epigastric abdominal pain Providers:                Lionel December, MD, Nena Polio, RN, Jannett Celestine, RN Referring MD:             Jonna Coup. Luking, MD Medicines:                Lidocaine spray, Propofol per Anesthesia Complications:            No immediate complications. Estimated Blood Loss:     Estimated blood loss: none. Procedure:                Pre-Anesthesia Assessment:                           - Prior to the procedure, a History and Physical                            was performed, and patient medications and                            allergies were reviewed. The patient's tolerance of                            previous anesthesia was also reviewed. The risks                            and benefits of the procedure and the sedation                            options and risks were discussed with the patient.                            All questions were answered, and informed consent                            was obtained. Prior Anticoagulants: The patient has                            taken no previous anticoagulant or antiplatelet                            agents. ASA Grade Assessment: III - A patient with                            severe systemic disease. After reviewing the risks                            and benefits, the patient was deemed in  satisfactory condition to undergo the procedure.                           After obtaining informed consent, the endoscope was                            passed under direct vision. Throughout the                            procedure, the patient's blood pressure, pulse, and                            oxygen saturations were  monitored continuously. The                            [id number] ([id number]) scope was introduced through the                            mouth, and advanced to the proximal jejunum. The                            patient tolerated the procedure well. Scope In: 2:18:23 PM Scope Out: 2:25:10 PM Total Procedure Duration: 0 hours 6 minutes 47 seconds  Findings:      The examined esophagus was normal.      The Z-line was irregular and was found 34 cm from the incisors.      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 This       was characterized by healthy appearing mucosa.      A small amount of food (residue) was found in the gastric fundus.      The gastric body was normal.      The examined jejunum was normal. Impression:               - Normal esophagus.                           - Z-line irregular, 34 cm from the incisors.                           - A gastrojejunostomy was found, characterized by                            healthy appearing mucosa.                           - A small amount of food (residue) in the stomach.                           - Normal gastric body.                           - Normal examined jejunum.                           - No specimens collected.  Comment; Pocedure time was 68 minutes because of                            loss of IV access. Moderate Sedation:      Per Anesthesia Care Recommendation:           - Patient has a contact number available for                            emergencies. The signs and symptoms of potential                            delayed complications were discussed with the                            patient. Return to normal activities tomorrow.                            Written discharge instructions were provided to the                            patient.                           - Resume previous diet today.                           - Continue present medications.                            - No aspirin, ibuprofen, naproxen, or other                            non-steroidal anti-inflammatory drugs.                           - Sucralfate 1 g by mouthbefore each meal and at                            bedtime.                           - LFTsbe checked today Procedure Code(s):        --- Professional ---                           [phone], Esophagogastroduodenoscopy, flexible,                            transoral; diagnostic, including collection of                            specimen(s) by brushing or washing, when performed                            (separate procedure) Diagnosis Code(s):        --- Professional ---  K22.8, Other specified diseases of esophagus                           Z98.0, Intestinal bypass and anastomosis status                           R10.13, Epigastric pain CPT copyright 2016 American [employer] Association. All rights reserved. The codes documented in this report are preliminary and upon coder review may  be revised to meet current compliance requirements. Lionel December, MD Lionel December, MD 05/28/2017 2:42:07 PM This report has been signed electronically. Number of Addenda: 0

## 2017-05-28 NOTE — H&P (Signed)
[name] [name] is an 46 y.o. female.   Chief Complaint: Patient is here for EGD. HPI: Patient is 46 year old Caucasian female with multiple medical problems including history of Whipple procedure for chronic pancreatitis who presents with few week history of epigastric pain. She underwent abdominopelvic CT by Dr. Lilyan Punt and it did not reveal any abnormality to account for pain. She also had LFTs which revealed mild abnormality transaminases and Bartholin alkaline phosphatase. She has had post prandial nausea without vomiting. She has lost but 6-8 pounds the last few weeks. She denies melena or rectal bleeding. She does not take OTC NSAIDs. She is here for diagnostic EGD.  Past Medical History:  Diagnosis Date  . Acute bacterial bronchitis    dx 12-06-2014  --  FINISHED Z-PAC-  NO FEVER BUT STILL INTERMITTANT  PRODUCTIVE COUGH  . Anxiety   . Chronic pain    has pain specialist  . GERD (gastroesophageal reflux disease)   . History of chronic gastritis   . History of chronic pancreatitis    multple recurrent--  s/p  whipple 2011  . History of esophageal ulcer   . History of kidney stones   . History of subarachnoid hemorrhage    01-08-2013--  fell on concrete--  resolved without surgical intervention  . History of suicide attempt    hx multiple attempts by overdosing of meds  . IBS (irritable bowel syndrome)   . Left ureteral calculus   . Major depression, recurrent, chronic (HCC)    w/  Hx  multiple suicide attempts (overdose)  . Migraine headache   . Type 2 diabetes mellitus, uncontrolled (HCC)   . Whipple disease 2011    Past Surgical History:  Procedure Laterality Date  . CESAREAN SECTION  x2  last one 02-29-2000   last one w/ BILATERAL TUBAL LIGATION  . COLONOSCOPY  03-14-2009   dx ileocolonscopy  . CYSTOSCOPY WITH RETROGRADE PYELOGRAM, URETEROSCOPY AND STENT PLACEMENT Left 12/20/2014   Procedure: CYSTOSCOPY, LEFT URETEROSCOPY, STONE REMOVAL, LASER LITHOTRIPSY;  Surgeon:  Barron Alvine, MD;  Location: [facility];  Service: Urology;  Laterality: Left;  . ESOPHAGOGASTRODUODENOSCOPY  last  one 12-31-2008  . HOLMIUM LASER APPLICATION Left 12/20/2014   Procedure: HOLMIUM LASER APPLICATION;  Surgeon: Barron Alvine, MD;  Location[facility];  Service: Urology;  Laterality: Left;  . LAPAROSCOPIC ASSISTED VAGINAL HYSTERECTOMY  12-16-2006  . LAPAROSCOPIC NISSEN FUNDOPLICATION  Nov 2001   w/  CHOLECYSTECTOMY  . REPAIR PERFORATED VISCUS  POST WHIPPLE  x5    last one 06-23-2012  . TUBAL LIGATION    . WHIPPLE PROCEDURE  2011   at [facility] History  Problem Relation Age of Onset  . Diabetes Father    Social History:  reports that she has never smoked. She has never used smokeless tobacco. She reports that she does not drink alcohol or use drugs.  Allergies:  Allergies  Allergen Reactions  . Buspar [Buspirone] Other (See Comments)    Dizziness  . Metoclopramide Hcl Other (See Comments)    "lock-jaw"    Facility-Administered Medications Prior to Admission  Medication Dose Route Frequency Provider Last Rate Last Dose  . lipase/protease/amylase (CREON) capsule 12,000 Units  12,000 Units Oral TID AC Setzer, Terri L, NP       Medications Prior to Admission  Medication Sig Dispense Refill  . insulin aspart (NOVOLOG) 100 UNIT/ML injection Inject 10-30 Units into the skin 3 (three) times daily before meals.    Marland Kitchen  Insulin Glargine (LANTUS SOLOSTAR) 100 UNIT/ML Solostar Pen Inject 40 Units into the skin at bedtime.     . naproxen (NAPROSYN) 500 MG tablet TAKE 1 TABLET BY MOUTH TWICE DAILY WITH A MEAL AS NEEDED FOR HEADACHE. 30 tablet 0  . nortriptyline (PAMELOR) 25 MG capsule TAKE 4 CAPSULES BY MOUTH AT BEDTIME. 120 capsule 5  . ondansetron (ZOFRAN ODT) 4 MG disintegrating tablet Take 1 tablet (4 mg total) by mouth every 8 (eight) hours as needed for nausea or vomiting. 20 tablet 0  . oxyCODONE-acetaminophen (PERCOCET/ROXICET) 5-325 MG  tablet Take 1 tablet by mouth every 8 (eight) hours as needed for severe pain. Caution drowiness. Home use only 15 tablet 0  . pantoprazole (PROTONIX) 40 MG tablet TAKE ONE TABLET BY MOUTH ONCE DAILY. 30 tablet 4  . promethazine (PHENERGAN) 50 MG tablet TAKE 1 TABLET BY MOUTH EVERY 6 HOURS AS NEEDED FOR NAUSEA OR VOMITING. 30 tablet 5  . topiramate (TOPAMAX) 25 MG tablet TAKE 1 TABLET EVERY MORNING AND 2 TABLETS AT BEDTIME. 90 tablet 5  . UNIFINE PENTIPS 31G X 6 MM MISC     . zolpidem (AMBIEN) 10 MG tablet TAKE ONE TABLET BY MOUTH AT BEDTIME AS NEEDED FOR SLEEP. 30 tablet 5    Results for orders placed or performed during the hospital encounter of 05/28/17 (from the past 48 hour(s))  Glucose, capillary     Status: None   Collection Time: 05/28/17 11:13 AM  Result Value Ref Range   Glucose-Capillary 76 65 - 99 mg/dL   No results found.  ROS  Blood pressure 121/84, temperature 98.6 F (37 C), resp. rate (!) 21, SpO2 98 %. Physical Exam  Constitutional: She appears well-developed and well-nourished.  HENT:  Mouth/Throat: Oropharynx is clear and moist.  Eyes: Conjunctivae are normal. No scleral icterus.  Neck: No thyromegaly present.  Cardiovascular: Normal rate, regular rhythm and normal heart sounds.   No murmur heard. Respiratory: Effort normal and breath sounds normal.  GI:  Long midline scar along with small scar in left lower quadrant laterally. Abdomen is soft with moderate midepigastric tenderness. Mild tenderness also noted below costal margins. No organomegaly or masses.  Musculoskeletal: She exhibits no edema.  Lymphadenopathy:    She has no cervical adenopathy.  Neurological: She is alert.  Skin: Skin is warm and dry.     Assessment/Plan Epigastric pain and nausea patient with complicated GI history. Diagnostic esophagogastroduodenoscopy under monitored anesthesia care.  Lionel December, MD 05/28/2017, 1:10 PM

## 2017-05-28 NOTE — Discharge Instructions (Signed)
Do not take Naprosyn or other NSAIDs. Resume usual medications and diet. Sucralfate 1 g by mouth 30-60 minutes before each meal and at bedtime. No driving for 24 hours. Physician will call with results of blood tests.  Esophagogastroduodenoscopy, Care After Refer to this sheet in the next few weeks. These instructions provide you with information about caring for yourself after your procedure. Your health care provider may also give you more specific instructions. Your treatment has been planned according to current medical practices, but problems sometimes occur. Call your health care provider if you have any problems or questions after your procedure. What can I expect after the procedure? After the procedure, it is common to have:  A sore throat.  Nausea.  Bloating.  Dizziness.  Fatigue.  Follow these instructions at home:  Do not eat or drink anything until the numbing medicine (local anesthetic) has worn off and your gag reflex has returned. You will know that the local anesthetic has worn off when you can swallow comfortably.  Do not drive for 24 hours if you received a medicine to help you relax (sedative).  If your health care provider took a tissue sample for testing during the procedure, make sure to get your test results. This is your responsibility. Ask your health care provider or the department performing the test when your results will be ready.  Keep all follow-up visits as told by your health care provider. This is important. Contact a health care provider if:  You cannot stop coughing.  You are not urinating.  You are urinating less than usual. Get help right away if:  You have trouble swallowing.  You cannot eat or drink.  You have throat or chest pain that gets worse.  You are dizzy or light-headed.  You faint.  You have nausea or vomiting.  You have chills.  You have a fever.  You have severe abdominal pain.  You have black, tarry, or  bloody stools. This information is not intended to replace advice given to you by your health care provider. Make sure you discuss any questions you have with your health care provider. Document Released: 09/10/2012 Document Revised: 03/01/2016 Document Reviewed: 08/18/2015 Elsevier Interactive Patient Education  [employer].

## 2017-05-29 ENCOUNTER — Encounter (HOSPITAL_COMMUNITY): Payer: Self-pay | Admitting: Internal Medicine

## 2017-05-30 ENCOUNTER — Telehealth (INDEPENDENT_AMBULATORY_CARE_PROVIDER_SITE_OTHER): Payer: Self-pay | Admitting: Internal Medicine

## 2017-05-30 NOTE — Telephone Encounter (Signed)
Patient stated that she had a test done Tuesday and has continued to have abdominal pain and it hasn't eased up.  She would like to know what she should do.  [phone]

## 2017-05-30 NOTE — Telephone Encounter (Signed)
Talked with Dr.Rehman ,he is going to call the patient.

## 2017-05-30 NOTE — Telephone Encounter (Signed)
-   Normal esophagus. - Z-line irregular, 34 cm from the incisors. - A gastrojejunostomy was found, characterized by healthy appearing mucosa. - A small amount of food (residue) in the stomach. - Normal gastric body. - Normal examined jejunum. - No specimens collected.   Hepatic Profile - total Protein -5.2 , Albumin 2.5 .  To discuss with Dr.Rehman.

## 2017-06-02 NOTE — Telephone Encounter (Signed)
Results were given to patient on 05/31/2017. Will discuss with Dr. Lorin Picket looking with this she should be referred to pain center but we should do further workup.

## 2017-06-05 ENCOUNTER — Other Ambulatory Visit (INDEPENDENT_AMBULATORY_CARE_PROVIDER_SITE_OTHER): Payer: Self-pay | Admitting: *Deleted

## 2017-06-05 ENCOUNTER — Encounter (INDEPENDENT_AMBULATORY_CARE_PROVIDER_SITE_OTHER): Payer: Self-pay | Admitting: *Deleted

## 2017-06-05 DIAGNOSIS — G8929 Other chronic pain: Secondary | ICD-10-CM

## 2017-06-05 DIAGNOSIS — R1013 Epigastric pain: Principal | ICD-10-CM

## 2017-06-07 ENCOUNTER — Ambulatory Visit: Payer: Medicare HMO | Admitting: Family Medicine

## 2017-06-12 ENCOUNTER — Ambulatory Visit (INDEPENDENT_AMBULATORY_CARE_PROVIDER_SITE_OTHER): Payer: Medicare HMO | Admitting: Family Medicine

## 2017-06-12 ENCOUNTER — Encounter: Payer: Self-pay | Admitting: Family Medicine

## 2017-06-12 VITALS — BP 122/88 | Ht 63.0 in | Wt 142.1 lb

## 2017-06-12 DIAGNOSIS — K861 Other chronic pancreatitis: Secondary | ICD-10-CM

## 2017-06-12 NOTE — Progress Notes (Signed)
   Subjective:    Patient ID: [name] [name], female    DOB: 12/10/1970, 46 y.o.   MRN: [id number] Due to numerous hospitalizations for pancreatitis patient at this time has become virtually impossible for venous access HPI Patient is here today to discuss having a port placed,as she is tired of being stuck so many times when having blood drawn. She says she has diabetes and some other issues that will require her to have her blood drawn several times per year.  Pt has sig major trouble s getting venous access. Diabetic Dr. having difficulty drawing appropriate labs for patient's situation.  Patient does have some history of drug overdose in the past but claims no depression or suicidal thoughts at all now. Also claims no drug abuse difficulties at this time.  Has had two failed attempts  At [facility] but unable to do Because unable to gain venous access.  Patient recently was in the hospital. Patient's gastroenterologist felt she needs to have venous access.                    Dr  [phone]     Review of Systems No headache, no major weight loss or weight gain, no chest pain no back pain abdominal pain no change in bowel habits complete ROS otherwise negative     Objective:   Physical Exam Alert and oriented, vitals reviewed and stable, NAD ENT-TM's and ext canals WNL bilat via otoscopic exam Soft palate, tonsils and post pharynx WNL via oropharyngeal exam Neck-symmetric, no masses; thyroid nonpalpable and nontender Pulmonary-no tachypnea or accessory muscle use; Clear without wheezes via auscultation Card--no abnrml murmurs, rhythm reg and rate WNL Carotid pulses symmetric, without bruits        Assessment & Plan:  Impression venous access. Extremely difficult stick. This is starting to affect her care. At this time patient feels very capable of managing this. Requests referral for Port-A-Cath which I think is reasonable. Long discussion of pros and cons  held  Greater than 50% of this 25 minute face to face visit was spent in counseling and discussion and coordination of care regarding the above diagnosis/diagnosies

## 2017-06-14 ENCOUNTER — Other Ambulatory Visit (HOSPITAL_COMMUNITY): Payer: Medicare HMO

## 2017-06-18 ENCOUNTER — Encounter: Payer: Self-pay | Admitting: Family Medicine

## 2017-07-09 ENCOUNTER — Telehealth: Payer: Self-pay | Admitting: Family Medicine

## 2017-07-09 ENCOUNTER — Encounter: Payer: Self-pay | Admitting: General Surgery

## 2017-07-09 ENCOUNTER — Ambulatory Visit (INDEPENDENT_AMBULATORY_CARE_PROVIDER_SITE_OTHER): Payer: Medicare HMO | Admitting: General Surgery

## 2017-07-09 ENCOUNTER — Other Ambulatory Visit: Payer: Self-pay | Admitting: *Deleted

## 2017-07-09 VITALS — BP 127/92 | HR 99 | Temp 97.7°F | Resp 18 | Ht 63.0 in | Wt 144.0 lb

## 2017-07-09 DIAGNOSIS — K861 Other chronic pancreatitis: Secondary | ICD-10-CM | POA: Diagnosis not present

## 2017-07-09 MED ORDER — OXYCODONE-ACETAMINOPHEN 5-325 MG PO TABS: 1.0000 | ORAL_TABLET | Freq: Four times a day (QID) | ORAL | 0 refills | Status: DC | PRN

## 2017-07-09 NOTE — Patient Instructions (Signed)
[name] [name]  07/09/2017     @PREFPERIOPPHARMACY @   Your procedure is scheduled on  07/12/2017 .  Report to [facility] at   745  A.M.  Call this number if you have problems the morning of surgery:  [phone]   Remember:  Do not eat food or drink liquids after midnight.  Take these medicines the morning of surgery with A SIP OF WATER  Zofran, oxycodone, protonix, topamax. Take 1/2 of your usual insulin dosage the night before your surgery. DO NOT take any medications for diabetes the morning of your surgery.   Do not wear jewelry, make-up or nail polish.  Do not wear lotions, powders, or perfumes, or deoderant.  Do not shave 48 hours prior to surgery.  Men may shave face and neck.  Do not bring valuables to the hospital.  [facility] is not responsible for any belongings or valuables.  Contacts, dentures or bridgework may not be worn into surgery.  Leave your suitcase in the car.  After surgery it may be brought to your room.  For patients admitted to the hospital, discharge time will be determined by your treatment team.  Patients discharged the day of surgery will not be allowed to drive home.   Name and phone number of your driver:   family Special instructions:  None  Please read over the following fact sheets that you were given. Anesthesia Post-op Instructions and Care and Recovery After Surgery       Implanted [facility] Guide An implanted port is a type of central line that is placed under the skin. Central lines are used to provide IV access when treatment or nutrition needs to be given through a person's veins. Implanted ports are used for long-term IV access. An implanted port may be placed because:  You need IV medicine that would be irritating to the small veins in your hands or arms.  You need long-term IV medicines, such as antibiotics.  You need IV nutrition for a long period.  You need frequent blood draws for lab tests.  You  need dialysis.  Implanted ports are usually placed in the chest area, but they can also be placed in the upper arm, the abdomen, or the leg. An implanted port has two main parts:  Reservoir. The reservoir is round and will appear as a small, raised area under your skin. The reservoir is the part where a needle is inserted to give medicines or draw blood.  Catheter. The catheter is a thin, flexible tube that extends from the reservoir. The catheter is placed into a large vein. Medicine that is inserted into the reservoir goes into the catheter and then into the vein.  How will I care for my incision site? Do not get the incision site wet. Bathe or shower as directed by your health care provider. How is my port accessed? Special steps must be taken to access the port:  Before the port is accessed, a numbing cream can be placed on the skin. This helps numb the skin over the port site.  Your health care provider uses a sterile technique to access the port. ? Your health care provider must put on a mask and sterile gloves. ? The skin over your port is cleaned carefully with an antiseptic and allowed to dry. ? The port is gently pinched between sterile gloves, and a needle is inserted into the port.  Only "  non-coring" port needles should be used to access the port. Once the port is accessed, a blood return should be checked. This helps ensure that the port is in the vein and is not clogged.  If your port needs to remain accessed for a constant infusion, a clear (transparent) bandage will be placed over the needle site. The bandage and needle will need to be changed every week, or as directed by your health care provider.  Keep the bandage covering the needle clean and dry. Do not get it wet. Follow your health care provider's instructions on how to take a shower or bath while the port is accessed.  If your port does not need to stay accessed, no bandage is needed over the port.  What is  flushing? Flushing helps keep the port from getting clogged. Follow your health care provider's instructions on how and when to flush the port. Ports are usually flushed with saline solution or a medicine called heparin. The need for flushing will depend on how the port is used.  If the port is used for intermittent medicines or blood draws, the port will need to be flushed: ? After medicines have been given. ? After blood has been drawn. ? As part of routine maintenance.  If a constant infusion is running, the port may not need to be flushed.  How long will my port stay implanted? The port can stay in for as long as your health care provider thinks it is needed. When it is time for the port to come out, surgery will be done to remove it. The procedure is similar to the one performed when the port was put in. When should I seek immediate medical care? When you have an implanted port, you should seek immediate medical care if:  You notice a bad smell coming from the incision site.  You have swelling, redness, or drainage at the incision site.  You have more swelling or pain at the port site or the surrounding area.  You have a fever that is not controlled with medicine.  This information is not intended to replace advice given to you by your health care provider. Make sure you discuss any questions you have with your health care provider. Document Released: 09/24/2005 Document Revised: 03/01/2016 Document Reviewed: 06/01/2013 Elsevier Interactive Patient Education  2017 [employer]  Implanted Port Insertion Implanted port insertion is a procedure to put in a port and catheter. The port is a device with an injectable disk that can be accessed by your health care provider. The port is connected to a vein in the chest or neck by a small flexible tube (catheter). There are different types of ports. The implanted port may be used as a long-term IV access for:  Medicines, such as  chemotherapy.  Fluids.  Liquid nutrition, such as total parenteral nutrition (TPN).  Blood samples.  Having a port means that your health care provider will not need to use the veins in your arms for these procedures. Tell a health care provider about:  Any allergies you have.  All medicines you are taking, especially blood thinners, as well as any vitamins, herbs, eye drops, creams, over-the-counter medicines, and steroids.  Any problems you or family members have had with anesthetic medicines.  Any blood disorders you have.  Any surgeries you have had.  Any medical conditions you have, including diabetes or kidney problems.  Whether you are pregnant or may be pregnant. What are the risks? Generally, this is  a safe procedure. However, problems may occur, including:  Allergic reactions to medicines or dyes.  Damage to other structures or organs.  Infection.  Damage to the blood vessel, bruising, or bleeding at the puncture site.  Blood clot.  Breakdown of the skin over the port.  A collection of air in the chest that can cause one of the lungs to collapse (pneumothorax). This is rare.  What happens before the procedure? Staying hydrated Follow instructions from your health care provider about hydration, which may include:  Up to 2 hours before the procedure - you may continue to drink clear liquids, such as water, clear fruit juice, black coffee, and plain tea.  Eating and drinking restrictions  Follow instructions from your health care provider about eating and drinking, which may include: ? 8 hours before the procedure - stop eating heavy meals or foods such as meat, fried foods, or fatty foods. ? 6 hours before the procedure - stop eating light meals or foods, such as toast or cereal. ? 6 hours before the procedure - stop drinking milk or drinks that contain milk. ? 2 hours before the procedure - stop drinking clear liquids. Medicines  Ask your health care  provider about: ? Changing or stopping your regular medicines. This is especially important if you are taking diabetes medicines or blood thinners. ? Taking medicines such as aspirin and ibuprofen. These medicines can thin your blood. Do not take these medicines before your procedure if your health care provider instructs you not to.  You may be given antibiotic medicine to help prevent infection. General instructions  Plan to have someone take you home from the hospital or clinic.  If you will be going home right after the procedure, plan to have someone with you for 24 hours.  You may have blood tests.  You may be asked to shower with a germ-killing soap. What happens during the procedure?  To lower your risk of infection: ? Your health care team will wash or sanitize their hands. ? Your skin will be washed with soap. ? Hair may be removed from the surgical area.  An IV tube will be inserted into one of your veins.  You will be given one or more of the following: ? A medicine to help you relax (sedative). ? A medicine to numb the area (local anesthetic).  Two small cuts (incisions) will be made to insert the port. ? One incision will be made in your neck to get access to the vein where the catheter will lie. ? The other incision will be made in the upper chest. This is where the port will lie.  The procedure may be done using continuous X-ray (fluoroscopy) or other imaging tools for guidance.  The port and catheter will be placed. There may be a small, raised area where the port is.  The port will be flushed with a salt solution (saline), and blood will be drawn to make sure that it is working correctly.  The incisions will be closed.  Bandages (dressings) may be placed over the incisions. The procedure may vary among health care providers and hospitals. What happens after the procedure?  Your blood pressure, heart rate, breathing rate, and blood oxygen level will be  monitored until the medicines you were given have worn off.  Do not drive for 24 hours if you were given a sedative.  You will be given a manufacturer's information card for the type of port that you have. Keep this  with you.  Your port will need to be flushed and checked as told by your health care provider, usually every few weeks.  A chest X-ray will be done to: ? Check the placement of the port. ? Make sure there is no injury to your lung. Summary  Implanted port insertion is a procedure to put in a port and catheter.  The implanted port is used as a long-term IV access.  The port will need to be flushed and checked as told by your health care provider, usually every few weeks.  Keep your manufacturer's information card with you at all times. This information is not intended to replace advice given to you by your health care provider. Make sure you discuss any questions you have with your health care provider. Document Released: 07/15/2013 Document Revised: 08/15/2016 Document Reviewed: 08/15/2016 Elsevier Interactive Patient Education  2017 [employer] Inc.  Implanted Port Insertion, Care After This sheet gives you information about how to care for yourself after your procedure. Your health care provider may also give you more specific instructions. If you have problems or questions, contact your health care provider. What can I expect after the procedure? After your procedure, it is common to have:  Discomfort at the port insertion site.  Bruising on the skin over the port. This should improve over 3-4 days.  Follow these instructions at home: [facility] care  After your port is placed, you will get a manufacturer's information card. The card has information about your port. Keep this card with you at all times.  Take care of the port as told by your health care provider. Ask your health care provider if you or a family member can get training for taking care of the port at home.  A home health care nurse may also take care of the port.  Make sure to remember what type of port you have. Incision care  Follow instructions from your health care provider about how to take care of your port insertion site. Make sure you: ? Wash your hands with soap and water before you change your bandage (dressing). If soap and water are not available, use hand sanitizer. ? Change your dressing as told by your health care provider. ? Leave stitches (sutures), skin glue, or adhesive strips in place. These skin closures may need to stay in place for 2 weeks or longer. If adhesive strip edges start to loosen and curl up, you may trim the loose edges. Do not remove adhesive strips completely unless your health care provider tells you to do that.  Check your port insertion site every day for signs of infection. Check for: ? More redness, swelling, or pain. ? More fluid or blood. ? Warmth. ? Pus or a bad smell. General instructions  Do not take baths, swim, or use a hot tub until your health care provider approves.  Do not lift anything that is heavier than 10 lb (4.5 kg) for a week, or as told by your health care provider.  Ask your health care provider when it is okay to: ? Return to work or school. ? Resume usual physical activities or sports.  Do not drive for 24 hours if you were given a medicine to help you relax (sedative).  Take over-the-counter and prescription medicines only as told by your health care provider.  Wear a medical alert bracelet in case of an emergency. This will tell any health care providers that you have a port.  Keep all follow-up visits  as told by your health care provider. This is important. Contact a health care provider if:  You cannot flush your port with saline as directed, or you cannot draw blood from the port.  You have a fever or chills.  You have more redness, swelling, or pain around your port insertion site.  You have more fluid or blood  coming from your port insertion site.  Your port insertion site feels warm to the touch.  You have pus or a bad smell coming from the port insertion site. Get help right away if:  You have chest pain or shortness of breath.  You have bleeding from your port that you cannot control. Summary  Take care of the port as told by your health care provider.  Change your dressing as told by your health care provider.  Keep all follow-up visits as told by your health care provider. This information is not intended to replace advice given to you by your health care provider. Make sure you discuss any questions you have with your health care provider. Document Released: 07/15/2013 Document Revised: 08/15/2016 Document Reviewed: 08/15/2016 Elsevier Interactive Patient Education  2017 [employer].  Monitored Anesthesia Care Anesthesia is a term that refers to techniques, procedures, and medicines that help a person stay safe and comfortable during a medical procedure. Monitored anesthesia care, or sedation, is one type of anesthesia. Your anesthesia specialist may recommend sedation if you will be having a procedure that does not require you to be unconscious, such as:  Cataract surgery.  A dental procedure.  A biopsy.  A colonoscopy.  During the procedure, you may receive a medicine to help you relax (sedative). There are three levels of sedation:  Mild sedation. At this level, you may feel awake and relaxed. You will be able to follow directions.  Moderate sedation. At this level, you will be sleepy. You may not remember the procedure.  Deep sedation. At this level, you will be asleep. You will not remember the procedure.  The more medicine you are given, the deeper your level of sedation will be. Depending on how you respond to the procedure, the anesthesia specialist may change your level of sedation or the type of anesthesia to fit your needs. An anesthesia specialist will monitor  you closely during the procedure. Let your health care provider know about:  Any allergies you have.  All medicines you are taking, including vitamins, herbs, eye drops, creams, and over-the-counter medicines.  Any use of steroids (by mouth or as a cream).  Any problems you or family members have had with sedatives and anesthetic medicines.  Any blood disorders you have.  Any surgeries you have had.  Any medical conditions you have, such as sleep apnea.  Whether you are pregnant or may be pregnant.  Any use of cigarettes, alcohol, or street drugs. What are the risks? Generally, this is a safe procedure. However, problems may occur, including:  Getting too much medicine (oversedation).  Nausea.  Allergic reaction to medicines.  Trouble breathing. If this happens, a breathing tube may be used to help with breathing. It will be removed when you are awake and breathing on your own.  Heart trouble.  Lung trouble.  Before the procedure Staying hydrated Follow instructions from your health care provider about hydration, which may include:  Up to 2 hours before the procedure - you may continue to drink clear liquids, such as water, clear fruit juice, black coffee, and plain tea.  Eating and drinking  restrictions Follow instructions from your health care provider about eating and drinking, which may include:  8 hours before the procedure - stop eating heavy meals or foods such as meat, fried foods, or fatty foods.  6 hours before the procedure - stop eating light meals or foods, such as toast or cereal.  6 hours before the procedure - stop drinking milk or drinks that contain milk.  2 hours before the procedure - stop drinking clear liquids.  Medicines Ask your health care provider about:  Changing or stopping your regular medicines. This is especially important if you are taking diabetes medicines or blood thinners.  Taking medicines such as aspirin and ibuprofen.  These medicines can thin your blood. Do not take these medicines before your procedure if your health care provider instructs you not to.  Tests and exams  You will have a physical exam.  You may have blood tests done to show: ? How well your kidneys and liver are working. ? How well your blood can clot.  General instructions  Plan to have someone take you home from the hospital or clinic.  If you will be going home right after the procedure, plan to have someone with you for 24 hours.  What happens during the procedure?  Your blood pressure, heart rate, breathing, level of pain and overall condition will be monitored.  An IV tube will be inserted into one of your veins.  Your anesthesia specialist will give you medicines as needed to keep you comfortable during the procedure. This may mean changing the level of sedation.  The procedure will be performed. After the procedure  Your blood pressure, heart rate, breathing rate, and blood oxygen level will be monitored until the medicines you were given have worn off.  Do not drive for 24 hours if you received a sedative.  You may: ? Feel sleepy, clumsy, or nauseous. ? Feel forgetful about what happened after the procedure. ? Have a sore throat if you had a breathing tube during the procedure. ? Vomit. This information is not intended to replace advice given to you by your health care provider. Make sure you discuss any questions you have with your health care provider. Document Released: 06/20/2005 Document Revised: 03/02/2016 Document Reviewed: 01/15/2016 Elsevier Interactive Patient Education  2018 [employer]. Monitored Anesthesia Care, Care After These instructions provide you with information about caring for yourself after your procedure. Your health care provider may also give you more specific instructions. Your treatment has been planned according to current medical practices, but problems sometimes occur. Call your  health care provider if you have any problems or questions after your procedure. What can I expect after the procedure? After your procedure, it is common to:  Feel sleepy for several hours.  Feel clumsy and have poor balance for several hours.  Feel forgetful about what happened after the procedure.  Have poor judgment for several hours.  Feel nauseous or vomit.  Have a sore throat if you had a breathing tube during the procedure.  Follow these instructions at home: For at least 24 hours after the procedure:   Do not: ? Participate in activities in which you could fall or become injured. ? Drive. ? Use heavy machinery. ? Drink alcohol. ? Take sleeping pills or medicines that cause drowsiness. ? Make important decisions or sign legal documents. ? Take care of children on your own.  Rest. Eating and drinking  Follow the diet that is recommended by your health care  provider.  If you vomit, drink water, juice, or soup when you can drink without vomiting.  Make sure you have little or no nausea before eating solid foods. General instructions  Have a responsible adult stay with you until you are awake and alert.  Take over-the-counter and prescription medicines only as told by your health care provider.  If you smoke, do not smoke without supervision.  Keep all follow-up visits as told by your health care provider. This is important. Contact a health care provider if:  You keep feeling nauseous or you keep vomiting.  You feel light-headed.  You develop a rash.  You have a fever. Get help right away if:  You have trouble breathing. This information is not intended to replace advice given to you by your health care provider. Make sure you discuss any questions you have with your health care provider. Document Released: 01/15/2016 Document Revised: 05/16/2016 Document Reviewed: 01/15/2016 Elsevier Interactive Patient Education  [employer].

## 2017-07-09 NOTE — Telephone Encounter (Signed)
Script ready for pickup. Pt notified.  

## 2017-07-09 NOTE — H&P (Signed)
[name] [name]; [id number]; 03/10/1971   HPI  Patient is a 46-year-old white female who was referred to my care for Port-A-Cath insertion. She is followed by Dr. Steve Luking. She suffers from chronic pancreatitis and requires frequent blood testing and x-rays. She has become a difficult IV access patient. She currently has a pain level of 6 out of 10. Past Medical History:  Diagnosis Date  . Acute bacterial bronchitis    dx 12-06-2014  --  FINISHED Z-PAC-  NO FEVER BUT STILL INTERMITTANT  PRODUCTIVE COUGH  . Anxiety   . Chronic pain    has pain specialist  . GERD (gastroesophageal reflux disease)   . History of chronic gastritis   . History of chronic pancreatitis    multple recurrent--  s/p  whipple 2011  . History of esophageal ulcer   . History of kidney stones   . History of subarachnoid hemorrhage    01-08-2013--  fell on concrete--  resolved without surgical intervention  . History of suicide attempt    hx multiple attempts by overdosing of meds  . IBS (irritable bowel syndrome)   . Left ureteral calculus   . Major depression, recurrent, chronic (HCC)    w/  Hx  multiple suicide attempts (overdose)  . Migraine headache   . Type 2 diabetes mellitus, uncontrolled (HCC)   . Whipple disease 2011    Past Surgical History:  Procedure Laterality Date  . CESAREAN SECTION  x2  last one 02-29-2000   last one w/ BILATERAL TUBAL LIGATION  . COLONOSCOPY  03-14-2009   dx ileocolonscopy  . CYSTOSCOPY WITH RETROGRADE PYELOGRAM, URETEROSCOPY AND STENT PLACEMENT Left 12/20/2014   Procedure: CYSTOSCOPY, LEFT URETEROSCOPY, STONE REMOVAL, LASER LITHOTRIPSY;  Surgeon: David Grapey, MD;  Location: [facility];  Service: Urology;  Laterality: Left;  . ESOPHAGOGASTRODUODENOSCOPY  last  one 12-31-2008  . ESOPHAGOGASTRODUODENOSCOPY (EGD) WITH PROPOFOL N/A 05/28/2017   Procedure: ESOPHAGOGASTRODUODENOSCOPY (EGD) WITH PROPOFOL;  Surgeon: Rehman, Najeeb U, MD;  Location: [facility];   Service: Endoscopy;  Laterality: N/A;  2:00  . HOLMIUM LASER APPLICATION Left 12/20/2014   Procedure: HOLMIUM LASER APPLICATION;  Surgeon: David Grapey, MD;  Location: [facility];  Service: Urology;  Laterality: Left;  . LAPAROSCOPIC ASSISTED VAGINAL HYSTERECTOMY  12-16-2006  . LAPAROSCOPIC NISSEN FUNDOPLICATION  Nov 2001   w/  CHOLECYSTECTOMY  . REPAIR PERFORATED VISCUS  POST WHIPPLE  x5    last one 06-23-2012  . TUBAL LIGATION    . WHIPPLE PROCEDURE  2011   at [facility]    Family History  Problem Relation Age of Onset  . Diabetes Father     Current Outpatient Prescriptions on File Prior to Visit  Medication Sig Dispense Refill  . insulin aspart (NOVOLOG) 100 UNIT/ML injection Inject 10-30 Units into the skin 3 (three) times daily before meals.    . Insulin Glargine (LANTUS SOLOSTAR) 100 UNIT/ML Solostar Pen Inject 40 Units into the skin at bedtime.     . nortriptyline (PAMELOR) 25 MG capsule TAKE 4 CAPSULES BY MOUTH AT BEDTIME. 120 capsule 5  . ondansetron (ZOFRAN ODT) 4 MG disintegrating tablet Take 1 tablet (4 mg total) by mouth every 8 (eight) hours as needed for nausea or vomiting. 20 tablet 0  . pantoprazole (PROTONIX) 40 MG tablet TAKE ONE TABLET BY MOUTH ONCE DAILY. 30 tablet 4  . promethazine (PHENERGAN) 50 MG tablet TAKE 1 TABLET BY MOUTH EVERY 6 HOURS AS NEEDED FOR NAUSEA OR VOMITING. 30 tablet 5  .   sucralfate (CARAFATE) 1 g tablet Take 1 tablet (1 g total) by mouth 4 (four) times daily -  with meals and at bedtime. 120 tablet 1  . topiramate (TOPAMAX) 25 MG tablet TAKE 1 TABLET EVERY MORNING AND 2 TABLETS AT BEDTIME. 90 tablet 5  . UNIFINE PENTIPS 31G X 6 MM MISC     . zolpidem (AMBIEN) 10 MG tablet TAKE ONE TABLET BY MOUTH AT BEDTIME AS NEEDED FOR SLEEP. 30 tablet 5   Current Facility-Administered Medications on File Prior to Visit  Medication Dose Route Frequency Provider Last Rate Last Dose  . lipase/protease/amylase (CREON) capsule 12,000 Units  12,000 Units  Oral TID AC Setzer, Terri L, NP        Allergies  Allergen Reactions  . Buspar [Buspirone] Other (See Comments)    Dizziness  . Metoclopramide Hcl Other (See Comments)    "lock-jaw"    History  Alcohol Use No    History  Smoking Status  . Never Smoker  Smokeless Tobacco  . Never Used    Review of Systems  Constitutional: Negative.   HENT: Negative.   Eyes: Negative.   Respiratory: Negative.   Cardiovascular: Negative.   Gastrointestinal: Positive for abdominal pain, heartburn and nausea.  Genitourinary: Negative.   Musculoskeletal: Negative.   Skin: Negative.   Neurological: Negative.   Endo/Heme/Allergies: Negative.   Psychiatric/Behavioral: Negative.     Objective   Vitals:   07/09/17 1357  BP: (!) 127/92  Pulse: 99  Resp: 18  Temp: 97.7 F (36.5 C)    Physical Exam  Constitutional: She is oriented to person, place, and time and well-developed, well-nourished, and in no distress.  HENT:  Head: Normocephalic and atraumatic.  Cardiovascular: Normal rate, regular rhythm and normal heart sounds.  Exam reveals no gallop and no friction rub.   No murmur heard. Pulmonary/Chest: Effort normal and breath sounds normal. No respiratory distress. She has no wheezes. She has no rales.  Neurological: She is alert and oriented to person, place, and time.  Skin: Skin is warm and dry.  Vitals reviewed.   Assessment  Chronic pancreatitis, need for central venous access Plan   Patient scheduled for Port-A-Cath insertion on 07/12/2017. The risks and benefits of the procedure including bleeding, infection, and pneumothorax were fully explained to the patient, who gave informed consent. 

## 2017-07-09 NOTE — Progress Notes (Signed)
[name] [name]; [id number]; [birth year]   HPI  Patient is a 46 year old white female who was referred to my care for Port-A-Cath insertion. She is followed by Dr. [name]. She suffers from chronic pancreatitis and requires frequent blood testing and x-rays. She has become a difficult IV access patient. She currently has a pain level of 6 out of 10. Past Medical History:  Diagnosis Date  . Acute bacterial bronchitis    dx 12-06-2014  --  FINISHED Z-PAC-  NO FEVER BUT STILL INTERMITTANT  PRODUCTIVE COUGH  . Anxiety   . Chronic pain    has pain specialist  . GERD (gastroesophageal reflux disease)   . History of chronic gastritis   . History of chronic pancreatitis    multple recurrent--  s/p  whipple 2011  . History of esophageal ulcer   . History of kidney stones   . History of subarachnoid hemorrhage    01-08-2013--  fell on concrete--  resolved without surgical intervention  . History of suicide attempt    hx multiple attempts by overdosing of meds  . IBS (irritable bowel syndrome)   . Left ureteral calculus   . Major depression, recurrent, chronic (HCC)    w/  Hx  multiple suicide attempts (overdose)  . Migraine headache   . Type 2 diabetes mellitus, uncontrolled (HCC)   . Whipple disease 2011    Past Surgical History:  Procedure Laterality Date  . CESAREAN SECTION  x2  last one 02-29-2000   last one w/ BILATERAL TUBAL LIGATION  . COLONOSCOPY  03-14-2009   dx ileocolonscopy  . CYSTOSCOPY WITH RETROGRADE PYELOGRAM, URETEROSCOPY AND STENT PLACEMENT Left 12/20/2014   Procedure: CYSTOSCOPY, LEFT URETEROSCOPY, STONE REMOVAL, LASER LITHOTRIPSY;  Surgeon: Barron Alvine, MD;  Location: [facility];  Service: Urology;  Laterality: Left;  . ESOPHAGOGASTRODUODENOSCOPY  last  one 12-31-2008  . ESOPHAGOGASTRODUODENOSCOPY (EGD) WITH PROPOFOL N/A 05/28/2017   Procedure: ESOPHAGOGASTRODUODENOSCOPY (EGD) WITH PROPOFOL;  Surgeon: Malissa Hippo, MD;  Location: [facility];   Service: Endoscopy;  Laterality: N/A;  2:00  . HOLMIUM LASER APPLICATION Left 12/20/2014   Procedure: HOLMIUM LASER APPLICATION;  Surgeon: Barron Alvine, MD;  Location: [facility];  Service: Urology;  Laterality: Left;  . LAPAROSCOPIC ASSISTED VAGINAL HYSTERECTOMY  12-16-2006  . LAPAROSCOPIC NISSEN FUNDOPLICATION  Nov 2001   w/  CHOLECYSTECTOMY  . REPAIR PERFORATED VISCUS  POST WHIPPLE  x5    last one 06-23-2012  . TUBAL LIGATION    . WHIPPLE PROCEDURE  2011   at [facility] History  Problem Relation Age of Onset  . Diabetes Father     Current Outpatient Prescriptions on File Prior to Visit  Medication Sig Dispense Refill  . insulin aspart (NOVOLOG) 100 UNIT/ML injection Inject 10-30 Units into the skin 3 (three) times daily before meals.    . Insulin Glargine (LANTUS SOLOSTAR) 100 UNIT/ML Solostar Pen Inject 40 Units into the skin at bedtime.     . nortriptyline (PAMELOR) 25 MG capsule TAKE 4 CAPSULES BY MOUTH AT BEDTIME. 120 capsule 5  . ondansetron (ZOFRAN ODT) 4 MG disintegrating tablet Take 1 tablet (4 mg total) by mouth every 8 (eight) hours as needed for nausea or vomiting. 20 tablet 0  . pantoprazole (PROTONIX) 40 MG tablet TAKE ONE TABLET BY MOUTH ONCE DAILY. 30 tablet 4  . promethazine (PHENERGAN) 50 MG tablet TAKE 1 TABLET BY MOUTH EVERY 6 HOURS AS NEEDED FOR NAUSEA OR VOMITING. 30 tablet 5  .  sucralfate (CARAFATE) 1 g tablet Take 1 tablet (1 g total) by mouth 4 (four) times daily -  with meals and at bedtime. 120 tablet 1  . topiramate (TOPAMAX) 25 MG tablet TAKE 1 TABLET EVERY MORNING AND 2 TABLETS AT BEDTIME. 90 tablet 5  . UNIFINE PENTIPS 31G X 6 MM MISC     . zolpidem (AMBIEN) 10 MG tablet TAKE ONE TABLET BY MOUTH AT BEDTIME AS NEEDED FOR SLEEP. 30 tablet 5   Current Facility-Administered Medications on File Prior to Visit  Medication Dose Route Frequency Provider Last Rate Last Dose  . lipase/protease/amylase (CREON) capsule 12,000 Units  12,000 Units  Oral TID AC Setzer, Terri L, NP        Allergies  Allergen Reactions  . Buspar [Buspirone] Other (See Comments)    Dizziness  . Metoclopramide Hcl Other (See Comments)    "lock-jaw"    History  Alcohol Use No    History  Smoking Status  . Never Smoker  Smokeless Tobacco  . Never Used    Review of Systems  Constitutional: Negative.   HENT: Negative.   Eyes: Negative.   Respiratory: Negative.   Cardiovascular: Negative.   Gastrointestinal: Positive for abdominal pain, heartburn and nausea.  Genitourinary: Negative.   Musculoskeletal: Negative.   Skin: Negative.   Neurological: Negative.   Endo/Heme/Allergies: Negative.   Psychiatric/Behavioral: Negative.     Objective   Vitals:   07/09/17 1357  BP: (!) 127/92  Pulse: 99  Resp: 18  Temp: 97.7 F (36.5 C)    Physical Exam  Constitutional: She is oriented to person, place, and time and well-developed, well-nourished, and in no distress.  HENT:  Head: Normocephalic and atraumatic.  Cardiovascular: Normal rate, regular rhythm and normal heart sounds.  Exam reveals no gallop and no friction rub.   No murmur heard. Pulmonary/Chest: Effort normal and breath sounds normal. No respiratory distress. She has no wheezes. She has no rales.  Neurological: She is alert and oriented to person, place, and time.  Skin: Skin is warm and dry.  Vitals reviewed.   Assessment  Chronic pancreatitis, need for central venous access Plan   Patient scheduled for Port-A-Cath insertion on 07/12/2017. The risks and benefits of the procedure including bleeding, infection, and pneumothorax were fully explained to the patient, who gave informed consent.

## 2017-07-09 NOTE — Telephone Encounter (Signed)
Last seen 06/12/17

## 2017-07-09 NOTE — Telephone Encounter (Signed)
Pt is requesting a prescription for pain medication. Pt states that she is getting the port put in today to start the testing that she needs. Please advise.

## 2017-07-09 NOTE — Telephone Encounter (Signed)
Oxy 5/325 numb 16 one up tp q six hrs prn pain

## 2017-07-09 NOTE — Patient Instructions (Signed)
Implanted Port Insertion Implanted port insertion is a procedure to put in a port and catheter. The port is a device with an injectable disk that can be accessed by your health care provider. The port is connected to a vein in the chest or neck by a small flexible tube (catheter). There are different types of ports. The implanted port may be used as a long-term IV access for:  Medicines, such as chemotherapy.  Fluids.  Liquid nutrition, such as total parenteral nutrition (TPN).  Blood samples.  Having a port means that your health care provider will not need to use the veins in your arms for these procedures. Tell a health care provider about:  Any allergies you have.  All medicines you are taking, especially blood thinners, as well as any vitamins, herbs, eye drops, creams, over-the-counter medicines, and steroids.  Any problems you or family members have had with anesthetic medicines.  Any blood disorders you have.  Any surgeries you have had.  Any medical conditions you have, including diabetes or kidney problems.  Whether you are pregnant or may be pregnant. What are the risks? Generally, this is a safe procedure. However, problems may occur, including:  Allergic reactions to medicines or dyes.  Damage to other structures or organs.  Infection.  Damage to the blood vessel, bruising, or bleeding at the puncture site.  Blood clot.  Breakdown of the skin over the port.  A collection of air in the chest that can cause one of the lungs to collapse (pneumothorax). This is rare.  What happens before the procedure? Staying hydrated Follow instructions from your health care provider about hydration, which may include:  Up to 2 hours before the procedure - you may continue to drink clear liquids, such as water, clear fruit juice, black coffee, and plain tea.  Eating and drinking restrictions  Follow instructions from your health care provider about eating and drinking,  which may include: ? 8 hours before the procedure - stop eating heavy meals or foods such as meat, fried foods, or fatty foods. ? 6 hours before the procedure - stop eating light meals or foods, such as toast or cereal. ? 6 hours before the procedure - stop drinking milk or drinks that contain milk. ? 2 hours before the procedure - stop drinking clear liquids. Medicines  Ask your health care provider about: ? Changing or stopping your regular medicines. This is especially important if you are taking diabetes medicines or blood thinners. ? Taking medicines such as aspirin and ibuprofen. These medicines can thin your blood. Do not take these medicines before your procedure if your health care provider instructs you not to.  You may be given antibiotic medicine to help prevent infection. General instructions  Plan to have someone take you home from the hospital or clinic.  If you will be going home right after the procedure, plan to have someone with you for 24 hours.  You may have blood tests.  You may be asked to shower with a germ-killing soap. What happens during the procedure?  To lower your risk of infection: ? Your health care team will wash or sanitize their hands. ? Your skin will be washed with soap. ? Hair may be removed from the surgical area.  An IV tube will be inserted into one of your veins.  You will be given one or more of the following: ? A medicine to help you relax (sedative). ? A medicine to numb the area (local anesthetic).    Two small cuts (incisions) will be made to insert the port. ? One incision will be made in your neck to get access to the vein where the catheter will lie. ? The other incision will be made in the upper chest. This is where the port will lie.  The procedure may be done using continuous X-ray (fluoroscopy) or other imaging tools for guidance.  The port and catheter will be placed. There may be a small, raised area where the port  is.  The port will be flushed with a salt solution (saline), and blood will be drawn to make sure that it is working correctly.  The incisions will be closed.  Bandages (dressings) may be placed over the incisions. The procedure may vary among health care providers and hospitals. What happens after the procedure?  Your blood pressure, heart rate, breathing rate, and blood oxygen level will be monitored until the medicines you were given have worn off.  Do not drive for 24 hours if you were given a sedative.  You will be given a manufacturer's information card for the type of port that you have. Keep this with you.  Your port will need to be flushed and checked as told by your health care provider, usually every few weeks.  A chest X-ray will be done to: ? Check the placement of the port. ? Make sure there is no injury to your lung. Summary  Implanted port insertion is a procedure to put in a port and catheter.  The implanted port is used as a long-term IV access.  The port will need to be flushed and checked as told by your health care provider, usually every few weeks.  Keep your manufacturer's information card with you at all times. This information is not intended to replace advice given to you by your health care provider. Make sure you discuss any questions you have with your health care provider. Document Released: 07/15/2013 Document Revised: 08/15/2016 Document Reviewed: 08/15/2016 Elsevier Interactive Patient Education  2017 [employer] Implanted Port Home Guide An implanted port is a type of central line that is placed under the skin. Central lines are used to provide IV access when treatment or nutrition needs to be given through a person's veins. Implanted ports are used for long-term IV access. An implanted port may be placed because:  You need IV medicine that would be irritating to the small veins in your hands or arms.  You need long-term IV medicines, such as  antibiotics.  You need IV nutrition for a long period.  You need frequent blood draws for lab tests.  You need dialysis.  Implanted ports are usually placed in the chest area, but they can also be placed in the upper arm, the abdomen, or the leg. An implanted port has two main parts:  Reservoir. The reservoir is round and will appear as a small, raised area under your skin. The reservoir is the part where a needle is inserted to give medicines or draw blood.  Catheter. The catheter is a thin, flexible tube that extends from the reservoir. The catheter is placed into a large vein. Medicine that is inserted into the reservoir goes into the catheter and then into the vein.  How will I care for my incision site? Do not get the incision site wet. Bathe or shower as directed by your health care provider. How is my port accessed? Special steps must be taken to access the port:  Before the port is accessed,   a numbing cream can be placed on the skin. This helps numb the skin over the port site.  Your health care provider uses a sterile technique to access the port. ? Your health care provider must put on a mask and sterile gloves. ? The skin over your port is cleaned carefully with an antiseptic and allowed to dry. ? The port is gently pinched between sterile gloves, and a needle is inserted into the port.  Only "non-coring" port needles should be used to access the port. Once the port is accessed, a blood return should be checked. This helps ensure that the port is in the vein and is not clogged.  If your port needs to remain accessed for a constant infusion, a clear (transparent) bandage will be placed over the needle site. The bandage and needle will need to be changed every week, or as directed by your health care provider.  Keep the bandage covering the needle clean and dry. Do not get it wet. Follow your health care provider's instructions on how to take a shower or bath while the port is  accessed.  If your port does not need to stay accessed, no bandage is needed over the port.  What is flushing? Flushing helps keep the port from getting clogged. Follow your health care provider's instructions on how and when to flush the port. Ports are usually flushed with saline solution or a medicine called heparin. The need for flushing will depend on how the port is used.  If the port is used for intermittent medicines or blood draws, the port will need to be flushed: ? After medicines have been given. ? After blood has been drawn. ? As part of routine maintenance.  If a constant infusion is running, the port may not need to be flushed.  How long will my port stay implanted? The port can stay in for as long as your health care provider thinks it is needed. When it is time for the port to come out, surgery will be done to remove it. The procedure is similar to the one performed when the port was put in. When should I seek immediate medical care? When you have an implanted port, you should seek immediate medical care if:  You notice a bad smell coming from the incision site.  You have swelling, redness, or drainage at the incision site.  You have more swelling or pain at the port site or the surrounding area.  You have a fever that is not controlled with medicine.  This information is not intended to replace advice given to you by your health care provider. Make sure you discuss any questions you have with your health care provider. Document Released: 09/24/2005 Document Revised: 03/01/2016 Document Reviewed: 06/01/2013 Elsevier Interactive Patient Education  2017 [employer]  

## 2017-07-10 ENCOUNTER — Encounter (HOSPITAL_COMMUNITY): Payer: Self-pay

## 2017-07-10 ENCOUNTER — Encounter (HOSPITAL_COMMUNITY)
Admission: RE | Admit: 2017-07-10 | Discharge: 2017-07-10 | Disposition: A | Discharge status: Home / Self Care | Payer: Medicare HMO | Source: Ambulatory Visit | Attending: General Surgery | Admitting: General Surgery

## 2017-07-10 DIAGNOSIS — Z915 Personal history of self-harm: Secondary | ICD-10-CM | POA: Diagnosis not present

## 2017-07-10 DIAGNOSIS — Z87820 Personal history of traumatic brain injury: Secondary | ICD-10-CM | POA: Diagnosis not present

## 2017-07-10 DIAGNOSIS — Z87442 Personal history of urinary calculi: Secondary | ICD-10-CM | POA: Diagnosis not present

## 2017-07-10 DIAGNOSIS — F419 Anxiety disorder, unspecified: Secondary | ICD-10-CM | POA: Diagnosis not present

## 2017-07-10 DIAGNOSIS — Z888 Allergy status to other drugs, medicaments and biological substances status: Secondary | ICD-10-CM | POA: Diagnosis not present

## 2017-07-10 DIAGNOSIS — Z833 Family history of diabetes mellitus: Secondary | ICD-10-CM | POA: Diagnosis not present

## 2017-07-10 DIAGNOSIS — K589 Irritable bowel syndrome without diarrhea: Secondary | ICD-10-CM | POA: Diagnosis not present

## 2017-07-10 DIAGNOSIS — Z9851 Tubal ligation status: Secondary | ICD-10-CM | POA: Diagnosis not present

## 2017-07-10 DIAGNOSIS — E119 Type 2 diabetes mellitus without complications: Secondary | ICD-10-CM | POA: Diagnosis not present

## 2017-07-10 DIAGNOSIS — Z79899 Other long term (current) drug therapy: Secondary | ICD-10-CM | POA: Diagnosis not present

## 2017-07-10 DIAGNOSIS — Z9889 Other specified postprocedural states: Secondary | ICD-10-CM | POA: Diagnosis not present

## 2017-07-10 DIAGNOSIS — K861 Other chronic pancreatitis: Secondary | ICD-10-CM | POA: Diagnosis not present

## 2017-07-10 DIAGNOSIS — Z794 Long term (current) use of insulin: Secondary | ICD-10-CM | POA: Diagnosis not present

## 2017-07-10 DIAGNOSIS — K219 Gastro-esophageal reflux disease without esophagitis: Secondary | ICD-10-CM | POA: Diagnosis not present

## 2017-07-10 DIAGNOSIS — G8929 Other chronic pain: Secondary | ICD-10-CM | POA: Diagnosis not present

## 2017-07-10 LAB — CBC WITH DIFFERENTIAL/PLATELET
Basophils Absolute: 0 10*3/uL (ref 0.0–0.1)
Basophils Relative: 0 %
Eosinophils Absolute: 0 10*3/uL (ref 0.0–0.7)
Eosinophils Relative: 1 %
HCT: 41.4 % (ref 36.0–46.0)
HEMOGLOBIN: 14.4 g/dL (ref 12.0–15.0)
LYMPHS ABS: 2.3 10*3/uL (ref 0.7–4.0)
LYMPHS PCT: 40 %
MCH: 33.6 pg (ref 26.0–34.0)
MCHC: 34.8 g/dL (ref 30.0–36.0)
MCV: 96.5 fL (ref 78.0–100.0)
Monocytes Absolute: 0.3 10*3/uL (ref 0.1–1.0)
Monocytes Relative: 5 %
NEUTROS PCT: 54 %
Neutro Abs: 3.2 10*3/uL (ref 1.7–7.7)
Platelets: 191 10*3/uL (ref 150–400)
RBC: 4.29 MIL/uL (ref 3.87–5.11)
RDW: 12.8 % (ref 11.5–15.5)
WBC: 5.8 10*3/uL (ref 4.0–10.5)

## 2017-07-10 LAB — BASIC METABOLIC PANEL
Anion gap: 10 (ref 5–15)
BUN: 9 mg/dL (ref 6–20)
CHLORIDE: 106 mmol/L (ref 101–111)
CO2: 18 mmol/L — ABNORMAL LOW (ref 22–32)
Calcium: 8.6 mg/dL — ABNORMAL LOW (ref 8.9–10.3)
Creatinine, Ser: 0.49 mg/dL (ref 0.44–1.00)
GFR calc Af Amer: >60 mL/min (ref >60)
GFR calc non Af Amer: >60 mL/min (ref >60)
Glucose, Bld: 249 mg/dL — ABNORMAL HIGH (ref 65–99)
POTASSIUM: 3.7 mmol/L (ref 3.5–5.1)
SODIUM: 134 mmol/L — AB (ref 135–145)

## 2017-07-12 ENCOUNTER — Encounter (HOSPITAL_COMMUNITY): Payer: Self-pay

## 2017-07-12 ENCOUNTER — Encounter (HOSPITAL_COMMUNITY)
Admission: RE | Disposition: A | Discharge status: Home / Self Care | Payer: Self-pay | Source: Ambulatory Visit | Attending: General Surgery

## 2017-07-12 ENCOUNTER — Ambulatory Visit (HOSPITAL_COMMUNITY): Payer: Medicare HMO | Admitting: Anesthesiology

## 2017-07-12 ENCOUNTER — Ambulatory Visit (HOSPITAL_COMMUNITY): Payer: Medicare HMO

## 2017-07-12 ENCOUNTER — Ambulatory Visit (HOSPITAL_COMMUNITY)
Admission: RE | Admit: 2017-07-12 | Discharge: 2017-07-12 | Disposition: A | Discharge status: Home / Self Care | Payer: Medicare HMO | Source: Ambulatory Visit | Attending: General Surgery | Admitting: General Surgery

## 2017-07-12 DIAGNOSIS — Z95828 Presence of other vascular implants and grafts: Secondary | ICD-10-CM

## 2017-07-12 DIAGNOSIS — Z915 Personal history of self-harm: Secondary | ICD-10-CM | POA: Insufficient documentation

## 2017-07-12 DIAGNOSIS — Z794 Long term (current) use of insulin: Secondary | ICD-10-CM | POA: Insufficient documentation

## 2017-07-12 DIAGNOSIS — F419 Anxiety disorder, unspecified: Secondary | ICD-10-CM | POA: Insufficient documentation

## 2017-07-12 DIAGNOSIS — E119 Type 2 diabetes mellitus without complications: Secondary | ICD-10-CM | POA: Diagnosis not present

## 2017-07-12 DIAGNOSIS — K219 Gastro-esophageal reflux disease without esophagitis: Secondary | ICD-10-CM | POA: Diagnosis not present

## 2017-07-12 DIAGNOSIS — Z79899 Other long term (current) drug therapy: Secondary | ICD-10-CM | POA: Insufficient documentation

## 2017-07-12 DIAGNOSIS — Z888 Allergy status to other drugs, medicaments and biological substances status: Secondary | ICD-10-CM | POA: Insufficient documentation

## 2017-07-12 DIAGNOSIS — K589 Irritable bowel syndrome without diarrhea: Secondary | ICD-10-CM | POA: Diagnosis not present

## 2017-07-12 DIAGNOSIS — K861 Other chronic pancreatitis: Secondary | ICD-10-CM | POA: Insufficient documentation

## 2017-07-12 DIAGNOSIS — G8929 Other chronic pain: Secondary | ICD-10-CM | POA: Diagnosis not present

## 2017-07-12 DIAGNOSIS — Z87442 Personal history of urinary calculi: Secondary | ICD-10-CM | POA: Insufficient documentation

## 2017-07-12 DIAGNOSIS — Z9851 Tubal ligation status: Secondary | ICD-10-CM | POA: Insufficient documentation

## 2017-07-12 DIAGNOSIS — Z87820 Personal history of traumatic brain injury: Secondary | ICD-10-CM | POA: Insufficient documentation

## 2017-07-12 DIAGNOSIS — Z9889 Other specified postprocedural states: Secondary | ICD-10-CM | POA: Insufficient documentation

## 2017-07-12 DIAGNOSIS — Z452 Encounter for adjustment and management of vascular access device: Secondary | ICD-10-CM | POA: Diagnosis not present

## 2017-07-12 DIAGNOSIS — Z833 Family history of diabetes mellitus: Secondary | ICD-10-CM | POA: Insufficient documentation

## 2017-07-12 HISTORY — PX: PORTACATH PLACEMENT: SHX2246

## 2017-07-12 LAB — GLUCOSE, CAPILLARY: GLUCOSE-CAPILLARY: 97 mg/dL (ref 65–99)

## 2017-07-12 SURGERY — INSERTION, TUNNELED CENTRAL VENOUS DEVICE, WITH PORT
Anesthesia: Monitor Anesthesia Care | Site: Chest

## 2017-07-12 MED ORDER — CHLORHEXIDINE GLUCONATE CLOTH 2 % EX PADS: 6.0000 | MEDICATED_PAD | Freq: Once | CUTANEOUS | Status: DC

## 2017-07-12 MED ORDER — FENTANYL CITRATE (PF) 100 MCG/2ML IJ SOLN
INTRAMUSCULAR | Status: AC
  Filled 2017-07-12: qty 2

## 2017-07-12 MED ORDER — GLYCOPYRROLATE 0.2 MG/ML IJ SOLN
INTRAMUSCULAR | Status: DC | PRN
  Administered 2017-07-12: 0.1 mg via INTRAVENOUS

## 2017-07-12 MED ORDER — KETOROLAC TROMETHAMINE 30 MG/ML IJ SOLN
30.0000 mg | Freq: Once | INTRAMUSCULAR | Status: AC
  Administered 2017-07-12: 30 mg via INTRAVENOUS

## 2017-07-12 MED ORDER — KETOROLAC TROMETHAMINE 30 MG/ML IJ SOLN
INTRAMUSCULAR | Status: AC
  Filled 2017-07-12: qty 1

## 2017-07-12 MED ORDER — SODIUM CHLORIDE 0.9 % IV SOLN
INTRAVENOUS | Status: AC | PRN
  Administered 2017-07-12: 500 mL

## 2017-07-12 MED ORDER — FENTANYL CITRATE (PF) 100 MCG/2ML IJ SOLN
25.0000 ug | INTRAMUSCULAR | Status: DC | PRN
  Administered 2017-07-12 (×2): 50 ug via INTRAVENOUS

## 2017-07-12 MED ORDER — LIDOCAINE HCL (PF) 1 % IJ SOLN
INTRAMUSCULAR | Status: AC
  Filled 2017-07-12: qty 30

## 2017-07-12 MED ORDER — HEPARIN SOD (PORK) LOCK FLUSH 100 UNIT/ML IV SOLN
INTRAVENOUS | Status: AC
  Filled 2017-07-12: qty 5

## 2017-07-12 MED ORDER — PROPOFOL 500 MG/50ML IV EMUL
INTRAVENOUS | Status: DC | PRN
  Administered 2017-07-12: 100 ug/kg/min via INTRAVENOUS

## 2017-07-12 MED ORDER — ONDANSETRON HCL 4 MG/2ML IJ SOLN
INTRAMUSCULAR | Status: AC
  Filled 2017-07-12: qty 2

## 2017-07-12 MED ORDER — LACTATED RINGERS IV SOLN
INTRAVENOUS | Status: DC
  Administered 2017-07-12: 09:00:00 via INTRAVENOUS

## 2017-07-12 MED ORDER — MIDAZOLAM HCL 2 MG/2ML IJ SOLN
INTRAMUSCULAR | Status: AC
  Filled 2017-07-12: qty 2

## 2017-07-12 MED ORDER — OXYCODONE-ACETAMINOPHEN 5-325 MG PO TABS: 1.0000 | ORAL_TABLET | Freq: Four times a day (QID) | ORAL | 0 refills | Status: DC | PRN

## 2017-07-12 MED ORDER — LIDOCAINE HCL (PF) 1 % IJ SOLN
INTRAMUSCULAR | Status: DC | PRN
  Administered 2017-07-12: 10 mL

## 2017-07-12 MED ORDER — CEFAZOLIN SODIUM-DEXTROSE 2-4 GM/100ML-% IV SOLN
INTRAVENOUS | Status: AC
  Filled 2017-07-12: qty 100

## 2017-07-12 MED ORDER — GLYCOPYRROLATE 0.2 MG/ML IJ SOLN
INTRAMUSCULAR | Status: AC
  Filled 2017-07-12: qty 1

## 2017-07-12 MED ORDER — ONDANSETRON HCL 4 MG/2ML IJ SOLN
4.0000 mg | Freq: Once | INTRAMUSCULAR | Status: AC
  Administered 2017-07-12: 4 mg via INTRAVENOUS

## 2017-07-12 MED ORDER — CEFAZOLIN SODIUM-DEXTROSE 2-4 GM/100ML-% IV SOLN
2.0000 g | INTRAVENOUS | Status: AC
  Administered 2017-07-12: 2 g via INTRAVENOUS

## 2017-07-12 MED ORDER — FENTANYL CITRATE (PF) 100 MCG/2ML IJ SOLN
25.0000 ug | Freq: Once | INTRAMUSCULAR | Status: AC
  Administered 2017-07-12: 25 ug via INTRAVENOUS

## 2017-07-12 MED ORDER — PROPOFOL 10 MG/ML IV BOLUS
INTRAVENOUS | Status: AC
  Filled 2017-07-12: qty 40

## 2017-07-12 MED ORDER — HEPARIN SOD (PORK) LOCK FLUSH 100 UNIT/ML IV SOLN
INTRAVENOUS | Status: DC | PRN
  Administered 2017-07-12: 500 [IU] via INTRAVENOUS

## 2017-07-12 MED ORDER — MIDAZOLAM HCL 2 MG/2ML IJ SOLN
1.0000 mg | INTRAMUSCULAR | Status: AC
  Administered 2017-07-12: 2 mg via INTRAVENOUS

## 2017-07-12 SURGICAL SUPPLY — 31 items
ADH SKN CLS APL DERMABOND .7 (GAUZE/BANDAGES/DRESSINGS) ×1
BAG DECANTER FOR FLEXI CONT (MISCELLANEOUS) ×3 IMPLANT
BAG HAMPER (MISCELLANEOUS) ×3 IMPLANT
CHLORAPREP W/TINT 10.5 ML (MISCELLANEOUS) ×3 IMPLANT
CLOTH BEACON ORANGE TIMEOUT ST (SAFETY) ×3 IMPLANT
COVER LIGHT HANDLE STERIS (MISCELLANEOUS) ×6 IMPLANT
DECANTER SPIKE VIAL GLASS SM (MISCELLANEOUS) ×3 IMPLANT
DERMABOND ADVANCED (GAUZE/BANDAGES/DRESSINGS) ×2
DERMABOND ADVANCED .7 DNX12 (GAUZE/BANDAGES/DRESSINGS) ×1 IMPLANT
DRAPE C-ARM FOLDED MOBILE STRL (DRAPES) ×3 IMPLANT
ELECT REM PT RETURN 9FT ADLT (ELECTROSURGICAL) ×3
ELECTRODE REM PT RTRN 9FT ADLT (ELECTROSURGICAL) ×1 IMPLANT
GLOVE BIOGEL PI IND STRL 7.0 (GLOVE) ×1 IMPLANT
GLOVE BIOGEL PI INDICATOR 7.0 (GLOVE) ×4
GLOVE SURG SS PI 7.5 STRL IVOR (GLOVE) ×3 IMPLANT
GOWN STRL REUS W/TWL LRG LVL3 (GOWN DISPOSABLE) ×4 IMPLANT
IV NS 500ML (IV SOLUTION) ×3
IV NS 500ML BAXH (IV SOLUTION) ×1 IMPLANT
KIT PORT POWER 8FR ISP MRI (Port) ×3 IMPLANT
KIT ROOM TURNOVER APOR (KITS) ×3 IMPLANT
MANIFOLD NEPTUNE II (INSTRUMENTS) ×3 IMPLANT
NDL HYPO 25X1 1.5 SAFETY (NEEDLE) ×1 IMPLANT
NEEDLE HYPO 25X1 1.5 SAFETY (NEEDLE) ×3 IMPLANT
PACK MINOR (CUSTOM PROCEDURE TRAY) ×3 IMPLANT
PAD ARMBOARD 7.5X6 YLW CONV (MISCELLANEOUS) ×3 IMPLANT
SET BASIN LINEN APH (SET/KITS/TRAYS/PACK) ×3 IMPLANT
SUT VIC AB 3-0 SH 27 (SUTURE) ×3
SUT VIC AB 3-0 SH 27X BRD (SUTURE) ×1 IMPLANT
SUT VIC AB 4-0 PS2 27 (SUTURE) ×3 IMPLANT
SYR 20CC LL (SYRINGE) ×3 IMPLANT
SYR CONTROL 10ML LL (SYRINGE) ×3 IMPLANT

## 2017-07-12 NOTE — Discharge Instructions (Signed)
Implanted Port Home Guide °An implanted port is a type of central line that is placed under the skin. Central lines are used to provide IV access when treatment or nutrition needs to be given through a person’s veins. Implanted ports are used for long-term IV access. An implanted port may be placed because: °· You need IV medicine that would be irritating to the small veins in your hands or arms. °· You need long-term IV medicines, such as antibiotics. °· You need IV nutrition for a long period. °· You need frequent blood draws for lab tests. °· You need dialysis. ° °Implanted ports are usually placed in the chest area, but they can also be placed in the upper arm, the abdomen, or the leg. An implanted port has two main parts: °· Reservoir. The reservoir is round and will appear as a small, raised area under your skin. The reservoir is the part where a needle is inserted to give medicines or draw blood. °· Catheter. The catheter is a thin, flexible tube that extends from the reservoir. The catheter is placed into a large vein. Medicine that is inserted into the reservoir goes into the catheter and then into the vein. ° °How will I care for my incision site? °Do not get the incision site wet. Bathe or shower as directed by your health care provider. °How is my port accessed? °Special steps must be taken to access the port: °· Before the port is accessed, a numbing cream can be placed on the skin. This helps numb the skin over the port site. °· Your health care provider uses a sterile technique to access the port. °? Your health care provider must put on a mask and sterile gloves. °? The skin over your port is cleaned carefully with an antiseptic and allowed to dry. °? The port is gently pinched between sterile gloves, and a needle is inserted into the port. °· Only "non-coring" port needles should be used to access the port. Once the port is accessed, a blood return should be checked. This helps ensure that the port  is in the vein and is not clogged. °· If your port needs to remain accessed for a constant infusion, a clear (transparent) bandage will be placed over the needle site. The bandage and needle will need to be changed every week, or as directed by your health care provider. °· Keep the bandage covering the needle clean and dry. Do not get it wet. Follow your health care provider’s instructions on how to take a shower or bath while the port is accessed. °· If your port does not need to stay accessed, no bandage is needed over the port. ° °What is flushing? °Flushing helps keep the port from getting clogged. Follow your health care provider’s instructions on how and when to flush the port. Ports are usually flushed with saline solution or a medicine called heparin. The need for flushing will depend on how the port is used. °· If the port is used for intermittent medicines or blood draws, the port will need to be flushed: °? After medicines have been given. °? After blood has been drawn. °? As part of routine maintenance. °· If a constant infusion is running, the port may not need to be flushed. ° °How long will my port stay implanted? °The port can stay in for as long as your health care provider thinks it is needed. When it is time for the port to come out, surgery will be   done to remove it. The procedure is similar to the one performed when the port was put in. °When should I seek immediate medical care? °When you have an implanted port, you should seek immediate medical care if: °· You notice a bad smell coming from the incision site. °· You have swelling, redness, or drainage at the incision site. °· You have more swelling or pain at the port site or the surrounding area. °· You have a fever that is not controlled with medicine. ° °This information is not intended to replace advice given to you by your health care provider. Make sure you discuss any questions you have with your health care provider. °Document  Released: 09/24/2005 Document Revised: 03/01/2016 Document Reviewed: 06/01/2013 °Elsevier Interactive Patient Education © 2017 [employer] °Implanted Port Insertion, Care After °This sheet gives you information about how to care for yourself after your procedure. Your health care provider may also give you more specific instructions. If you have problems or questions, contact your health care provider. °What can I expect after the procedure? °After your procedure, it is common to have: °· Discomfort at the port insertion site. °· Bruising on the skin over the port. This should improve over 3-4 days. ° °Follow these instructions at home: °Port care °· After your port is placed, you will get a manufacturer's information card. The card has information about your port. Keep this card with you at all times. °· Take care of the port as told by your health care provider. Ask your health care provider if you or a family member can get training for taking care of the port at home. A home health care nurse may also take care of the port. °· Make sure to remember what type of port you have. °Incision care °· Follow instructions from your health care provider about how to take care of your port insertion site. Make sure you: °? Wash your hands with soap and water before you change your bandage (dressing). If soap and water are not available, use hand sanitizer. °? Change your dressing as told by your health care provider. °? Leave stitches (sutures), skin glue, or adhesive strips in place. These skin closures may need to stay in place for 2 weeks or longer. If adhesive strip edges start to loosen and curl up, you may trim the loose edges. Do not remove adhesive strips completely unless your health care provider tells you to do that. °· Check your port insertion site every day for signs of infection. Check for: °? More redness, swelling, or pain. °? More fluid or blood. °? Warmth. °? Pus or a bad smell. °General  instructions °· Do not take baths, swim, or use a hot tub until your health care provider approves. °· Do not lift anything that is heavier than 10 lb (4.5 kg) for a week, or as told by your health care provider. °· Ask your health care provider when it is okay to: °? Return to work or school. °? Resume usual physical activities or sports. °· Do not drive for 24 hours if you were given a medicine to help you relax (sedative). °· Take over-the-counter and prescription medicines only as told by your health care provider. °· Wear a medical alert bracelet in case of an emergency. This will tell any health care providers that you have a port. °· Keep all follow-up visits as told by your health care provider. This is important. °Contact a health care provider if: °· You cannot   flush your port with saline as directed, or you cannot draw blood from the port. °· You have a fever or chills. °· You have more redness, swelling, or pain around your port insertion site. °· You have more fluid or blood coming from your port insertion site. °· Your port insertion site feels warm to the touch. °· You have pus or a bad smell coming from the port insertion site. °Get help right away if: °· You have chest pain or shortness of breath. °· You have bleeding from your port that you cannot control. °Summary °· Take care of the port as told by your health care provider. °· Change your dressing as told by your health care provider. °· Keep all follow-up visits as told by your health care provider. °This information is not intended to replace advice given to you by your health care provider. Make sure you discuss any questions you have with your health care provider. °Document Released: 07/15/2013 Document Revised: 08/15/2016 Document Reviewed: 08/15/2016 °Elsevier Interactive Patient Education © 2017 [employer] ° ° °PATIENT INSTRUCTIONS °POST-ANESTHESIA ° °IMMEDIATELY FOLLOWING SURGERY:  Do not drive or operate machinery for the first  twenty four hours after surgery.  Do not make any important decisions for twenty four hours after surgery or while taking narcotic pain medications or sedatives.  If you develop intractable nausea and vomiting or a severe headache please notify your doctor immediately. ° °FOLLOW-UP:  Please make an appointment with your surgeon as instructed. You do not need to follow up with anesthesia unless specifically instructed to do so. ° °WOUND CARE INSTRUCTIONS (if applicable):  Keep a dry clean dressing on the anesthesia/puncture wound site if there is drainage.  Once the wound has quit draining you may leave it open to air.  Generally you should leave the bandage intact for twenty four hours unless there is drainage.  If the epidural site drains for more than 36-48 hours please call the anesthesia department. ° °QUESTIONS?:  Please feel free to call your physician or the hospital operator if you have any questions, and they will be happy to assist you.    ° ° ° °

## 2017-07-12 NOTE — Anesthesia Preprocedure Evaluation (Signed)
Anesthesia Evaluation  Patient identified by MRN, date of birth, ID band Patient awake    Reviewed: Allergy & Precautions, NPO status , Patient's Chart, lab work & pertinent test results  Airway Mallampati: I  TM Distance: >3 FB Neck ROM: Full    Dental  (+) Poor Dentition, Chipped, Missing   Pulmonary    Pulmonary exam normal        Cardiovascular Exercise Tolerance: Good negative cardio ROS Normal cardiovascular exam Rhythm:Regular Rate:Normal     Neuro/Psych  Headaches, PSYCHIATRIC DISORDERS Anxiety Depression Bipolar Disorder    GI/Hepatic GERD  Medicated and Controlled,Mild liver dysfunction     Endo/Other  diabetes, Well Controlled, Type 1, Insulin DependentHx pancreatitis, hx whipple.    Renal/GU      Musculoskeletal   Abdominal Normal abdominal exam  (+)   Peds  Hematology   Anesthesia Other Findings   Reproductive/Obstetrics                             Anesthesia Physical Anesthesia Plan  ASA: IV  Anesthesia Plan: MAC   Post-op Pain Management:    Induction: Intravenous  PONV Risk Score and Plan:   Airway Management Planned: Simple Face Mask and Natural Airway  Additional Equipment:   Intra-op Plan:   Post-operative Plan:   Informed Consent: I have reviewed the patients History and Physical, chart, labs and discussed the procedure including the risks, benefits and alternatives for the proposed anesthesia with the patient or authorized representative who has indicated his/her understanding and acceptance.     Plan Discussed with: CRNA  Anesthesia Plan Comments:         Anesthesia Quick Evaluation

## 2017-07-12 NOTE — Transfer of Care (Signed)
Immediate Anesthesia Transfer of Care Note  Patient: [name] [name]  Procedure(s) Performed: INSERTION PORT-A-CATH (N/A Chest)  Patient Location: PACU  Anesthesia Type:MAC  Level of Consciousness: awake, alert  and oriented  Airway & Oxygen Therapy: Patient Spontanous Breathing and Patient connected to nasal cannula oxygen  Post-op Assessment: Report given to RN and Post -op Vital signs reviewed and stable  Post vital signs: Reviewed and stable  Last Vitals:  Vitals:   07/12/17 0810 07/12/17 0815  BP: 127/87 122/70  Resp: 18 (!) 25  Temp: 36.8 C   SpO2: 97% 97%    Last Pain: There were no vitals filed for this visit.       Complications: No apparent anesthesia complications

## 2017-07-12 NOTE — Anesthesia Postprocedure Evaluation (Signed)
Anesthesia Post Note  Patient: [name] [name]  Procedure(s) Performed: INSERTION PORT-A-CATH (N/A Chest)  Patient location during evaluation: PACU Anesthesia Type: MAC Level of consciousness: awake and alert and oriented Pain management: pain level controlled Vital Signs Assessment: post-procedure vital signs reviewed and stable Respiratory status: spontaneous breathing Cardiovascular status: blood pressure returned to baseline and stable Postop Assessment: no apparent nausea or vomiting and adequate PO intake Anesthetic complications: no     Last Vitals:  Vitals:   07/12/17 1000 07/12/17 1006  BP: 109/86 121/86  Pulse: 89 89  Resp: 10 (!) 8  Temp:    SpO2: 95% 94%    Last Pain:  Vitals:   07/12/17 1000  PainSc: 6                  Anyla Israelson

## 2017-07-12 NOTE — Interval H&P Note (Signed)
History and Physical Interval Note:  07/12/2017 8:16 AM  [name] [name]  has presented today for surgery, with the diagnosis of chronic pancreatitis  The various methods of treatment have been discussed with the patient and family. After consideration of risks, benefits and other options for treatment, the patient has consented to  Procedure(s): INSERTION PORT-A-CATH (N/A) as a surgical intervention .  The patient's history has been reviewed, patient examined, no change in status, stable for surgery.  I have reviewed the patient's chart and labs.  Questions were answered to the patient's satisfaction.     Franky Macho

## 2017-07-12 NOTE — Op Note (Signed)
Patient:  [name] [name]  DOB:  [birth year]  MRN:  [id number]   Preop Diagnosis:  Chronic pancreatitis  Postop Diagnosis:  same  Procedure:  Port-A-Cath insertion   Surgeon:  Aviva Signs, MD  Anes:  Mac  Indications:  Patient is a 46 year old white female with multiple medical problems including chronic pancreatitis who presents for Port-A-Cath insertion. The risks and benefits of the procedure including bleeding, infection, and pneumothorax were fully explained to the patient, who gave informed consent.  Procedure note:  The patient was placed in the Trendelenburg position after the left upper chest was prepped and draped using the usual sterile technique with DuraPrep. Surgical site confirmation was performed.  One percent Xylocaine was used for local anesthesia. An incision was made below left clavicle. A subcutaneous pocket was formed. The needles advanced into the left subclavian vein using the Seldinger technique without difficulty. A guidewire was then advanced into the right atrium under fluoroscopic guidance. An introducer and peel-away sheath were placed over the guidewire. After was then inserted through the peel-away sheath and the peel-away sheath was removed. The catheter was then attached to the port and the port placed in subcutaneous pocket. Adequate positioning was confirmed by fluoroscopy. Adequate blood flow was noted on aspiration of the port. The port was flushed with heparin flush. Subcutaneous layer was reapproximated using a 3-0 Vicryl interrupted suture. The skin was closed using a 4-0 Vicryl subcuticular suture. Dermabond was applied.  All tape and needle counts were correct at the end of the procedure. The patient was transferred to PACU in stable condition. A chest x-ray will be performed at that time.  Complications:  None   EBL:  minimal  Specimen:  none

## 2017-07-15 ENCOUNTER — Encounter (HOSPITAL_COMMUNITY): Payer: Self-pay | Admitting: General Surgery

## 2017-07-24 ENCOUNTER — Other Ambulatory Visit (INDEPENDENT_AMBULATORY_CARE_PROVIDER_SITE_OTHER): Payer: Self-pay | Admitting: Internal Medicine

## 2017-07-24 DIAGNOSIS — K861 Other chronic pancreatitis: Secondary | ICD-10-CM

## 2017-07-24 DIAGNOSIS — R1013 Epigastric pain: Secondary | ICD-10-CM

## 2017-07-30 ENCOUNTER — Ambulatory Visit (HOSPITAL_COMMUNITY): Admission: RE | Admit: 2017-07-30 | Payer: Medicare HMO | Source: Ambulatory Visit

## 2017-08-01 ENCOUNTER — Other Ambulatory Visit (INDEPENDENT_AMBULATORY_CARE_PROVIDER_SITE_OTHER): Payer: Self-pay | Admitting: Internal Medicine

## 2017-08-05 ENCOUNTER — Ambulatory Visit (HOSPITAL_COMMUNITY)
Admission: RE | Admit: 2017-08-05 | Discharge: 2017-08-05 | Disposition: A | Discharge status: Home / Self Care | Payer: Medicare HMO | Source: Ambulatory Visit | Attending: Internal Medicine | Admitting: Internal Medicine

## 2017-08-05 ENCOUNTER — Other Ambulatory Visit (INDEPENDENT_AMBULATORY_CARE_PROVIDER_SITE_OTHER): Payer: Self-pay | Admitting: Internal Medicine

## 2017-08-05 DIAGNOSIS — Z931 Gastrostomy status: Secondary | ICD-10-CM | POA: Diagnosis not present

## 2017-08-05 DIAGNOSIS — K861 Other chronic pancreatitis: Secondary | ICD-10-CM

## 2017-08-05 DIAGNOSIS — K760 Fatty (change of) liver, not elsewhere classified: Secondary | ICD-10-CM | POA: Insufficient documentation

## 2017-08-05 DIAGNOSIS — Z9889 Other specified postprocedural states: Secondary | ICD-10-CM | POA: Diagnosis not present

## 2017-08-05 DIAGNOSIS — R590 Localized enlarged lymph nodes: Secondary | ICD-10-CM | POA: Insufficient documentation

## 2017-08-05 DIAGNOSIS — R1013 Epigastric pain: Secondary | ICD-10-CM

## 2017-08-05 DIAGNOSIS — R932 Abnormal findings on diagnostic imaging of liver and biliary tract: Secondary | ICD-10-CM | POA: Diagnosis not present

## 2017-08-05 MED ORDER — GADOBENATE DIMEGLUMINE 529 MG/ML IV SOLN
15.0000 mL | Freq: Once | INTRAVENOUS | Status: AC | PRN
  Administered 2017-08-05: 14 mL via INTRAVENOUS

## 2017-08-05 MED ORDER — HEPARIN SOD (PORK) LOCK FLUSH 100 UNIT/ML IV SOLN
INTRAVENOUS | Status: AC
  Administered 2017-08-05: 500 [IU]
  Filled 2017-08-05: qty 5

## 2017-08-07 ENCOUNTER — Telehealth: Payer: Self-pay | Admitting: Family Medicine

## 2017-08-07 NOTE — Telephone Encounter (Signed)
Requesting Rx for pain meds.  She said that Dr. Karilyn Cotaehman is working on sending her to pain management.

## 2017-08-07 NOTE — Telephone Encounter (Signed)
Last seen 06/12/17

## 2017-08-08 NOTE — Telephone Encounter (Signed)
Pt called to check on this. 

## 2017-08-09 ENCOUNTER — Other Ambulatory Visit: Payer: Self-pay | Admitting: Nurse Practitioner

## 2017-08-09 MED ORDER — OXYCODONE-ACETAMINOPHEN 5-325 MG PO TABS: 1.0000 | ORAL_TABLET | Freq: Four times a day (QID) | ORAL | 0 refills | Status: DC | PRN

## 2017-08-09 NOTE — Telephone Encounter (Signed)
I refilled the RX written by Dr. Lovell SheehanJenkins earlier in October. Dr. Brett CanalesSteve will be back in the office next week if she needs further medication. Glad to hear she will be seeing pain management.

## 2017-08-09 NOTE — Telephone Encounter (Signed)
Discussed with pt. Pt notified script ready for pick up.

## 2017-08-09 NOTE — Telephone Encounter (Signed)
Patient called back.  I notified her that Dr. Brett CanalesSteve is out of the office.  She is hoping that Eber JonesCarolyn can answer this message.

## 2017-08-12 ENCOUNTER — Telehealth: Payer: Self-pay | Admitting: Family Medicine

## 2017-08-12 NOTE — Telephone Encounter (Signed)
Dr. Karilyn Cotaehman would like our office to set patient up for pain management.  Please advise.  Also, patient had a port put in about 3 weeks ago.  Patient wants to know if Dr. Brett CanalesSteve can set her appointment up to get it flushed.

## 2017-08-12 NOTE — Telephone Encounter (Signed)
Please advise.Thanks,CS 

## 2017-08-12 NOTE — Telephone Encounter (Signed)
Call pot needs oi v this wk for all of this

## 2017-08-12 NOTE — Telephone Encounter (Signed)
Spoke with patient and informed her per Dr.Steve Luking- needs office visit to discuss. Patient verbalized understanding.

## 2017-08-14 ENCOUNTER — Ambulatory Visit (INDEPENDENT_AMBULATORY_CARE_PROVIDER_SITE_OTHER): Payer: Medicare HMO | Admitting: Family Medicine

## 2017-08-14 ENCOUNTER — Encounter: Payer: Self-pay | Admitting: Family Medicine

## 2017-08-14 VITALS — BP 104/74 | Ht 63.0 in | Wt 144.4 lb

## 2017-08-14 DIAGNOSIS — R52 Pain, unspecified: Secondary | ICD-10-CM | POA: Diagnosis not present

## 2017-08-14 DIAGNOSIS — I81 Portal vein thrombosis: Secondary | ICD-10-CM

## 2017-08-14 MED ORDER — OXYCODONE-ACETAMINOPHEN 5-325 MG PO TABS
ORAL_TABLET | ORAL | 0 refills | Status: DC
Start: 2017-08-14 — End: 2018-07-23

## 2017-08-14 NOTE — Progress Notes (Signed)
   Subjective:    Patient ID: [name] Jakschrystal R [name], female    DOB: 1970-11-21, 46 y.o.   MRN: [id number] Patient presents with numerous concerns Abdominal Pain  This is a chronic problem. The current episode started more than 1 month ago. The pain is located in the RUQ, epigastric region and LUQ. The quality of the pain is sharp.    Intermittent abdominal pain.  Deep in the epigastrium.  It comes and goes.  At times.  At times crampy.  More severe than it used to be last year.  Patient concerned.  Her GI doctor recommended a pain specialist.  He sent her back to us to work on this  Patient notes needing 2 pain pills per day in order to function  Patient also got scan results.  GI doctor told her she had something on her pancreas she is worried about this.  Past review of  Patient also now has Port-A-Cath GI doctor.  Needs monthly flushing of this. Patient states no other concerns this visit.      Review of Systems  Gastrointestinal: Positive for abdominal pain.       Objective:   Physical Exam Alert and oriented, vitals reviewed and stable, NAD ENT-TM's and ext canals WNL bilat via otoscopic exam Soft palate, tonsils and post pharynx WNL via oropharyngeal exam Neck-symmetric, no masses; thyroid nonpalpable and nontender Pulmonary-no tachypnea or accessory muscle use; Clear without wheezes via auscultation Card--no abnrml murmurs, rhythm reg and rate WNL Carotid pulses symmetric, without bruits Abdomen moderate epigastric tenderness      Assessment & Plan:  Impression chronic pain.  Long discussion likely related to chronic pancreatitis.  Complicated by patient's history of anxiety and even suicidal attempt in the past.  She states she definitely feels no thoughts of self-harm or depression at this time.  She is interested in a chronic pain specialist to see if they can help  2.  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  Discussed with  patient.  GI to screen in 1 year  Greater than 50% of this 25 minute face to face visit was spent in counseling and discussion and coordination of care regarding the above diagnosis/diagnosies

## 2017-08-21 ENCOUNTER — Encounter (HOSPITAL_COMMUNITY)
Admission: RE | Admit: 2017-08-21 | Discharge: 2017-08-21 | Disposition: A | Discharge status: Home / Self Care | Payer: Medicare HMO | Source: Ambulatory Visit | Attending: General Surgery | Admitting: General Surgery

## 2017-08-21 ENCOUNTER — Encounter: Payer: Self-pay | Admitting: Family Medicine

## 2017-08-27 ENCOUNTER — Encounter: Payer: Self-pay | Admitting: Family Medicine

## 2017-09-03 DIAGNOSIS — R109 Unspecified abdominal pain: Secondary | ICD-10-CM | POA: Diagnosis not present

## 2017-09-03 DIAGNOSIS — Z79899 Other long term (current) drug therapy: Secondary | ICD-10-CM | POA: Diagnosis not present

## 2017-09-03 DIAGNOSIS — F319 Bipolar disorder, unspecified: Secondary | ICD-10-CM | POA: Diagnosis not present

## 2017-09-13 ENCOUNTER — Encounter (HOSPITAL_COMMUNITY)
Admission: RE | Admit: 2017-09-13 | Discharge: 2017-09-13 | Disposition: A | Discharge status: Home / Self Care | Payer: Medicare HMO | Source: Ambulatory Visit | Attending: Family Medicine | Admitting: Family Medicine

## 2017-09-13 DIAGNOSIS — I81 Portal vein thrombosis: Secondary | ICD-10-CM | POA: Insufficient documentation

## 2017-09-13 MED ORDER — HEPARIN SOD (PORK) LOCK FLUSH 100 UNIT/ML IV SOLN
500.0000 [IU] | INTRAVENOUS | Status: AC | PRN
  Administered 2017-09-13: 500 [IU]

## 2017-09-13 MED ORDER — SODIUM CHLORIDE 0.9% FLUSH: 10.0000 mL | INTRAVENOUS | Status: DC | PRN

## 2017-09-13 MED ORDER — HEPARIN SOD (PORK) LOCK FLUSH 100 UNIT/ML IV SOLN
INTRAVENOUS | Status: AC
  Filled 2017-09-13: qty 5

## 2017-09-13 NOTE — Progress Notes (Signed)
Portacath accessed easily and good blood return noted. Port flushed with heparin. Patient tolerated procedure well.

## 2017-10-03 ENCOUNTER — Other Ambulatory Visit: Payer: Self-pay | Admitting: Family Medicine

## 2017-10-10 DIAGNOSIS — E109 Type 1 diabetes mellitus without complications: Secondary | ICD-10-CM | POA: Diagnosis not present

## 2017-10-10 DIAGNOSIS — Z79899 Other long term (current) drug therapy: Secondary | ICD-10-CM | POA: Diagnosis not present

## 2017-10-10 DIAGNOSIS — F319 Bipolar disorder, unspecified: Secondary | ICD-10-CM | POA: Diagnosis not present

## 2017-10-10 DIAGNOSIS — E1065 Type 1 diabetes mellitus with hyperglycemia: Secondary | ICD-10-CM | POA: Diagnosis not present

## 2017-10-10 DIAGNOSIS — Z23 Encounter for immunization: Secondary | ICD-10-CM | POA: Diagnosis not present

## 2017-10-10 DIAGNOSIS — K8681 Exocrine pancreatic insufficiency: Secondary | ICD-10-CM | POA: Diagnosis not present

## 2017-10-10 DIAGNOSIS — G8929 Other chronic pain: Secondary | ICD-10-CM | POA: Diagnosis not present

## 2017-10-10 DIAGNOSIS — E119 Type 2 diabetes mellitus without complications: Secondary | ICD-10-CM | POA: Diagnosis not present

## 2017-10-10 DIAGNOSIS — R109 Unspecified abdominal pain: Secondary | ICD-10-CM | POA: Diagnosis not present

## 2017-10-10 DIAGNOSIS — E1140 Type 2 diabetes mellitus with diabetic neuropathy, unspecified: Secondary | ICD-10-CM | POA: Diagnosis not present

## 2017-10-22 ENCOUNTER — Other Ambulatory Visit: Payer: Self-pay | Admitting: Family Medicine

## 2017-10-24 DIAGNOSIS — G8929 Other chronic pain: Secondary | ICD-10-CM | POA: Diagnosis not present

## 2017-10-24 DIAGNOSIS — Z79899 Other long term (current) drug therapy: Secondary | ICD-10-CM | POA: Diagnosis not present

## 2017-10-24 DIAGNOSIS — R109 Unspecified abdominal pain: Secondary | ICD-10-CM | POA: Diagnosis not present

## 2017-10-24 DIAGNOSIS — F319 Bipolar disorder, unspecified: Secondary | ICD-10-CM | POA: Diagnosis not present

## 2017-10-25 ENCOUNTER — Encounter (HOSPITAL_COMMUNITY): Admission: RE | Admit: 2017-10-25 | Payer: Medicare HMO | Source: Ambulatory Visit

## 2017-10-28 ENCOUNTER — Encounter (HOSPITAL_COMMUNITY)
Admission: RE | Admit: 2017-10-28 | Discharge: 2017-10-28 | Disposition: A | Discharge status: Home / Self Care | Payer: Medicare HMO | Source: Ambulatory Visit | Attending: Family Medicine | Admitting: Family Medicine

## 2017-10-28 DIAGNOSIS — K859 Acute pancreatitis without necrosis or infection, unspecified: Secondary | ICD-10-CM | POA: Insufficient documentation

## 2017-10-28 DIAGNOSIS — I81 Portal vein thrombosis: Secondary | ICD-10-CM | POA: Insufficient documentation

## 2017-10-28 LAB — CBC WITH DIFFERENTIAL/PLATELET
Basophils Absolute: 0 K/uL (ref 0.0–0.1)
Basophils Relative: 0 %
Eosinophils Absolute: 0.1 K/uL (ref 0.0–0.7)
Eosinophils Relative: 1 %
HCT: 39.4 % (ref 36.0–46.0)
Hemoglobin: 12.9 g/dL (ref 12.0–15.0)
Lymphocytes Relative: 18 %
Lymphs Abs: 2 K/uL (ref 0.7–4.0)
MCH: 31.8 pg (ref 26.0–34.0)
MCHC: 32.7 g/dL (ref 30.0–36.0)
MCV: 97 fL (ref 78.0–100.0)
Monocytes Absolute: 0.5 K/uL (ref 0.1–1.0)
Monocytes Relative: 5 %
Neutro Abs: 8.4 K/uL — ABNORMAL HIGH (ref 1.7–7.7)
Neutrophils Relative %: 76 %
Platelets: 192 K/uL (ref 150–400)
RBC: 4.06 MIL/uL (ref 3.87–5.11)
RDW: 12.4 % (ref 11.5–15.5)
WBC: 11 K/uL — ABNORMAL HIGH (ref 4.0–10.5)

## 2017-10-28 MED ORDER — HEPARIN SOD (PORK) LOCK FLUSH 100 UNIT/ML IV SOLN
500.0000 [IU] | INTRAVENOUS | Status: AC | PRN
  Administered 2017-10-28: 500 [IU]

## 2017-10-28 MED ORDER — SODIUM CHLORIDE 0.9% FLUSH
10.0000 mL | INTRAVENOUS | Status: AC | PRN
  Administered 2017-10-28: 10 mL

## 2017-10-28 MED ORDER — HEPARIN SOD (PORK) LOCK FLUSH 100 UNIT/ML IV SOLN
INTRAVENOUS | Status: AC
  Filled 2017-10-28: qty 5

## 2017-10-28 NOTE — Progress Notes (Signed)
Results for Tyna JakschCOBB, Tracee R (MRN [id number]) as of 10/28/2017 13:40  Ref. Range 10/28/2017 10:59  WBC Latest Ref Range: 4.0 - 10.5 K/uL 11.0 (H)  RBC Latest Ref Range: 3.87 - 5.11 MIL/uL 4.06  Hemoglobin Latest Ref Range: 12.0 - 15.0 g/dL 40.912.9  HCT Latest Ref Range: 36.0 - 46.0 % 39.4  MCV Latest Ref Range: 78.0 - 100.0 fL 97.0  MCH Latest Ref Range: 26.0 - 34.0 pg 31.8  MCHC Latest Ref Range: 30.0 - 36.0 g/dL 81.132.7  RDW Latest Ref Range: 11.5 - 15.5 % 12.4  Platelets Latest Ref Range: 150 - 400 K/uL 192  Neutrophils Latest Units: % 76  Lymphocytes Latest Units: % 18  Monocytes Relative Latest Units: % 5  Eosinophil Latest Units: % 1  Basophil Latest Units: % 0  NEUT# Latest Ref Range: 1.7 - 7.7 K/uL 8.4 (H)  Lymphocyte # Latest Ref Range: 0.7 - 4.0 K/uL 2.0  Monocyte # Latest Ref Range: 0.1 - 1.0 K/uL 0.5  Eosinophils Absolute Latest Ref Range: 0.0 - 0.7 K/uL 0.1  Basophils Absolute Latest Ref Range: 0.0 - 0.1 K/uL 0.0

## 2017-10-28 NOTE — Progress Notes (Signed)
Pt has Rx for CBC to be drawn. Blood drawn from port before flush and sent to lab for results.

## 2017-11-01 ENCOUNTER — Other Ambulatory Visit: Payer: Self-pay | Admitting: Family Medicine

## 2017-11-01 NOTE — Telephone Encounter (Signed)
Ok six mo 

## 2017-11-08 ENCOUNTER — Telehealth: Payer: Self-pay

## 2017-11-08 MED ORDER — PROMETHAZINE HCL 50 MG PO TABS: ORAL_TABLET | ORAL | 2 refills | Status: DC

## 2017-11-08 NOTE — Telephone Encounter (Signed)
Ok plus 2 ref 

## 2017-11-08 NOTE — Telephone Encounter (Signed)
Prescription sent electronically to the pharmacy.

## 2017-11-08 NOTE — Telephone Encounter (Signed)
Per [facility] pt is requesting Promethazine 50 mg one po Q 6 hours prn for N& V # 30  . Last seen in 08/2017 by Dr.Steve for Thrombosis.

## 2017-11-08 NOTE — Addendum Note (Signed)
Addended by: Margaretha SheffieldBROWN, Keilana Morlock S on: 11/08/2017 05:26 PM   Modules accepted: Orders

## 2017-11-15 ENCOUNTER — Other Ambulatory Visit: Payer: Self-pay | Admitting: Family Medicine

## 2017-11-18 ENCOUNTER — Ambulatory Visit (INDEPENDENT_AMBULATORY_CARE_PROVIDER_SITE_OTHER): Payer: Medicare HMO | Admitting: Family Medicine

## 2017-11-18 ENCOUNTER — Encounter: Payer: Self-pay | Admitting: Family Medicine

## 2017-11-18 VITALS — BP 128/84 | Temp 99.3°F | Ht 63.0 in | Wt 142.0 lb

## 2017-11-18 DIAGNOSIS — J111 Influenza due to unidentified influenza virus with other respiratory manifestations: Secondary | ICD-10-CM

## 2017-11-18 MED ORDER — TRIAMCINOLONE ACETONIDE 0.5 % EX OINT: 1.0000 "application " | TOPICAL_OINTMENT | Freq: Two times a day (BID) | CUTANEOUS | 2 refills | Status: DC

## 2017-11-18 MED ORDER — OSELTAMIVIR PHOSPHATE 75 MG PO CAPS: 75.0000 mg | ORAL_CAPSULE | Freq: Two times a day (BID) | ORAL | 0 refills | Status: AC

## 2017-11-18 NOTE — Progress Notes (Signed)
   Subjective:    Patient ID: [name] [name], female    DOB: 16-Jun-1971, 47 y.o.   MRN: [id number]  Sinusitis  This is a new problem. The current episode started yesterday. Associated symptoms include ear pain, headaches and a sore throat. (Fever) Treatments tried: dayquil,    Sudden onset of headache and fever  Some cong and dranage   achey over all   Aching all over  ibu prn     Review of Systems  HENT: Positive for ear pain and sore throat.   Neurological: Positive for headaches.       Objective:   Physical Exam  Alert vitals reviewed, moderate malaise. Hydration good. Positive nasal congestion lungs no crackles or wheezes, no tachypnea, intermittent bronchial cough during exam heart regular rate and rhythm.       Assessment & Plan:  Impression influenza discussed at length. Ashby Dawesature of illness and potential sequela discussed. Plan Tamiflu prescribed if indicated and timing appropriate. Symptom care discussed. Warning signs discussed. WSL

## 2017-11-22 DIAGNOSIS — Z79899 Other long term (current) drug therapy: Secondary | ICD-10-CM | POA: Diagnosis not present

## 2017-11-22 DIAGNOSIS — G8929 Other chronic pain: Secondary | ICD-10-CM | POA: Diagnosis not present

## 2017-11-22 DIAGNOSIS — F319 Bipolar disorder, unspecified: Secondary | ICD-10-CM | POA: Diagnosis not present

## 2017-11-22 DIAGNOSIS — R109 Unspecified abdominal pain: Secondary | ICD-10-CM | POA: Diagnosis not present

## 2017-11-23 ENCOUNTER — Other Ambulatory Visit: Payer: Self-pay | Admitting: Family Medicine

## 2017-11-27 ENCOUNTER — Other Ambulatory Visit: Payer: Self-pay | Admitting: Family Medicine

## 2017-11-27 ENCOUNTER — Other Ambulatory Visit (INDEPENDENT_AMBULATORY_CARE_PROVIDER_SITE_OTHER): Payer: Self-pay | Admitting: Internal Medicine

## 2017-12-09 ENCOUNTER — Encounter (HOSPITAL_COMMUNITY)
Admission: RE | Admit: 2017-12-09 | Discharge: 2017-12-09 | Disposition: A | Discharge status: Home / Self Care | Payer: Medicare HMO | Source: Ambulatory Visit | Attending: Family Medicine | Admitting: Family Medicine

## 2017-12-17 ENCOUNTER — Other Ambulatory Visit: Payer: Self-pay | Admitting: Family Medicine

## 2017-12-23 DIAGNOSIS — F319 Bipolar disorder, unspecified: Secondary | ICD-10-CM | POA: Diagnosis not present

## 2017-12-23 DIAGNOSIS — Z79899 Other long term (current) drug therapy: Secondary | ICD-10-CM | POA: Diagnosis not present

## 2017-12-23 DIAGNOSIS — G8929 Other chronic pain: Secondary | ICD-10-CM | POA: Diagnosis not present

## 2017-12-23 DIAGNOSIS — R109 Unspecified abdominal pain: Secondary | ICD-10-CM | POA: Diagnosis not present

## 2018-01-01 ENCOUNTER — Other Ambulatory Visit: Payer: Self-pay | Admitting: Family Medicine

## 2018-01-21 ENCOUNTER — Other Ambulatory Visit: Payer: Self-pay | Admitting: Family Medicine

## 2018-01-30 DIAGNOSIS — R109 Unspecified abdominal pain: Secondary | ICD-10-CM | POA: Diagnosis not present

## 2018-01-30 DIAGNOSIS — G8929 Other chronic pain: Secondary | ICD-10-CM | POA: Diagnosis not present

## 2018-01-30 DIAGNOSIS — K859 Acute pancreatitis without necrosis or infection, unspecified: Secondary | ICD-10-CM | POA: Diagnosis not present

## 2018-01-30 DIAGNOSIS — Z79899 Other long term (current) drug therapy: Secondary | ICD-10-CM | POA: Diagnosis not present

## 2018-02-07 ENCOUNTER — Other Ambulatory Visit: Payer: Self-pay | Admitting: Family Medicine

## 2018-02-19 ENCOUNTER — Other Ambulatory Visit: Payer: Self-pay | Admitting: Family Medicine

## 2018-02-26 ENCOUNTER — Telehealth (INDEPENDENT_AMBULATORY_CARE_PROVIDER_SITE_OTHER): Payer: Self-pay | Admitting: Internal Medicine

## 2018-02-26 ENCOUNTER — Other Ambulatory Visit: Payer: Self-pay | Admitting: Family Medicine

## 2018-02-26 ENCOUNTER — Telehealth: Payer: Self-pay

## 2018-02-26 NOTE — Telephone Encounter (Signed)
Patient called stating she is having severe abdominal pain no fevers,no vomiting ,no diarrhea,but is nauseous woke up this am this way. She states she has tried calling Dr.Rahmans office and they will not call her back she states she called her pain management Dr. And they advised that she call our office. We do not have any available appointment for today advised that she go to ed or urgent care since it was Severe.

## 2018-02-26 NOTE — Telephone Encounter (Signed)
Definitely with her hx I agree

## 2018-02-26 NOTE — Telephone Encounter (Signed)
Patient left message stating she has been going to pain management and is taking oxycodone 3 times a day is has no relief - please call [phone]

## 2018-02-26 NOTE — Telephone Encounter (Signed)
Per Dr.Rehman the patient will need to discuss this with her pain management physician.

## 2018-02-28 ENCOUNTER — Other Ambulatory Visit: Payer: Self-pay

## 2018-02-28 ENCOUNTER — Observation Stay (HOSPITAL_COMMUNITY)
Admission: EM | Admit: 2018-02-28 | Discharge: 2018-03-02 | Disposition: A | Discharge status: Home / Self Care | Payer: Medicare HMO | Attending: Internal Medicine | Admitting: Internal Medicine

## 2018-02-28 ENCOUNTER — Encounter (HOSPITAL_COMMUNITY): Payer: Self-pay

## 2018-02-28 DIAGNOSIS — Z794 Long term (current) use of insulin: Secondary | ICD-10-CM | POA: Diagnosis not present

## 2018-02-28 DIAGNOSIS — R109 Unspecified abdominal pain: Secondary | ICD-10-CM | POA: Diagnosis present

## 2018-02-28 DIAGNOSIS — E119 Type 2 diabetes mellitus without complications: Secondary | ICD-10-CM

## 2018-02-28 DIAGNOSIS — R1013 Epigastric pain: Secondary | ICD-10-CM | POA: Diagnosis not present

## 2018-02-28 DIAGNOSIS — K861 Other chronic pancreatitis: Secondary | ICD-10-CM | POA: Diagnosis present

## 2018-02-28 DIAGNOSIS — R1011 Right upper quadrant pain: Secondary | ICD-10-CM | POA: Diagnosis not present

## 2018-02-28 DIAGNOSIS — F319 Bipolar disorder, unspecified: Secondary | ICD-10-CM | POA: Diagnosis present

## 2018-02-28 DIAGNOSIS — Z87442 Personal history of urinary calculi: Secondary | ICD-10-CM | POA: Diagnosis present

## 2018-02-28 DIAGNOSIS — Z79899 Other long term (current) drug therapy: Secondary | ICD-10-CM | POA: Insufficient documentation

## 2018-02-28 DIAGNOSIS — G8929 Other chronic pain: Secondary | ICD-10-CM | POA: Diagnosis present

## 2018-02-28 LAB — COMPREHENSIVE METABOLIC PANEL
ALBUMIN: 3.1 g/dL — AB (ref 3.5–5.0)
ALT: 40 U/L (ref 14–54)
ANION GAP: 7 (ref 5–15)
AST: 36 U/L (ref 15–41)
Alkaline Phosphatase: 118 U/L (ref 38–126)
BUN: 16 mg/dL (ref 6–20)
CHLORIDE: 103 mmol/L (ref 101–111)
CO2: 24 mmol/L (ref 22–32)
Calcium: 8.8 mg/dL — ABNORMAL LOW (ref 8.9–10.3)
Creatinine, Ser: 0.5 mg/dL (ref 0.44–1.00)
GFR calc Af Amer: >60 mL/min (ref >60)
GFR calc non Af Amer: >60 mL/min (ref >60)
GLUCOSE: 283 mg/dL — AB (ref 65–99)
POTASSIUM: 3.8 mmol/L (ref 3.5–5.1)
SODIUM: 134 mmol/L — AB (ref 135–145)
TOTAL PROTEIN: 6.8 g/dL (ref 6.5–8.1)
Total Bilirubin: 0.7 mg/dL (ref 0.3–1.2)

## 2018-02-28 LAB — CBC WITH DIFFERENTIAL/PLATELET
Basophils Absolute: 0 10*3/uL (ref 0.0–0.1)
Basophils Relative: 0 %
EOS ABS: 0.2 10*3/uL (ref 0.0–0.7)
Eosinophils Relative: 2 %
HEMATOCRIT: 38.1 % (ref 36.0–46.0)
HEMOGLOBIN: 12.9 g/dL (ref 12.0–15.0)
LYMPHS ABS: 2.9 10*3/uL (ref 0.7–4.0)
Lymphocytes Relative: 44 %
MCH: 31.8 pg (ref 26.0–34.0)
MCHC: 33.9 g/dL (ref 30.0–36.0)
MCV: 93.8 fL (ref 78.0–100.0)
MONOS PCT: 8 %
Monocytes Absolute: 0.5 10*3/uL (ref 0.1–1.0)
NEUTROS ABS: 3 10*3/uL (ref 1.7–7.7)
Neutrophils Relative %: 46 %
Platelets: 165 10*3/uL (ref 150–400)
RBC: 4.06 MIL/uL (ref 3.87–5.11)
RDW: 13.1 % (ref 11.5–15.5)
WBC: 6.6 10*3/uL (ref 4.0–10.5)

## 2018-02-28 LAB — GLUCOSE, CAPILLARY: GLUCOSE-CAPILLARY: 351 mg/dL — AB (ref 65–99)

## 2018-02-28 LAB — LIPASE, BLOOD: Lipase: 16 U/L (ref 11–51)

## 2018-02-28 MED ORDER — INSULIN ASPART 100 UNIT/ML ~~LOC~~ SOLN
4.0000 [IU] | Freq: Three times a day (TID) | SUBCUTANEOUS | Status: DC
  Administered 2018-03-01 (×2): 4 [IU] via SUBCUTANEOUS

## 2018-02-28 MED ORDER — INSULIN GLARGINE 100 UNIT/ML ~~LOC~~ SOLN
40.0000 [IU] | Freq: Every day | SUBCUTANEOUS | Status: DC
  Administered 2018-02-28 – 2018-03-01 (×2): 40 [IU] via SUBCUTANEOUS
  Filled 2018-02-28 (×3): qty 0.4

## 2018-02-28 MED ORDER — ONDANSETRON HCL 4 MG/2ML IJ SOLN
4.0000 mg | Freq: Once | INTRAMUSCULAR | Status: AC
  Administered 2018-02-28: 4 mg via INTRAVENOUS
  Filled 2018-02-28: qty 2

## 2018-02-28 MED ORDER — INSULIN ASPART 100 UNIT/ML ~~LOC~~ SOLN
0.0000 [IU] | Freq: Three times a day (TID) | SUBCUTANEOUS | Status: DC
  Administered 2018-03-01 (×2): 5 [IU] via SUBCUTANEOUS
  Administered 2018-03-02: 2 [IU] via SUBCUTANEOUS

## 2018-02-28 MED ORDER — ONDANSETRON HCL 4 MG/2ML IJ SOLN
4.0000 mg | Freq: Four times a day (QID) | INTRAMUSCULAR | Status: DC | PRN
Start: 2018-02-28 — End: 2018-03-02
  Administered 2018-02-28 – 2018-03-02 (×5): 4 mg via INTRAVENOUS
  Filled 2018-02-28 (×5): qty 2

## 2018-02-28 MED ORDER — MORPHINE SULFATE (PF) 4 MG/ML IV SOLN
4.0000 mg | INTRAVENOUS | Status: DC | PRN
  Administered 2018-02-28 – 2018-03-02 (×11): 4 mg via INTRAVENOUS
  Filled 2018-02-28 (×11): qty 1

## 2018-02-28 MED ORDER — MORPHINE SULFATE (PF) 4 MG/ML IV SOLN
4.0000 mg | INTRAVENOUS | Status: AC | PRN
  Administered 2018-02-28 (×4): 4 mg via INTRAVENOUS
  Filled 2018-02-28 (×4): qty 1

## 2018-02-28 MED ORDER — PROMETHAZINE HCL 25 MG/ML IJ SOLN
25.0000 mg | Freq: Once | INTRAMUSCULAR | Status: AC
  Administered 2018-02-28: 25 mg via INTRAVENOUS
  Filled 2018-02-28: qty 1

## 2018-02-28 MED ORDER — OXYCODONE HCL 5 MG PO TABS
10.0000 mg | ORAL_TABLET | ORAL | Status: DC | PRN
  Administered 2018-02-28 – 2018-03-02 (×8): 10 mg via ORAL
  Filled 2018-02-28 (×8): qty 2

## 2018-02-28 MED ORDER — SODIUM CHLORIDE 0.9 % IV BOLUS
1000.0000 mL | Freq: Once | INTRAVENOUS | Status: AC
  Administered 2018-02-28: 1000 mL via INTRAVENOUS

## 2018-02-28 NOTE — Care Management Obs Status (Signed)
MEDICARE OBSERVATION STATUS NOTIFICATION   Patient Details  Name: [name] [name] MRN: [id number] Date of Birth: 12-06-1970   Medicare Observation Status Notification Given:  Yes    Ellora Varnum, Chrystine Oiler, RN 02/28/2018, 11:13 AM

## 2018-02-28 NOTE — ED Triage Notes (Signed)
Pt c/o abd pain with nausea, no vomiting or diarrhea.  History of whipple procdure in 2011

## 2018-02-28 NOTE — ED Provider Notes (Signed)
[facility] EMERGENCY DEPARTMENT Provider Note   CSN: [id number] Arrival date & time: 02/28/18  0446     History   Chief Complaint Chief Complaint  Patient presents with  . Abdominal Pain    HPI Legacie R Waldren is a 47 y.o. female.  The history is provided by the patient.  She has a history of diabetes and is status post Whipple procedure and comes in with right upper quadrant pain for the last 3 days.  Pain is sharp and severe and radiates to the back.  There is associated nausea with no vomiting.  Pain is worse after eating.  Nothing makes it better.  She has been having normal bowel movements, and bowel movements do not affect the pain.  She has been taking oxycodone for pain without relief.  Pain is rated at 8/10.  Pain is similar to prior episodes of pancreatitis.  Of note, she has a nondrinker and is status post cholecystectomy.  No known history of hyperlipidemia.  Past Medical History:  Diagnosis Date  . Acute bacterial bronchitis    dx 12-06-2014  --  FINISHED Z-PAC-  NO FEVER BUT STILL INTERMITTANT  PRODUCTIVE COUGH  . Anxiety   . Chronic pain    has pain specialist  . GERD (gastroesophageal reflux disease)   . History of chronic gastritis   . History of chronic pancreatitis    multple recurrent--  s/p  whipple 2011  . History of esophageal ulcer   . History of kidney stones   . History of subarachnoid hemorrhage    01-08-2013--  fell on concrete--  resolved without surgical intervention  . History of suicide attempt    hx multiple attempts by overdosing of meds  . IBS (irritable bowel syndrome)   . Left ureteral calculus   . Major depression, recurrent, chronic (HCC)    w/  Hx  multiple suicide attempts (overdose)  . Migraine headache   . Type 2 diabetes mellitus, uncontrolled (HCC)   . Whipple disease 2011    Patient Active Problem List   Diagnosis Date Noted  . Epigastric pain 05/15/2017  . Abdominal pain, chronic, epigastric 05/09/2017  . Chronic  pancreatitis (HCC) 08/19/2016  . Ureteral calculus 12/20/2014  . Insomnia 06/28/2014  . DM type 2 (diabetes mellitus, type 2) (HCC) 08/13/2013  . Urinary retention 08/13/2013  . Hypotension 05/20/2013  . Ileus (HCC) 05/19/2013  . Abdominal pain 05/19/2013  . Hyponatremia 05/19/2013  . Hyperglycemia 05/19/2013  . Elevated transaminase level 05/19/2013  . Diabetes 01/22/2013  . Subarachnoid bleed (HCC) 01/09/2013  . Fall involving sidewalk curb 01/09/2013  . Contusion 01/09/2013  . Pain management 12/25/2012  . Portal vein thrombosis 05/13/2012  . Lesion of liver 05/13/2012  . Constipation 05/13/2012  . BIPOLAR DISORDER UNSPECIFIED 02/03/2009  . Migraine 02/03/2009  . History of digestive system disease 02/03/2009  . Esophageal reflux 02/03/2009  . RENAL CALCULUS, HX OF 02/03/2009  . ANKLE SPRAIN 11/29/2008    Past Surgical History:  Procedure Laterality Date  . CESAREAN SECTION  x2  last one 02-29-2000   last one w/ BILATERAL TUBAL LIGATION  . COLONOSCOPY  03-14-2009   dx ileocolonscopy  . CYSTOSCOPY WITH RETROGRADE PYELOGRAM, URETEROSCOPY AND STENT PLACEMENT Left 12/20/2014   Procedure: CYSTOSCOPY, LEFT URETEROSCOPY, STONE REMOVAL, LASER LITHOTRIPSY;  Surgeon: Barron Alvine, MD;  Location: [facility];  Service: Urology;  Laterality: Left;  . ESOPHAGOGASTRODUODENOSCOPY  last  one 12-31-2008  . ESOPHAGOGASTRODUODENOSCOPY (EGD) WITH PROPOFOL N/A 05/28/2017  Procedure: ESOPHAGOGASTRODUODENOSCOPY (EGD) WITH PROPOFOL;  Surgeon: Malissa Hippo, MD;  [facility];  Service: Endoscopy;  Laterality: N/A;  2:00  . HOLMIUM LASER APPLICATION Left 12/20/2014   Procedure: HOLMIUM LASER APPLICATION;  Surgeon: Barron Alvine, MD;  Location: [facility];  Service: Urology;  Laterality: Left;  . LAPAROSCOPIC ASSISTED VAGINAL HYSTERECTOMY  12-16-2006  . LAPAROSCOPIC NISSEN FUNDOPLICATION  Nov 2001   w/  CHOLECYSTECTOMY  . PORTACATH PLACEMENT N/A 07/12/2017    Procedure: INSERTION PORT-A-CATH;  Surgeon: Franky Macho, MD;  Location: [facility];  Service: General;  Laterality: N/A;  . REPAIR PERFORATED VISCUS  POST WHIPPLE  x5    last one 06-23-2012  . TUBAL LIGATION    . WHIPPLE PROCEDURE  2011   at [facility] History    Gravida      Para      Term      Preterm      AB      Living  2     SAB      TAB      Ectopic      Multiple      Live Births               Home Medications    Prior to Admission medications   Medication Sig Start Date End Date Taking? Authorizing Provider  Insulin Glargine (LANTUS SOLOSTAR) 100 UNIT/ML Solostar Pen Inject 40 Units into the skin at bedtime.     [provider]  insulin lispro (HUMALOG KWIKPEN) 100 UNIT/ML KiwkPen Inject 10-25 Units into the skin 4 (four) times daily -  with meals and at bedtime. Per sliding scale    [provider]  nortriptyline (PAMELOR) 25 MG capsule TAKE 4 CAPSULES BY MOUTH AT BEDTIME. 02/20/18   Merlyn Albert, MD  ondansetron (ZOFRAN ODT) 4 MG disintegrating tablet Take 1 tablet (4 mg total) by mouth every 8 (eight) hours as needed for nausea or vomiting. 04/29/17   Long, Arlyss Repress, MD  oxyCODONE-acetaminophen (ROXICET) 5-325 MG tablet Take 1 tablet by mouth twice daily as needed for pain 08/14/17   Merlyn Albert, MD  pantoprazole (PROTONIX) 40 MG tablet TAKE ONE TABLET BY MOUTH ONCE DAILY. 11/27/17   Setzer, Brand Males, NP  promethazine (PHENERGAN) 50 MG tablet TAKE 1 TABLET BY MOUTH EVERY 6 HOURS AS NEEDED FOR NAUSEA OR VOMITING. 11/08/17   Merlyn Albert, MD  promethazine (PHENERGAN) 50 MG tablet TAKE 1 TABLET BY MOUTH EVERY 6 HOURS AS NEEDED FOR NAUSEA OR VOMITING. 11/25/17   Merlyn Albert, MD  promethazine (PHENERGAN) 50 MG tablet TAKE 1 TABLET BY MOUTH EVERY 6 HOURS AS NEEDED FOR NAUSEA OR VOMITING. 11/27/17   Merlyn Albert, MD  promethazine (PHENERGAN) 50 MG tablet TAKE 1 TABLET BY MOUTH EVERY 6 HOURS AS NEEDED FOR NAUSEA OR VOMITING.  01/01/18   Merlyn Albert, MD  promethazine (PHENERGAN) 50 MG tablet TAKE 1 TABLET BY MOUTH EVERY 6 HOURS AS NEEDED FOR NAUSEA OR VOMITING. 01/21/18   Merlyn Albert, MD  promethazine (PHENERGAN) 50 MG tablet TAKE 1 TABLET BY MOUTH EVERY 6 HOURS AS NEEDED FOR NAUSEA OR VOMITING. 02/09/18   Merlyn Albert, MD  promethazine (PHENERGAN) 50 MG tablet TAKE 1 TABLET BY MOUTH EVERY 6 HOURS AS NEEDED FOR NAUSEA OR VOMITING. 02/26/18   Merlyn Albert, MD  topiramate (TOPAMAX) 25 MG tablet TAKE 1 TABLET EVERY MORNING AND 2 TABLETS AT  BEDTIME. Patient taking differently: TAKE 25 MG EVERY MORNING AND 50 MG AT BEDTIME. 05/21/17   Merlyn Albert, MD  triamcinolone ointment (KENALOG) 0.5 % Apply 1 application topically 2 (two) times daily. 11/18/17   Merlyn Albert, MD  zolpidem (AMBIEN) 10 MG tablet TAKE ONE TABLET BY MOUTH AT BEDTIME AS NEEDED FOR SLEEP. 11/01/17   Merlyn Albert, MD    Family History Family History  Problem Relation Age of Onset  . Diabetes Father     Social History Social History   Tobacco Use  . Smoking status: Never Smoker  . Smokeless tobacco: Never Used  Substance Use Topics  . Alcohol use: No  . Drug use: No     Allergies   Buspar [buspirone] and Metoclopramide hcl   Review of Systems Review of Systems  All other systems reviewed and are negative.    Physical Exam Updated Vital Signs BP 124/63   Pulse 97   Temp 98 F (36.7 C) (Oral)   Resp 18   Ht  (1.6 m)   Wt 63.5 kg (140 lb)   SpO2 100%   BMI 24.80 kg/m   Physical Exam  Nursing note and vitals reviewed.  47 year old female, resting comfortably and in no acute distress. Vital signs are normal. Oxygen saturation is 100%, which is normal. Head is normocephalic and atraumatic. PERRLA, EOMI. Oropharynx is clear. Neck is nontender and supple without adenopathy or JVD. Back is nontender and there is no CVA tenderness. Lungs are clear without rales, wheezes, or rhonchi. Chest is  nontender. Mediport present on the left side. Heart has regular rate and rhythm without murmur. Abdomen is soft, flat, with moderate tenderness in the epigastric area and right upper quadrant.  There is no rebound or guarding.  There are no masses or hepatosplenomegaly and peristalsis is hypoactive. Extremities have no cyanosis or edema, full range of motion is present. Skin is warm and dry without rash. Neurologic: Mental status is normal, cranial nerves are intact, there are no motor or sensory deficits.  ED Treatments / Results  Labs (all labs ordered are listed, but only abnormal results are displayed) Labs Reviewed  COMPREHENSIVE METABOLIC PANEL - Abnormal; Notable for the following components:      Result Value   Sodium 134 (*)    Glucose, Bld 283 (*)    Calcium 8.8 (*)    Albumin 3.1 (*)    All other components within normal limits  LIPASE, BLOOD  CBC WITH DIFFERENTIAL/PLATELET   Procedures Procedures   Medications Ordered in ED Medications  promethazine (PHENERGAN) injection 25 mg (has no administration in time range)  sodium chloride 0.9 % bolus 1,000 mL (1,000 mLs Intravenous New Bag/Given 02/28/18 0524)  morphine 4 MG/ML injection 4 mg (4 mg Intravenous Given 02/28/18 0633)  ondansetron (ZOFRAN) injection 4 mg (4 mg Intravenous Given 02/28/18 0524)     Initial Impression / Assessment and Plan / ED Course  I have reviewed the triage vital signs and the nursing notes.  Pertinent labs & imaging results that were available during my care of the patient were reviewed by me and considered in my medical decision making (see chart for details).  Abdominal pain consistent with pancreatitis.  Doubt peptic ulcer disease, diverticulitis, hepatitis.  Old records are reviewed, and she has multiple ED visits and hospitalizations for abdominal pain, with most recent visit 6 months ago.  Will check screening labs and start on IV fluids and give morphine for pain.  On reviewing radiology  records, she has 20 prior CT scans of abdomen and pelvis.  I do not see indication for CT scan at this point.  Lab work is unremarkable.  Lipase is normal-even in the lower range of normal.  However, given that she had a significant portion of her pancreas removed, and also considering that she has reported to have had multiple episodes of pancreatitis, low lipase does not necessarily rule out pancreatitis.  She has received 3 doses of morphine and continues to have significant pain.  Will admit for pain control.  Case is discussed with Dr. Sherryll Burger of [facility], who agrees to admit the patient.  Final Clinical Impressions(s) / ED Diagnoses   Final diagnoses:  RUQ pain    ED Discharge Orders    None       Dione Booze, MD 02/28/18 [phone]

## 2018-02-28 NOTE — H&P (Signed)
History and Physical    [name] [name] ZOX:[id number] DOB: [birth year] DOA: 02/28/2018  PCP: Merlyn Albert, MD   Patient coming from: home   Chief Complaint: abdominal pain  HPI: [name] [name] is a 47 y.o. female with medical history significant of type 2 diabetes on insulin, recurrent chronic pancreatitis status post Whipple procedure in 2011 at [facility], chronic abdominal pain, nephrolithiasis, psychiatric illness who comes in with abdominal pain.  Patient reports for the past several days she has had sharp epigastric pain that is burning.  The pain radiates to her back.  She has not had any emesis but she has had nausea.  She does not have any diarrhea or constipation.  The pain does not wax or wane.  Eating makes the pain worse.  There are no alleviating factors.  She has tried oral oxycodone without relief of the pain.  She awoke this a.m. with severe pain and presented to the emergency department for further evaluation.  She denies any fevers, chest pain, shortness of breath, lower extremity edema, orthopnea, paroxysmal nocturnal dyspnea, alcohol use, syncope/presyncope, cough, congestion, runny nose.  ED Course: In the ED patient's vitals were stable other than mild tachycardia.  Labs were reassuring except for mild hyponatremia and hyperglycemia.  CBC was normal.  Lipase was normal.  No imaging was done as patient has had multiple multiple multiple CTs due to her numerous visits for this chronic abdominal pain.  Review of Systems: As per HPI otherwise 10 point review of systems negative.    Past Medical History:  Diagnosis Date  . Acute bacterial bronchitis    dx 12-06-2014  --  FINISHED Z-PAC-  NO FEVER BUT STILL INTERMITTANT  PRODUCTIVE COUGH  . Anxiety   . Chronic pain    has pain specialist  . GERD (gastroesophageal reflux disease)   . History of chronic gastritis   . History of chronic pancreatitis    multple recurrent--  s/p  whipple 2011  . History of esophageal ulcer   .  History of kidney stones   . History of subarachnoid hemorrhage    01-08-2013--  fell on concrete--  resolved without surgical intervention  . History of suicide attempt    hx multiple attempts by overdosing of meds  . IBS (irritable bowel syndrome)   . Left ureteral calculus   . Major depression, recurrent, chronic (HCC)    w/  Hx  multiple suicide attempts (overdose)  . Migraine headache   . Type 2 diabetes mellitus, uncontrolled (HCC)   . Whipple disease 2011    Past Surgical History:  Procedure Laterality Date  . CESAREAN SECTION  x2  last one 02-29-2000   last one w/ BILATERAL TUBAL LIGATION  . COLONOSCOPY  03-14-2009   dx ileocolonscopy  . CYSTOSCOPY WITH RETROGRADE PYELOGRAM, URETEROSCOPY AND STENT PLACEMENT Left 12/20/2014   Procedure: CYSTOSCOPY, LEFT URETEROSCOPY, STONE REMOVAL, LASER LITHOTRIPSY;  Surgeon: Barron Alvine, MD;  Location[facility];  Service: Urology;  Laterality: Left;  . ESOPHAGOGASTRODUODENOSCOPY  last  one 12-31-2008  . ESOPHAGOGASTRODUODENOSCOPY (EGD) WITH PROPOFOL N/A 05/28/2017   Procedure: ESOPHAGOGASTRODUODENOSCOPY (EGD) WITH PROPOFOL;  Surgeon: Malissa Hippo, MD;  [facility];  Service: Endoscopy;  Laterality: N/A;  2:00  . HOLMIUM LASER APPLICATION Left 12/20/2014   Procedure: HOLMIUM LASER APPLICATION;  Surgeon: Barron Alvine, MD;  Location: [facility];  Service: Urology;  Laterality: Left;  . LAPAROSCOPIC ASSISTED VAGINAL HYSTERECTOMY  12-16-2006  . LAPAROSCOPIC NISSEN FUNDOPLICATION  Nov 2001   w/  CHOLECYSTECTOMY  . PORTACATH PLACEMENT N/A 07/12/2017   Procedure: INSERTION PORT-A-CATH;  Surgeon: Franky Macho, MD;  Location: [facility];  Service: General;  Laterality: N/A;  . REPAIR PERFORATED VISCUS  POST WHIPPLE  x5    last one 06-23-2012  . TUBAL LIGATION    . WHIPPLE PROCEDURE  2011   at [facility]     reports that she has never smoked. She has never used smokeless tobacco. She reports that she does not  drink alcohol or use drugs.  Allergies  Allergen Reactions  . Buspar [Buspirone] Other (See Comments)    Dizziness  . Metoclopramide Hcl Other (See Comments)    "lock-jaw"    Family History  Problem Relation Age of Onset  . Diabetes Father    Unacceptable: Noncontributory, unremarkable, or negative. Acceptable: Family history reviewed and not pertinent (If you reviewed it)  Prior to Admission medications   Medication Sig Start Date End Date Taking? Authorizing Provider  Insulin Glargine (LANTUS SOLOSTAR) 100 UNIT/ML Solostar Pen Inject 40 Units into the skin at bedtime.     [provider]  insulin lispro (HUMALOG KWIKPEN) 100 UNIT/ML KiwkPen Inject 10-25 Units into the skin 4 (four) times daily -  with meals and at bedtime. Per sliding scale    [provider]  nortriptyline (PAMELOR) 25 MG capsule TAKE 4 CAPSULES BY MOUTH AT BEDTIME. 02/20/18   Merlyn Albert, MD  ondansetron (ZOFRAN ODT) 4 MG disintegrating tablet Take 1 tablet (4 mg total) by mouth every 8 (eight) hours as needed for nausea or vomiting. 04/29/17   Long, Arlyss Repress, MD  oxyCODONE-acetaminophen (ROXICET) 5-325 MG tablet Take 1 tablet by mouth twice daily as needed for pain 08/14/17   Merlyn Albert, MD  pantoprazole (PROTONIX) 40 MG tablet TAKE ONE TABLET BY MOUTH ONCE DAILY. 11/27/17   Setzer, Brand Males, NP  promethazine (PHENERGAN) 50 MG tablet TAKE 1 TABLET BY MOUTH EVERY 6 HOURS AS NEEDED FOR NAUSEA OR VOMITING. 11/08/17   Merlyn Albert, MD  promethazine (PHENERGAN) 50 MG tablet TAKE 1 TABLET BY MOUTH EVERY 6 HOURS AS NEEDED FOR NAUSEA OR VOMITING. 11/25/17   Merlyn Albert, MD  promethazine (PHENERGAN) 50 MG tablet TAKE 1 TABLET BY MOUTH EVERY 6 HOURS AS NEEDED FOR NAUSEA OR VOMITING. 11/27/17   Merlyn Albert, MD  promethazine (PHENERGAN) 50 MG tablet TAKE 1 TABLET BY MOUTH EVERY 6 HOURS AS NEEDED FOR NAUSEA OR VOMITING. 01/01/18   Merlyn Albert, MD  promethazine (PHENERGAN) 50 MG tablet  TAKE 1 TABLET BY MOUTH EVERY 6 HOURS AS NEEDED FOR NAUSEA OR VOMITING. 01/21/18   Merlyn Albert, MD  promethazine (PHENERGAN) 50 MG tablet TAKE 1 TABLET BY MOUTH EVERY 6 HOURS AS NEEDED FOR NAUSEA OR VOMITING. 02/09/18   Merlyn Albert, MD  promethazine (PHENERGAN) 50 MG tablet TAKE 1 TABLET BY MOUTH EVERY 6 HOURS AS NEEDED FOR NAUSEA OR VOMITING. 02/26/18   Merlyn Albert, MD  topiramate (TOPAMAX) 25 MG tablet TAKE 1 TABLET EVERY MORNING AND 2 TABLETS AT BEDTIME. Patient taking differently: TAKE 25 MG EVERY MORNING AND 50 MG AT BEDTIME. 05/21/17   Merlyn Albert, MD  triamcinolone ointment (KENALOG) 0.5 % Apply 1 application topically 2 (two) times daily. 11/18/17   Merlyn Albert, MD  zolpidem (AMBIEN) 10 MG tablet TAKE ONE TABLET BY MOUTH AT BEDTIME AS NEEDED FOR SLEEP. 11/01/17   Merlyn Albert, MD    Physical  Exam: Vitals:   02/28/18 0730 02/28/18 0800 02/28/18 0830 02/28/18 0847  BP: 121/79 135/75 108/72 116/85  Pulse: (!) 101 (!) 109 (!) 107 (!) 103  Resp:    16  Temp:    97.7 F (36.5 C)  TempSrc:    Oral  SpO2: 92% 100% 96% 96%  Weight:    64.2 kg (141 lb 8.6 oz)  Height:     (1.6 m)    Constitutional: NAD, calm, comfortable Vitals:   02/28/18 0730 02/28/18 0800 02/28/18 0830 02/28/18 0847  BP: 121/79 135/75 108/72 116/85  Pulse: (!) 101 (!) 109 (!) 107 (!) 103  Resp:    16  Temp:    97.7 F (36.5 C)  TempSrc:    Oral  SpO2: 92% 100% 96% 96%  Weight:    64.2 kg (141 lb 8.6 oz)  Height:     (1.6 m)   Eyes: Anicteric sclera ENMT: Mucous membranes, poor dentition Neck: normal, supple Respiratory: clear to auscultation bilaterally, no wheezing, no crackles. Normal respiratory effort. No accessory muscle use.  Cardiovascular: Regular rate and rhythm, no murmurs Abdomen: Soft, tender to deep palpation and soft palpation in epigastric area, no rebound or guarding, diminished bowel sounds Musculoskeletal: No lower extremity edema Skin: no rashes on  visible skin Neurologic: Grossly intact, moving all extremities Psychiatric: Normal judgment and insight. Alert and oriented x 3. Normal mood.    Labs on Admission: I have personally reviewed following labs and imaging studies  CBC: Recent Labs  Lab 02/28/18 0516  WBC 6.6  NEUTROABS 3.0  HGB 12.9  HCT 38.1  MCV 93.8  PLT 165   Basic Metabolic Panel: Recent Labs  Lab 02/28/18 0516  NA 134*  K 3.8  CL 103  CO2 24  GLUCOSE 283*  BUN 16  CREATININE 0.50  CALCIUM 8.8*   GFR: Estimated Creatinine Clearance: 79.2 mL/min (by C-G formula based on SCr of 0.5 mg/dL). Liver Function Tests: Recent Labs  Lab 02/28/18 0516  AST 36  ALT 40  ALKPHOS 118  BILITOT 0.7  PROT 6.8  ALBUMIN 3.1*   Recent Labs  Lab 02/28/18 0516  LIPASE 16   No results for input(s): AMMONIA in the last 168 hours. Coagulation Profile: No results for input(s): INR, PROTIME in the last 168 hours. Cardiac Enzymes: No results for input(s): CKTOTAL, CKMB, CKMBINDEX, TROPONINI in the last 168 hours. BNP (last 3 results) No results for input(s): PROBNP in the last 8760 hours. HbA1C: No results for input(s): HGBA1C in the last 72 hours. CBG: No results for input(s): GLUCAP in the last 168 hours. Lipid Profile: No results for input(s): CHOL, HDL, LDLCALC, TRIG, CHOLHDL, LDLDIRECT in the last 72 hours. Thyroid Function Tests: No results for input(s): TSH, T4TOTAL, FREET4, T3FREE, THYROIDAB in the last 72 hours. Anemia Panel: No results for input(s): VITAMINB12, FOLATE, FERRITIN, TIBC, IRON, RETICCTPCT in the last 72 hours. Urine analysis:    Component Value Date/Time   COLORURINE YELLOW 04/29/2017 1251   APPEARANCEUR HAZY (A) 04/29/2017 1251   LABSPEC 1.017 04/29/2017 1251   PHURINE 5.0 04/29/2017 1251   GLUCOSEU >=500 (A) 04/29/2017 1251   HGBUR MODERATE (A) 04/29/2017 1251   BILIRUBINUR NEGATIVE 04/29/2017 1251   KETONESUR NEGATIVE 04/29/2017 1251   PROTEINUR NEGATIVE 04/29/2017 1251    UROBILINOGEN 2.0 01/02/2016 1509   UROBILINOGEN 0.2 11/18/2014 1213   NITRITE NEGATIVE 04/29/2017 1251   LEUKOCYTESUR NEGATIVE 04/29/2017 1251    Radiological Exams on Admission: No results found.  EKG: Independently reviewed.  No EKG performed  Assessment/Plan Active Problems:   BIPOLAR DISORDER UNSPECIFIED   RENAL CALCULUS, HX OF   Abdominal pain   DM type 2 (diabetes mellitus, type 2) (HCC)   Abdominal pain, chronic, epigastric   #) Chronic abdominal pain: It is unclear the etiology of this chronic abdominal pain.  Apparently a portion of her pancreas was left and she has been told that this chronic pancreatitis could become recurrent and is likely the source.  She reports having an evaluation for gastroparesis several years in the past that was negative.  She also reports that her gallbladder was removed surgically prior to the Whipple procedure.  On chart review she has had multiple multiple CT scans that have shown only incidental findings and MRCP that has been fairly unremarkable.  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 -Clear liquid diet -P.o. oxycodone for pain control - IV morphine for breakthrough pain -Gentle IV fluids for 1 day only -Ondansetron and Phenergan for antiemetics  #) Type 2 diabetes: Patient's blood sugars were in the 200s in the emergency department and have been in the 200s at home which she says that around her baseline. -Continue glargine 40 units nightly -Sliding scale insulin, AC at bedtime -Carb restricted diet when tolerating p.o.  #) Pain/psych: -Continue topiramate 25 mg twice daily -Continue nortriptyline 100 mg nightly  Fluids:  Gentle IV fluids Elect lites: Monitor and supplement Nutrition: Clear liquid diet  Prophylaxis: Enoxaparin  Disposition: Pending tolerating p.o. pain medications and improve pain control  Full code     Delaine Lame MD [facility]   If 7PM-7AM, please contact night-coverage [email or web address] Password [facility]  02/28/2018, 8:49 AM

## 2018-02-28 NOTE — ED Notes (Signed)
Hospitalist at bedside 

## 2018-03-01 DIAGNOSIS — E1165 Type 2 diabetes mellitus with hyperglycemia: Secondary | ICD-10-CM | POA: Diagnosis not present

## 2018-03-01 DIAGNOSIS — R1013 Epigastric pain: Secondary | ICD-10-CM | POA: Diagnosis not present

## 2018-03-01 DIAGNOSIS — G8929 Other chronic pain: Secondary | ICD-10-CM | POA: Diagnosis not present

## 2018-03-01 DIAGNOSIS — Z794 Long term (current) use of insulin: Secondary | ICD-10-CM | POA: Diagnosis not present

## 2018-03-01 DIAGNOSIS — K861 Other chronic pancreatitis: Secondary | ICD-10-CM | POA: Diagnosis not present

## 2018-03-01 LAB — GLUCOSE, CAPILLARY
GLUCOSE-CAPILLARY: 233 mg/dL — AB (ref 65–99)
GLUCOSE-CAPILLARY: 244 mg/dL — AB (ref 65–99)
Glucose-Capillary: 243 mg/dL — ABNORMAL HIGH (ref 65–99)
Glucose-Capillary: 107 mg/dL — ABNORMAL HIGH (ref 65–99)
Glucose-Capillary: 249 mg/dL — ABNORMAL HIGH (ref 65–99)

## 2018-03-01 MED ORDER — PROMETHAZINE HCL 25 MG/ML IJ SOLN
25.0000 mg | Freq: Four times a day (QID) | INTRAMUSCULAR | Status: DC | PRN
  Administered 2018-03-01 – 2018-03-02 (×5): 25 mg via INTRAVENOUS
  Filled 2018-03-01 (×5): qty 1

## 2018-03-01 NOTE — Progress Notes (Signed)
PROGRESS NOTE                                                                                                                                                                                                             Patient Demographics:    [name] [name], is a 47 y.o. female, DOB - [birth year], ZOX:[id number]  Admit date - 02/28/2018   Admitting Physician Pratik Hoover Brunette, DO  Outpatient Primary MD for the patient is Gerda Diss, Vilinda Blanks, MD  LOS - 0  Outpatient Specialists: Dr Karilyn Cota  Chief Complaint  Patient presents with  . Abdominal Pain       Brief Narrative   47 year old female with type 2 diabetes mellitus on insulin, recurrent chronic pancreatitis status post Whipple's procedure in 2011 at [facility], chronic abdominal pain, nephrolithiasis, presented with right upper quadrant abdominal pain for past several days worsened recently.  Reports nausea but no vomiting.  The pain radiates to her back and not controlled with p.o. oxycodone at home.  Reports pain worsening with meal, no bowel or urinary symptoms.  Denies any fevers, chest pain, shortness of breath, recent change in her medication or travel. In the ED vitals were stable except for mild tachycardia.  Blood work including CBC and lipase were normal. Placed on observation for further management, IV fluids and pain control.   Subjective:   Still has off-and-on right upper quadrant pain, controlled with oxycodone and PRN morphine.  Reported an episode of vomiting this morning.   Assessment  & Plan :   Principal problem Acute on chronic abdominal pain Patient being told that she has chronic pancreatitis with recurrent symptoms.  Also reports being evaluated for gastroparesis several years ago which was negative.  Had a gallbladder removed surgically prior to Whipple procedure.  On reviewing the chart she has had multiple CT scans without significant finding and  unremarkable MRCP. Acute on chronic pancreatitis is possible. Continue clear liquid diet.  P.o. oxycodone with PRN IV morphine.  IV hydration. Continue Phenergan and Zofran for nausea and vomiting. If no improvement will consult GI.   Type 2 diabetes mellitus with hyperglycemia CBG in the 200s.  Continue Lantus and sliding scale coverage.  Chronic pain Continue nortriptyline and Topamax.   Code Status : Full code  Family Communication  : None at bedside  Disposition Plan  :  Tomorrow if pain improved and tolerating advanced diet  Barriers For Discharge : Active symptoms  Consults  : None  Procedures  : None  DVT Prophylaxis  :  Lovenox  Lab Results  Component Value Date   PLT 165 02/28/2018    Antibiotics  :    Anti-infectives (From admission, onward)   None        Objective:   Vitals:   02/28/18 0847 02/28/18 1307 02/28/18 2201 03/01/18 0459  BP: 116/85 113/75 106/76 119/77  Pulse: (!) 103 96 93 89  Resp: Temp: 97.7 F (36.5 C) 97.9 F (36.6 C) 97.7 F (36.5 C) 97.8 F (36.6 C)  TempSrc: Oral Oral Oral Oral  SpO2: 96% 95% 94% 94%  Weight: 64.2 kg (141 lb 8.6 oz)     Height:  (1.6 m)       Wt Readings from Last 3 Encounters:  02/28/18 64.2 kg (141 lb 8.6 oz)  11/18/17 64.4 kg (142 lb)  08/14/17 65.5 kg (144 lb 6 oz)     Intake/Output Summary (Last 24 hours) at 03/01/2018 1206 Last data filed at 02/28/2018 1700 Gross per 24 hour  Intake 240 ml  Output -  Net 240 ml     Physical Exam  Gen: not in distress HEENT: Dry mucosa, supple neck Chest: clear b/l, no added sounds CVS: N S1&S2, no murmurs, GI: soft, bowel sounds present, nondistended, right upper quadrant tenderness Musculoskeletal: warm, no edema     Data Review:    CBC Recent Labs  Lab 02/28/18 0516  WBC 6.6  HGB 12.9  HCT 38.1  PLT 165  MCV 93.8  MCH 31.8  MCHC 33.9  RDW 13.1  LYMPHSABS 2.9  MONOABS 0.5  EOSABS 0.2  BASOSABS 0.0    Chemistries   Recent Labs  Lab 02/28/18 0516  NA 134*  K 3.8  CL 103  CO2 24  GLUCOSE 283*  BUN 16  CREATININE 0.50  CALCIUM 8.8*  AST 36  ALT 40  ALKPHOS 118  BILITOT 0.7   ------------------------------------------------------------------------------------------------------------------ No results for input(s): CHOL, HDL, LDLCALC, TRIG, CHOLHDL, LDLDIRECT in the last 72 hours.  Lab Results  Component Value Date   HGBA1C 7.2 (H) 05/08/2017   ------------------------------------------------------------------------------------------------------------------ No results for input(s): TSH, T4TOTAL, T3FREE, THYROIDAB in the last 72 hours.  Invalid input(s): FREET3 ------------------------------------------------------------------------------------------------------------------ No results for input(s): VITAMINB12, FOLATE, FERRITIN, TIBC, IRON, RETICCTPCT in the last 72 hours.  Coagulation profile No results for input(s): INR, PROTIME in the last 168 hours.  No results for input(s): DDIMER in the last 72 hours.  Cardiac Enzymes No results for input(s): CKMB, TROPONINI, MYOGLOBIN in the last 168 hours.  Invalid input(s): CK ------------------------------------------------------------------------------------------------------------------ No results found for: BNP  Inpatient Medications  Scheduled Meds: . insulin aspart  0-15 Units Subcutaneous TID WC  . insulin aspart  4 Units Subcutaneous TID WC  . insulin glargine  40 Units Subcutaneous QHS   Continuous Infusions: PRN Meds:.morphine injection, ondansetron (ZOFRAN) IV, oxyCODONE, promethazine  Micro Results No results found for this or any previous visit (from the past 240 hour(s)).  Radiology Reports No results found.  Time Spent in minutes  25   Dashonna Chagnon M.D on 03/01/2018 at 12:06 PM  Between 7am to 7pm - Pager - [phone]  After 7pm go to [email or web address] - password [facility]  [facility] -  Office   [phone]

## 2018-03-01 NOTE — Plan of Care (Signed)
  Problem: Nutrition: Goal: Adequate nutrition will be maintained Outcome: Progressing   Problem: Pain Managment: Goal: General experience of comfort will improve Outcome: Progressing   

## 2018-03-02 DIAGNOSIS — K861 Other chronic pancreatitis: Secondary | ICD-10-CM | POA: Diagnosis not present

## 2018-03-02 DIAGNOSIS — Z794 Long term (current) use of insulin: Secondary | ICD-10-CM | POA: Diagnosis not present

## 2018-03-02 DIAGNOSIS — G8929 Other chronic pain: Secondary | ICD-10-CM | POA: Diagnosis not present

## 2018-03-02 DIAGNOSIS — E1165 Type 2 diabetes mellitus with hyperglycemia: Secondary | ICD-10-CM | POA: Diagnosis not present

## 2018-03-02 DIAGNOSIS — R1013 Epigastric pain: Secondary | ICD-10-CM | POA: Diagnosis not present

## 2018-03-02 LAB — GLUCOSE, CAPILLARY
GLUCOSE-CAPILLARY: 70 mg/dL (ref 65–99)
GLUCOSE-CAPILLARY: 140 mg/dL — AB (ref 65–99)
Glucose-Capillary: 184 mg/dL — ABNORMAL HIGH (ref 65–99)

## 2018-03-02 MED ORDER — HEPARIN SOD (PORK) LOCK FLUSH 100 UNIT/ML IV SOLN
500.0000 [IU] | INTRAVENOUS | Status: DC | PRN
  Filled 2018-03-02: qty 5

## 2018-03-02 NOTE — Progress Notes (Signed)
Patient states understanding of dischagre instructions

## 2018-03-02 NOTE — Discharge Summary (Signed)
Physician Discharge Summary  [name] [name] [id number] DOB: 1970-12-03 DOA: 02/28/2018  PCP: Merlyn Albert, MD  Admit date: 02/28/2018 Discharge date: 03/02/2018  Admitted From: Home Disposition: Home  Recommendations for Outpatient Follow-up:  1. Follow up with PCP in 1-2 weeks   Home Health: None Equipment/Devices: None  Discharge Condition: Fair CODE STATUS: Full code Diet recommendation: Soft diet, advance to regular in next 24-48 hours if tolerated    Discharge Diagnoses:  Principal problem   Abdominal pain, acute on chronic, epigastric  Active Problems:   BIPOLAR DISORDER UNSPECIFIED   RENAL CALCULUS, HX OF   Abdominal pain   DM type 2 (diabetes mellitus, type 2) (HCC)   Chronic pancreatitis [facility])  Brief narrative/HPI 47 year old female with type 2 diabetes mellitus on insulin, recurrent chronic pancreatitis status post Whipple's procedure in 2011 at [facility], chronic abdominal pain, nephrolithiasis, presented with right upper quadrant abdominal pain for past several days worsened recently.  Reports nausea but no vomiting.  The pain radiates to her back and not controlled with p.o. oxycodone at home.  Reports pain worsening with meal, no bowel or urinary symptoms.  Denies any fevers, chest pain, shortness of breath, recent change in her medication or travel. In the ED vitals were stable except for mild tachycardia.  Blood work including CBC and lipase were normal. Placed on observation for further management, IV fluids and pain control.   Hospital course  Principal problem Acute on chronic abdominal pain Patient reports having chronic pancreatitis with recurrent symptoms.  Also reports being evaluated for gastroparesis several years ago which was negative.   Had a gallbladder removed surgically prior to Whipple procedure.    Patient had unremarkable EGD in August 2018.  On reviewing the chart she has had multiple CT scans without significant finding and unremarkable  MRCP. Acute on chronic pancreatitis is possible. Tolerated clear liquid diet.  Advance to soft diet and tolerating with minimal pain. Continue p.o. oxycodone (has prescription at home).  Hydrated with IV fluids.  Continue PPI. Continue Phenergan and Zofran for nausea and vomiting.  Patient clinically stable to be discharged home with outpatient follow-up.   Type 2 diabetes mellitus with hyperglycemia CBG currently stable.  Continue home dose insulin.  Chronic pain Continue nortriptyline and Topamax.     Family Communication  : None at bedside  Disposition Plan  :  Home  Consults  : None  Procedures  : None     Discharge Instructions   Allergies as of 03/02/2018      Reactions   Buspar [buspirone] Other (See Comments)   Dizziness   Metoclopramide Hcl Other (See Comments)   "lock-jaw"      Medication List    STOP taking these medications   topiramate 25 MG tablet Commonly known as:  TOPAMAX     TAKE these medications   HUMALOG KWIKPEN 100 UNIT/ML KiwkPen Generic drug:  insulin lispro Inject 10-25 Units into the skin 4 (four) times daily -  with meals and at bedtime. Per sliding scale   LANTUS SOLOSTAR 100 UNIT/ML Solostar Pen Generic drug:  Insulin Glargine Inject 40 Units into the skin at bedtime.   nortriptyline 25 MG capsule Commonly known as:  PAMELOR TAKE 4 CAPSULES BY MOUTH AT BEDTIME.   ondansetron 4 MG disintegrating tablet Commonly known as:  ZOFRAN ODT Take 1 tablet (4 mg total) by mouth every 8 (eight) hours as needed for nausea or vomiting.   oxyCODONE-acetaminophen 5-325 MG tablet Commonly known as:  ROXICET  Take 1 tablet by mouth twice daily as needed for pain   pantoprazole 40 MG tablet Commonly known as:  PROTONIX TAKE ONE TABLET BY MOUTH ONCE DAILY.   promethazine 50 MG tablet Commonly known as:  PHENERGAN TAKE 1 TABLET BY MOUTH EVERY 6 HOURS AS NEEDED FOR NAUSEA OR VOMITING.   triamcinolone ointment 0.5 % Commonly  known as:  KENALOG Apply 1 application topically 2 (two) times daily.   zolpidem 10 MG tablet Commonly known as:  AMBIEN TAKE ONE TABLET BY MOUTH AT BEDTIME AS NEEDED FOR SLEEP.      Follow-up Information    Merlyn Albert, MD. Schedule an appointment as soon as possible for a visit in 1 week(s).   Specialty:  Family Medicine Contact information: [address] [place] [address] [phone]          Allergies  Allergen Reactions  . Buspar [Buspirone] Other (See Comments)    Dizziness  . Metoclopramide Hcl Other (See Comments)    "lock-jaw"      Subjective: Abdominal pain better.  Diet advance to soft and tolerated with some pain.  No nausea or vomiting.  Discharge Exam: Vitals:   03/02/18 0534 03/02/18 1300  BP: 115/83 117/89  Pulse: 77 100  Resp: 16 16  Temp: 98.4 F (36.9 C) 98.2 F (36.8 C)  SpO2: 97% 97%   Vitals:   03/01/18 1513 03/01/18 2105 03/02/18 0534 03/02/18 1300  BP: 120/83 129/84 115/83 117/89  Pulse: 93 83 77 100  Resp: Temp: 98.4 F (36.9 C) 98.6 F (37 C) 98.4 F (36.9 C) 98.2 F (36.8 C)  TempSrc: Oral Oral Oral Oral  SpO2: 95% 98% 97% 97%  Weight:      Height:        General: Not in distress HEENT: Moist mucosa, supple neck Chest: Clear bilaterally CVS: Normal S1 and S2 GI: Soft, mild epigastric tenderness, nondistended, bowel sounds present Musculoskeletal: Warm, no edema   The results of significant diagnostics from this hospitalization (including imaging, microbiology, ancillary and laboratory) are listed below for reference.     Microbiology: No results found for this or any previous visit (from the past 240 hour(s)).   Labs: BNP (last 3 results) No results for input(s): BNP in the last 8760 hours. Basic Metabolic Panel: Recent Labs  Lab 02/28/18 0516  NA 134*  K 3.8  CL 103  CO2 24  GLUCOSE 283*  BUN 16  CREATININE 0.50  CALCIUM 8.8*   Liver Function Tests: Recent Labs   Lab 02/28/18 0516  AST 36  ALT 40  ALKPHOS 118  BILITOT 0.7  PROT 6.8  ALBUMIN 3.1*   Recent Labs  Lab 02/28/18 0516  LIPASE 16   No results for input(s): AMMONIA in the last 168 hours. CBC: Recent Labs  Lab 02/28/18 0516  WBC 6.6  NEUTROABS 3.0  HGB 12.9  HCT 38.1  MCV 93.8  PLT 165   Cardiac Enzymes: No results for input(s): CKTOTAL, CKMB, CKMBINDEX, TROPONINI in the last 168 hours. BNP: Invalid input(s): POCBNP CBG: Recent Labs  Lab 03/01/18 1646 03/01/18 2107 03/02/18 0747 03/02/18 0927 03/02/18 1128  GLUCAP 107* 249* 70 184* 140*   D-Dimer No results for input(s): DDIMER in the last 72 hours. Hgb A1c No results for input(s): HGBA1C in the last 72 hours. Lipid Profile No results for input(s): CHOL, HDL, LDLCALC, TRIG, CHOLHDL, LDLDIRECT in the last 72 hours. Thyroid function studies No results for  input(s): TSH, T4TOTAL, T3FREE, THYROIDAB in the last 72 hours.  Invalid input(s): FREET3 Anemia work up No results for input(s): VITAMINB12, FOLATE, FERRITIN, TIBC, IRON, RETICCTPCT in the last 72 hours. Urinalysis    Component Value Date/Time   COLORURINE YELLOW 04/29/2017 1251   APPEARANCEUR HAZY (A) 04/29/2017 1251   LABSPEC 1.017 04/29/2017 1251   PHURINE 5.0 04/29/2017 1251   GLUCOSEU >=500 (A) 04/29/2017 1251   HGBUR MODERATE (A) 04/29/2017 1251   BILIRUBINUR NEGATIVE 04/29/2017 1251   KETONESUR NEGATIVE 04/29/2017 1251   PROTEINUR NEGATIVE 04/29/2017 1251   UROBILINOGEN 2.0 01/02/2016 1509   UROBILINOGEN 0.2 11/18/2014 1213   NITRITE NEGATIVE 04/29/2017 1251   LEUKOCYTESUR NEGATIVE 04/29/2017 1251   Sepsis Labs Invalid input(s): PROCALCITONIN,  WBC,  LACTICIDVEN Microbiology No results found for this or any previous visit (from the past 240 hour(s)).   Time coordinating discharge: <30 minutes  SIGNED:   Eddie North, MD  [facility] 03/02/2018, 2:03 PM Pager   If 7PM-7AM, please contact  night-coverage [email or web address] Password [email or web address]

## 2018-03-05 DIAGNOSIS — R109 Unspecified abdominal pain: Secondary | ICD-10-CM | POA: Diagnosis not present

## 2018-03-05 DIAGNOSIS — K859 Acute pancreatitis without necrosis or infection, unspecified: Secondary | ICD-10-CM | POA: Diagnosis not present

## 2018-03-05 DIAGNOSIS — G8929 Other chronic pain: Secondary | ICD-10-CM | POA: Diagnosis not present

## 2018-03-05 DIAGNOSIS — Z79899 Other long term (current) drug therapy: Secondary | ICD-10-CM | POA: Diagnosis not present

## 2018-03-11 ENCOUNTER — Telehealth: Payer: Self-pay | Admitting: Family Medicine

## 2018-03-11 ENCOUNTER — Other Ambulatory Visit: Payer: Self-pay | Admitting: Family Medicine

## 2018-03-11 DIAGNOSIS — R52 Pain, unspecified: Secondary | ICD-10-CM

## 2018-03-11 NOTE — Telephone Encounter (Signed)
Pt returned call. She states that her drug screens are coming back with "all kinds of crazy things that she doesn't even know what they are". She states she keeping all her upcoming appointments with pain med provider until something else can be done. Will place referral for new pain management.

## 2018-03-11 NOTE — Telephone Encounter (Signed)
Left message to return call 

## 2018-03-11 NOTE — Telephone Encounter (Signed)
Pt needs to realize two things 1) i'm not taking on any new pain med pts and will need to remain under the care of a pain specialist at all times in order to get pain med  2)there is ashortage of pain med doctors and she needs to stay with the current one until we can possibly find another  My advice would be to stick with the current if possible but if pt certain she wants new one do referral plz

## 2018-03-11 NOTE — Telephone Encounter (Signed)
ok 

## 2018-03-11 NOTE — Telephone Encounter (Signed)
Patient is requesting different pain management if possible.She states having some issues and would like a referral to  another pain management doctor.

## 2018-03-17 ENCOUNTER — Other Ambulatory Visit (INDEPENDENT_AMBULATORY_CARE_PROVIDER_SITE_OTHER): Payer: Self-pay | Admitting: Internal Medicine

## 2018-03-19 ENCOUNTER — Other Ambulatory Visit: Payer: Self-pay | Admitting: Family Medicine

## 2018-03-20 ENCOUNTER — Encounter: Payer: Self-pay | Admitting: Family Medicine

## 2018-04-03 DIAGNOSIS — G8929 Other chronic pain: Secondary | ICD-10-CM | POA: Diagnosis not present

## 2018-04-03 DIAGNOSIS — Z79899 Other long term (current) drug therapy: Secondary | ICD-10-CM | POA: Diagnosis not present

## 2018-04-03 DIAGNOSIS — R109 Unspecified abdominal pain: Secondary | ICD-10-CM | POA: Diagnosis not present

## 2018-04-15 ENCOUNTER — Other Ambulatory Visit: Payer: Self-pay | Admitting: Family Medicine

## 2018-04-15 DIAGNOSIS — E1065 Type 1 diabetes mellitus with hyperglycemia: Secondary | ICD-10-CM | POA: Diagnosis not present

## 2018-04-15 DIAGNOSIS — E1140 Type 2 diabetes mellitus with diabetic neuropathy, unspecified: Secondary | ICD-10-CM | POA: Diagnosis not present

## 2018-04-15 DIAGNOSIS — K8681 Exocrine pancreatic insufficiency: Secondary | ICD-10-CM | POA: Diagnosis not present

## 2018-04-25 ENCOUNTER — Other Ambulatory Visit: Payer: Self-pay | Admitting: Family Medicine

## 2018-04-25 NOTE — Telephone Encounter (Signed)
1 refill, needs follow-up office visit

## 2018-05-01 DIAGNOSIS — G894 Chronic pain syndrome: Secondary | ICD-10-CM | POA: Diagnosis not present

## 2018-05-01 DIAGNOSIS — R109 Unspecified abdominal pain: Secondary | ICD-10-CM | POA: Diagnosis not present

## 2018-05-01 DIAGNOSIS — Z79899 Other long term (current) drug therapy: Secondary | ICD-10-CM | POA: Diagnosis not present

## 2018-05-01 DIAGNOSIS — K861 Other chronic pancreatitis: Secondary | ICD-10-CM | POA: Diagnosis not present

## 2018-05-01 DIAGNOSIS — G8929 Other chronic pain: Secondary | ICD-10-CM | POA: Diagnosis not present

## 2018-05-05 DIAGNOSIS — K006 Disturbances in tooth eruption: Secondary | ICD-10-CM | POA: Diagnosis not present

## 2018-05-12 ENCOUNTER — Other Ambulatory Visit: Payer: Self-pay | Admitting: Family Medicine

## 2018-05-21 ENCOUNTER — Other Ambulatory Visit: Payer: Self-pay | Admitting: Family Medicine

## 2018-05-21 NOTE — Telephone Encounter (Signed)
Six mo ok 

## 2018-05-27 ENCOUNTER — Encounter: Payer: Self-pay | Admitting: Family Medicine

## 2018-05-27 ENCOUNTER — Ambulatory Visit (INDEPENDENT_AMBULATORY_CARE_PROVIDER_SITE_OTHER): Payer: Medicare HMO | Admitting: Family Medicine

## 2018-05-27 VITALS — BP 128/84 | Ht 63.0 in | Wt 142.6 lb

## 2018-05-27 DIAGNOSIS — G4709 Other insomnia: Secondary | ICD-10-CM | POA: Diagnosis not present

## 2018-05-27 DIAGNOSIS — Z794 Long term (current) use of insulin: Secondary | ICD-10-CM

## 2018-05-27 DIAGNOSIS — G43001 Migraine without aura, not intractable, with status migrainosus: Secondary | ICD-10-CM | POA: Diagnosis not present

## 2018-05-27 DIAGNOSIS — E118 Type 2 diabetes mellitus with unspecified complications: Secondary | ICD-10-CM | POA: Diagnosis not present

## 2018-05-27 DIAGNOSIS — M792 Neuralgia and neuritis, unspecified: Secondary | ICD-10-CM | POA: Diagnosis not present

## 2018-05-27 DIAGNOSIS — K861 Other chronic pancreatitis: Secondary | ICD-10-CM | POA: Diagnosis not present

## 2018-05-27 MED ORDER — NORTRIPTYLINE HCL 25 MG PO CAPS
ORAL_CAPSULE | ORAL | 11 refills | Status: DC
Start: 2018-05-27 — End: 2019-05-29

## 2018-05-27 NOTE — Progress Notes (Signed)
   Subjective:    Patient ID: [name] [name], female    DOB: January 14, 1971, [age] y.o.   MRN: [id number]  HPI Pt here today for medication refills  Working with the pain clinic folks,  Staying at the sme dose of the meds, overall helps but not completely.  Does not wish to go higher.  Patient claims compliance with diabetes medication. No obvious side effects. Reports no substantial low sugar spells. Most numbers are generally in good range when checked fasting. Generally does not miss a dose of medication. Watching diabetic diet closely  morg glu usually good under 200  Sees endocrin every six month, notes her last A1c was decent but does not really remember the exact number   Using the [employer]  Takes most every night, claims no drowsiness.  In the morning   nortyrptiline helps the neruopahy   This is peresent in the legs.  Generally worse in the evening time.  And at night.  Nortriptyline has definitely helped.  Patient not exercising as much as she helps but does walk about 3 times a week fortunately  Dr Romero Bellingbalin   Review of Systems Positive chronic abdominal pain, migraines fortunately rarely is    Objective:   Physical Exam  Alert and oriented, vitals reviewed and stable, NAD ENT-TM's and ext canals WNL bilat via otoscopic exam Soft palate, tonsils and post pharynx WNL via oropharyngeal exam Neck-symmetric, no masses; thyroid nonpalpable and nontender Pulmonary-no tachypnea or accessory muscle use; Clear without wheezes via auscultation Card--no abnrml murmurs, rhythm reg and rate WNL Carotid pulses symmetric, without bruits Feet pulses good some diminished sensation distally      Assessment & Plan:  Impression chronic neuropathy.  Likely related to diabetes.  Helps nortriptyline will maintain  2.  Insomnia.  Ongoing.  With regular use of helping will maintain  3.  Nausea chronic.  Intermittent.  Likely related to chronic pancreatitis may be diabetic also.  Discussed.   Use Phenergan will maintain  Greater than 50% of this 25 minute face to face visit was spent in counseling and discussion and coordination of care regarding the above diagnosis/diagnosies  Also recommended screening wellness exam this fall patient states she will do this

## 2018-05-29 ENCOUNTER — Telehealth: Payer: Self-pay | Admitting: Family Medicine

## 2018-05-29 DIAGNOSIS — R1013 Epigastric pain: Secondary | ICD-10-CM

## 2018-05-29 NOTE — Telephone Encounter (Signed)
Pt requesting referral back to [facility] (Dr. Sherryll BurgerShah) Her pain management specialist recommended she get back in with [facility] due to the abdominal pain she has and her history of a whipple procedure  Please initiate referral in system so that I may process

## 2018-05-29 NOTE — Telephone Encounter (Signed)
Referral placed in Epic.

## 2018-05-29 NOTE — Telephone Encounter (Signed)
sure

## 2018-05-29 NOTE — Addendum Note (Signed)
Addended by: Meredith LeedsSUTTON, Kelani L on: 05/29/2018 01:23 PM   Modules accepted: Orders

## 2018-05-29 NOTE — Telephone Encounter (Signed)
May we proceed with the referral?

## 2018-06-03 DIAGNOSIS — G8929 Other chronic pain: Secondary | ICD-10-CM | POA: Diagnosis not present

## 2018-06-03 DIAGNOSIS — K861 Other chronic pancreatitis: Secondary | ICD-10-CM | POA: Diagnosis not present

## 2018-06-03 DIAGNOSIS — Z79899 Other long term (current) drug therapy: Secondary | ICD-10-CM | POA: Diagnosis not present

## 2018-06-03 DIAGNOSIS — R109 Unspecified abdominal pain: Secondary | ICD-10-CM | POA: Diagnosis not present

## 2018-06-03 DIAGNOSIS — G894 Chronic pain syndrome: Secondary | ICD-10-CM | POA: Diagnosis not present

## 2018-06-04 ENCOUNTER — Other Ambulatory Visit: Payer: Self-pay | Admitting: Family Medicine

## 2018-06-18 ENCOUNTER — Other Ambulatory Visit: Payer: Self-pay | Admitting: Family Medicine

## 2018-06-19 ENCOUNTER — Emergency Department (HOSPITAL_COMMUNITY)
Admission: EM | Admit: 2018-06-19 | Discharge: 2018-06-19 | Disposition: A | Discharge status: Home / Self Care | Payer: Medicare HMO | Attending: Emergency Medicine | Admitting: Emergency Medicine

## 2018-06-19 ENCOUNTER — Telehealth: Payer: Self-pay | Admitting: *Deleted

## 2018-06-19 ENCOUNTER — Other Ambulatory Visit: Payer: Self-pay

## 2018-06-19 ENCOUNTER — Encounter (HOSPITAL_COMMUNITY): Payer: Self-pay

## 2018-06-19 DIAGNOSIS — E119 Type 2 diabetes mellitus without complications: Secondary | ICD-10-CM | POA: Diagnosis not present

## 2018-06-19 DIAGNOSIS — Z794 Long term (current) use of insulin: Secondary | ICD-10-CM | POA: Diagnosis not present

## 2018-06-19 DIAGNOSIS — R1013 Epigastric pain: Secondary | ICD-10-CM | POA: Diagnosis not present

## 2018-06-19 DIAGNOSIS — R110 Nausea: Secondary | ICD-10-CM | POA: Insufficient documentation

## 2018-06-19 DIAGNOSIS — Z79899 Other long term (current) drug therapy: Secondary | ICD-10-CM | POA: Insufficient documentation

## 2018-06-19 LAB — CBC
HCT: 39 % (ref 36.0–46.0)
Hemoglobin: 13.1 g/dL (ref 12.0–15.0)
MCH: 32.3 pg (ref 26.0–34.0)
MCHC: 33.6 g/dL (ref 30.0–36.0)
MCV: 96.3 fL (ref 78.0–100.0)
Platelets: 184 10*3/uL (ref 150–400)
RBC: 4.05 MIL/uL (ref 3.87–5.11)
RDW: 13.1 % (ref 11.5–15.5)
WBC: 5.2 10*3/uL (ref 4.0–10.5)

## 2018-06-19 LAB — COMPREHENSIVE METABOLIC PANEL
ALT: 40 U/L (ref 0–44)
AST: 41 U/L (ref 15–41)
Albumin: 3.4 g/dL — ABNORMAL LOW (ref 3.5–5.0)
Alkaline Phosphatase: 99 U/L (ref 38–126)
Anion gap: 6 (ref 5–15)
BUN: 13 mg/dL (ref 6–20)
CO2: 28 mmol/L (ref 22–32)
Calcium: 8.4 mg/dL — ABNORMAL LOW (ref 8.9–10.3)
Chloride: 105 mmol/L (ref 98–111)
Creatinine, Ser: 0.51 mg/dL (ref 0.44–1.00)
GFR calc Af Amer: >60 mL/min (ref >60)
GFR calc non Af Amer: >60 mL/min (ref >60)
Glucose, Bld: 172 mg/dL — ABNORMAL HIGH (ref 70–99)
Potassium: 4.1 mmol/L (ref 3.5–5.1)
Sodium: 139 mmol/L (ref 135–145)
Total Bilirubin: 0.4 mg/dL (ref 0.3–1.2)
Total Protein: 6.9 g/dL (ref 6.5–8.1)

## 2018-06-19 LAB — URINALYSIS, ROUTINE W REFLEX MICROSCOPIC
Bilirubin Urine: NEGATIVE
Glucose, UA: 50 mg/dL — AB
Ketones, ur: NEGATIVE mg/dL
Leukocytes, UA: NEGATIVE
Nitrite: NEGATIVE
Protein, ur: NEGATIVE mg/dL
Specific Gravity, Urine: 1.018 (ref 1.005–1.030)
pH: 5 (ref 5.0–8.0)

## 2018-06-19 LAB — LIPASE, BLOOD: Lipase: 17 U/L (ref 11–51)

## 2018-06-19 LAB — CBG MONITORING, ED: GLUCOSE-CAPILLARY: 127 mg/dL — AB (ref 70–99)

## 2018-06-19 MED ORDER — SODIUM CHLORIDE 0.9 % IV BOLUS
1000.0000 mL | Freq: Once | INTRAVENOUS | Status: AC
  Administered 2018-06-19: 1000 mL via INTRAVENOUS

## 2018-06-19 MED ORDER — PROMETHAZINE HCL 25 MG/ML IJ SOLN
12.5000 mg | Freq: Once | INTRAMUSCULAR | Status: AC
  Administered 2018-06-19: 12.5 mg via INTRAVENOUS
  Filled 2018-06-19: qty 1

## 2018-06-19 MED ORDER — HEPARIN SOD (PORK) LOCK FLUSH 100 UNIT/ML IV SOLN
INTRAVENOUS | Status: AC
  Filled 2018-06-19: qty 5

## 2018-06-19 MED ORDER — HYDROMORPHONE HCL 1 MG/ML IJ SOLN
1.0000 mg | Freq: Once | INTRAMUSCULAR | Status: AC
  Administered 2018-06-19: 1 mg via INTRAVENOUS
  Filled 2018-06-19: qty 1

## 2018-06-19 NOTE — ED Notes (Signed)
Port port accessed. Tolerated well.  Brisk blood return.

## 2018-06-19 NOTE — Telephone Encounter (Signed)
Patient called stating she was having intense right upper quadrant pain. Patient with a history of pancreatitis. Advised patient to go to the ER for evaluation and treatment. Patient verbalized understanding.

## 2018-06-19 NOTE — ED Triage Notes (Signed)
Pt reports upper abdominal pain for 3 days with nausea

## 2018-06-19 NOTE — ED Provider Notes (Signed)
[facility] EMERGENCY DEPARTMENT Provider Note   CSN: [id number] Arrival date & time: 06/19/18  7829     History   Chief Complaint Chief Complaint  Patient presents with  . Abdominal Pain    HPI Kellyanne R Killough is a 47 y.o. female.  HPI   47 year old female with abdominal pain and nausea.  Symptom onset yesterday.  She reports pain is the upper abdomen somewhat worse in the epigastrium to just right of midline.  Pain is been relatively constant.  Sometimes radiates into the back.  No appreciable exacerbating relieving factors.  No fevers or chills.  She has a past history of pancreatitis.  She is status post Whipple and cholecystectomy.  She states that she has had similar pain previously with what she attributes to pancreatitis.  No sick contacts.  No urinary complaints. Past Medical History:  Diagnosis Date  . Acute bacterial bronchitis    dx 12-06-2014  --  FINISHED Z-PAC-  NO FEVER BUT STILL INTERMITTANT  PRODUCTIVE COUGH  . Anxiety   . Chronic pain    has pain specialist  . GERD (gastroesophageal reflux disease)   . History of chronic gastritis   . History of chronic pancreatitis    multple recurrent--  s/p  whipple 2011  . History of esophageal ulcer   . History of kidney stones   . History of subarachnoid hemorrhage    01-08-2013--  fell on concrete--  resolved without surgical intervention  . History of suicide attempt    hx multiple attempts by overdosing of meds  . IBS (irritable bowel syndrome)   . Left ureteral calculus   . Major depression, recurrent, chronic (HCC)    w/  Hx  multiple suicide attempts (overdose)  . Migraine headache   . Type 2 diabetes mellitus, uncontrolled (HCC)   . Whipple disease 2011    Patient Active Problem List   Diagnosis Date Noted  . Neuropathic pain 05/27/2018  . Epigastric pain 05/15/2017  . Abdominal pain, chronic, epigastric 05/09/2017  . Chronic pancreatitis (HCC) 08/19/2016  . Ureteral calculus 12/20/2014  . Insomnia  06/28/2014  . DM type 2 (diabetes mellitus, type 2) (HCC) 08/13/2013  . Urinary retention 08/13/2013  . Abdominal pain 05/19/2013  . Diabetes 01/22/2013  . Subarachnoid bleed (HCC) 01/09/2013  . Fall involving sidewalk curb 01/09/2013  . Contusion 01/09/2013  . Pain management 12/25/2012  . Portal vein thrombosis 05/13/2012  . Lesion of liver 05/13/2012  . Constipation 05/13/2012  . BIPOLAR DISORDER UNSPECIFIED 02/03/2009  . Migraine 02/03/2009  . History of digestive system disease 02/03/2009  . Esophageal reflux 02/03/2009  . RENAL CALCULUS, HX OF 02/03/2009  . ANKLE SPRAIN 11/29/2008    Past Surgical History:  Procedure Laterality Date  . CESAREAN SECTION  x2  last one 02-29-2000   last one w/ BILATERAL TUBAL LIGATION  . COLONOSCOPY  03-14-2009   dx ileocolonscopy  . CYSTOSCOPY WITH RETROGRADE PYELOGRAM, URETEROSCOPY AND STENT PLACEMENT Left 12/20/2014   Procedure: CYSTOSCOPY, LEFT URETEROSCOPY, STONE REMOVAL, LASER LITHOTRIPSY;  Surgeon: Barron Alvine, MD;  Location: [facility];  Service: Urology;  Laterality: Left;  . ESOPHAGOGASTRODUODENOSCOPY  last  one 12-31-2008  . ESOPHAGOGASTRODUODENOSCOPY (EGD) WITH PROPOFOL N/A 05/28/2017   Procedure: ESOPHAGOGASTRODUODENOSCOPY (EGD) WITH PROPOFOL;  Surgeon: Malissa Hippo, MD;  [facility];  Service: Endoscopy;  Laterality: N/A;  2:00  . HOLMIUM LASER APPLICATION Left 12/20/2014   Procedure: HOLMIUM LASER APPLICATION;  Surgeon: Barron Alvine, MD;  Location: [facility];  Service: Urology;  Laterality: Left;  . LAPAROSCOPIC ASSISTED VAGINAL HYSTERECTOMY  12-16-2006  . LAPAROSCOPIC NISSEN FUNDOPLICATION  Nov 2001   w/  CHOLECYSTECTOMY  . PORTACATH PLACEMENT N/A 07/12/2017   Procedure: INSERTION PORT-A-CATH;  Surgeon: Franky Macho, MD;  Location: [facility];  Service: General;  Laterality: N/A;  . REPAIR PERFORATED VISCUS  POST WHIPPLE  x5    last one 06-23-2012  . TUBAL LIGATION    . WHIPPLE  PROCEDURE  2011   at [facility] History    Gravida      Para      Term      Preterm      AB      Living  2     SAB      TAB      Ectopic      Multiple      Live Births               Home Medications    Prior to Admission medications   Medication Sig Start Date End Date Taking? Authorizing Provider  Insulin Glargine (LANTUS SOLOSTAR) 100 UNIT/ML Solostar Pen Inject 48 Units into the skin at bedtime.    Yes [provider]  insulin lispro (HUMALOG KWIKPEN) 100 UNIT/ML KiwkPen Inject 10-25 Units into the skin 4 (four) times daily -  with meals and at bedtime. Per sliding scale   Yes [provider]  nortriptyline (PAMELOR) 25 MG capsule TAKE 4 CAPSULES BY MOUTH AT BEDTIME. 05/27/18  Yes Merlyn Albert, MD  oxyCODONE-acetaminophen (ROXICET) 5-325 MG tablet Take 1 tablet by mouth twice daily as needed for pain 08/14/17  Yes Merlyn Albert, MD  pantoprazole (PROTONIX) 40 MG tablet TAKE ONE TABLET BY MOUTH ONCE DAILY. 03/18/18  Yes Setzer, Terri L, NP  pregabalin (LYRICA) 50 MG capsule Take 1 capsule by mouth 3 (three) times daily. 06/12/18  Yes [provider]  promethazine (PHENERGAN) 50 MG tablet TAKE 1 TABLET BY MOUTH EVERY 6 HOURS AS NEEDED FOR NAUSEA OR VOMITING. 11/08/17  Yes Merlyn Albert, MD  zolpidem (AMBIEN) 10 MG tablet TAKE ONE TABLET BY MOUTH AT BEDTIME AS NEEDED FOR SLEEP. 05/21/18  Yes Merlyn Albert, MD  ondansetron (ZOFRAN ODT) 4 MG disintegrating tablet Take 1 tablet (4 mg total) by mouth every 8 (eight) hours as needed for nausea or vomiting. Patient not taking: Reported on 06/19/2018 04/29/17   Long, Arlyss Repress, MD  promethazine (PHENERGAN) 50 MG tablet TAKE 1 TABLET BY MOUTH EVERY 6 HOURS AS NEEDED FOR NAUSEA OR VOMITING. Patient not taking: Reported on 06/19/2018 03/19/18   Merlyn Albert, MD  promethazine (PHENERGAN) 50 MG tablet TAKE 1 TABLET BY MOUTH EVERY 6 HOURS AS NEEDED FOR NAUSEA OR VOMITING. Patient not taking:  Reported on 06/19/2018 04/15/18   Merlyn Albert, MD  promethazine (PHENERGAN) 50 MG tablet TAKE 1 TABLET BY MOUTH EVERY 6 HOURS AS NEEDED FOR NAUSEA OR VOMITING. Patient not taking: Reported on 06/19/2018 05/13/18   Merlyn Albert, MD  promethazine (PHENERGAN) 50 MG tablet TAKE 1 TABLET BY MOUTH EVERY 6 HOURS AS NEEDED FOR NAUSEA OR VOMITING. Patient not taking: Reported on 06/19/2018 06/04/18   Merlyn Albert, MD  promethazine (PHENERGAN) 50 MG tablet TAKE 1 TABLET BY MOUTH EVERY 6 HOURS AS NEEDED FOR NAUSEA OR VOMITING. 06/19/18   Merlyn Albert, MD  triamcinolone ointment (KENALOG) 0.5 % Apply 1 application topically 2 (two) times  daily. Patient not taking: Reported on 06/19/2018 11/18/17   Merlyn Albert, MD    Family History Family History  Problem Relation Age of Onset  . Diabetes Father     Social History Social History   Tobacco Use  . Smoking status: Never Smoker  . Smokeless tobacco: Never Used  Substance Use Topics  . Alcohol use: No  . Drug use: No     Allergies   Buspar [buspirone] and Metoclopramide hcl   Review of Systems Review of Systems  All systems reviewed and negative, other than as noted in HPI.  Physical Exam Updated Vital Signs BP 114/84 (BP Location: Left Arm)   Pulse 88   Temp 98.2 F (36.8 C) (Oral)   Resp 16   Ht 5\' 3"  (1.6 m)   Wt 64.9 kg   SpO2 96%   BMI 25.33 kg/m   Physical Exam  Constitutional: She appears well-developed and well-nourished. No distress.  HENT:  Head: Normocephalic and atraumatic.  Eyes: Conjunctivae are normal. Right eye exhibits no discharge. Left eye exhibits no discharge.  Neck: Neck supple.  Cardiovascular: Normal rate, regular rhythm and normal heart sounds. Exam reveals no gallop and no friction rub.  No murmur heard. Pulmonary/Chest: Effort normal and breath sounds normal. No respiratory distress.  Abdominal: Soft. She exhibits no distension. There is tenderness.  Tenderness epigastrium and to a  lesser degree in the right upper quadrant without rebound or guarding.  No distention.  Musculoskeletal: She exhibits no edema or tenderness.  Neurological: She is alert.  Skin: Skin is warm and dry.  Psychiatric: She has a normal mood and affect. Her behavior is normal. Thought content normal.  Nursing note and vitals reviewed.    ED Treatments / Results  Labs (all labs ordered are listed, but only abnormal results are displayed) Labs Reviewed  COMPREHENSIVE METABOLIC PANEL - Abnormal; Notable for the following components:      Result Value   Glucose, Bld 172 (*)    Calcium 8.4 (*)    Albumin 3.4 (*)    All other components within normal limits  URINALYSIS, ROUTINE W REFLEX MICROSCOPIC - Abnormal; Notable for the following components:   APPearance HAZY (*)    Glucose, UA 50 (*)    Hgb urine dipstick MODERATE (*)    Bacteria, UA FEW (*)    All other components within normal limits  CBG MONITORING, ED - Abnormal; Notable for the following components:   Glucose-Capillary 127 (*)    All other components within normal limits  LIPASE, BLOOD  CBC    EKG None  Radiology No results found.  Procedures Procedures (including critical care time)  Medications Ordered in ED Medications  sodium chloride 0.9 % bolus 1,000 mL (has no administration in time range)  HYDROmorphone (DILAUDID) injection 1 mg (has no administration in time range)  HYDROmorphone (DILAUDID) injection 1 mg (1 mg Intravenous Given 06/19/18 1147)  promethazine (PHENERGAN) injection 12.5 mg (12.5 mg Intravenous Given 06/19/18 1147)  sodium chloride 0.9 % bolus 1,000 mL ( Intravenous Stopped 06/19/18 1251)  HYDROmorphone (DILAUDID) injection 1 mg (1 mg Intravenous Given 06/19/18 1242)     Initial Impression / Assessment and Plan / ED Course  I have reviewed the triage vital signs and the nursing notes.  Pertinent labs & imaging results that were available during my care of the patient were reviewed by me and  considered in my medical decision making (see chart for details).     47 year old female  with acute on chronic abdominal pain.  Symptoms much improved.  She does have symptoms on exam, but no peritonitis.  She is afebrile.  Labs fairly reassuring.  She is status post cholecystectomy.  Her symptoms have improved with symptomatic treatment.  I doubt emergent process at this time.  Plan continue symptomatic treatment.  Return precautions were discussed.   Final Clinical Impressions(s) / ED Diagnoses   Final diagnoses:  Epigastric pain    ED Discharge Orders    None       Raeford RazorKohut, Ethelyne Erich, MD 06/21/18 1147

## 2018-06-29 ENCOUNTER — Encounter (HOSPITAL_COMMUNITY): Payer: Self-pay | Admitting: Emergency Medicine

## 2018-06-29 ENCOUNTER — Other Ambulatory Visit: Payer: Self-pay

## 2018-06-29 ENCOUNTER — Emergency Department (HOSPITAL_COMMUNITY)
Admission: EM | Admit: 2018-06-29 | Discharge: 2018-06-29 | Disposition: A | Discharge status: Home / Self Care | Payer: Medicare HMO | Attending: Emergency Medicine | Admitting: Emergency Medicine

## 2018-06-29 DIAGNOSIS — E119 Type 2 diabetes mellitus without complications: Secondary | ICD-10-CM | POA: Diagnosis not present

## 2018-06-29 DIAGNOSIS — R1010 Upper abdominal pain, unspecified: Secondary | ICD-10-CM | POA: Insufficient documentation

## 2018-06-29 DIAGNOSIS — Z794 Long term (current) use of insulin: Secondary | ICD-10-CM | POA: Diagnosis not present

## 2018-06-29 DIAGNOSIS — R1013 Epigastric pain: Secondary | ICD-10-CM | POA: Diagnosis not present

## 2018-06-29 DIAGNOSIS — Z79899 Other long term (current) drug therapy: Secondary | ICD-10-CM | POA: Insufficient documentation

## 2018-06-29 LAB — CBC
HCT: 38.6 % (ref 36.0–46.0)
Hemoglobin: 12.9 g/dL (ref 12.0–15.0)
MCH: 32.9 pg (ref 26.0–34.0)
MCHC: 33.4 g/dL (ref 30.0–36.0)
MCV: 98.5 fL (ref 78.0–100.0)
PLATELETS: 153 10*3/uL (ref 150–400)
RBC: 3.92 MIL/uL (ref 3.87–5.11)
RDW: 13.1 % (ref 11.5–15.5)
WBC: 5 10*3/uL (ref 4.0–10.5)

## 2018-06-29 LAB — COMPREHENSIVE METABOLIC PANEL
ALT: 31 U/L (ref 0–44)
AST: 39 U/L (ref 15–41)
Albumin: 3.3 g/dL — ABNORMAL LOW (ref 3.5–5.0)
Alkaline Phosphatase: 90 U/L (ref 38–126)
Anion gap: 7 (ref 5–15)
BILIRUBIN TOTAL: 0.5 mg/dL (ref 0.3–1.2)
BUN: 8 mg/dL (ref 6–20)
CHLORIDE: 106 mmol/L (ref 98–111)
CO2: 23 mmol/L (ref 22–32)
Calcium: 8.6 mg/dL — ABNORMAL LOW (ref 8.9–10.3)
Creatinine, Ser: 0.57 mg/dL (ref 0.44–1.00)
GFR calc Af Amer: >60 mL/min (ref >60)
Glucose, Bld: 389 mg/dL — ABNORMAL HIGH (ref 70–99)
Potassium: 4.1 mmol/L (ref 3.5–5.1)
Sodium: 136 mmol/L (ref 135–145)
TOTAL PROTEIN: 6.6 g/dL (ref 6.5–8.1)

## 2018-06-29 LAB — URINALYSIS, ROUTINE W REFLEX MICROSCOPIC
BILIRUBIN URINE: NEGATIVE
Bacteria, UA: NONE SEEN
Glucose, UA: >=500 mg/dL — AB
Ketones, ur: NEGATIVE mg/dL
LEUKOCYTES UA: NEGATIVE
Nitrite: NEGATIVE
PH: 6 (ref 5.0–8.0)
Protein, ur: NEGATIVE mg/dL
SPECIFIC GRAVITY, URINE: 1.014 (ref 1.005–1.030)

## 2018-06-29 LAB — LIPASE, BLOOD: Lipase: 17 U/L (ref 11–51)

## 2018-06-29 MED ORDER — SODIUM CHLORIDE 0.9 % IV BOLUS
1000.0000 mL | Freq: Once | INTRAVENOUS | Status: AC
  Administered 2018-06-29: 1000 mL via INTRAVENOUS

## 2018-06-29 MED ORDER — PROMETHAZINE HCL 25 MG/ML IJ SOLN
25.0000 mg | Freq: Once | INTRAMUSCULAR | Status: AC
  Administered 2018-06-29: 25 mg via INTRAVENOUS
  Filled 2018-06-29: qty 1

## 2018-06-29 MED ORDER — HYDROMORPHONE HCL 1 MG/ML IJ SOLN
1.0000 mg | Freq: Once | INTRAMUSCULAR | Status: AC
  Administered 2018-06-29: 1 mg via INTRAVENOUS
  Filled 2018-06-29: qty 1

## 2018-06-29 MED ORDER — HEPARIN SOD (PORK) LOCK FLUSH 100 UNIT/ML IV SOLN
INTRAVENOUS | Status: AC
  Administered 2018-06-29: 16:00:00
  Filled 2018-06-29: qty 5

## 2018-06-29 NOTE — ED Triage Notes (Signed)
Pt c/o abd pain for a week, LBM yesterday was normal. States N/. Denies V/D/, GU sx, fever.

## 2018-06-29 NOTE — ED Provider Notes (Signed)
[facility] EMERGENCY DEPARTMENT Provider Note   CSN: [id number] Arrival date & time: 06/29/18  1140     History   Chief Complaint Chief Complaint  Patient presents with  . Abdominal Pain    HPI [name] [name] is a 47 y.o. female.  He presents to the ED with abdominal pain for 2 weeks.  She has a history of chronic abdominal pain and is status post Coley and Whipple.  Sounds like she has had abdominal pain for at least 4 years and follows with Dr. Dionicia Abler from GI.  On review of her prior records it looks like she gets admitted every couple months and has had multiple CT scans.  She is not sure what started this pain this time around but it is central and epigastric and radiates through to her back and up to her left shoulder severe in nature.  Associated with nausea.  No vomiting no diarrhea no constipation no urinary symptoms.  No fevers or chills.  She takes oxycodone 5 mg 3 times a day and this has not been controlling her symptoms.  Of note she was here about 10 days ago for same had labs no imaging and was able to feel better and go home after some narcotic pain medicine and fluids.  This pain is similar to her prior episodes.  The history is provided by the patient.  Abdominal Pain   This is a recurrent problem. Episode onset: 2 weeks. The problem occurs constantly. The problem has not changed since onset.The pain is associated with an unknown factor. The pain is located in the epigastric region. The quality of the pain is sharp. The pain is severe. Associated symptoms include nausea. Pertinent negatives include fever, diarrhea, hematochezia, vomiting, constipation, dysuria, frequency, hematuria and arthralgias. Nothing aggravates the symptoms. Nothing relieves the symptoms. Past workup includes CT scan and surgery.    Past Medical History:  Diagnosis Date  . Acute bacterial bronchitis    dx 12-06-2014  --  FINISHED Z-PAC-  NO FEVER BUT STILL INTERMITTANT  PRODUCTIVE COUGH  . Anxiety    . Chronic pain    has pain specialist  . GERD (gastroesophageal reflux disease)   . History of chronic gastritis   . History of chronic pancreatitis    multple recurrent--  s/p  whipple 2011  . History of esophageal ulcer   . History of kidney stones   . History of subarachnoid hemorrhage    01-08-2013--  fell on concrete--  resolved without surgical intervention  . History of suicide attempt    hx multiple attempts by overdosing of meds  . IBS (irritable bowel syndrome)   . Left ureteral calculus   . Major depression, recurrent, chronic (HCC)    w/  Hx  multiple suicide attempts (overdose)  . Migraine headache   . Type 2 diabetes mellitus, uncontrolled (HCC)   . Whipple disease 2011    Patient Active Problem List   Diagnosis Date Noted  . Neuropathic pain 05/27/2018  . Epigastric pain 05/15/2017  . Abdominal pain, chronic, epigastric 05/09/2017  . Chronic pancreatitis (HCC) 08/19/2016  . Ureteral calculus 12/20/2014  . Insomnia 06/28/2014  . DM type 2 (diabetes mellitus, type 2) (HCC) 08/13/2013  . Urinary retention 08/13/2013  . Abdominal pain 05/19/2013  . Diabetes 01/22/2013  . Subarachnoid bleed (HCC) 01/09/2013  . Fall involving sidewalk curb 01/09/2013  . Contusion 01/09/2013  . Pain management 12/25/2012  . Portal vein thrombosis 05/13/2012  . Lesion of liver 05/13/2012  .  Constipation 05/13/2012  . BIPOLAR DISORDER UNSPECIFIED 02/03/2009  . Migraine 02/03/2009  . History of digestive system disease 02/03/2009  . Esophageal reflux 02/03/2009  . RENAL CALCULUS, HX OF 02/03/2009  . ANKLE SPRAIN 11/29/2008    Past Surgical History:  Procedure Laterality Date  . CESAREAN SECTION  x2  last one 02-29-2000   last one w/ BILATERAL TUBAL LIGATION  . COLONOSCOPY  03-14-2009   dx ileocolonscopy  . CYSTOSCOPY WITH RETROGRADE PYELOGRAM, URETEROSCOPY AND STENT PLACEMENT Left 12/20/2014   Procedure: CYSTOSCOPY, LEFT URETEROSCOPY, STONE REMOVAL, LASER LITHOTRIPSY;   Surgeon: Barron Alvine, MD;  Location: [facility];  Service: Urology;  Laterality: Left;  . ESOPHAGOGASTRODUODENOSCOPY  last  one 12-31-2008  . ESOPHAGOGASTRODUODENOSCOPY (EGD) WITH PROPOFOL N/A 05/28/2017   Procedure: ESOPHAGOGASTRODUODENOSCOPY (EGD) WITH PROPOFOL;  Surgeon: Malissa Hippo, MD;  Location[facility];  Service: Endoscopy;  Laterality: N/A;  2:00  . HOLMIUM LASER APPLICATION Left 12/20/2014   Procedure: HOLMIUM LASER APPLICATION;  Surgeon: Barron Alvine, MD;  Location: [facility];  Service: Urology;  Laterality: Left;  . LAPAROSCOPIC ASSISTED VAGINAL HYSTERECTOMY  12-16-2006  . LAPAROSCOPIC NISSEN FUNDOPLICATION  Nov 2001   w/  CHOLECYSTECTOMY  . PORTACATH PLACEMENT N/A 07/12/2017   Procedure: INSERTION PORT-A-CATH;  Surgeon: Franky Macho, MD;  Location: [facility];  Service: General;  Laterality: N/A;  . REPAIR PERFORATED VISCUS  POST WHIPPLE  x5    last one 06-23-2012  . TUBAL LIGATION    . WHIPPLE PROCEDURE  2011   at [facility] History    Gravida      Para      Term      Preterm      AB      Living  2     SAB      TAB      Ectopic      Multiple      Live Births               Home Medications    Prior to Admission medications   Medication Sig Start Date End Date Taking? Authorizing Provider  Insulin Glargine (LANTUS SOLOSTAR) 100 UNIT/ML Solostar Pen Inject 48 Units into the skin at bedtime.     [provider]  insulin lispro (HUMALOG KWIKPEN) 100 UNIT/ML KiwkPen Inject 10-25 Units into the skin 4 (four) times daily -  with meals and at bedtime. Per sliding scale    [provider]  nortriptyline (PAMELOR) 25 MG capsule TAKE 4 CAPSULES BY MOUTH AT BEDTIME. 05/27/18   Merlyn Albert, MD  ondansetron (ZOFRAN ODT) 4 MG disintegrating tablet Take 1 tablet (4 mg total) by mouth every 8 (eight) hours as needed for nausea or vomiting. Patient not taking: Reported on 06/19/2018 04/29/17   Maia Plan,  MD  oxyCODONE-acetaminophen (ROXICET) 5-325 MG tablet Take 1 tablet by mouth twice daily as needed for pain 08/14/17   Merlyn Albert, MD  pantoprazole (PROTONIX) 40 MG tablet TAKE ONE TABLET BY MOUTH ONCE DAILY. 03/18/18   Setzer, Brand Males, NP  pregabalin (LYRICA) 50 MG capsule Take 1 capsule by mouth 3 (three) times daily. 06/12/18   [provider]  promethazine (PHENERGAN) 50 MG tablet TAKE 1 TABLET BY MOUTH EVERY 6 HOURS AS NEEDED FOR NAUSEA OR VOMITING. 11/08/17   Merlyn Albert, MD  promethazine (PHENERGAN) 50 MG tablet TAKE 1 TABLET BY MOUTH EVERY 6 HOURS AS NEEDED FOR NAUSEA OR  VOMITING. Patient not taking: Reported on 06/19/2018 03/19/18   Merlyn Albert, MD  promethazine (PHENERGAN) 50 MG tablet TAKE 1 TABLET BY MOUTH EVERY 6 HOURS AS NEEDED FOR NAUSEA OR VOMITING. Patient not taking: Reported on 06/19/2018 04/15/18   Merlyn Albert, MD  promethazine (PHENERGAN) 50 MG tablet TAKE 1 TABLET BY MOUTH EVERY 6 HOURS AS NEEDED FOR NAUSEA OR VOMITING. Patient not taking: Reported on 06/19/2018 05/13/18   Merlyn Albert, MD  promethazine (PHENERGAN) 50 MG tablet TAKE 1 TABLET BY MOUTH EVERY 6 HOURS AS NEEDED FOR NAUSEA OR VOMITING. Patient not taking: Reported on 06/19/2018 06/04/18   Merlyn Albert, MD  promethazine (PHENERGAN) 50 MG tablet TAKE 1 TABLET BY MOUTH EVERY 6 HOURS AS NEEDED FOR NAUSEA OR VOMITING. 06/19/18   Merlyn Albert, MD  triamcinolone ointment (KENALOG) 0.5 % Apply 1 application topically 2 (two) times daily. Patient not taking: Reported on 06/19/2018 11/18/17   Merlyn Albert, MD  zolpidem (AMBIEN) 10 MG tablet TAKE ONE TABLET BY MOUTH AT BEDTIME AS NEEDED FOR SLEEP. 05/21/18   Merlyn Albert, MD    Family History Family History  Problem Relation Age of Onset  . Diabetes Father     Social History Social History   Tobacco Use  . Smoking status: Never Smoker  . Smokeless tobacco: Never Used  Substance Use Topics  . Alcohol use: No  . Drug use: No      Allergies   Buspar [buspirone] and Metoclopramide hcl   Review of Systems Review of Systems  Constitutional: Negative for chills and fever.  HENT: Negative for ear pain and sore throat.   Eyes: Negative for pain and visual disturbance.  Respiratory: Negative for cough and shortness of breath.   Cardiovascular: Negative for chest pain and palpitations.  Gastrointestinal: Positive for abdominal pain and nausea. Negative for constipation, diarrhea, hematochezia and vomiting.  Genitourinary: Negative for dysuria, frequency and hematuria.  Musculoskeletal: Negative for arthralgias and back pain.  Skin: Negative for color change and rash.  Neurological: Negative for seizures and syncope.  All other systems reviewed and are negative.    Physical Exam Updated Vital Signs BP 117/81 (BP Location: Right Arm)   Pulse (!) 115   Temp 97.9 F (36.6 C) (Temporal)   Resp 14   Ht 5\' 3"  (1.6 m)   Wt 64.9 kg   SpO2 97%   BMI 25.33 kg/m   Physical Exam  Constitutional: She appears well-developed and well-nourished. No distress.  HENT:  Head: Normocephalic and atraumatic.  Eyes: Conjunctivae are normal.  Neck: Neck supple.  Cardiovascular: Regular rhythm. Tachycardia present.  No murmur heard. Pulmonary/Chest: Effort normal and breath sounds normal. No respiratory distress.  Abdominal: Soft. She exhibits no mass. There is tenderness in the right upper quadrant, epigastric area and left upper quadrant. There is no rigidity and no guarding.  Musculoskeletal: She exhibits no edema, tenderness or deformity.  Neurological: She is alert. She has normal strength. Gait normal. GCS eye subscore is 4. GCS verbal subscore is 5. GCS motor subscore is 6.  Skin: Skin is warm and dry. Capillary refill takes less than 2 seconds.  Psychiatric: She has a normal mood and affect.  Nursing note and vitals reviewed.    ED Treatments / Results  Labs (all labs ordered are listed, but only abnormal  results are displayed) Labs Reviewed  COMPREHENSIVE METABOLIC PANEL - Abnormal; Notable for the following components:      Result Value  Glucose, Bld 389 (*)    Calcium 8.6 (*)    Albumin 3.3 (*)    All other components within normal limits  URINALYSIS, ROUTINE W REFLEX MICROSCOPIC - Abnormal; Notable for the following components:   Glucose, UA >=500 (*)    Hgb urine dipstick SMALL (*)    All other components within normal limits  LIPASE, BLOOD  CBC    EKG None  Radiology No results found.  Procedures Procedures (including critical care time)  Medications Ordered in ED Medications  HYDROmorphone (DILAUDID) injection 1 mg (1 mg Intravenous Given 06/29/18 1241)  sodium chloride 0.9 % bolus 1,000 mL (0 mLs Intravenous Stopped 06/29/18 1346)  promethazine (PHENERGAN) injection 25 mg (25 mg Intravenous Given 06/29/18 1240)  HYDROmorphone (DILAUDID) injection 1 mg (1 mg Intravenous Given 06/29/18 1313)  sodium chloride 0.9 % bolus 1,000 mL ( Intravenous Stopped 06/29/18 1451)  HYDROmorphone (DILAUDID) injection 1 mg (1 mg Intravenous Given 06/29/18 1524)  heparin lock flush 100 UNIT/ML injection (  Given 06/29/18 1554)     Initial Impression / Assessment and Plan / ED Course  I have reviewed the triage vital signs and the nursing notes.  Pertinent labs & imaging results that were available during my care of the patient were reviewed by me and considered in my medical decision making (see chart for details).  Clinical Course as of Jun 29 2124  Sun Jun 29, 2018  [id number] 47 year old female with history of chronic pancreatitis and chronic abdominal pain here with 2 weeks of worsening abdominal pain typical of her exacerbations.  She is tachycardic here afebrile.  Exam is soft but diffuse tenderness mostly in the upper and mid abdomen.  She is getting some screening labs urinalysis and getting IV fluids nausea and pain medicine.  Will need serial re-evaluations.    [MB]  1304 Re-Evaluated  patient.  Her lab work is fairly unremarkable other than an elevated glucose.  She says her pain is essentially unchanged-the first dose of pain medicine.  We discussed that it is unlikely that we are going to be able to come in for a cause of her symptoms and that we are just going for pain management and hydration she is agreeable to that.   [MB]  1510 Reevaluated patient.  She states her pain is 1007 and she is starting to feel little better now.  She thinks maybe 1 more dose and she would be able to be discharged.   [MB]  1535 Patient is calling her daughter for a ride home.  She understands to follow-up with her specialist and return if any worsening symptoms.   [MB]    Clinical Course User Index [MB] Terrilee FilesButler, Cariana Karge C, MD     Final Clinical Impressions(s) / ED Diagnoses   Final diagnoses:  Pain of upper abdomen    ED Discharge Orders    None       Terrilee FilesButler, Eliah Ozawa C, MD 06/29/18 2126

## 2018-06-29 NOTE — ED Notes (Signed)
EDP at bedside  

## 2018-06-29 NOTE — Discharge Instructions (Addendum)
You were evaluated in the emergency department for worsening of your chronic abdominal pain.  You had blood work that did not show an obvious cause of your symptoms.  You received some pain medicine and IV fluids and your symptoms were improving.  You should continue your regular medicines and follow-up with your primary care doctor and your specialists.

## 2018-07-03 DIAGNOSIS — K861 Other chronic pancreatitis: Secondary | ICD-10-CM | POA: Diagnosis not present

## 2018-07-03 DIAGNOSIS — G894 Chronic pain syndrome: Secondary | ICD-10-CM | POA: Diagnosis not present

## 2018-07-03 DIAGNOSIS — Z79899 Other long term (current) drug therapy: Secondary | ICD-10-CM | POA: Diagnosis not present

## 2018-07-08 ENCOUNTER — Encounter (INDEPENDENT_AMBULATORY_CARE_PROVIDER_SITE_OTHER): Payer: Self-pay | Admitting: Internal Medicine

## 2018-07-08 ENCOUNTER — Ambulatory Visit (INDEPENDENT_AMBULATORY_CARE_PROVIDER_SITE_OTHER): Payer: Medicare HMO | Admitting: Internal Medicine

## 2018-07-08 VITALS — BP 128/84 | HR 92 | Temp 98.5°F | Ht 63.0 in | Wt 141.5 lb

## 2018-07-08 DIAGNOSIS — K862 Cyst of pancreas: Secondary | ICD-10-CM

## 2018-07-08 NOTE — Patient Instructions (Signed)
MRI  Labs

## 2018-07-08 NOTE — Progress Notes (Signed)
Subjective:    Patient ID: [name] [name], female    DOB: March 20, 1971, 47 y.o.   MRN: [id number]  PCP [name].  HPI Here today for f/u. Last seen in April of 2018.  She tells me she is in pain management. She c/o epigastric pain radiating into her back. She has had pain for about 2 weeks. Rates pain 7/10.  She says the pain is constant.  Her appetite is so so. No weight loss. BMs. Hx of chronic pancreatitis.   Underwent an MRI abdomen w/wo  CM 08/05/2017: 1. Mildly motion degraded exam. 2. Status post Whipple procedure, without new or acute superimposed process. 3. Decreased size of a perihepatic cystic lesion, favoring a postoperative seroma or chronic hematoma. 4. 8 mm cystic lesion within the remaining pancreas is favored to represent a pseudocyst, not readily apparent on the prior. No acute pancreatitis. 5.  Possible constipation. 6. Hepatic steatosis. 7. Mild small bowel mesenteric adenopathy, similar and likely reactive. 05/28/2017 EGDL: Normal.  Hx of acute/chronic pancreatitis status post Whipple procedure 08/21/2010 by Dr. Cliffton Asters. Hx of enterocutaneousfistula takedown with Dr. Cliffton Asters 03/17/2012 from a feeding tube. Symptoms of pancreatitis after GB surgery 2001  Ventral hernia repair in July of 2016 at [facility] due to the pain.  07/22/2015 Amylase 26, Lipase 18  Has Port a cath      03/14/2009 Dr. [name] Diagnostic ileocolonoscopy.  INDICATIONS FOR PROCEDURE: A 47 year old lady with chronic constipation and intermittent hematochezia. History chronic pancreatitis on pancreatic enzymes supplementation. IMPRESSION: Anal papilla, minimal external hemorrhoidal tags, otherwise normal rectum, normal colon, normal terminal ileum. I suspect trivial anorectal bleeding of benign etiology.  Review of Systems Past Medical History:  Diagnosis Date  . Acute bacterial bronchitis    dx 12-06-2014  --  FINISHED Z-PAC-  NO FEVER BUT STILL INTERMITTANT   PRODUCTIVE COUGH  . Anxiety   . Chronic pain    has pain specialist  . GERD (gastroesophageal reflux disease)   . History of chronic gastritis   . History of chronic pancreatitis    multple recurrent--  s/p  whipple 2011  . History of esophageal ulcer   . History of kidney stones   . History of subarachnoid hemorrhage    01-08-2013--  fell on concrete--  resolved without surgical intervention  . History of suicide attempt    hx multiple attempts by overdosing of meds  . IBS (irritable bowel syndrome)   . Left ureteral calculus   . Major depression, recurrent, chronic (HCC)    w/  Hx  multiple suicide attempts (overdose)  . Migraine headache   . Type 2 diabetes mellitus, uncontrolled (HCC)   . Whipple disease 2011    Past Surgical History:  Procedure Laterality Date  . CESAREAN SECTION  x2  last one 02-29-2000   last one w/ BILATERAL TUBAL LIGATION  . COLONOSCOPY  03-14-2009   dx ileocolonscopy  . CYSTOSCOPY WITH RETROGRADE PYELOGRAM, URETEROSCOPY AND STENT PLACEMENT Left 12/20/2014   Procedure: CYSTOSCOPY, LEFT URETEROSCOPY, STONE REMOVAL, LASER LITHOTRIPSY;  Surgeon: Barron Alvine, MD;  Location: [facility];  Service: Urology;  Laterality: Left;  . ESOPHAGOGASTRODUODENOSCOPY  last  one 12-31-2008  . ESOPHAGOGASTRODUODENOSCOPY (EGD) WITH PROPOFOL N/A 05/28/2017   Procedure: ESOPHAGOGASTRODUODENOSCOPY (EGD) WITH PROPOFOL;  Surgeon: Malissa Hippo, MD;  [facility];  Service: Endoscopy;  Laterality: N/A;  2:00  . HOLMIUM LASER APPLICATION Left 12/20/2014   Procedure: HOLMIUM LASER APPLICATION;  Surgeon: Barron Alvine, MD;  Location: [facility];  Service: Urology;  Laterality: Left;  . LAPAROSCOPIC ASSISTED VAGINAL HYSTERECTOMY  12-16-2006  . LAPAROSCOPIC NISSEN FUNDOPLICATION  Nov 2001   w/  CHOLECYSTECTOMY  . PORTACATH PLACEMENT N/A 07/12/2017   Procedure: INSERTION PORT-A-CATH;  Surgeon: Franky Macho, MD;  Location: [facility];  Service:  General;  Laterality: N/A;  . REPAIR PERFORATED VISCUS  POST WHIPPLE  x5    last one 06-23-2012  . TUBAL LIGATION    . WHIPPLE PROCEDURE  2011   at [facility]    Allergies  Allergen Reactions  . Buspar [Buspirone] Other (See Comments)    Dizziness  . Metoclopramide Hcl Other (See Comments)    "lock-jaw"    Current Outpatient Medications on File Prior to Visit  Medication Sig Dispense Refill  . Insulin Glargine (LANTUS SOLOSTAR) 100 UNIT/ML Solostar Pen Inject 48 Units into the skin at bedtime.     . insulin lispro (HUMALOG KWIKPEN) 100 UNIT/ML KiwkPen Inject 10-25 Units into the skin 4 (four) times daily -  with meals and at bedtime. Per sliding scale    . nortriptyline (PAMELOR) 25 MG capsule TAKE 4 CAPSULES BY MOUTH AT BEDTIME. 120 capsule 11  . ondansetron (ZOFRAN ODT) 4 MG disintegrating tablet Take 1 tablet (4 mg total) by mouth every 8 (eight) hours as needed for nausea or vomiting. 20 tablet 0  . oxyCODONE-acetaminophen (ROXICET) 5-325 MG tablet Take 1 tablet by mouth twice daily as needed for pain 60 tablet 0  . pantoprazole (PROTONIX) 40 MG tablet TAKE ONE TABLET BY MOUTH ONCE DAILY. 30 tablet 3  . pregabalin (LYRICA) 50 MG capsule Take 1 capsule by mouth 3 (three) times daily.    . promethazine (PHENERGAN) 50 MG tablet TAKE 1 TABLET BY MOUTH EVERY 6 HOURS AS NEEDED FOR NAUSEA OR VOMITING. 30 tablet 2  . zolpidem (AMBIEN) 10 MG tablet TAKE ONE TABLET BY MOUTH AT BEDTIME AS NEEDED FOR SLEEP. 30 tablet 5  . triamcinolone ointment (KENALOG) 0.5 % Apply 1 application topically 2 (two) times daily. (Patient not taking: Reported on 07/08/2018) 30 g 2   No current facility-administered medications on file prior to visit.         Objective:   Physical Exam Blood pressure 128/84, pulse 92, temperature 98.5 F (36.9 C), height 5\' 3"  (1.6 m), weight 141 lb 8 oz (64.2 kg). Alert and oriented. Skin warm and dry. Oral mucosa is moist.   . Sclera anicteric, conjunctivae is pink. Thyroid not  enlarged. No cervical lymphadenopathy. Lungs clear. Heart regular rate and rhythm.  Abdomen is soft. Bowel sounds are positive. No hepatomegaly. No abdominal masses felt. Tenderness to epigastric region and rt upper quadrant.  No edema to lower extremities.            Assessment & Plan:  Chronic pancreatitis. Pancreatic cyst. Am going to get a Lipase, Amylase, Hepatic, Follow up  MRI abdomen w/wo for her pancreatic cyst.

## 2018-07-09 LAB — HEPATIC FUNCTION PANEL
AG Ratio: 1.2 (calc) (ref 1.0–2.5)
ALT: 32 U/L — ABNORMAL HIGH (ref 6–29)
AST: 33 U/L (ref 10–35)
Albumin: 4.2 g/dL (ref 3.6–5.1)
Alkaline phosphatase (APISO): 117 U/L — ABNORMAL HIGH (ref 33–115)
Bilirubin, Direct: 0.2 mg/dL (ref 0.0–0.2)
Globulin: 3.4 g/dL (ref 1.9–3.7)
Indirect Bilirubin: 0.3 mg/dL (ref 0.2–1.2)
Total Bilirubin: 0.5 mg/dL (ref 0.2–1.2)
Total Protein: 7.6 g/dL (ref 6.1–8.1)

## 2018-07-09 LAB — AMYLASE: Amylase: 20 U/L — ABNORMAL LOW (ref 21–101)

## 2018-07-09 LAB — LIPASE

## 2018-07-11 ENCOUNTER — Ambulatory Visit (HOSPITAL_COMMUNITY)
Admission: RE | Admit: 2018-07-11 | Discharge: 2018-07-11 | Disposition: A | Discharge status: Home / Self Care | Payer: Medicare HMO | Source: Ambulatory Visit | Attending: Internal Medicine | Admitting: Internal Medicine

## 2018-07-11 ENCOUNTER — Other Ambulatory Visit (INDEPENDENT_AMBULATORY_CARE_PROVIDER_SITE_OTHER): Payer: Self-pay | Admitting: Internal Medicine

## 2018-07-11 DIAGNOSIS — K760 Fatty (change of) liver, not elsewhere classified: Secondary | ICD-10-CM | POA: Diagnosis not present

## 2018-07-11 DIAGNOSIS — K862 Cyst of pancreas: Secondary | ICD-10-CM

## 2018-07-11 DIAGNOSIS — K7689 Other specified diseases of liver: Secondary | ICD-10-CM | POA: Diagnosis not present

## 2018-07-11 MED ORDER — GADOBUTROL 1 MMOL/ML IV SOLN
7.0000 mL | Freq: Once | INTRAVENOUS | Status: AC | PRN
  Administered 2018-07-11: 7 mL via INTRAVENOUS

## 2018-07-14 ENCOUNTER — Other Ambulatory Visit: Payer: Self-pay | Admitting: Family Medicine

## 2018-07-23 ENCOUNTER — Encounter (HOSPITAL_COMMUNITY): Payer: Self-pay | Admitting: Emergency Medicine

## 2018-07-23 ENCOUNTER — Emergency Department (HOSPITAL_COMMUNITY)
Admission: EM | Admit: 2018-07-23 | Discharge: 2018-07-23 | Disposition: A | Discharge status: Home / Self Care | Payer: Medicare HMO | Attending: Emergency Medicine | Admitting: Emergency Medicine

## 2018-07-23 ENCOUNTER — Other Ambulatory Visit: Payer: Self-pay

## 2018-07-23 DIAGNOSIS — R1011 Right upper quadrant pain: Secondary | ICD-10-CM | POA: Insufficient documentation

## 2018-07-23 DIAGNOSIS — F419 Anxiety disorder, unspecified: Secondary | ICD-10-CM | POA: Diagnosis not present

## 2018-07-23 DIAGNOSIS — F319 Bipolar disorder, unspecified: Secondary | ICD-10-CM | POA: Diagnosis not present

## 2018-07-23 DIAGNOSIS — Z794 Long term (current) use of insulin: Secondary | ICD-10-CM | POA: Insufficient documentation

## 2018-07-23 DIAGNOSIS — E1165 Type 2 diabetes mellitus with hyperglycemia: Secondary | ICD-10-CM | POA: Diagnosis not present

## 2018-07-23 DIAGNOSIS — R1012 Left upper quadrant pain: Secondary | ICD-10-CM | POA: Diagnosis not present

## 2018-07-23 DIAGNOSIS — Z9049 Acquired absence of other specified parts of digestive tract: Secondary | ICD-10-CM | POA: Insufficient documentation

## 2018-07-23 DIAGNOSIS — R109 Unspecified abdominal pain: Secondary | ICD-10-CM

## 2018-07-23 DIAGNOSIS — R739 Hyperglycemia, unspecified: Secondary | ICD-10-CM

## 2018-07-23 LAB — URINALYSIS, ROUTINE W REFLEX MICROSCOPIC
Bilirubin Urine: NEGATIVE
Glucose, UA: 150 mg/dL — AB
Ketones, ur: NEGATIVE mg/dL
Leukocytes, UA: NEGATIVE
Nitrite: NEGATIVE
PROTEIN: NEGATIVE mg/dL
Specific Gravity, Urine: 1.015 (ref 1.005–1.030)
pH: 6 (ref 5.0–8.0)

## 2018-07-23 LAB — COMPREHENSIVE METABOLIC PANEL
ALBUMIN: 3.2 g/dL — AB (ref 3.5–5.0)
ALK PHOS: 109 U/L (ref 38–126)
ALT: 47 U/L — ABNORMAL HIGH (ref 0–44)
AST: 42 U/L — ABNORMAL HIGH (ref 15–41)
Anion gap: 6 (ref 5–15)
BUN: 9 mg/dL (ref 6–20)
CALCIUM: 8.6 mg/dL — AB (ref 8.9–10.3)
CO2: 24 mmol/L (ref 22–32)
CREATININE: 0.53 mg/dL (ref 0.44–1.00)
Chloride: 104 mmol/L (ref 98–111)
GFR calc Af Amer: >60 mL/min (ref >60)
GFR calc non Af Amer: >60 mL/min (ref >60)
Glucose, Bld: 243 mg/dL — ABNORMAL HIGH (ref 70–99)
Potassium: 3.7 mmol/L (ref 3.5–5.1)
SODIUM: 134 mmol/L — AB (ref 135–145)
Total Bilirubin: 0.5 mg/dL (ref 0.3–1.2)
Total Protein: 6.5 g/dL (ref 6.5–8.1)

## 2018-07-23 LAB — CBC
HCT: 38.3 % (ref 36.0–46.0)
Hemoglobin: 12.3 g/dL (ref 12.0–15.0)
MCH: 31.1 pg (ref 26.0–34.0)
MCHC: 32.1 g/dL (ref 30.0–36.0)
MCV: 96.7 fL (ref 80.0–100.0)
PLATELETS: 148 10*3/uL — AB (ref 150–400)
RBC: 3.96 MIL/uL (ref 3.87–5.11)
RDW: 12.8 % (ref 11.5–15.5)
WBC: 6.7 10*3/uL (ref 4.0–10.5)
nRBC: 0 % (ref 0.0–0.2)

## 2018-07-23 LAB — LIPASE, BLOOD: Lipase: 16 U/L (ref 11–51)

## 2018-07-23 MED ORDER — SODIUM CHLORIDE 0.9 % IV BOLUS
1000.0000 mL | Freq: Once | INTRAVENOUS | Status: AC
  Administered 2018-07-23: 1000 mL via INTRAVENOUS

## 2018-07-23 MED ORDER — HYDROMORPHONE HCL 1 MG/ML IJ SOLN
1.0000 mg | Freq: Once | INTRAMUSCULAR | Status: AC
  Administered 2018-07-23: 1 mg via INTRAVENOUS
  Filled 2018-07-23: qty 1

## 2018-07-23 MED ORDER — PROMETHAZINE HCL 25 MG/ML IJ SOLN
12.5000 mg | Freq: Once | INTRAMUSCULAR | Status: AC
  Administered 2018-07-23: 12.5 mg via INTRAVENOUS
  Filled 2018-07-23: qty 1

## 2018-07-23 MED ORDER — ONDANSETRON HCL 4 MG/2ML IJ SOLN
4.0000 mg | Freq: Once | INTRAMUSCULAR | Status: AC
  Administered 2018-07-23: 4 mg via INTRAVENOUS
  Filled 2018-07-23: qty 2

## 2018-07-23 MED ORDER — SODIUM CHLORIDE 0.9 % IV BOLUS: 1000.0000 mL | Freq: Once | INTRAVENOUS | Status: DC

## 2018-07-23 MED ORDER — HEPARIN SOD (PORK) LOCK FLUSH 100 UNIT/ML IV SOLN
INTRAVENOUS | Status: AC
  Administered 2018-07-23: 200 [IU] via INTRAVENOUS
  Filled 2018-07-23: qty 5

## 2018-07-23 NOTE — ED Provider Notes (Signed)
[facility] EMERGENCY DEPARTMENT Provider Note   CSN: [id number] Arrival date & time: 07/23/18  1453     History   Chief Complaint Chief Complaint  Patient presents with  . Abdominal Pain    HPI [name] [name] is a 47 y.o. female.  Right-sided abdominal pain which patient relates to chronic pancreatitis.  She is status post Whipple procedure in 2011 at [facility].  This pain feels like her normal flareup related pain.  No fever, sweats, chills, vomiting, diarrhea.  She takes oxycodone 5 mg 3 times daily on a regular basis.  She had an MRI 1 week ago which she describes as stable.  Severity of pain is moderate.  Nothing makes symptoms better or worse.     Past Medical History:  Diagnosis Date  . Acute bacterial bronchitis    dx 12-06-2014  --  FINISHED Z-PAC-  NO FEVER BUT STILL INTERMITTANT  PRODUCTIVE COUGH  . Anxiety   . Chronic pain    has pain specialist  . GERD (gastroesophageal reflux disease)   . History of chronic gastritis   . History of chronic pancreatitis    multple recurrent--  s/p  whipple 2011  . History of esophageal ulcer   . History of kidney stones   . History of subarachnoid hemorrhage    01-08-2013--  fell on concrete--  resolved without surgical intervention  . History of suicide attempt    hx multiple attempts by overdosing of meds  . IBS (irritable bowel syndrome)   . Left ureteral calculus   . Major depression, recurrent, chronic (HCC)    w/  Hx  multiple suicide attempts (overdose)  . Migraine headache   . Type 2 diabetes mellitus, uncontrolled (HCC)   . Whipple disease 2011    Patient Active Problem List   Diagnosis Date Noted  . Neuropathic pain 05/27/2018  . Epigastric pain 05/15/2017  . Abdominal pain, chronic, epigastric 05/09/2017  . Chronic pancreatitis (HCC) 08/19/2016  . Ureteral calculus 12/20/2014  . Insomnia 06/28/2014  . DM type 2 (diabetes mellitus, type 2) (HCC) 08/13/2013  . Urinary retention 08/13/2013  .  Abdominal pain 05/19/2013  . Diabetes 01/22/2013  . Subarachnoid bleed (HCC) 01/09/2013  . Fall involving sidewalk curb 01/09/2013  . Contusion 01/09/2013  . Pain management 12/25/2012  . Portal vein thrombosis 05/13/2012  . Lesion of liver 05/13/2012  . Constipation 05/13/2012  . BIPOLAR DISORDER UNSPECIFIED 02/03/2009  . Migraine 02/03/2009  . History of digestive system disease 02/03/2009  . Esophageal reflux 02/03/2009  . RENAL CALCULUS, HX OF 02/03/2009  . ANKLE SPRAIN 11/29/2008    Past Surgical History:  Procedure Laterality Date  . CESAREAN SECTION  x2  last one 02-29-2000   last one w/ BILATERAL TUBAL LIGATION  . COLONOSCOPY  03-14-2009   dx ileocolonscopy  . CYSTOSCOPY WITH RETROGRADE PYELOGRAM, URETEROSCOPY AND STENT PLACEMENT Left 12/20/2014   Procedure: CYSTOSCOPY, LEFT URETEROSCOPY, STONE REMOVAL, LASER LITHOTRIPSY;  Surgeon: Barron Alvine, MD;  Location: [facility];  Service: Urology;  Laterality: Left;  . ESOPHAGOGASTRODUODENOSCOPY  last  one 12-31-2008  . ESOPHAGOGASTRODUODENOSCOPY (EGD) WITH PROPOFOL N/A 05/28/2017   Procedure: ESOPHAGOGASTRODUODENOSCOPY (EGD) WITH PROPOFOL;  Surgeon: Malissa Hippo, MD;  [facility];  Service: Endoscopy;  Laterality: N/A;  2:00  . HOLMIUM LASER APPLICATION Left 12/20/2014   Procedure: HOLMIUM LASER APPLICATION;  Surgeon: Barron Alvine, MD;  Location: [facility];  Service: Urology;  Laterality: Left;  . LAPAROSCOPIC ASSISTED VAGINAL HYSTERECTOMY  12-16-2006  . LAPAROSCOPIC NISSEN FUNDOPLICATION  Nov 2001   w/  CHOLECYSTECTOMY  . PORTACATH PLACEMENT N/A 07/12/2017   Procedure: INSERTION PORT-A-CATH;  Surgeon: Franky Macho, MD;  Location: [facility];  Service: General;  Laterality: N/A;  . REPAIR PERFORATED VISCUS  POST WHIPPLE  x5    last one 06-23-2012  . TUBAL LIGATION    . WHIPPLE PROCEDURE  2011   at [facility] History    Gravida      Para      Term      Preterm      AB       Living  2     SAB      TAB      Ectopic      Multiple      Live Births               Home Medications    Prior to Admission medications   Medication Sig Start Date End Date Taking? Authorizing Provider  Insulin Glargine (LANTUS SOLOSTAR) 100 UNIT/ML Solostar Pen Inject 48 Units into the skin at bedtime.    Yes [provider]  insulin lispro (HUMALOG KWIKPEN) 100 UNIT/ML KiwkPen Inject 10-25 Units into the skin 4 (four) times daily -  with meals and at bedtime. Per sliding scale   Yes [provider]  nortriptyline (PAMELOR) 25 MG capsule TAKE 4 CAPSULES BY MOUTH AT BEDTIME. 05/27/18  Yes Merlyn Albert, MD  oxyCODONE (OXY IR/ROXICODONE) 5 MG immediate release tablet Take 1 tablet by mouth 3 (three) times daily. 07/04/18  Yes [provider]  pantoprazole (PROTONIX) 40 MG tablet TAKE ONE TABLET BY MOUTH ONCE DAILY. 03/18/18  Yes Setzer, Terri L, NP  pregabalin (LYRICA) 50 MG capsule Take 1 capsule by mouth 3 (three) times daily. 06/12/18  Yes [provider]  promethazine (PHENERGAN) 50 MG tablet TAKE 1 TABLET BY MOUTH EVERY 6 HOURS AS NEEDED FOR NAUSEA OR VOMITING. 11/08/17  Yes Merlyn Albert, MD  triamcinolone ointment (KENALOG) 0.5 % Apply 1 application topically 2 (two) times daily. 11/18/17  Yes Merlyn Albert, MD  zolpidem (AMBIEN) 10 MG tablet TAKE ONE TABLET BY MOUTH AT BEDTIME AS NEEDED FOR SLEEP. 05/21/18  Yes Merlyn Albert, MD  ondansetron (ZOFRAN ODT) 4 MG disintegrating tablet Take 1 tablet (4 mg total) by mouth every 8 (eight) hours as needed for nausea or vomiting. 04/29/17   Long, Arlyss Repress, MD    Family History Family History  Problem Relation Age of Onset  . Diabetes Father     Social History Social History   Tobacco Use  . Smoking status: Never Smoker  . Smokeless tobacco: Never Used  Substance Use Topics  . Alcohol use: No  . Drug use: Not Currently     Allergies   Buspar [buspirone] and Metoclopramide  hcl   Review of Systems Review of Systems  All other systems reviewed and are negative.    Physical Exam Updated Vital Signs BP 118/76 (BP Location: Left Arm)   Pulse 87   Temp 97.8 F (36.6 C) (Oral)   Resp 16   Ht 5\' 3"  (1.6 m)   Wt 63.5 kg   SpO2 97%   BMI 24.80 kg/m   Physical Exam  Constitutional: She is oriented to person, place, and time. She appears well-developed and well-nourished.  HENT:  Head: Normocephalic and atraumatic.  Eyes: Conjunctivae are normal.  Neck: Neck supple.  Cardiovascular:  Normal rate and regular rhythm.  Pulmonary/Chest: Effort normal and breath sounds normal.  Abdominal: Soft. Bowel sounds are normal.  Minimal right-sided abdominal tenderness.  No epigastric tenderness.  Musculoskeletal: Normal range of motion.  Neurological: She is alert and oriented to person, place, and time.  Skin: Skin is warm and dry.  Psychiatric: She has a normal mood and affect. Her behavior is normal.  Nursing note and vitals reviewed.    ED Treatments / Results  Labs (all labs ordered are listed, but only abnormal results are displayed) Labs Reviewed  COMPREHENSIVE METABOLIC PANEL - Abnormal; Notable for the following components:      Result Value   Sodium 134 (*)    Glucose, Bld 243 (*)    Calcium 8.6 (*)    Albumin 3.2 (*)    AST 42 (*)    ALT 47 (*)    All other components within normal limits  CBC - Abnormal; Notable for the following components:   Platelets 148 (*)    All other components within normal limits  URINALYSIS, ROUTINE W REFLEX MICROSCOPIC - Abnormal; Notable for the following components:   APPearance HAZY (*)    Glucose, UA 150 (*)    Hgb urine dipstick MODERATE (*)    Bacteria, UA FEW (*)    All other components within normal limits  LIPASE, BLOOD    EKG None  Radiology No results found.  Procedures Procedures (including critical care time)  Medications Ordered in ED Medications  sodium chloride 0.9 % bolus 1,000  mL (has no administration in time range)  ondansetron (ZOFRAN) injection 4 mg (4 mg Intravenous Given 07/23/18 1603)  sodium chloride 0.9 % bolus 1,000 mL (0 mLs Intravenous Stopped 07/23/18 1715)  HYDROmorphone (DILAUDID) injection 1 mg (1 mg Intravenous Given 07/23/18 1604)  HYDROmorphone (DILAUDID) injection 1 mg (1 mg Intravenous Given 07/23/18 1706)  promethazine (PHENERGAN) injection 12.5 mg (12.5 mg Intravenous Given 07/23/18 1739)  sodium chloride 0.9 % bolus 1,000 mL (0 mLs Intravenous Stopped 07/23/18 1905)  HYDROmorphone (DILAUDID) injection 1 mg (1 mg Intravenous Given 07/23/18 1922)  heparin lock flush 100 UNIT/ML injection (200 Units Intravenous Given 07/23/18 1951)     Initial Impression / Assessment and Plan / ED Course  I have reviewed the triage vital signs and the nursing notes.  Pertinent labs & imaging results that were available during my care of the patient were reviewed by me and considered in my medical decision making (see chart for details).     Patient presents with her typical right-sided abdominal pain.  She is nontoxic-appearing.  She feels much better after IV fluids and pain management.  CT scan was offered to her, but she preferred not to do this procedure.  No acute abdomen at discharge.  Final Clinical Impressions(s) / ED Diagnoses   Final diagnoses:  Abdominal pain, unspecified abdominal location  Hyperglycemia    ED Discharge Orders    None       Donnetta Hutching, MD 07/23/18 2030

## 2018-07-23 NOTE — ED Triage Notes (Signed)
Pt reports upper abdominal pain RUQ worse than LUQ x 3 days. Endorses nausea. States pain worsens after eating.

## 2018-07-23 NOTE — Discharge Instructions (Addendum)
Followup with your regular physicians. °

## 2018-08-01 DIAGNOSIS — G894 Chronic pain syndrome: Secondary | ICD-10-CM | POA: Diagnosis not present

## 2018-08-01 DIAGNOSIS — R109 Unspecified abdominal pain: Secondary | ICD-10-CM | POA: Diagnosis not present

## 2018-08-01 DIAGNOSIS — Z79899 Other long term (current) drug therapy: Secondary | ICD-10-CM | POA: Diagnosis not present

## 2018-08-01 DIAGNOSIS — G8929 Other chronic pain: Secondary | ICD-10-CM | POA: Diagnosis not present

## 2018-08-05 ENCOUNTER — Other Ambulatory Visit: Payer: Self-pay | Admitting: Family Medicine

## 2018-08-18 ENCOUNTER — Other Ambulatory Visit (INDEPENDENT_AMBULATORY_CARE_PROVIDER_SITE_OTHER): Payer: Self-pay | Admitting: Internal Medicine

## 2018-08-26 ENCOUNTER — Other Ambulatory Visit: Payer: Self-pay | Admitting: Family Medicine

## 2018-09-01 DIAGNOSIS — R109 Unspecified abdominal pain: Secondary | ICD-10-CM | POA: Diagnosis not present

## 2018-09-01 DIAGNOSIS — Z79899 Other long term (current) drug therapy: Secondary | ICD-10-CM | POA: Diagnosis not present

## 2018-09-01 DIAGNOSIS — G8929 Other chronic pain: Secondary | ICD-10-CM | POA: Diagnosis not present

## 2018-09-01 DIAGNOSIS — G894 Chronic pain syndrome: Secondary | ICD-10-CM | POA: Diagnosis not present

## 2018-09-12 ENCOUNTER — Other Ambulatory Visit: Payer: Self-pay | Admitting: Family Medicine

## 2018-09-16 DIAGNOSIS — K861 Other chronic pancreatitis: Secondary | ICD-10-CM | POA: Diagnosis not present

## 2018-09-26 ENCOUNTER — Other Ambulatory Visit: Payer: Self-pay | Admitting: Family Medicine

## 2018-09-26 DIAGNOSIS — G894 Chronic pain syndrome: Secondary | ICD-10-CM | POA: Diagnosis not present

## 2018-09-26 DIAGNOSIS — Z79899 Other long term (current) drug therapy: Secondary | ICD-10-CM | POA: Diagnosis not present

## 2018-09-26 DIAGNOSIS — G8929 Other chronic pain: Secondary | ICD-10-CM | POA: Diagnosis not present

## 2018-09-26 DIAGNOSIS — R109 Unspecified abdominal pain: Secondary | ICD-10-CM | POA: Diagnosis not present

## 2018-10-10 ENCOUNTER — Other Ambulatory Visit: Payer: Self-pay | Admitting: Family Medicine

## 2018-10-23 ENCOUNTER — Other Ambulatory Visit: Payer: Self-pay | Admitting: Family Medicine

## 2018-10-29 DIAGNOSIS — G894 Chronic pain syndrome: Secondary | ICD-10-CM | POA: Diagnosis not present

## 2018-10-29 DIAGNOSIS — Z79899 Other long term (current) drug therapy: Secondary | ICD-10-CM | POA: Diagnosis not present

## 2018-10-29 DIAGNOSIS — Z23 Encounter for immunization: Secondary | ICD-10-CM | POA: Diagnosis not present

## 2018-10-29 DIAGNOSIS — R109 Unspecified abdominal pain: Secondary | ICD-10-CM | POA: Diagnosis not present

## 2018-10-29 DIAGNOSIS — F1120 Opioid dependence, uncomplicated: Secondary | ICD-10-CM | POA: Diagnosis not present

## 2018-10-29 DIAGNOSIS — G8929 Other chronic pain: Secondary | ICD-10-CM | POA: Diagnosis not present

## 2018-11-05 ENCOUNTER — Other Ambulatory Visit: Payer: Self-pay | Admitting: Family Medicine

## 2018-11-06 NOTE — Telephone Encounter (Signed)
Six mo ok 

## 2018-11-12 DIAGNOSIS — Z90411 Acquired partial absence of pancreas: Secondary | ICD-10-CM | POA: Diagnosis not present

## 2018-11-12 DIAGNOSIS — Z9049 Acquired absence of other specified parts of digestive tract: Secondary | ICD-10-CM | POA: Diagnosis not present

## 2018-11-12 DIAGNOSIS — K861 Other chronic pancreatitis: Secondary | ICD-10-CM | POA: Diagnosis not present

## 2018-11-12 DIAGNOSIS — K760 Fatty (change of) liver, not elsewhere classified: Secondary | ICD-10-CM | POA: Diagnosis not present

## 2018-11-12 DIAGNOSIS — R1011 Right upper quadrant pain: Secondary | ICD-10-CM | POA: Diagnosis not present

## 2018-11-13 ENCOUNTER — Other Ambulatory Visit: Payer: Self-pay | Admitting: Family Medicine

## 2018-12-02 ENCOUNTER — Other Ambulatory Visit: Payer: Self-pay | Admitting: Family Medicine

## 2018-12-02 DIAGNOSIS — E109 Type 1 diabetes mellitus without complications: Secondary | ICD-10-CM | POA: Diagnosis not present

## 2018-12-02 DIAGNOSIS — Z79899 Other long term (current) drug therapy: Secondary | ICD-10-CM | POA: Diagnosis not present

## 2018-12-02 DIAGNOSIS — R109 Unspecified abdominal pain: Secondary | ICD-10-CM | POA: Diagnosis not present

## 2018-12-02 DIAGNOSIS — G894 Chronic pain syndrome: Secondary | ICD-10-CM | POA: Diagnosis not present

## 2018-12-02 DIAGNOSIS — F1120 Opioid dependence, uncomplicated: Secondary | ICD-10-CM | POA: Diagnosis not present

## 2018-12-02 DIAGNOSIS — G8929 Other chronic pain: Secondary | ICD-10-CM | POA: Diagnosis not present

## 2018-12-03 ENCOUNTER — Ambulatory Visit (INDEPENDENT_AMBULATORY_CARE_PROVIDER_SITE_OTHER): Payer: Medicare HMO | Admitting: Family Medicine

## 2018-12-03 ENCOUNTER — Encounter: Payer: Self-pay | Admitting: Family Medicine

## 2018-12-03 VITALS — BP 136/82 | Temp 98.4°F | Wt 150.2 lb

## 2018-12-03 DIAGNOSIS — M546 Pain in thoracic spine: Secondary | ICD-10-CM | POA: Diagnosis not present

## 2018-12-03 MED ORDER — ETODOLAC 400 MG PO TABS: 400.0000 mg | ORAL_TABLET | Freq: Two times a day (BID) | ORAL | 0 refills | Status: DC

## 2018-12-03 NOTE — Patient Instructions (Signed)

## 2018-12-03 NOTE — Progress Notes (Signed)
   Subjective:    Patient ID: [name] [name], female    DOB: [birth year], 48 y.o.   MRN: [id number]  Back Pain  This is a new problem. The current episode started 1 to 4 weeks ago. Pain location: in between shoulder blades. The quality of the pain is described as stabbing. The pain does not radiate. The pain is worse during the night. The symptoms are aggravated by bending (picking up things). Pertinent negatives include no chest pain, fever, numbness or weakness. Treatments tried: pt takes oxycodone for other pain. The treatment provided no relief.   Pt states her daughter said she had a "knot" kind of like a muscle spasm.   Reports mid upper back pain x 2 weeks, no known injury. Reports significant stress lately. No new activities or repetitive movements. Pain is worse on right side. Pain aggravated by lying down and being still because she is thinking about it - states sometimes better when she is moving around and not thinking about it. Pain alleviated by nothing. Has tried heating pad. Oxycodone not helping.   Review of Systems  Constitutional: Negative for chills, fever and unexpected weight change.  Respiratory: Negative for shortness of breath.   Cardiovascular: Negative for chest pain.  Musculoskeletal: Positive for back pain. Negative for gait problem.  Neurological: Negative for weakness and numbness.       Objective:   Physical Exam Vitals signs and nursing note reviewed.  Constitutional:      General: She is not in acute distress.    Appearance: Normal appearance. She is not toxic-appearing.  HENT:     Head: Normocephalic and atraumatic.  Neck:     Musculoskeletal: Full passive range of motion without pain and neck supple. No neck rigidity or spinous process tenderness.  Cardiovascular:     Rate and Rhythm: Normal rate and regular rhythm.     Heart sounds: Normal heart sounds.  Pulmonary:     Effort: Pulmonary effort is normal.     Breath sounds: Normal breath sounds.    Chest:     Chest wall: No tenderness.  Musculoskeletal:     Comments: Back: No deformity noted. No spinous process tenderness. Tenderness with palpation of paraspinal muscles of mid to upper back between scapula bilaterally. Pt reported back pain to this area with active ROM. No decreased ROM noted. Grip strength normal. Bilateral lower extremity strength normal. Gait normal.   Skin:    General: Skin is warm and dry.  Neurological:     Mental Status: She is alert and oriented to person, place, and time.  Psychiatric:        Behavior: Behavior normal.           Assessment & Plan:  Acute bilateral thoracic back pain  Discussed with patient likely musculoskeletal related. Imaging is not necessary at this time. Recommend anti-inflammatory medication for the next 1-2 weeks. Gentle stretching exercise. If symptoms not improving over the next couple weeks should f/u, may consider x-ray or PT referral at that time.   Dr. [name] was consulted on this case and is in agreement with the above treatment plan.

## 2018-12-15 ENCOUNTER — Ambulatory Visit (INDEPENDENT_AMBULATORY_CARE_PROVIDER_SITE_OTHER): Payer: Medicare HMO | Admitting: Family Medicine

## 2018-12-15 ENCOUNTER — Encounter: Payer: Self-pay | Admitting: Family Medicine

## 2018-12-15 VITALS — BP 116/88 | Temp 98.5°F | Ht 63.0 in | Wt 151.0 lb

## 2018-12-15 DIAGNOSIS — H6592 Unspecified nonsuppurative otitis media, left ear: Secondary | ICD-10-CM

## 2018-12-15 MED ORDER — AMOXICILLIN 500 MG PO CAPS: 500.0000 mg | ORAL_CAPSULE | Freq: Three times a day (TID) | ORAL | 0 refills | Status: AC

## 2018-12-15 NOTE — Progress Notes (Signed)
   Subjective:    Patient ID: [name] [name], female    DOB: 06-08-1971, 48 y.o.   MRN: [id number]  HPI  Patient is here today with left ear pain that started last night. States it felt "clogged" yesterday and tried using peroxide and a q-tip but woke up this morning with more of a constant "stabbing" pain. Reports hearing is muffled out of left ear. Reports some congestion x 1 week, no other URI symptoms, no fever.    Review of Systems  Constitutional: Negative for chills and fever.  HENT: Positive for congestion and ear pain. Negative for ear discharge and sore throat.   Eyes: Negative for discharge.  Respiratory: Negative for cough, shortness of breath and wheezing.        Objective:   Physical Exam Vitals signs and nursing note reviewed.  Constitutional:      General: She is not in acute distress.    Appearance: Normal appearance. She is not toxic-appearing.  HENT:     Head: Normocephalic and atraumatic.     Right Ear: Tympanic membrane normal.     Left Ear: Tenderness present. A middle ear effusion is present. Tympanic membrane is retracted.     Nose: Nose normal.     Mouth/Throat:     Mouth: Mucous membranes are moist.     Pharynx: Oropharynx is clear.  Eyes:     General:        Right eye: No discharge.        Left eye: No discharge.  Neck:     Musculoskeletal: Neck supple. No neck rigidity.  Cardiovascular:     Rate and Rhythm: Normal rate and regular rhythm.     Heart sounds: Normal heart sounds.  Pulmonary:     Effort: Pulmonary effort is normal. No respiratory distress.     Breath sounds: Normal breath sounds.  Lymphadenopathy:     Cervical: No cervical adenopathy.  Skin:    General: Skin is warm and dry.  Neurological:     Mental Status: She is alert and oriented to person, place, and time.  Psychiatric:        Behavior: Behavior normal.           Assessment & Plan:  Left otitis media with effusion  Will treat with amoxicillin. Discussed f/u if  symptoms worsen, develops fever, loses hearing, or notices drainage from ear or if symptoms fail to improve.   Dr. [name] was consulted on this case and is in agreement with the above treatment plan.

## 2018-12-17 ENCOUNTER — Encounter (HOSPITAL_COMMUNITY)
Admission: RE | Admit: 2018-12-17 | Discharge: 2018-12-17 | Disposition: A | Discharge status: Home / Self Care | Payer: Medicare HMO | Source: Ambulatory Visit | Attending: Family Medicine | Admitting: Family Medicine

## 2018-12-18 ENCOUNTER — Other Ambulatory Visit: Payer: Self-pay | Admitting: Family Medicine

## 2018-12-29 DIAGNOSIS — F1120 Opioid dependence, uncomplicated: Secondary | ICD-10-CM | POA: Diagnosis not present

## 2018-12-29 DIAGNOSIS — R109 Unspecified abdominal pain: Secondary | ICD-10-CM | POA: Diagnosis not present

## 2018-12-29 DIAGNOSIS — G8929 Other chronic pain: Secondary | ICD-10-CM | POA: Diagnosis not present

## 2018-12-29 DIAGNOSIS — G894 Chronic pain syndrome: Secondary | ICD-10-CM | POA: Diagnosis not present

## 2019-01-06 ENCOUNTER — Other Ambulatory Visit: Payer: Self-pay | Admitting: Family Medicine

## 2019-01-19 ENCOUNTER — Other Ambulatory Visit: Payer: Self-pay | Admitting: Family Medicine

## 2019-02-10 ENCOUNTER — Other Ambulatory Visit: Payer: Self-pay

## 2019-02-10 ENCOUNTER — Encounter: Payer: Self-pay | Admitting: Family Medicine

## 2019-02-10 ENCOUNTER — Ambulatory Visit (INDEPENDENT_AMBULATORY_CARE_PROVIDER_SITE_OTHER): Payer: Medicare Other | Admitting: Family Medicine

## 2019-02-10 DIAGNOSIS — N3000 Acute cystitis without hematuria: Secondary | ICD-10-CM | POA: Diagnosis not present

## 2019-02-10 MED ORDER — PROMETHAZINE HCL 50 MG PO TABS: ORAL_TABLET | ORAL | 0 refills | Status: DC

## 2019-02-10 MED ORDER — NITROFURANTOIN MONOHYD MACRO 100 MG PO CAPS: 100.0000 mg | ORAL_CAPSULE | Freq: Two times a day (BID) | ORAL | 0 refills | Status: DC

## 2019-02-10 NOTE — Progress Notes (Signed)
   Subjective:    Patient ID: [name] [name], female    DOB: 1971-07-27, 48 y.o.   MRN: [id number] Audio plus video HPI  Patient calls with pain in bladder and kidney area for 3 days . Patient reports no fever, dysuria or polyuria.  Virtual Visit via Video Note  I connected with [name] [name] on 02/10/19 at 11:00 AM EDT by a video enabled telemedicine application and verified that I am speaking with the correct person using two identifiers.  Location: Patient: home Provider: office   I discussed the limitations of evaluation and management by telemedicine and the availability of in person appointments. The patient expressed understanding and agreed to proceed.  History of Present Illness:    Observations/Objective:   Assessment and Plan:   Follow Up Instructions:    I discussed the assessment and treatment plan with the patient. The patient was provided an opportunity to ask questions and all were answered. The patient agreed with the plan and demonstrated an understanding of the instructions.   The patient was advised to call back or seek an in-person evaluation if the symptoms worsen or if the condition fails to improve as anticipated.  I provided * of non-face-to-face time during this encounter.   Patient notes pain in the flank.  Primarily right flank pain.  Some suprapubic discomfort.  Reminds her of a prior urinary tract infection.  No fever no chills.  No obvious dysuria or frequency.  Some nausea.  Review of Systems No rash no fever no vomiting    Objective:   Physical Exam  Virtual visit      Assessment & Plan:  Impression probable urinary tract infection.  With nausea and flank pain will cover for upper neurological disease.  Symptom care discussed antibiotics prescribed

## 2019-02-23 ENCOUNTER — Telehealth (INDEPENDENT_AMBULATORY_CARE_PROVIDER_SITE_OTHER): Payer: Self-pay | Admitting: Internal Medicine

## 2019-02-23 ENCOUNTER — Encounter (INDEPENDENT_AMBULATORY_CARE_PROVIDER_SITE_OTHER): Payer: Self-pay

## 2019-02-23 NOTE — Telephone Encounter (Signed)
[name] [name], patient sent me a text thru my chart she wants her labs an MRI sent to

## 2019-02-23 NOTE — Telephone Encounter (Signed)
[name] [name], patient wants her labs and MRI sent to Dr. Vertell Novak at [facility].

## 2019-02-24 NOTE — Telephone Encounter (Signed)
done

## 2019-03-03 ENCOUNTER — Telehealth: Payer: Self-pay | Admitting: Family Medicine

## 2019-03-03 NOTE — Telephone Encounter (Signed)
Prescription faxed to pharmacy. Patient notified. 

## 2019-03-03 NOTE — Telephone Encounter (Signed)
Pt needs Rx for 34mm pen tips sent to [facility] seeing a new endocrinologist 03/19/2019 & needs a few until she can see new specialist  Please advise & call pt when done    [facility]

## 2019-03-03 NOTE — Telephone Encounter (Signed)
ok 

## 2019-03-03 NOTE — Telephone Encounter (Signed)
rx in your folder if you agree to rx.

## 2019-03-05 ENCOUNTER — Other Ambulatory Visit: Payer: Self-pay | Admitting: Family Medicine

## 2019-03-24 ENCOUNTER — Other Ambulatory Visit: Payer: Self-pay | Admitting: Family Medicine

## 2019-04-21 ENCOUNTER — Telehealth: Payer: Self-pay | Admitting: Family Medicine

## 2019-04-21 ENCOUNTER — Other Ambulatory Visit: Payer: Self-pay | Admitting: Family Medicine

## 2019-04-21 NOTE — Telephone Encounter (Signed)
Please sign & date order for port flush & forward to [place] to be sent back  In yellow box on the wall

## 2019-04-21 NOTE — Telephone Encounter (Signed)
Faxed back signed & dated form

## 2019-04-21 NOTE — Telephone Encounter (Signed)
Six mo ok 

## 2019-04-24 ENCOUNTER — Other Ambulatory Visit: Payer: Self-pay | Admitting: Family Medicine

## 2019-04-24 ENCOUNTER — Other Ambulatory Visit: Payer: Self-pay

## 2019-04-24 ENCOUNTER — Encounter (HOSPITAL_COMMUNITY)
Admission: RE | Admit: 2019-04-24 | Discharge: 2019-04-24 | Disposition: A | Discharge status: Home / Self Care | Payer: Medicare HMO | Source: Ambulatory Visit | Attending: Family Medicine | Admitting: Family Medicine

## 2019-04-24 DIAGNOSIS — I81 Portal vein thrombosis: Secondary | ICD-10-CM | POA: Diagnosis not present

## 2019-04-24 DIAGNOSIS — K861 Other chronic pancreatitis: Secondary | ICD-10-CM | POA: Diagnosis not present

## 2019-04-24 MED ORDER — SODIUM CHLORIDE 0.9% FLUSH
10.0000 mL | INTRAVENOUS | Status: AC | PRN
  Administered 2019-04-24: 10 mL
  Filled 2019-04-24: qty 10

## 2019-04-24 MED ORDER — HEPARIN SOD (PORK) LOCK FLUSH 100 UNIT/ML IV SOLN
500.0000 [IU] | INTRAVENOUS | Status: AC | PRN
  Administered 2019-04-24: 500 [IU]
  Filled 2019-04-24: qty 5

## 2019-04-30 DIAGNOSIS — R109 Unspecified abdominal pain: Secondary | ICD-10-CM | POA: Diagnosis not present

## 2019-04-30 DIAGNOSIS — G894 Chronic pain syndrome: Secondary | ICD-10-CM | POA: Diagnosis not present

## 2019-04-30 DIAGNOSIS — Z760 Encounter for issue of repeat prescription: Secondary | ICD-10-CM | POA: Diagnosis not present

## 2019-04-30 DIAGNOSIS — G8929 Other chronic pain: Secondary | ICD-10-CM | POA: Diagnosis not present

## 2019-04-30 DIAGNOSIS — Z79899 Other long term (current) drug therapy: Secondary | ICD-10-CM | POA: Diagnosis not present

## 2019-05-07 ENCOUNTER — Other Ambulatory Visit: Payer: Self-pay | Admitting: Family Medicine

## 2019-05-19 DIAGNOSIS — R1013 Epigastric pain: Secondary | ICD-10-CM | POA: Diagnosis not present

## 2019-05-19 DIAGNOSIS — R140 Abdominal distension (gaseous): Secondary | ICD-10-CM | POA: Diagnosis not present

## 2019-05-19 DIAGNOSIS — E1042 Type 1 diabetes mellitus with diabetic polyneuropathy: Secondary | ICD-10-CM | POA: Diagnosis not present

## 2019-05-19 DIAGNOSIS — Z79899 Other long term (current) drug therapy: Secondary | ICD-10-CM | POA: Diagnosis not present

## 2019-05-19 DIAGNOSIS — Z794 Long term (current) use of insulin: Secondary | ICD-10-CM | POA: Diagnosis not present

## 2019-05-19 DIAGNOSIS — E088 Diabetes mellitus due to underlying condition with unspecified complications: Secondary | ICD-10-CM | POA: Diagnosis not present

## 2019-05-19 DIAGNOSIS — K861 Other chronic pancreatitis: Secondary | ICD-10-CM | POA: Diagnosis not present

## 2019-05-19 DIAGNOSIS — R110 Nausea: Secondary | ICD-10-CM | POA: Diagnosis not present

## 2019-05-19 DIAGNOSIS — Z90411 Acquired partial absence of pancreas: Secondary | ICD-10-CM | POA: Diagnosis not present

## 2019-05-22 DIAGNOSIS — Z20828 Contact with and (suspected) exposure to other viral communicable diseases: Secondary | ICD-10-CM | POA: Diagnosis not present

## 2019-05-22 DIAGNOSIS — Z01812 Encounter for preprocedural laboratory examination: Secondary | ICD-10-CM | POA: Diagnosis not present

## 2019-05-25 DIAGNOSIS — Z980 Intestinal bypass and anastomosis status: Secondary | ICD-10-CM | POA: Diagnosis not present

## 2019-05-25 DIAGNOSIS — K219 Gastro-esophageal reflux disease without esophagitis: Secondary | ICD-10-CM | POA: Diagnosis not present

## 2019-05-25 DIAGNOSIS — E1042 Type 1 diabetes mellitus with diabetic polyneuropathy: Secondary | ICD-10-CM | POA: Diagnosis not present

## 2019-05-25 DIAGNOSIS — K760 Fatty (change of) liver, not elsewhere classified: Secondary | ICD-10-CM | POA: Diagnosis not present

## 2019-05-25 DIAGNOSIS — Z934 Other artificial openings of gastrointestinal tract status: Secondary | ICD-10-CM | POA: Diagnosis not present

## 2019-05-25 DIAGNOSIS — E109 Type 1 diabetes mellitus without complications: Secondary | ICD-10-CM | POA: Diagnosis not present

## 2019-05-25 DIAGNOSIS — R1084 Generalized abdominal pain: Secondary | ICD-10-CM | POA: Diagnosis not present

## 2019-05-25 DIAGNOSIS — R1013 Epigastric pain: Secondary | ICD-10-CM | POA: Diagnosis not present

## 2019-05-25 DIAGNOSIS — F319 Bipolar disorder, unspecified: Secondary | ICD-10-CM | POA: Diagnosis not present

## 2019-05-25 DIAGNOSIS — G8929 Other chronic pain: Secondary | ICD-10-CM | POA: Diagnosis not present

## 2019-05-25 DIAGNOSIS — R110 Nausea: Secondary | ICD-10-CM | POA: Diagnosis not present

## 2019-05-27 ENCOUNTER — Other Ambulatory Visit: Payer: Self-pay | Admitting: Family Medicine

## 2019-05-28 ENCOUNTER — Other Ambulatory Visit: Payer: Self-pay | Admitting: Family Medicine

## 2019-05-28 MED ORDER — PROMETHAZINE HCL 50 MG PO TABS: ORAL_TABLET | ORAL | 0 refills | Status: DC

## 2019-06-05 ENCOUNTER — Encounter (HOSPITAL_COMMUNITY): Admission: RE | Admit: 2019-06-05 | Payer: Medicare HMO | Source: Ambulatory Visit

## 2019-06-10 DIAGNOSIS — R1084 Generalized abdominal pain: Secondary | ICD-10-CM | POA: Diagnosis not present

## 2019-06-10 DIAGNOSIS — E088 Diabetes mellitus due to underlying condition with unspecified complications: Secondary | ICD-10-CM | POA: Diagnosis not present

## 2019-06-10 DIAGNOSIS — K861 Other chronic pancreatitis: Secondary | ICD-10-CM | POA: Diagnosis not present

## 2019-06-10 DIAGNOSIS — Z760 Encounter for issue of repeat prescription: Secondary | ICD-10-CM | POA: Diagnosis not present

## 2019-06-10 DIAGNOSIS — G8929 Other chronic pain: Secondary | ICD-10-CM | POA: Diagnosis not present

## 2019-06-10 DIAGNOSIS — Z79899 Other long term (current) drug therapy: Secondary | ICD-10-CM | POA: Diagnosis not present

## 2019-06-10 DIAGNOSIS — R110 Nausea: Secondary | ICD-10-CM | POA: Diagnosis not present

## 2019-06-10 DIAGNOSIS — G894 Chronic pain syndrome: Secondary | ICD-10-CM | POA: Diagnosis not present

## 2019-06-10 DIAGNOSIS — R109 Unspecified abdominal pain: Secondary | ICD-10-CM | POA: Diagnosis not present

## 2019-06-10 DIAGNOSIS — E1042 Type 1 diabetes mellitus with diabetic polyneuropathy: Secondary | ICD-10-CM | POA: Diagnosis not present

## 2019-06-11 ENCOUNTER — Encounter (HOSPITAL_COMMUNITY): Admission: RE | Admit: 2019-06-11 | Payer: Medicare HMO | Source: Ambulatory Visit

## 2019-06-12 ENCOUNTER — Encounter (HOSPITAL_COMMUNITY)
Admission: RE | Admit: 2019-06-12 | Discharge: 2019-06-12 | Disposition: A | Discharge status: Home / Self Care | Payer: Medicare HMO | Source: Ambulatory Visit | Attending: Family Medicine | Admitting: Family Medicine

## 2019-06-16 ENCOUNTER — Other Ambulatory Visit: Payer: Self-pay | Admitting: Family Medicine

## 2019-06-26 ENCOUNTER — Emergency Department (HOSPITAL_COMMUNITY): Payer: Medicare HMO

## 2019-06-26 ENCOUNTER — Encounter (HOSPITAL_COMMUNITY): Payer: Self-pay

## 2019-06-26 ENCOUNTER — Emergency Department (HOSPITAL_COMMUNITY)
Admission: EM | Admit: 2019-06-26 | Discharge: 2019-06-26 | Disposition: A | Discharge status: Home / Self Care | Payer: Medicare HMO | Attending: Emergency Medicine | Admitting: Emergency Medicine

## 2019-06-26 ENCOUNTER — Other Ambulatory Visit: Payer: Self-pay

## 2019-06-26 DIAGNOSIS — R1013 Epigastric pain: Secondary | ICD-10-CM | POA: Insufficient documentation

## 2019-06-26 DIAGNOSIS — R110 Nausea: Secondary | ICD-10-CM | POA: Diagnosis not present

## 2019-06-26 DIAGNOSIS — N200 Calculus of kidney: Secondary | ICD-10-CM | POA: Diagnosis not present

## 2019-06-26 DIAGNOSIS — E119 Type 2 diabetes mellitus without complications: Secondary | ICD-10-CM | POA: Diagnosis not present

## 2019-06-26 LAB — COMPREHENSIVE METABOLIC PANEL
ALT: 38 U/L (ref 0–44)
AST: 33 U/L (ref 15–41)
Albumin: 3.1 g/dL — ABNORMAL LOW (ref 3.5–5.0)
Alkaline Phosphatase: 101 U/L (ref 38–126)
Anion gap: 8 (ref 5–15)
BUN: 13 mg/dL (ref 6–20)
CO2: 22 mmol/L (ref 22–32)
Calcium: 8.5 mg/dL — ABNORMAL LOW (ref 8.9–10.3)
Chloride: 104 mmol/L (ref 98–111)
Creatinine, Ser: 0.58 mg/dL (ref 0.44–1.00)
GFR calc Af Amer: >60 mL/min (ref >60)
GFR calc non Af Amer: >60 mL/min (ref >60)
Glucose, Bld: 284 mg/dL — ABNORMAL HIGH (ref 70–99)
Potassium: 4.3 mmol/L (ref 3.5–5.1)
Sodium: 134 mmol/L — ABNORMAL LOW (ref 135–145)
Total Bilirubin: 0.1 mg/dL — ABNORMAL LOW (ref 0.3–1.2)
Total Protein: 6.1 g/dL — ABNORMAL LOW (ref 6.5–8.1)

## 2019-06-26 LAB — LIPASE, BLOOD: Lipase: 14 U/L (ref 11–51)

## 2019-06-26 LAB — CBC WITH DIFFERENTIAL/PLATELET
Abs Immature Granulocytes: 0.01 10*3/uL (ref 0.00–0.07)
Basophils Absolute: 0 10*3/uL (ref 0.0–0.1)
Basophils Relative: 0 %
Eosinophils Absolute: 0 10*3/uL (ref 0.0–0.5)
Eosinophils Relative: 1 %
HCT: 39.9 % (ref 36.0–46.0)
Hemoglobin: 13 g/dL (ref 12.0–15.0)
Immature Granulocytes: 0 %
Lymphocytes Relative: 36 %
Lymphs Abs: 1.8 10*3/uL (ref 0.7–4.0)
MCH: 33.6 pg (ref 26.0–34.0)
MCHC: 32.6 g/dL (ref 30.0–36.0)
MCV: 103.1 fL — ABNORMAL HIGH (ref 80.0–100.0)
Monocytes Absolute: 0.3 10*3/uL (ref 0.1–1.0)
Monocytes Relative: 5 %
Neutro Abs: 2.9 10*3/uL (ref 1.7–7.7)
Neutrophils Relative %: 58 %
Platelets: 159 10*3/uL (ref 150–400)
RBC: 3.87 MIL/uL (ref 3.87–5.11)
RDW: 12.3 % (ref 11.5–15.5)
WBC: 5 10*3/uL (ref 4.0–10.5)
nRBC: 0 % (ref 0.0–0.2)

## 2019-06-26 LAB — URINALYSIS, ROUTINE W REFLEX MICROSCOPIC
Bilirubin Urine: NEGATIVE
Glucose, UA: 150 mg/dL — AB
Ketones, ur: NEGATIVE mg/dL
Leukocytes,Ua: NEGATIVE
Nitrite: NEGATIVE
Protein, ur: NEGATIVE mg/dL
Specific Gravity, Urine: 1.017 (ref 1.005–1.030)
pH: 5 (ref 5.0–8.0)

## 2019-06-26 MED ORDER — IOHEXOL 300 MG/ML  SOLN
100.0000 mL | Freq: Once | INTRAMUSCULAR | Status: AC | PRN
  Administered 2019-06-26: 100 mL via INTRAVENOUS

## 2019-06-26 MED ORDER — SODIUM CHLORIDE 0.9 % IV BOLUS
1000.0000 mL | Freq: Once | INTRAVENOUS | Status: AC
  Administered 2019-06-26: 1000 mL via INTRAVENOUS

## 2019-06-26 MED ORDER — HEPARIN SOD (PORK) LOCK FLUSH 100 UNIT/ML IV SOLN
500.0000 [IU] | Freq: Once | INTRAVENOUS | Status: DC
  Filled 2019-06-26: qty 5

## 2019-06-26 MED ORDER — MORPHINE SULFATE (PF) 4 MG/ML IV SOLN
4.0000 mg | Freq: Once | INTRAVENOUS | Status: AC
  Administered 2019-06-26: 4 mg via INTRAVENOUS
  Filled 2019-06-26: qty 1

## 2019-06-26 MED ORDER — PROMETHAZINE HCL 25 MG/ML IJ SOLN
12.5000 mg | Freq: Once | INTRAMUSCULAR | Status: AC
  Administered 2019-06-26: 12.5 mg via INTRAVENOUS
  Filled 2019-06-26: qty 1

## 2019-06-26 NOTE — ED Provider Notes (Signed)
Emergency Department Provider Note   I have reviewed the triage vital signs and the nursing notes.   HISTORY  Chief Complaint Abdominal Pain   HPI [name] [name] is a 48 y.o. female with PMH of chronic pancreatitis/gastritis s/p [facility]ipple at [facility], DM, and recurrent depression presents to the ED with severe epigastric and right upper quadrant abdominal pain with associated nausea.  Patient is not experiencing any vomiting or diarrhea.  She does have bowel movements.  She states she is able to eat and drink but has severe pain with doing so.  She denies fevers or chills.  Symptoms have been ongoing for the past 3 days and appear to be worsening.  Patient has 5 mg oxycodone at home which she has been taking with no relief in symptoms.  She called and discussed symptoms with [facility] at [facility] this morning and was referred to the local emergency department for CT scan, fluids, pain medications.  Patient denies any chest pain, shortness of breath, URI symptoms.  Denies any UTI symptoms.  Past Medical History:  Diagnosis Date   Acute bacterial bronchitis    dx 12-06-2014  --  FINISHED Z-PAC-  NO FEVER BUT STILL INTERMITTANT  PRODUCTIVE COUGH   Anxiety    Chronic pain    has pain specialist   GERD (gastroesophageal reflux disease)    History of chronic gastritis    History of chronic pancreatitis    multple recurrent--  s/p  whipple 2011   History of esophageal ulcer    History of kidney stones    History of subarachnoid hemorrhage    01-08-2013--  fell on concrete--  resolved without surgical intervention   History of suicide attempt    hx multiple attempts by overdosing of meds   IBS (irritable bowel syndrome)    Left ureteral calculus    Major depression, recurrent, chronic (HCC)    w/  Hx  multiple suicide attempts (overdose)   Migraine headache    Type 2 diabetes mellitus, uncontrolled (HCC)    Whipple disease 2011    Patient Active Problem  List   Diagnosis Date Noted   Neuropathic pain 05/27/2018   Epigastric pain 05/15/2017   Abdominal pain, chronic, epigastric 05/09/2017   Chronic pancreatitis (HCC) 08/19/2016   Ureteral calculus 12/20/2014   Insomnia 06/28/2014   DM type 2 (diabetes mellitus, type 2) (HCC) 08/13/2013   Urinary retention 08/13/2013   Abdominal pain 05/19/2013   Diabetes 01/22/2013   Subarachnoid bleed (HCC) 01/09/2013   Fall involving sidewalk curb 01/09/2013   Contusion 01/09/2013   Pain management 12/25/2012   Portal vein thrombosis 05/13/2012   Lesion of liver 05/13/2012   Constipation 05/13/2012   BIPOLAR DISORDER UNSPECIFIED 02/03/2009   Migraine 02/03/2009   History of digestive system disease 02/03/2009   Esophageal reflux 02/03/2009   RENAL CALCULUS, HX OF 02/03/2009   ANKLE SPRAIN 11/29/2008    Past Surgical History:  Procedure Laterality Date   CESAREAN SECTION  x2  last one 02-29-2000   last one w/ BILATERAL TUBAL LIGATION   COLONOSCOPY  03-14-2009   dx ileocolonscopy   CYSTOSCOPY WITH RETROGRADE PYELOGRAM, URETEROSCOPY AND STENT PLACEMENT Left 12/20/2014   Procedure: CYSTOSCOPY, LEFT URETEROSCOPY, STONE REMOVAL, LASER LITHOTRIPSY;  Surgeon: Barron Alvineavid Grapey, MD;  [facility];  Service: Urology;  Laterality: Left;   ESOPHAGOGASTRODUODENOSCOPY  last  one 12-31-2008   ESOPHAGOGASTRODUODENOSCOPY (EGD) WITH PROPOFOL N/A 05/28/2017   Procedure: ESOPHAGOGASTRODUODENOSCOPY (EGD) WITH PROPOFOL;  Surgeon: Karilyn Cotaehman,  Joline Maxcy, MD;  Location: [facility];  Service: Endoscopy;  Laterality: N/A;  2:00   HOLMIUM LASER APPLICATION Left 12/20/2014   Procedure: HOLMIUM LASER APPLICATION;  Surgeon: Barron Alvine, MD;  Location: [facility];  Service: Urology;  Laterality: Left;   LAPAROSCOPIC ASSISTED VAGINAL HYSTERECTOMY  12-16-2006   LAPAROSCOPIC NISSEN FUNDOPLICATION  Nov 2001   w/  CHOLECYSTECTOMY   PORTACATH PLACEMENT N/A  07/12/2017   Procedure: INSERTION PORT-A-CATH;  Surgeon: Franky Macho, MD;  Location: [facility];  Service: General;  Laterality: N/A;   REPAIR PERFORATED VISCUS  POST WHIPPLE  x5    last one 06-23-2012   TUBAL LIGATION     WHIPPLE PROCEDURE  2011   at [facility]    Allergies Buspar [buspirone] and Metoclopramide hcl  Family History  Problem Relation Age of Onset   Diabetes Father     Social History Social History   Tobacco Use   Smoking status: Never Smoker   Smokeless tobacco: Never Used  Substance Use Topics   Alcohol use: No   Drug use: Not Currently    Review of Systems  Constitutional: No fever/chills Eyes: No visual changes. ENT: No sore throat. Cardiovascular: Denies chest pain. Respiratory: Denies shortness of breath. Gastrointestinal: Positive epigastric and RUQ abdominal pain. Positive nausea, no vomiting.  No diarrhea.  No constipation. Genitourinary: Negative for dysuria. Musculoskeletal: Negative for back pain. Skin: Negative for rash. Neurological: Negative for headaches, focal weakness or numbness.  10-point ROS otherwise negative.  ____________________________________________   PHYSICAL EXAM:  VITAL SIGNS: ED Triage Vitals  Enc Vitals Group     BP 06/26/19 0932 135/88     Pulse Rate 06/26/19 0932 100     Resp 06/26/19 0932 17     Temp 06/26/19 0932 98.3 F (36.8 C)     Temp Source 06/26/19 0932 Oral     SpO2 06/26/19 0932 100 %     Weight 06/26/19 0931 145 lb (65.8 kg)     Height 06/26/19 0931 5\' 3"  (1.6 m)   Constitutional: Alert and oriented. Well appearing and in no acute distress. Eyes: Conjunctivae are normal.  Head: Atraumatic. Nose: No congestion/rhinnorhea. Mouth/Throat: Mucous membranes are moist. Neck: No stridor.  Cardiovascular: Normal rate, regular rhythm. Good peripheral circulation. Grossly normal heart sounds.   Respiratory: Normal respiratory effort.  No retractions. Lungs CTAB. Gastrointestinal: Soft with tenderness  in the epigastric and RUQ quadrants. No rebound or guarding. No distention.  Musculoskeletal: No lower extremity tenderness nor edema. No gross deformities of extremities. Neurologic:  Normal speech and language.  Skin:  Skin is warm, dry and intact. No rash noted.   ____________________________________________   LABS (all labs ordered are listed, but only abnormal results are displayed)  Labs Reviewed  COMPREHENSIVE METABOLIC PANEL - Abnormal; Notable for the following components:      Result Value   Sodium 134 (*)    Glucose, Bld 284 (*)    Calcium 8.5 (*)    Total Protein 6.1 (*)    Albumin 3.1 (*)    Total Bilirubin 0.1 (*)    All other components within normal limits  CBC WITH DIFFERENTIAL/PLATELET - Abnormal; Notable for the following components:   MCV 103.1 (*)    All other components within normal limits  URINALYSIS, ROUTINE W REFLEX MICROSCOPIC - Abnormal; Notable for the following components:   Glucose, UA 150 (*)    Hgb urine dipstick SMALL (*)    Bacteria, UA RARE (*)    All  other components within normal limits  LIPASE, BLOOD   ____________________________________________  RADIOLOGY  Ct Abdomen Pelvis W Contrast  Result Date: 06/26/2019 CLINICAL DATA:  Upper abdominal pain, primarily right-sided. History of previous Whipple procedure. EXAM: CT ABDOMEN AND PELVIS WITH CONTRAST TECHNIQUE: Multidetector CT imaging of the abdomen and pelvis was performed using the standard protocol following bolus administration of intravenous contrast. CONTRAST:  100mL OMNIPAQUE IOHEXOL 300 MG/ML  SOLN COMPARISON:  Abdominal MRI July 11, 2018; CT abdomen and pelvis April 29, 2017. CT abdomen and pelvis February 08/28/2015 FINDINGS: Lower chest: No lung base edema or consolidation. Hepatobiliary: There is diffuse hepatic steatosis. Gallbladder absent. There is pneumobilia consistent with previous Whipple procedure. No intrinsic liver lesions evident. There is a persistent fluid  collection along the anterior aspect of the liver measuring 4.3 x 3.3 cm, a likely liquified hematoma or possible lymphocele. No biliary duct dilatation given post cholecystectomy state. No biliary duct mass or calculus evident by CT. Pancreas: Patient has had partial pancreatectomy. Occasional calcifications are noted in the pancreatic body which could represent residual pancreatic inflammation. Currently there is no pancreatic mass or duct dilatation. No peripancreatic fluid evident. Spleen: No splenic lesions are appreciable. Surgical clips are noted adjacent to the medial posterior spleen, stable. Adrenals/Urinary Tract: Adrenals appear unremarkable bilaterally. Kidneys bilaterally show no evident mass or hydronephrosis on either side. There is a 3 x 2 mm calculus in the mid right kidney. There is a 2 mm calculus in the mid left kidney as well as a 2 mm calculus in the lower pole left kidney. There is no ureteral calculus on either side. Urinary bladder is midline with wall thickness within normal limits. Stomach/Bowel: There is diffuse stool throughout the colon. Colon is borderline distended due to stool. There is no bowel wall thickening on this study. There is postoperative change in the mid abdomen with evidence of previous choledocho-jejunostomy as part of Whipple procedure. A portion of the stomach has also been removed with anastomosis patent. No bowel obstruction is demonstrable on this study. Terminal ileum appears normal. There is no free air or portal venous air. Vascular/Lymphatic: There is no abdominal aortic aneurysm. No vascular lesions are evident. There are multiple mesenteric lymph nodes throughout the mid abdomen which likely is secondary to the previous surgery. Largest lymph node in the mesenteric region is borderline prominent with a short axis diameter of 1.0 cm. No larger lymph nodes elsewhere. Reproductive: Uterus absent. No pelvic mass evident. Appendix appears normal. Other: There is  a stable mass located between the cecum and the right psoas muscle measuring 5.4 x 3.5 x 2.6 cm. There are calcifications within this mass. This mass is separate from the appendix. Appendix appears normal. No ascites or abscess evident in the abdomen pelvis. There is soft tissue stranding in the mid abdominal region in the midmesenteric region. There is a focal midline ventral hernia which contains a loop of transverse colon but no bowel compromise. A prominent vascular structure extends into this ventral hernia as well. This appearance is similar to the 2018 study. Musculoskeletal: No blastic or lytic bone lesions evident. No intramuscular lesions evident. IMPRESSION: 1. Changes associated with previous Whipple procedure. Respective anastomoses patent. Pneumobilia is felt to be secondary to the previous choledochojejunostomy. 2. Essentially fluid collection along the anterior aspect of the liver, likely liquified hematoma or possible lymphocele. 3. Mesenteric lymph node prominence likely a sequelae of the previous surgery. There is mesenteric stranding in this area. There is possible degree of sclerosing  mesenteritis in this area. 4. Stable appearing Richter type hernia along the anterior abdominal wall containing a portion of a loop of transverse colon. A prominent venous collateral extending from the left abdomen extends into this area, a stable finding. 5. Stable partially calcified mass in the right upper pelvis measuring 5.4 x 3.5 x 2.6 cm. This mass may represent an ovarian remnant, possibly with associated dermoid. Stability over time is indicative of benign etiology for this lesion. 6. Diffuse stool throughout the colon, likely indicative of constipation. There may be a degree of superimposed colonic ileus. There is no evident bowel obstruction. 7. No abscess in the abdomen or pelvis. Appendix region appears normal. 8. Small nonobstructing calculus in each kidney. No hydronephrosis or ureteral calculus on  either side. Urinary bladder wall thickness within normal limits. Electronically Signed   By: Lowella Grip III M.D.   On: 06/26/2019 12:03    ____________________________________________   PROCEDURES  Procedure(s) performed:   Procedures  None ____________________________________________   INITIAL IMPRESSION / ASSESSMENT AND PLAN / ED COURSE  Pertinent labs & imaging results that were available during my care of the patient were reviewed by me and considered in my medical decision making (see chart for details).   Patient with 3 days of epigastric and right upper quadrant abdominal pain.  She has associated nausea.  She is followed primarily at [facility] by gastroenterology.  She is here after telephone call with her GI doc this morning requesting CT imaging, labs, fluids, pain medication.  Exam is overall reassuring.  Vital signs are unremarkable.  Plan for CT imaging, labs, pain control.   No acute findings on CT. Labs reviewed. Patient feeling improved with pain meds and IVF. Plan for discharge with outpatient GI follow up.  ____________________________________________  FINAL CLINICAL IMPRESSION(S) / ED DIAGNOSES  Final diagnoses:  Epigastric pain  Nausea     MEDICATIONS GIVEN DURING THIS VISIT:  Medications  sodium chloride 0.9 % bolus 1,000 mL (0 mLs Intravenous Stopped 06/26/19 1234)  morphine 4 MG/ML injection 4 mg (4 mg Intravenous Given 06/26/19 1036)  promethazine (PHENERGAN) injection 12.5 mg (12.5 mg Intravenous Given 06/26/19 1125)  iohexol (OMNIPAQUE) 300 MG/ML solution 100 mL (100 mLs Intravenous Contrast Given 06/26/19 1112)    Note:  This document was prepared using Dragon voice recognition software and may include unintentional dictation errors.  Nanda Quinton, MD, [facility] Emergency Medicine    Aliou Mealey, [facility], MD 06/26/19 1945

## 2019-06-26 NOTE — ED Triage Notes (Signed)
Pt c/o ruq pain x 3 days.  Reports she has history of whipple procedure and has a GI doctor at [facility].  Says was told to come to ed for fluids, ct scan, and pain control.   Denies any n/v.

## 2019-06-26 NOTE — Discharge Instructions (Signed)

## 2019-06-30 ENCOUNTER — Other Ambulatory Visit: Payer: Self-pay | Admitting: Family Medicine

## 2019-07-15 ENCOUNTER — Other Ambulatory Visit: Payer: Self-pay | Admitting: Family Medicine

## 2019-07-15 DIAGNOSIS — G8929 Other chronic pain: Secondary | ICD-10-CM | POA: Diagnosis not present

## 2019-07-15 DIAGNOSIS — Z1159 Encounter for screening for other viral diseases: Secondary | ICD-10-CM | POA: Diagnosis not present

## 2019-07-15 DIAGNOSIS — R109 Unspecified abdominal pain: Secondary | ICD-10-CM | POA: Diagnosis not present

## 2019-07-15 DIAGNOSIS — Z23 Encounter for immunization: Secondary | ICD-10-CM | POA: Diagnosis not present

## 2019-07-15 DIAGNOSIS — Z79899 Other long term (current) drug therapy: Secondary | ICD-10-CM | POA: Diagnosis not present

## 2019-07-15 DIAGNOSIS — G894 Chronic pain syndrome: Secondary | ICD-10-CM | POA: Diagnosis not present

## 2019-08-04 DIAGNOSIS — R110 Nausea: Secondary | ICD-10-CM | POA: Diagnosis not present

## 2019-08-04 DIAGNOSIS — R1013 Epigastric pain: Secondary | ICD-10-CM | POA: Diagnosis not present

## 2019-08-12 DIAGNOSIS — G894 Chronic pain syndrome: Secondary | ICD-10-CM | POA: Diagnosis not present

## 2019-08-12 DIAGNOSIS — G8929 Other chronic pain: Secondary | ICD-10-CM | POA: Diagnosis not present

## 2019-08-12 DIAGNOSIS — R109 Unspecified abdominal pain: Secondary | ICD-10-CM | POA: Diagnosis not present

## 2019-08-12 DIAGNOSIS — Z79899 Other long term (current) drug therapy: Secondary | ICD-10-CM | POA: Diagnosis not present

## 2019-09-11 DIAGNOSIS — R109 Unspecified abdominal pain: Secondary | ICD-10-CM | POA: Diagnosis not present

## 2019-09-11 DIAGNOSIS — G894 Chronic pain syndrome: Secondary | ICD-10-CM | POA: Diagnosis not present

## 2019-09-11 DIAGNOSIS — G8929 Other chronic pain: Secondary | ICD-10-CM | POA: Diagnosis not present

## 2019-09-11 DIAGNOSIS — Z79899 Other long term (current) drug therapy: Secondary | ICD-10-CM | POA: Diagnosis not present

## 2019-09-23 ENCOUNTER — Other Ambulatory Visit: Payer: Self-pay | Admitting: Family Medicine

## 2019-09-24 NOTE — Telephone Encounter (Signed)
Last med check 05/27/2018

## 2019-09-25 ENCOUNTER — Other Ambulatory Visit: Payer: Self-pay | Admitting: Family Medicine

## 2019-09-25 NOTE — Telephone Encounter (Signed)
6 mo 

## 2019-09-28 ENCOUNTER — Other Ambulatory Visit: Payer: Self-pay | Admitting: Family Medicine

## 2019-09-29 ENCOUNTER — Other Ambulatory Visit: Payer: Self-pay | Admitting: Family Medicine

## 2019-09-30 ENCOUNTER — Other Ambulatory Visit: Payer: Self-pay | Admitting: Family Medicine

## 2019-09-30 NOTE — Telephone Encounter (Signed)
Last med check august 2019

## 2019-10-01 NOTE — Telephone Encounter (Signed)
I do not feel comfortable filling this please have Dr. Richardson Landry review this

## 2019-10-03 NOTE — Telephone Encounter (Signed)
One mo, reach out to pt and sched virt visit

## 2019-10-05 NOTE — Telephone Encounter (Signed)
Left msg for patient to call and schedule an appt.

## 2019-10-05 NOTE — Telephone Encounter (Signed)
Please schedule and route back to nurses 

## 2019-10-08 ENCOUNTER — Other Ambulatory Visit: Payer: Self-pay

## 2019-10-08 ENCOUNTER — Ambulatory Visit (INDEPENDENT_AMBULATORY_CARE_PROVIDER_SITE_OTHER): Payer: Medicare HMO | Admitting: Family Medicine

## 2019-10-08 DIAGNOSIS — K0510 Chronic gingivitis, plaque induced: Secondary | ICD-10-CM | POA: Diagnosis not present

## 2019-10-08 DIAGNOSIS — I889 Nonspecific lymphadenitis, unspecified: Secondary | ICD-10-CM

## 2019-10-08 MED ORDER — PENICILLIN V POTASSIUM 500 MG PO TABS: ORAL_TABLET | ORAL | 0 refills | Status: DC

## 2019-10-08 NOTE — Progress Notes (Signed)
   Subjective:  Audio plus video  Patient ID: [name] [name], female    DOB: 1970/10/31, 48 y.o.   MRN: [id number]  HPI Pt states that on her left side of face she is having pain. Painful to eat, lymph nodes are puffy. Began about 4 days ago. Pt had tried Anbesol but that had not helped much.  Virtual Visit via Telephone Note  I connected with Sharelle R Rappa on 10/08/19 at  1:10 PM EST by telephone and verified that I am speaking with the correct person using two identifiers.  Location: Patient: home Provider: office   I discussed the limitations, risks, security and privacy concerns of performing an evaluation and management service by telephone and the availability of in person appointments. I also discussed with the patient that there may be a patient responsible charge related to this service. The patient expressed understanding and agreed to proceed.   History of Present Illness:    Observations/Objective:   Assessment and Plan:   Follow Up Instructions:    I discussed the assessment and treatment plan with the patient. The patient was provided an opportunity to ask questions and all were answered. The patient agreed with the plan and demonstrated an understanding of the instructions.   The patient was advised to call back or seek an in-person evaluation if the symptoms worsen or if the condition fails to improve as anticipated.  I provided 18 minutes of non-face-to-face time during this encounter.   Vicente Males, LPN  Patient notes swelling around the gums.  On the side of her jaw.  Also lymph nodes are bit swollen.  No obvious fever or chills but did feel a bit achy.  See nurses notes above  Review of Systems No headache no chest pain    Objective:   Physical Exam  Virtual      Assessment & Plan:  Impression gingivitis with secondary lymphadenitis.  May have been odontogenic source also.  Discussed.  Dental visit encouraged.  Pen-Vee K 4 times daily 10 days  symptom care discussed

## 2019-10-11 ENCOUNTER — Encounter: Payer: Self-pay | Admitting: Family Medicine

## 2019-10-14 ENCOUNTER — Telehealth: Payer: Self-pay | Admitting: Family Medicine

## 2019-10-14 MED ORDER — CLINDAMYCIN HCL 150 MG PO CAPS: ORAL_CAPSULE | ORAL | 0 refills | Status: DC

## 2019-10-14 NOTE — Telephone Encounter (Signed)
Patient had a virtual visit on 12/31 for dental infection. Has been on the antibiotic since 10/08/19 and doesn't think it has helped at all. She is now having ear and neck pain.  Wanted to get a stronger antibiotic called in.   [employer]

## 2019-10-14 NOTE — Telephone Encounter (Signed)
Clindamycin 150 numb 30 one tid ten d, needs dentist

## 2019-10-14 NOTE — Telephone Encounter (Signed)
Medication sent to pharmacy and pt is aware. Pt will be working on finding a Education officer, community.

## 2019-10-14 NOTE — Telephone Encounter (Signed)
Contacted patient. Pt has not seen a dentist yet. Encourage pt to seek dental care. Pt states that she is unable to go to the dentist at this time due to possible exposure to COVID. Pt states she is having horrible pain. Please advise. Thank you

## 2019-10-15 ENCOUNTER — Ambulatory Visit: Payer: Medicare HMO | Attending: Internal Medicine

## 2019-10-15 ENCOUNTER — Other Ambulatory Visit: Payer: Self-pay

## 2019-10-15 DIAGNOSIS — Z20822 Contact with and (suspected) exposure to covid-19: Secondary | ICD-10-CM | POA: Diagnosis not present

## 2019-10-16 LAB — NOVEL CORONAVIRUS, NAA: SARS-CoV-2, NAA: NOT DETECTED

## 2019-10-19 ENCOUNTER — Other Ambulatory Visit: Payer: Self-pay | Admitting: Family Medicine

## 2019-10-20 DIAGNOSIS — G8929 Other chronic pain: Secondary | ICD-10-CM | POA: Diagnosis not present

## 2019-10-20 DIAGNOSIS — Z79899 Other long term (current) drug therapy: Secondary | ICD-10-CM | POA: Diagnosis not present

## 2019-10-20 DIAGNOSIS — G894 Chronic pain syndrome: Secondary | ICD-10-CM | POA: Diagnosis not present

## 2019-10-20 DIAGNOSIS — R109 Unspecified abdominal pain: Secondary | ICD-10-CM | POA: Diagnosis not present

## 2019-10-20 DIAGNOSIS — E559 Vitamin D deficiency, unspecified: Secondary | ICD-10-CM | POA: Diagnosis not present

## 2019-10-30 ENCOUNTER — Encounter (HOSPITAL_COMMUNITY)
Admission: RE | Admit: 2019-10-30 | Discharge: 2019-10-30 | Disposition: A | Discharge status: Home / Self Care | Payer: Medicare HMO | Source: Ambulatory Visit | Attending: Family Medicine | Admitting: Family Medicine

## 2019-10-30 ENCOUNTER — Other Ambulatory Visit: Payer: Self-pay

## 2019-10-30 DIAGNOSIS — I81 Portal vein thrombosis: Secondary | ICD-10-CM | POA: Insufficient documentation

## 2019-10-30 LAB — CBC WITH DIFFERENTIAL/PLATELET
Abs Immature Granulocytes: 0.02 10*3/uL (ref 0.00–0.07)
Basophils Absolute: 0 10*3/uL (ref 0.0–0.1)
Basophils Relative: 0 %
Eosinophils Absolute: 0.1 10*3/uL (ref 0.0–0.5)
Eosinophils Relative: 2 %
HCT: 39.4 % (ref 36.0–46.0)
Hemoglobin: 13.1 g/dL (ref 12.0–15.0)
Immature Granulocytes: 0 %
Lymphocytes Relative: 50 %
Lymphs Abs: 2.7 10*3/uL (ref 0.7–4.0)
MCH: 33.2 pg (ref 26.0–34.0)
MCHC: 33.2 g/dL (ref 30.0–36.0)
MCV: 100 fL (ref 80.0–100.0)
Monocytes Absolute: 0.4 10*3/uL (ref 0.1–1.0)
Monocytes Relative: 7 %
Neutro Abs: 2.2 10*3/uL (ref 1.7–7.7)
Neutrophils Relative %: 41 %
Platelets: 209 10*3/uL (ref 150–400)
RBC: 3.94 MIL/uL (ref 3.87–5.11)
RDW: 12.4 % (ref 11.5–15.5)
WBC: 5.4 10*3/uL (ref 4.0–10.5)
nRBC: 0 % (ref 0.0–0.2)

## 2019-10-30 LAB — COMPREHENSIVE METABOLIC PANEL
ALT: 27 U/L (ref 0–44)
AST: 23 U/L (ref 15–41)
Albumin: 3.4 g/dL — ABNORMAL LOW (ref 3.5–5.0)
Alkaline Phosphatase: 63 U/L (ref 38–126)
Anion gap: 9 (ref 5–15)
BUN: 14 mg/dL (ref 6–20)
CO2: 19 mmol/L — ABNORMAL LOW (ref 22–32)
Calcium: 8.4 mg/dL — ABNORMAL LOW (ref 8.9–10.3)
Chloride: 107 mmol/L (ref 98–111)
Creatinine, Ser: 0.48 mg/dL (ref 0.44–1.00)
GFR calc Af Amer: >60 mL/min (ref >60)
GFR calc non Af Amer: >60 mL/min (ref >60)
Glucose, Bld: 139 mg/dL — ABNORMAL HIGH (ref 70–99)
Potassium: 4.2 mmol/L (ref 3.5–5.1)
Sodium: 135 mmol/L (ref 135–145)
Total Bilirubin: 0.5 mg/dL (ref 0.3–1.2)
Total Protein: 6.7 g/dL (ref 6.5–8.1)

## 2019-10-30 LAB — VITAMIN D 25 HYDROXY (VIT D DEFICIENCY, FRACTURES): Vit D, 25-Hydroxy: 9.24 ng/mL — ABNORMAL LOW (ref 30–100)

## 2019-10-30 MED ORDER — HEPARIN SOD (PORK) LOCK FLUSH 100 UNIT/ML IV SOLN
500.0000 [IU] | Freq: Once | INTRAVENOUS | Status: AC
  Administered 2019-10-30: 500 [IU] via INTRAVENOUS

## 2019-11-09 ENCOUNTER — Other Ambulatory Visit: Payer: Self-pay | Admitting: Family Medicine

## 2019-11-11 ENCOUNTER — Encounter: Payer: Self-pay | Admitting: Family Medicine

## 2019-11-16 DIAGNOSIS — R69 Illness, unspecified: Secondary | ICD-10-CM | POA: Diagnosis not present

## 2019-11-24 DIAGNOSIS — Z79899 Other long term (current) drug therapy: Secondary | ICD-10-CM | POA: Diagnosis not present

## 2019-11-24 DIAGNOSIS — G894 Chronic pain syndrome: Secondary | ICD-10-CM | POA: Diagnosis not present

## 2019-11-24 DIAGNOSIS — R109 Unspecified abdominal pain: Secondary | ICD-10-CM | POA: Diagnosis not present

## 2019-11-24 DIAGNOSIS — G8929 Other chronic pain: Secondary | ICD-10-CM | POA: Diagnosis not present

## 2019-12-11 ENCOUNTER — Encounter (HOSPITAL_COMMUNITY): Admission: RE | Admit: 2019-12-11 | Payer: Medicare HMO | Source: Ambulatory Visit

## 2019-12-18 ENCOUNTER — Encounter (HOSPITAL_COMMUNITY)
Admission: RE | Admit: 2019-12-18 | Discharge: 2019-12-18 | Disposition: A | Discharge status: Home / Self Care | Payer: Medicare HMO | Source: Ambulatory Visit | Attending: Family Medicine | Admitting: Family Medicine

## 2019-12-24 DIAGNOSIS — Z79899 Other long term (current) drug therapy: Secondary | ICD-10-CM | POA: Diagnosis not present

## 2019-12-24 DIAGNOSIS — F1120 Opioid dependence, uncomplicated: Secondary | ICD-10-CM | POA: Diagnosis not present

## 2019-12-24 DIAGNOSIS — G8929 Other chronic pain: Secondary | ICD-10-CM | POA: Diagnosis not present

## 2019-12-24 DIAGNOSIS — R109 Unspecified abdominal pain: Secondary | ICD-10-CM | POA: Diagnosis not present

## 2019-12-24 DIAGNOSIS — G894 Chronic pain syndrome: Secondary | ICD-10-CM | POA: Diagnosis not present

## 2019-12-28 ENCOUNTER — Other Ambulatory Visit: Payer: Self-pay

## 2019-12-28 ENCOUNTER — Encounter (HOSPITAL_COMMUNITY)
Admission: RE | Admit: 2019-12-28 | Discharge: 2019-12-28 | Disposition: A | Discharge status: Home / Self Care | Payer: Medicare HMO | Source: Ambulatory Visit | Attending: Family Medicine | Admitting: Family Medicine

## 2019-12-28 DIAGNOSIS — I81 Portal vein thrombosis: Secondary | ICD-10-CM | POA: Diagnosis not present

## 2019-12-28 MED ORDER — HEPARIN SOD (PORK) LOCK FLUSH 100 UNIT/ML IV SOLN
500.0000 [IU] | Freq: Once | INTRAVENOUS | Status: AC
  Administered 2019-12-28: 11:00:00 500 [IU] via INTRAVENOUS

## 2020-01-01 ENCOUNTER — Telehealth: Payer: Self-pay | Admitting: Family Medicine

## 2020-01-01 NOTE — Telephone Encounter (Signed)
Patient has a history of pancreatitis issues and has a specialist at [facility]. Patient is having pain and nausea and is sure it is her pancreas and wanted to know if we could order labs for her or if she needed to contact her specialist.

## 2020-01-01 NOTE — Telephone Encounter (Signed)
Patient is requesting labs to check her pancreas.

## 2020-01-01 NOTE — Telephone Encounter (Signed)
Patient notified and will contact her specialist for their recommendations.

## 2020-01-01 NOTE — Telephone Encounter (Signed)
I would recommend if she is concerned she has pancreatitis to contact her specialist or go to ER she would need to know results ASAP if we ordered these we would not get these back during regular hours

## 2020-01-13 ENCOUNTER — Other Ambulatory Visit: Payer: Self-pay | Admitting: Family Medicine

## 2020-01-22 DIAGNOSIS — Z79899 Other long term (current) drug therapy: Secondary | ICD-10-CM | POA: Diagnosis not present

## 2020-01-22 DIAGNOSIS — G8929 Other chronic pain: Secondary | ICD-10-CM | POA: Diagnosis not present

## 2020-01-22 DIAGNOSIS — R109 Unspecified abdominal pain: Secondary | ICD-10-CM | POA: Diagnosis not present

## 2020-01-22 DIAGNOSIS — G894 Chronic pain syndrome: Secondary | ICD-10-CM | POA: Diagnosis not present

## 2020-02-08 ENCOUNTER — Encounter (HOSPITAL_COMMUNITY): Admission: RE | Admit: 2020-02-08 | Payer: Medicare HMO | Source: Ambulatory Visit

## 2020-02-10 ENCOUNTER — Encounter (HOSPITAL_COMMUNITY): Admission: RE | Admit: 2020-02-10 | Payer: Medicare HMO | Source: Ambulatory Visit

## 2020-02-19 ENCOUNTER — Other Ambulatory Visit: Payer: Self-pay

## 2020-02-19 ENCOUNTER — Encounter (HOSPITAL_COMMUNITY)
Admission: RE | Admit: 2020-02-19 | Discharge: 2020-02-19 | Disposition: A | Discharge status: Home / Self Care | Payer: Medicare HMO | Source: Ambulatory Visit | Attending: Family Medicine | Admitting: Family Medicine

## 2020-02-19 DIAGNOSIS — Z452 Encounter for adjustment and management of vascular access device: Secondary | ICD-10-CM | POA: Insufficient documentation

## 2020-02-19 DIAGNOSIS — I81 Portal vein thrombosis: Secondary | ICD-10-CM | POA: Insufficient documentation

## 2020-02-19 MED ORDER — HEPARIN SOD (PORK) LOCK FLUSH 100 UNIT/ML IV SOLN
500.0000 [IU] | Freq: Once | INTRAVENOUS | Status: AC
  Administered 2020-02-19: 500 [IU] via INTRAVENOUS

## 2020-02-26 ENCOUNTER — Other Ambulatory Visit: Payer: Self-pay | Admitting: Family Medicine

## 2020-02-26 NOTE — Telephone Encounter (Signed)
Last seen 02/10/19 for uti

## 2020-03-15 ENCOUNTER — Other Ambulatory Visit: Payer: Self-pay | Admitting: Family Medicine

## 2020-03-15 NOTE — Telephone Encounter (Signed)
Scheduled 6/21

## 2020-03-15 NOTE — Telephone Encounter (Signed)
Appt 6/21. Could not find a recent visit in epic discussing this med. Went back to 2019

## 2020-03-15 NOTE — Telephone Encounter (Signed)
Please contact patient and have her set up appt. Then may route back to nurses. Thank you 

## 2020-03-16 ENCOUNTER — Telehealth: Payer: Self-pay | Admitting: Family Medicine

## 2020-03-16 NOTE — Telephone Encounter (Signed)
Appointment schedule for 6/15

## 2020-03-16 NOTE — Telephone Encounter (Signed)
Patient is requesting refill zolpidem 10 mg ,she has medication follow up on 6/21. [employer]

## 2020-03-16 NOTE — Telephone Encounter (Signed)
Med was refused yesterday by dr Ladona Ridgel due to [name] [name] not being seen for this med in over one year. I called [name] [name] and told her that and she is completely out of med. Can her appt be moved up sooner. [name] [name] states she is willing to come in sooner to get her med. Please call and schedule. thanks

## 2020-03-22 ENCOUNTER — Ambulatory Visit: Payer: Medicare HMO | Admitting: Family Medicine

## 2020-03-28 ENCOUNTER — Ambulatory Visit: Payer: Medicare HMO | Admitting: Family Medicine

## 2020-04-01 ENCOUNTER — Encounter (HOSPITAL_COMMUNITY)
Admission: RE | Admit: 2020-04-01 | Discharge: 2020-04-01 | Disposition: A | Discharge status: Home / Self Care | Payer: Medicare HMO | Source: Ambulatory Visit | Attending: Family Medicine | Admitting: Family Medicine

## 2020-04-20 ENCOUNTER — Encounter (HOSPITAL_COMMUNITY)
Admission: RE | Admit: 2020-04-20 | Discharge: 2020-04-20 | Disposition: A | Discharge status: Home / Self Care | Payer: Medicare HMO | Source: Ambulatory Visit | Attending: Family Medicine | Admitting: Family Medicine

## 2020-06-22 ENCOUNTER — Telehealth: Payer: Self-pay | Admitting: Family Medicine

## 2020-06-22 NOTE — Telephone Encounter (Signed)
Covid positive around 06/08/2020, pt states lungs are not getting better  When can we schedule?  Car visit?  Quarantine ended 06/20/2020  Please advise & call

## 2020-06-22 NOTE — Telephone Encounter (Signed)
Called pt and she states she is having some sob when moving around. I advised her to go to urgent care since there are no available appts here today. Pt states she will go to urgent care today.

## 2020-06-29 ENCOUNTER — Ambulatory Visit: Payer: Self-pay | Admitting: *Deleted

## 2020-06-29 NOTE — Telephone Encounter (Signed)
C/o persisting symptoms of covid since August 30. Patient tested self with home covid test . Symptoms persisting with runny nose, cough, vomiting, constipation, poor po intake, drinking ok, chest pain with SOB. Reports weight of 155lbs in August and now 122lbs. Denies fever, decreased urine output, difficulty breathing. Hx diabetic, autoimmune disease, s/p whipple procedure. Encouraged patient to call PCP in am for appt. Care advise given. Patient verbalized understanding of care advise and to call back or go to [facility] or ED if symptoms worsen.  Reason for Disposition . [1] PERSISTING SYMPTOMS OF COVID-19 AND [2] symptoms WORSE  Answer Assessment - Initial Assessment Questions 1. COVID-19 ONSET: "When did the symptoms of COVID-19 first start?"     August 30 2. DIAGNOSIS CONFIRMATION: "How were you diagnosed?" (e.g., COVID-19 oral or nasal viral test; COVID-19 antibody test; doctor visit)     Home test  3. MAIN SYMPTOM:  "What is your main concern or symptom right now?" (e.g., breathing difficulty, cough, fatigue. loss of smell)     Chest pain and SOB, constipation, vomiting, fatigue 4. SYMPTOM ONSET: "When did the  Symptoms   start?"     August 30 5. BETTER-SAME-WORSE: "Are you getting better, staying the same, or getting worse over the last 1 to 2 weeks?"     Getting worse 6. RECENT MEDICAL VISIT: "Have you been seen by a healthcare provider (doctor, NP, PA) for these persisting COVID-19 symptoms?" If Yes, ask: "When were you seen?" (e.g., date)     no 7. COUGH: "Do you have a cough?" If Yes, ask: "How bad is the cough?"       Yes not that bad coughing up clear mucus  8. FEVER: "Do you have a fever?" If Yes, ask: "What is your temperature, how was it measured, and when did it start?"     No was in beginning of September  9. BREATHING DIFFICULTY: "Are you having any trouble breathing?" If Yes, ask: "How bad is your breathing?" (e.g., mild, moderate, severe)    - MILD: No SOB at rest, mild SOB with  walking, speaks normally in sentences, can lay down, no retractions, pulse < 100.    - MODERATE: SOB at rest, SOB with minimal exertion and prefers to sit, cannot lie down flat, speaks in phrases, mild retractions, audible wheezing, pulse 100-120.    - SEVERE: Very SOB at rest, speaks in single words, struggling to breathe, sitting hunched forward, retractions, pulse > 120       Moderate  10. HIGH RISK DISEASE: "Do you have any chronic medical problems?" (e.g., asthma, heart or lung disease, weak immune system, obesity, etc.)       Diabetic, autoimmune disease, "whipple" procedure 11. PREGNANCY: "Is there any chance you are pregnant?" "When was your last menstrual period?"       na 12. OTHER SYMPTOMS: "Do you have any other symptoms?"  (e.g., fatigue, headache, muscle pain, weakness)       Fatigue, body aches, chest pain, cough , poor appetite, constipation, vomiting, LBM small yesterday, runny nose  Protocols used: CORONAVIRUS (COVID-19) PERSISTING SYMPTOMS FOLLOW-UP CALL-A-AH

## 2020-09-23 ENCOUNTER — Encounter (HOSPITAL_COMMUNITY): Admission: RE | Admit: 2020-09-23 | Payer: Medicare HMO | Source: Ambulatory Visit

## 2020-10-12 ENCOUNTER — Encounter (HOSPITAL_COMMUNITY): Payer: Self-pay

## 2020-10-12 ENCOUNTER — Other Ambulatory Visit: Payer: Self-pay

## 2020-10-12 ENCOUNTER — Encounter (HOSPITAL_COMMUNITY)
Admission: RE | Admit: 2020-10-12 | Discharge: 2020-10-12 | Disposition: A | Discharge status: Home / Self Care | Payer: Medicare HMO | Source: Ambulatory Visit | Attending: Internal Medicine | Admitting: Internal Medicine

## 2020-10-12 DIAGNOSIS — I81 Portal vein thrombosis: Secondary | ICD-10-CM | POA: Diagnosis not present

## 2020-10-12 MED ORDER — HEPARIN SOD (PORK) LOCK FLUSH 100 UNIT/ML IV SOLN
500.0000 [IU] | Freq: Once | INTRAVENOUS | Status: AC
  Administered 2020-10-12: 500 [IU] via INTRAVENOUS

## 2020-12-07 ENCOUNTER — Other Ambulatory Visit: Payer: Self-pay

## 2020-12-07 ENCOUNTER — Encounter (HOSPITAL_COMMUNITY)
Admission: RE | Admit: 2020-12-07 | Discharge: 2020-12-07 | Disposition: A | Discharge status: Home / Self Care | Payer: Medicare HMO | Source: Ambulatory Visit | Attending: Internal Medicine | Admitting: Internal Medicine

## 2020-12-07 ENCOUNTER — Encounter (HOSPITAL_COMMUNITY): Payer: Self-pay

## 2020-12-07 DIAGNOSIS — I81 Portal vein thrombosis: Secondary | ICD-10-CM | POA: Insufficient documentation

## 2020-12-07 MED ORDER — HEPARIN SOD (PORK) LOCK FLUSH 100 UNIT/ML IV SOLN
500.0000 [IU] | Freq: Once | INTRAVENOUS | Status: AC
  Administered 2020-12-07: 500 [IU] via INTRAVENOUS

## 2020-12-07 NOTE — Progress Notes (Signed)
Incorrect LDA added for documentation.

## 2021-02-01 ENCOUNTER — Other Ambulatory Visit: Payer: Self-pay

## 2021-02-01 ENCOUNTER — Encounter (HOSPITAL_COMMUNITY)
Admission: RE | Admit: 2021-02-01 | Discharge: 2021-02-01 | Disposition: A | Discharge status: Home / Self Care | Payer: Medicare HMO | Source: Ambulatory Visit | Attending: Internal Medicine | Admitting: Internal Medicine

## 2021-02-01 DIAGNOSIS — I81 Portal vein thrombosis: Secondary | ICD-10-CM | POA: Insufficient documentation

## 2021-02-01 MED ORDER — HEPARIN SOD (PORK) LOCK FLUSH 100 UNIT/ML IV SOLN
INTRAVENOUS | Status: AC
  Filled 2021-02-01: qty 5

## 2021-02-01 MED ORDER — HEPARIN SOD (PORK) LOCK FLUSH 100 UNIT/ML IV SOLN
500.0000 [IU] | INTRAVENOUS | Status: AC | PRN
  Administered 2021-02-01: 500 [IU]
  Filled 2021-02-01: qty 5

## 2021-02-01 MED ORDER — SODIUM CHLORIDE 0.9% FLUSH
10.0000 mL | INTRAVENOUS | Status: AC | PRN
  Administered 2021-02-01: 10 mL

## 2021-03-29 ENCOUNTER — Encounter (HOSPITAL_COMMUNITY)
Admission: RE | Admit: 2021-03-29 | Discharge: 2021-03-29 | Disposition: A | Discharge status: Home / Self Care | Payer: Medicare HMO | Source: Ambulatory Visit | Attending: Internal Medicine | Admitting: Internal Medicine

## 2021-03-29 ENCOUNTER — Other Ambulatory Visit: Payer: Self-pay

## 2021-03-29 DIAGNOSIS — I81 Portal vein thrombosis: Secondary | ICD-10-CM | POA: Diagnosis not present

## 2021-03-29 MED ORDER — HEPARIN SOD (PORK) LOCK FLUSH 100 UNIT/ML IV SOLN
500.0000 [IU] | Freq: Once | INTRAVENOUS | Status: AC
  Administered 2021-03-29: 11:00:00 500 [IU]

## 2021-03-29 MED ORDER — SODIUM CHLORIDE 0.9% FLUSH: 10.0000 mL | INTRAVENOUS | Status: DC | PRN

## 2021-03-30 LAB — HEMOGLOBIN A1C: Hemoglobin A1C: 5.6

## 2021-05-11 LAB — VITAMIN D 25 HYDROXY (VIT D DEFICIENCY, FRACTURES): Vit D, 25-Hydroxy: 16.05

## 2021-05-11 LAB — BASIC METABOLIC PANEL
BUN: 17 (ref 4–21)
Creatinine: 0.7 (ref 0.5–1.1)

## 2021-05-18 MED ORDER — SODIUM CHLORIDE 0.9% FLUSH: 10.0000 mL | INTRAVENOUS | Status: DC | PRN

## 2021-05-18 MED ORDER — HEPARIN SOD (PORK) LOCK FLUSH 100 UNIT/ML IV SOLN: 500.0000 [IU] | INTRAVENOUS | Status: DC | PRN

## 2021-05-24 ENCOUNTER — Other Ambulatory Visit: Payer: Self-pay

## 2021-05-24 ENCOUNTER — Encounter (HOSPITAL_COMMUNITY)
Admission: RE | Admit: 2021-05-24 | Discharge: 2021-05-24 | Disposition: A | Discharge status: Home / Self Care | Payer: Medicare HMO | Source: Ambulatory Visit | Attending: Internal Medicine | Admitting: Internal Medicine

## 2021-05-24 DIAGNOSIS — I81 Portal vein thrombosis: Secondary | ICD-10-CM | POA: Insufficient documentation

## 2021-05-24 MED ORDER — HEPARIN SOD (PORK) LOCK FLUSH 100 UNIT/ML IV SOLN
INTRAVENOUS | Status: AC
  Filled 2021-05-24: qty 5

## 2021-05-24 MED ORDER — HEPARIN SOD (PORK) LOCK FLUSH 100 UNIT/ML IV SOLN
500.0000 [IU] | INTRAVENOUS | Status: AC | PRN
  Administered 2021-05-24: 500 [IU]

## 2021-07-14 ENCOUNTER — Ambulatory Visit: Payer: Medicare HMO | Admitting: "Endocrinology

## 2021-07-19 ENCOUNTER — Encounter (HOSPITAL_COMMUNITY)
Admission: RE | Admit: 2021-07-19 | Discharge: 2021-07-19 | Disposition: A | Discharge status: Home / Self Care | Payer: Medicare HMO | Source: Ambulatory Visit | Attending: Internal Medicine | Admitting: Internal Medicine

## 2021-08-02 ENCOUNTER — Ambulatory Visit: Payer: Medicare HMO | Admitting: "Endocrinology

## 2021-08-10 ENCOUNTER — Ambulatory Visit (INDEPENDENT_AMBULATORY_CARE_PROVIDER_SITE_OTHER): Payer: Medicare HMO | Admitting: Gastroenterology

## 2021-08-15 ENCOUNTER — Encounter: Payer: Self-pay | Admitting: "Endocrinology

## 2021-08-15 ENCOUNTER — Other Ambulatory Visit: Payer: Self-pay

## 2021-08-15 ENCOUNTER — Ambulatory Visit: Payer: Medicare HMO | Admitting: "Endocrinology

## 2021-08-15 VITALS — BP 108/84 | HR 72 | Ht 64.0 in | Wt 128.8 lb

## 2021-08-15 DIAGNOSIS — K8689 Other specified diseases of pancreas: Secondary | ICD-10-CM | POA: Diagnosis not present

## 2021-08-15 DIAGNOSIS — E1069 Type 1 diabetes mellitus with other specified complication: Secondary | ICD-10-CM | POA: Diagnosis not present

## 2021-08-15 LAB — POCT GLYCOSYLATED HEMOGLOBIN (HGB A1C): HbA1c, POC (controlled diabetic range): 8.9 % — AB (ref 0.0–7.0)

## 2021-08-15 MED ORDER — TRESIBA FLEXTOUCH 200 UNIT/ML ~~LOC~~ SOPN: 30.0000 [IU] | PEN_INJECTOR | Freq: Every day | SUBCUTANEOUS | 2 refills | Status: DC

## 2021-08-15 MED ORDER — ACCU-CHEK GUIDE VI STRP: ORAL_STRIP | 2 refills | Status: AC

## 2021-08-15 MED ORDER — INSULIN LISPRO (1 UNIT DIAL) 100 UNIT/ML (KWIKPEN): 5.0000 [IU] | PEN_INJECTOR | Freq: Three times a day (TID) | SUBCUTANEOUS | 1 refills | Status: DC

## 2021-08-15 MED ORDER — ACCU-CHEK FASTCLIX LANCETS MISC: 1 refills | Status: AC

## 2021-08-15 NOTE — Progress Notes (Signed)
Endocrinology Consult Note       08/15/2021, 12:34 PM   Subjective:    Patient ID: [name] [name], female    DOB: 01-Dec-1970.  [name] [name] is being seen in consultation for management of currently uncontrolled symptomatic diabetes requested by  Carylon Perches, NP.   Past Medical History:  Diagnosis Date   Acute bacterial bronchitis    dx 12-06-2014  --  FINISHED Z-PAC-  NO FEVER BUT STILL INTERMITTANT  PRODUCTIVE COUGH   Anxiety    Chronic pain    has pain specialist   Diabetes mellitus type I ([place])    GERD (gastroesophageal reflux disease)    History of chronic gastritis    History of chronic pancreatitis    multple recurrent--  s/p  whipple 2011   History of esophageal ulcer    History of kidney stones    History of subarachnoid hemorrhage    01-08-2013--  fell on concrete--  resolved without surgical intervention   History of suicide attempt    hx multiple attempts by overdosing of meds   IBS (irritable bowel syndrome)    Left ureteral calculus    Major depression, recurrent, chronic ([place])    w/  Hx  multiple suicide attempts (overdose)   Migraine headache    Type 2 diabetes mellitus, uncontrolled    Whipple disease 2011    Past Surgical History:  Procedure Laterality Date   CESAREAN SECTION  x2  last one 02-29-2000   last one w/ BILATERAL TUBAL LIGATION   COLONOSCOPY  03-14-2009   dx ileocolonscopy   CYSTOSCOPY WITH RETROGRADE PYELOGRAM, URETEROSCOPY AND STENT PLACEMENT Left 12/20/2014   Procedure: CYSTOSCOPY, LEFT URETEROSCOPY, STONE REMOVAL, LASER LITHOTRIPSY;  Surgeon: Rana Snare, MD;  Location: [facility];  Service: Urology;  Laterality: Left;   ESOPHAGOGASTRODUODENOSCOPY  last  one 12-31-2008   ESOPHAGOGASTRODUODENOSCOPY (EGD) WITH PROPOFOL N/A 05/28/2017   Procedure: ESOPHAGOGASTRODUODENOSCOPY (EGD) WITH PROPOFOL;  Surgeon: Rogene Houston, MD;  [facility];  Service: Endoscopy;  Laterality: N/A;  2:00   HOLMIUM LASER APPLICATION Left [id number]   Procedure: HOLMIUM LASER APPLICATION;  Surgeon: Rana Snare, MD;  Location: [facility];  Service: Urology;  Laterality: Left;   LAPAROSCOPIC ASSISTED VAGINAL HYSTERECTOMY  12-16-2006   LAPAROSCOPIC NISSEN FUNDOPLICATION  Nov [id number]   w/  CHOLECYSTECTOMY   PORTACATH PLACEMENT N/A 07/12/2017   Procedure: INSERTION PORT-A-CATH;  Surgeon: Aviva Signs, MD;  Location: [facility];  Service: General;  Laterality: N/A;   REPAIR PERFORATED VISCUS  POST WHIPPLE  x5    last one 06-23-2012   TUBAL LIGATION     WHIPPLE PROCEDURE  2011   at [place] History   Marital status: Legally Separated    Spouse name: Not on file   Number of children: Not on file   Years of education: Not on file   Highest education level: Not on file  Occupational History   Not on file  Tobacco Use   Smoking status: Never   Smokeless tobacco: Never  Vaping Use   Vaping Use: Never used  Substance and Sexual Activity   Alcohol use: No   Drug use: Not Currently   Sexual activity: Yes    Birth control/protection: Surgical  Other Topics Concern   Not on file  Social History Narrative   Not on file   Social Determinants of Health   Financial Resource Strain: Not on file  Food Insecurity: Not on file  Transportation Needs: Not on file  Physical Activity: Not on file  Stress: Not on file  Social Connections: Not on file    Family History  Problem Relation Age of Onset   Hypertension Mother    Hyperlipidemia Mother    Diabetes Father    Stroke Father     Outpatient Encounter Medications as of 08/15/2021  Medication Sig   Accu-Chek FastClix Lancets MISC Use to monitor glucose 4 times daily   glucose blood (ACCU-CHEK GUIDE) test strip Use as instructed   insulin lispro (HUMALOG KWIKPEN) 100 UNIT/ML KwikPen Inject 5-8 Units into the skin 3 (three) times daily before meals.    nortriptyline (PAMELOR) 25 MG capsule Take 25 mg by mouth at bedtime.   [DISCONTINUED] insulin degludec (TRESIBA FLEXTOUCH) 200 UNIT/ML FlexTouch Pen Inject 30 Units into the skin at bedtime.   buprenorphine (BUTRANS) 10 MCG/HR PTWK 1 patch once a week.   clindamycin (CLEOCIN) 150 MG capsule Take one capsule po TID for 10 days (Patient not taking: Reported on 08/15/2021)   CREON 36000-114000 units CPEP capsule Take 36,000 Units by mouth 3 (three) times daily with meals.   insulin degludec (TRESIBA FLEXTOUCH) 200 UNIT/ML FlexTouch Pen Inject 30 Units into the skin at bedtime.   LINZESS 290 MCG CAPS capsule Take 290 mcg by mouth daily.   ondansetron (ZOFRAN ODT) 4 MG disintegrating tablet Take 1 tablet (4 mg total) by mouth every 8 (eight) hours as needed for nausea or vomiting. (Patient not taking: Reported on 08/15/2021)   oxyCODONE (OXY IR/ROXICODONE) 5 MG immediate release tablet Take 1 tablet by mouth 3 (three) times daily.   pantoprazole (PROTONIX) 40 MG tablet TAKE ONE TABLET BY MOUTH ONCE DAILY.   penicillin v potassium (VEETID) 500 MG tablet Take one tablet po QID for 10 days (Patient not taking: Reported on 08/15/2021)   promethazine (PHENERGAN) 50 MG tablet TAKE 1 TABLET BY MOUTH EVERY 6 HOURS AS NEEDED FOR NAUSEA OR VOMITING.   triamcinolone ointment (KENALOG) 0.5 % Apply 1 application topically 2 (two) times daily. (Patient not taking: Reported on 08/15/2021)   zolpidem (AMBIEN) 10 MG tablet TAKE ONE TABLET BY MOUTH AT BEDTIME AS NEEDED FOR SLEEP. (Patient taking differently: 5 mg.)   [DISCONTINUED] BELBUCA 150 MCG FILM    [DISCONTINUED] etodolac (LODINE) 400 MG tablet Take 1 tablet (400 mg total) by mouth 2 (two) times daily. With food.   [DISCONTINUED] insulin glargine (LANTUS) 100 UNIT/ML Solostar Pen Inject 48 Units into the skin at bedtime.    [DISCONTINUED] insulin lispro (HUMALOG) 100 UNIT/ML KiwkPen Inject 5-8 Units into the skin 3 (three) times daily before meals. Per sliding  scale    [DISCONTINUED] nitrofurantoin, macrocrystal-monohydrate, (MACROBID) 100 MG capsule Take 1 capsule (100 mg total) by mouth 2 (two) times daily.   [DISCONTINUED] nortriptyline (PAMELOR) 25 MG capsule TAKE 4 CAPSULES BY MOUTH AT BEDTIME. (Patient taking differently: 25 mg. TAKE 4 CAPSULES BY MOUTH AT BEDTIME.)   [DISCONTINUED] pregabalin (LYRICA) 50 MG capsule Take 1 capsule by mouth 3 (three) times daily.   No facility-administered encounter medications on file as of 08/15/2021.    ALLERGIES:  Allergies  Allergen Reactions   Buspar [Buspirone] Other (See Comments)    Dizziness   Metoclopramide Hcl Other (See Comments)    "lock-jaw"    VACCINATION STATUS: Immunization History  Administered Date(s) Administered   Influenza Split 01/09/2013   Influenza,inj,Quad PF,6+ Mos 08/14/2013   Influenza-Unspecified 09/08/2011   Td 01/08/2013   Tdap 09/20/2011    Diabetes She presents for her initial diabetic visit. She has type 1 (Her diabetes started as type II, however after Whipple's procedure in 2011, reclassified as type I.) diabetes mellitus. Her disease course has been worsening. There are no hypoglycemic associated symptoms. Pertinent negatives for hypoglycemia include no confusion, headaches, pallor or seizures. Associated symptoms include fatigue, polydipsia and polyuria. Pertinent negatives for diabetes include no chest pain and no polyphagia. There are no hypoglycemic complications. Symptoms are worsening. Diabetic complications include peripheral neuropathy. Risk factors for coronary artery disease include diabetes mellitus. Current diabetic treatment includes insulin injections (She is currently on Tresiba 48 units nightly, Humalog up to 12 units 3 times daily AC.  However admittedly, she is not consistent using her Humalog.). Her weight is fluctuating minimally (Due to Whipple's procedure, she developed pancreatic insufficiency, currently on Creon 36,000 units 2 capsules daily AC,  1 capsule with snacks\). She is following a generally unhealthy diet. When asked about meal planning, she reported none. She has not had a previous visit with a dietitian. Her overall blood glucose range is >200 mg/dl. (She brought her Accu-Chek meter showing only 4 readings in the last 14 days.  Averaging 401.  Her point-of-care A1c is 8.9%, increasing from 5.7% in June 2022.)    Review of Systems  Constitutional:  Positive for fatigue. Negative for chills, fever and unexpected weight change.  HENT:  Negative for trouble swallowing and voice change.   Eyes:  Negative for visual disturbance.  Respiratory:  Negative for cough, shortness of breath and wheezing.   Cardiovascular:  Negative for chest pain, palpitations and leg swelling.  Gastrointestinal:  Negative for diarrhea, nausea and vomiting.  Endocrine: Positive for polydipsia and polyuria. Negative for cold intolerance, heat intolerance and polyphagia.  Musculoskeletal:  Negative for arthralgias and myalgias.  Skin:  Negative for color change, pallor, rash and wound.  Neurological:  Negative for seizures and headaches.  Psychiatric/Behavioral:  Negative for confusion and suicidal ideas.    Objective:    Vitals with BMI 08/15/2021 02/01/2021 06/26/2019  Height 5\' 4"  - -  Weight 128 lbs 13 oz - -  BMI 22.1 - -  Systolic 108 121  Diastolic 84 83 77  Pulse 72 100 77    BP 108/84   Pulse 72   Ht 5\' 4"  (1.626 m)   Wt 128 lb 12.8 oz (58.4 kg)   BMI 22.11 kg/m   Wt Readings from Last 3 Encounters:  08/15/21 128 lb 12.8 oz (58.4 kg)  06/26/19 145 lb (65.8 kg)  12/15/18 151 lb 0.6 oz (68.5 kg)     Physical Exam Constitutional:      Appearance: She is well-developed.  HENT:     Head: Normocephalic and atraumatic.  Neck:     Thyroid: No thyromegaly.     Trachea: No tracheal deviation.  Cardiovascular:     Rate and Rhythm: Normal rate and regular rhythm.  Pulmonary:     Effort: Pulmonary effort is normal.     Breath  sounds: Normal breath sounds.  Abdominal:     General: Bowel sounds are normal.     Tenderness:  There is no abdominal tenderness. There is no guarding.  Musculoskeletal:        General: Normal range of motion.     Cervical back: Normal range of motion and neck supple.  Skin:    General: Skin is warm and dry.     Coloration: Skin is not pale.     Findings: No erythema or rash.  Neurological:     Mental Status: She is alert and oriented to person, place, and time.     Cranial Nerves: No cranial nerve deficit.     Coordination: Coordination normal.     Deep Tendon Reflexes: Reflexes are normal and symmetric.  Psychiatric:        Judgment: Judgment normal.      CMP ( most recent) CMP     Component Value Date/Time   NA 135 10/30/2019 1006   NA 140 05/08/2017 1504   K 4.2 10/30/2019 1006   CL 107 10/30/2019 1006   CO2 19 (L) 10/30/2019 1006   GLUCOSE 139 (H) 10/30/2019 1006   BUN 17 05/11/2021 0000   CREATININE 0.7 05/11/2021 0000   CREATININE 0.48 10/30/2019 1006   CALCIUM 8.4 (L) 10/30/2019 1006   PROT 6.7 10/30/2019 1006   PROT 7.6 05/08/2017 1504   ALBUMIN 3.4 (L) 10/30/2019 1006   ALBUMIN 4.3 05/08/2017 1504   AST 23 10/30/2019 1006   ALT 27 10/30/2019 1006   ALKPHOS 63 10/30/2019 1006   BILITOT 0.5 10/30/2019 1006   BILITOT 0.5 05/08/2017 1504   GFRNONAA >60 10/30/2019 1006   GFRAA >60 10/30/2019 1006     Diabetic Labs (most recent): Lab Results  Component Value Date   HGBA1C 8.9 (A) 08/15/2021   HGBA1C 5.6 03/30/2021   HGBA1C 7.2 (H) 05/08/2017     Lipid Panel ( most recent) Lipid Panel     Component Value Date/Time   CHOL  07/28/2009 0400    141        ATP III CLASSIFICATION:  <200     mg/dL   Desirable  200-239  mg/dL   Borderline High  >=240    mg/dL   High          TRIG 149 07/28/2009 0500     Lipid Panel     Component Value Date/Time   CHOL  07/28/2009 0400    141        ATP III CLASSIFICATION:  <200     mg/dL   Desirable  200-239   mg/dL   Borderline High  >=240    mg/dL   High          TRIG 149 07/28/2009 0500      Assessment & Plan:   1. 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   Her diabetes was reclassified as type I after her surgery, was supposed to be on multiple daily injections of insulin, not consistent admittedly.   Her point-of-care A1c is 8.9% today.  Recent labs reviewed. - I had a long discussion with her about the  pathology behind her diabetes as well as its  complications. 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 These are all discussed in detail with her.  - I have  counseled her on diet  and weight management  by adopting a carbohydrate restricted/protein rich diet. Patient is encouraged to switch to  unprocessed or minimally processed     complex starch and increased protein intake (animal or plant source), fruits, and vegetables. -  she is advised to stick to a routine mealtimes to eat 3 meals  a day and avoid unnecessary snacks ( to snack only to correct hypoglycemia).   - she acknowledges that there is a room for improvement in her food and drink choices. - Suggestion is made for her to avoid simple carbohydrates  from her diet including Cakes, Sweet Desserts, Ice Cream, Soda (diet and regular), Sweet Tea, Candies, Chips, Cookies, Store Bought Juices, Alcohol in Excess of  1-2 drinks a day, Artificial Sweeteners,  Coffee Creamer, and "Sugar-free" Products. This will help patient to have more stable blood glucose profile and potentially avoid unintended weight gain.  - she will be scheduled with Jearld Fenton,  RDN, CDE for diabetes education.  - I have approached her with the following individualized plan to manage  her diabetes and patient agrees:   -In light of her pancreatectomy, she will be managed as type 1, and I had a long discussion with her about this approach.   -Accordingly, she is approached to lower her Tresiba to 30 units nightly, restart Humalog at 5  units 3 times a day with meals  for pre-meal BG readings of 90-150mg /dl, plus patient specific correction dose for unexpected hyperglycemia above 150mg /dl, associated with strict monitoring of glucose 4 times a day-before meals and at bedtime. -She will return in 2 weeks with her meter and logs. - she is warned not to take insulin without proper monitoring per orders. - Adjustment parameters are given to her for hypo and hyperglycemia in writing. -This patient will benefit from a CGM.  She will be considered for the freestyle libre device on her return. - she is encouraged to call clinic for blood glucose levels less than 70 or above 200 mg /dl. -Insulin will be her exclusive choice to treat diabetes.  She is not a candidate for GLP-1 receptor agonists, metformin, SGLT2 inhibitors.  - Specific targets for  A1c;  LDL, HDL,  and Triglycerides were discussed with the patient.  2) Blood Pressure /Hypertension:  her blood pressure is  controlled to target.    3) Lipids/Hyperlipidemia:   She has controlled lipid panel, not on statins. 4)  Weight/Diet:  Body mass index is 22.11 kg/m.  -    she is not a candidate for weight loss. I discussed with her the fact that loss of 5 - 10% of her  current body weight will have the most impact on her diabetes management.  Exercise, and detailed carbohydrates information provided  -  detailed on discharge instructions.  5) pancreatic insufficiency: Due to Whipple's procedure. She will benefit from continued intervention with Creon.  However, she will be considered for lower dose of Creon.  Until she finishes  her current supplies she is advised to continue 1 capsule of Creon 36,000 units 3 times daily with meals and with snacks.  Her next prescription will be Creon 24,000 3 times daily with meals.  6) Chronic Care/Health Maintenance:  -she  is not on ACEI/ARB and Statin medications , will be considered as appropriate on subsequent visits, and  is encouraged to initiate and continue to follow up with Ophthalmology, Dentist,  Podiatrist at least yearly or according to recommendations, and advised to  stay away from smoking. I have recommended yearly flu vaccine and pneumonia vaccine at least every 5 years; moderate intensity exercise for up to 150 minutes weekly; and  sleep for at least 7 hours a day.  - she is  advised to maintain close follow up with Carylon Perches, NP for primary care needs, as well as her other providers for optimal and coordinated care.   I spent 66 minutes in the care of the patient today including review of labs from [place], Lipids, Thyroid Function, Hematology (current and previous including abstractions from other facilities); face-to-face time discussing  her blood glucose readings/logs, discussing hypoglycemia and hyperglycemia episodes and symptoms, medications doses, her options of short and long term treatment based on the latest standards of care / guidelines;  discussion about incorporating lifestyle medicine;  and documenting the encounter.     Please refer to Patient Instructions for Blood Glucose Monitoring and Insulin/Medications Dosing Guide"  in media tab for additional information. Please  also refer to " Patient Self Inventory" in the Media  tab for reviewed elements of pertinent patient history.  [name] [name] participated in the discussions, expressed understanding, and voiced agreement with the above plans.  All questions were answered to her satisfaction. she is encouraged to contact clinic should she have any questions or concerns prior to her return  visit.   Follow up plan: - Return in about 2 weeks (around 08/29/2021) for F/U with Meter and Logs Only - no Labs.  Glade Lloyd, MD [facility] [address], [place] [address] Phone: [phone]  Fax: [phone]    08/15/2021, 12:34 PM  This note was partially dictated with voice recognition software. Similar sounding words can be transcribed inadequately or may not  be corrected upon review.

## 2021-08-15 NOTE — Patient Instructions (Signed)

## 2021-08-30 ENCOUNTER — Other Ambulatory Visit: Payer: Self-pay

## 2021-08-30 ENCOUNTER — Encounter: Payer: Self-pay | Admitting: "Endocrinology

## 2021-08-30 ENCOUNTER — Ambulatory Visit: Payer: Medicare HMO | Admitting: "Endocrinology

## 2021-08-30 VITALS — BP 112/76 | HR 72 | Ht 64.0 in | Wt 131.4 lb

## 2021-08-30 DIAGNOSIS — E1069 Type 1 diabetes mellitus with other specified complication: Secondary | ICD-10-CM | POA: Diagnosis not present

## 2021-08-30 DIAGNOSIS — K8689 Other specified diseases of pancreas: Secondary | ICD-10-CM | POA: Diagnosis not present

## 2021-08-30 MED ORDER — NOVOLOG FLEXPEN 100 UNIT/ML ~~LOC~~ SOPN: 8.0000 [IU] | PEN_INJECTOR | Freq: Three times a day (TID) | SUBCUTANEOUS | 1 refills | Status: DC

## 2021-08-30 MED ORDER — INSULIN LISPRO (1 UNIT DIAL) 100 UNIT/ML (KWIKPEN): 8.0000 [IU] | PEN_INJECTOR | Freq: Three times a day (TID) | SUBCUTANEOUS | 1 refills | Status: DC

## 2021-08-30 NOTE — Progress Notes (Signed)
Endocrinology Consult Note       08/30/2021, 12:15 PM   Subjective:    Patient ID: [name] [name], female    DOB: 1971/09/07.  [name] [name] is being seen in consultation for management of currently uncontrolled symptomatic diabetes requested by  Carylon Perches, NP.   Past Medical History:  Diagnosis Date   Acute bacterial bronchitis    dx 12-06-2014  --  FINISHED Z-PAC-  NO FEVER BUT STILL INTERMITTANT  PRODUCTIVE COUGH   Anxiety    Chronic pain    has pain specialist   Diabetes mellitus type I ([place])    GERD (gastroesophageal reflux disease)    History of chronic gastritis    History of chronic pancreatitis    multple recurrent--  s/p  whipple 2011   History of esophageal ulcer    History of kidney stones    History of subarachnoid hemorrhage    01-08-2013--  fell on concrete--  resolved without surgical intervention   History of suicide attempt    hx multiple attempts by overdosing of meds   IBS (irritable bowel syndrome)    Left ureteral calculus    Major depression, recurrent, chronic ([place])    w/  Hx  multiple suicide attempts (overdose)   Migraine headache    Type 2 diabetes mellitus, uncontrolled    Whipple disease 2011    Past Surgical History:  Procedure Laterality Date   CESAREAN SECTION  x2  last one 02-29-2000   last one w/ BILATERAL TUBAL LIGATION   COLONOSCOPY  03-14-2009   dx ileocolonscopy   CYSTOSCOPY WITH RETROGRADE PYELOGRAM, URETEROSCOPY AND STENT PLACEMENT Left 12/20/2014   Procedure: CYSTOSCOPY, LEFT URETEROSCOPY, STONE REMOVAL, LASER LITHOTRIPSY;  Surgeon: Rana Snare, MD;  Location: [facility];  Service: Urology;  Laterality: Left;   ESOPHAGOGASTRODUODENOSCOPY  last  one 12-31-2008   ESOPHAGOGASTRODUODENOSCOPY (EGD) WITH PROPOFOL N/A 05/28/2017   Procedure: ESOPHAGOGASTRODUODENOSCOPY (EGD) WITH PROPOFOL;  Surgeon: Rogene Houston, MD;  [facility]: [facility];  Service: Endoscopy;  Laterality: N/A;  2:00   HOLMIUM LASER APPLICATION Left [id number]   Procedure: HOLMIUM LASER APPLICATION;  Surgeon: Rana Snare, MD;  Location: [facility];  Service: Urology;  Laterality: Left;   LAPAROSCOPIC ASSISTED VAGINAL HYSTERECTOMY  12-16-2006   LAPAROSCOPIC NISSEN FUNDOPLICATION  Nov [id number]   w/  CHOLECYSTECTOMY   PORTACATH PLACEMENT N/A 07/12/2017   Procedure: INSERTION PORT-A-CATH;  Surgeon: Aviva Signs, MD;  Location: [facility];  Service: General;  Laterality: N/A;   REPAIR PERFORATED VISCUS  POST WHIPPLE  x5    last one 06-23-2012   TUBAL LIGATION     WHIPPLE PROCEDURE  2011   at [place] History   Marital status: Legally Separated    Spouse name: Not on file   Number of children: Not on file   Years of education: Not on file   Highest education level: Not on file  Occupational History   Not on file  Tobacco Use   Smoking status: Never   Smokeless tobacco: Never  Vaping Use   Vaping Use: Never used  Substance and Sexual Activity   Alcohol use: No   Drug use: Not Currently   Sexual activity: Yes    Birth control/protection: Surgical  Other Topics Concern   Not on file  Social History Narrative   Not on file   Social Determinants of Health   Financial Resource Strain: Not on file  Food Insecurity: Not on file  Transportation Needs: Not on file  Physical Activity: Not on file  Stress: Not on file  Social Connections: Not on file    Family History  Problem Relation Age of Onset   Hypertension Mother    Hyperlipidemia Mother    Diabetes Father    Stroke Father     Outpatient Encounter Medications as of 08/30/2021  Medication Sig   Accu-Chek FastClix Lancets MISC Use to monitor glucose 4 times daily   buprenorphine (BUTRANS) 10 MCG/HR PTWK 1 patch once a week.   CREON 36000-114000 units CPEP capsule Take 36,000 Units by mouth 3 (three) times daily with meals.   glucose blood  (ACCU-CHEK GUIDE) test strip Use as instructed   insulin degludec (TRESIBA FLEXTOUCH) 200 UNIT/ML FlexTouch Pen Inject 30 Units into the skin at bedtime.   insulin lispro (HUMALOG KWIKPEN) 100 UNIT/ML KwikPen Inject 8-11 Units into the skin 3 (three) times daily before meals.   LINZESS 290 MCG CAPS capsule Take 290 mcg by mouth daily.   nortriptyline (PAMELOR) 25 MG capsule Take 25 mg by mouth at bedtime.   oxyCODONE (OXY IR/ROXICODONE) 5 MG immediate release tablet Take 1 tablet by mouth 3 (three) times daily.   pantoprazole (PROTONIX) 40 MG tablet TAKE ONE TABLET BY MOUTH ONCE DAILY.   promethazine (PHENERGAN) 50 MG tablet TAKE 1 TABLET BY MOUTH EVERY 6 HOURS AS NEEDED FOR NAUSEA OR VOMITING.   zolpidem (AMBIEN) 10 MG tablet TAKE ONE TABLET BY MOUTH AT BEDTIME AS NEEDED FOR SLEEP. (Patient taking differently: 5 mg.)   [DISCONTINUED] clindamycin (CLEOCIN) 150 MG capsule Take one capsule po TID for 10 days (Patient not taking: Reported on 08/15/2021)   [DISCONTINUED] insulin lispro (HUMALOG KWIKPEN) 100 UNIT/ML KwikPen Inject 5-8 Units into the skin 3 (three) times daily before meals.   [DISCONTINUED] ondansetron (ZOFRAN ODT) 4 MG disintegrating tablet Take 1 tablet (4 mg total) by mouth every 8 (eight) hours as needed for nausea or vomiting. (Patient not taking: Reported on 08/15/2021)   [DISCONTINUED] penicillin v potassium (VEETID) 500 MG tablet Take one tablet po QID for 10 days (Patient not taking: Reported on 08/15/2021)   [DISCONTINUED] triamcinolone ointment (KENALOG) 0.5 % Apply 1 application topically 2 (two) times daily. (Patient not taking: Reported on 08/15/2021)   No facility-administered encounter medications on file as of 08/30/2021.    ALLERGIES: Allergies  Allergen Reactions   Buspar [Buspirone] Other (See Comments)    Dizziness   Metoclopramide Hcl Other (See Comments)    "lock-jaw"    VACCINATION STATUS: Immunization History  Administered Date(s) Administered   Influenza  Split 01/09/2013   Influenza,inj,Quad PF,6+ Mos 08/14/2013   Influenza-Unspecified 09/08/2011   Td 01/08/2013   Tdap 09/20/2011    Diabetes She presents for her follow-up diabetic visit. She has type 1 (Her diabetes started as type II, however after Whipple's procedure in 2011, reclassified as type I.) diabetes mellitus. Her disease course has been worsening. There are no hypoglycemic associated symptoms. Pertinent negatives for hypoglycemia include no confusion, headaches, pallor or seizures. Associated symptoms include fatigue, polydipsia and polyuria. Pertinent negatives for diabetes include no chest pain and  no polyphagia. There are no hypoglycemic complications. Symptoms are improving. Diabetic complications include peripheral neuropathy. Risk factors for coronary artery disease include diabetes mellitus. Current diabetic treatment includes insulin injections (She is currently on Tresiba 48 units nightly, Humalog up to 12 units 3 times daily AC.  However admittedly, she is not consistent using her Humalog.). Her weight is increasing steadily (Due to Whipple's procedure, she developed pancreatic insufficiency, currently on Creon 36,000 units 2 capsules daily AC, 1 capsule with snacks.). She is following a generally unhealthy diet. When asked about meal planning, she reported none. She has not had a previous visit with a dietitian. Her home blood glucose trend is decreasing steadily. Her breakfast blood glucose range is generally 140-180 mg/dl. Her lunch blood glucose range is generally 140-180 mg/dl. Her dinner blood glucose range is generally 140-180 mg/dl. Her bedtime blood glucose range is generally 140-180 mg/dl. Her overall blood glucose range is 140-180 mg/dl. (Naiomy presents with her meter showing average blood glucose improving to 164-200 mg/day from average of 400 during her last visit.  Her recent A1c was 8.9%.  She did not document any hypoglycemia.  She admits to inconsistent meal timing  and pattern which she is working on.  )    Review of Systems  Constitutional:  Positive for fatigue. Negative for chills, fever and unexpected weight change.  HENT:  Negative for trouble swallowing and voice change.   Eyes:  Negative for visual disturbance.  Respiratory:  Negative for cough, shortness of breath and wheezing.   Cardiovascular:  Negative for chest pain, palpitations and leg swelling.  Gastrointestinal:  Negative for diarrhea, nausea and vomiting.  Endocrine: Positive for polydipsia and polyuria. Negative for cold intolerance, heat intolerance and polyphagia.  Musculoskeletal:  Negative for arthralgias and myalgias.  Skin:  Negative for color change, pallor, rash and wound.  Neurological:  Negative for seizures and headaches.  Psychiatric/Behavioral:  Negative for confusion and suicidal ideas.    Objective:    Vitals with BMI 08/30/2021 08/15/2021 02/01/2021  Height 5\' 4"  5\' 4"  -  Weight 131 lbs 6 oz 128 lbs 13 oz -  BMI [id number] -  Systolic [id number]  Diastolic 76 84 83  Pulse 72 72 100    BP 112/76   Pulse 72   Ht 5\' 4"  (1.626 m)   Wt 131 lb 6.4 oz (59.6 kg)   BMI 22.55 kg/m   Wt Readings from Last 3 Encounters:  08/30/21 131 lb 6.4 oz (59.6 kg)  08/15/21 128 lb 12.8 oz (58.4 kg)  06/26/19 145 lb (65.8 kg)     Physical Exam Constitutional:      Appearance: She is well-developed.  HENT:     Head: Normocephalic and atraumatic.  Neck:     Thyroid: No thyromegaly.     Trachea: No tracheal deviation.  Cardiovascular:     Rate and Rhythm: Normal rate and regular rhythm.  Pulmonary:     Effort: Pulmonary effort is normal.     Breath sounds: Normal breath sounds.  Abdominal:     General: Bowel sounds are normal.     Tenderness: There is no abdominal tenderness. There is no guarding.  Musculoskeletal:        General: Normal range of motion.     Cervical back: Normal range of motion and neck supple.  Skin:    General: Skin is warm and dry.      Coloration: Skin is not pale.     Findings: No erythema  or rash.  Neurological:     Mental Status: She is alert and oriented to person, place, and time.     Cranial Nerves: No cranial nerve deficit.     Coordination: Coordination normal.     Deep Tendon Reflexes: Reflexes are normal and symmetric.  Psychiatric:        Judgment: Judgment normal.      CMP ( most recent) CMP     Component Value Date/Time   NA 135 10/30/2019 1006   NA 140 05/08/2017 1504   K 4.2 10/30/2019 1006   CL 107 10/30/2019 1006   CO2 19 (L) 10/30/2019 1006   GLUCOSE 139 (H) 10/30/2019 1006   BUN 17 05/11/2021 0000   CREATININE 0.7 05/11/2021 0000   CREATININE 0.48 10/30/2019 1006   CALCIUM 8.4 (L) 10/30/2019 1006   PROT 6.7 10/30/2019 1006   PROT 7.6 05/08/2017 1504   ALBUMIN 3.4 (L) 10/30/2019 1006   ALBUMIN 4.3 05/08/2017 1504   AST 23 10/30/2019 1006   ALT 27 10/30/2019 1006   ALKPHOS 63 10/30/2019 1006   BILITOT 0.5 10/30/2019 1006   BILITOT 0.5 05/08/2017 1504   GFRNONAA >60 10/30/2019 1006   GFRAA >60 10/30/2019 1006     Diabetic Labs (most recent): Lab Results  Component Value Date   HGBA1C 8.9 (A) 08/15/2021   HGBA1C 5.6 03/30/2021   HGBA1C 7.2 (H) 05/08/2017     Lipid Panel ( most recent) Lipid Panel     Component Value Date/Time   CHOL  07/28/2009 0400    141        ATP III CLASSIFICATION:  <200     mg/dL   Desirable  200-239  mg/dL   Borderline High  >=240    mg/dL   High          TRIG 149 07/28/2009 0500     Lipid Panel     Component Value Date/Time   CHOL  07/28/2009 0400    141        ATP III CLASSIFICATION:  <200     mg/dL   Desirable  200-239  mg/dL   Borderline High  >=240    mg/dL   High          TRIG 149 07/28/2009 0500      Assessment & Plan:   1. Type 1 diabetes mellitus with other specified complication (HCC)   - [name] [name] has currently uncontrolled symptomatic pancreatic diabetes following pancreatectomy (Whipple procedure) after  recurrent painful pancreatitis in 2011.   [name] [name] presents with her meter showing average blood glucose improving to 164-200 mg/day from average of 400 during her last visit.  Her recent A1c was 8.9%.  She did not document any hypoglycemia.  She admits to inconsistent meal timing and pattern which she is working on.     Recent labs reviewed. - I had a long discussion with her about the  pathology behind her diabetes as well as its  complications. 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 These are all discussed in detail with her.  - I have counseled her on diet  and weight management  by adopting a carbohydrate restricted/protein rich diet. Patient is encouraged to switch to  unprocessed or minimally processed     complex starch and increased protein intake (animal or plant source), fruits, and  vegetables. -  she is advised to stick to a routine mealtimes to eat 3 meals  a day and avoid unnecessary snacks ( to snack only to correct hypoglycemia).   - she acknowledges that there is a room for improvement in her food and drink choices. - Suggestion is made for her to avoid simple carbohydrates  from her diet including Cakes, Sweet Desserts, Ice Cream, Soda (diet and regular), Sweet Tea, Candies, Chips, Cookies, Store Bought Juices, Alcohol in Excess of  1-2 drinks a day, Artificial Sweeteners,  Coffee Creamer, and "Sugar-free" Products, Lemonade. This will help patient to have more stable blood glucose profile and potentially avoid unintended weight gain.   - she will be scheduled with Jearld Fenton, RDN, CDE for diabetes education.  - I have approached her with the following individualized plan to manage  her diabetes and patient agrees:   -In light of her pancreatectomy, she will be managed as type 1,  and I had a long discussion with her about this approach.   -She has a high risk for late breakfast, which affects the decision to optimize her basal insulin.  For now, she is advised to continue Tresiba 30 units nightly, advised to increase her Humalog to 8   units 3 times a day with meals  for pre-meal BG readings of 90-150mg /dl, plus patient specific correction dose for unexpected hyperglycemia above 150mg /dl, associated with strict monitoring of glucose 4 times a day-before meals and at bedtime. -She will return in 2 weeks with her meter and logs. - she is warned not to take insulin without proper monitoring per orders. - Adjustment parameters are given to her for hypo and hyperglycemia in writing. -This patient will benefit from a CGM.  After her presentation with proper documentation of blood glucose, she will be considered for the freestyle libre device.  - she is encouraged to call clinic for blood glucose levels less than 70 or above 200 mg /dl. -Insulin will be her exclusive choice to treat diabetes.  She is not a candidate for GLP-1 receptor agonists, metformin, SGLT2 inhibitors.  - Specific targets for  A1c;  LDL, HDL,  and Triglycerides were discussed with the patient.  2) Blood Pressure /Hypertension:  her blood pressure is  controlled to target.    3) Lipids/Hyperlipidemia:   She has controlled lipid panel, not on statins. 4)  Weight/Diet:  Body mass index is 22.55 kg/m.  -He has gained 4 pounds since last visit which is appropriate for her.  she is not a candidate for weight loss. I discussed with her the fact that loss of 5 - 10% of her  current body weight will have the most impact on her diabetes management.  Exercise, and detailed carbohydrates information provided  -  detailed on discharge instructions.  5) pancreatic insufficiency: Due to Whipple's procedure. She will benefit from continued intervention with Creon.  However, she will be considered for lower dose of Creon.   Until she finishes her current supplies she is advised to continue 1 capsule of Creon 36,000 units 3 times daily with meals and with snacks.  Her next prescription will be Creon 24,000 3 times daily with meals.  6) Chronic Care/Health Maintenance:  -she  is not on ACEI/ARB and Statin medications , will be considered as appropriate on subsequent visits, and  is encouraged to initiate and continue to follow up with Ophthalmology, Dentist,  Podiatrist at least yearly or according to recommendations, and advised to   stay away  from smoking. I have recommended yearly flu vaccine and pneumonia vaccine at least every 5 years; moderate intensity exercise for up to 150 minutes weekly; and  sleep for at least 7 hours a day.  - she is  advised to maintain close follow up with Carylon Perches, NP for primary care needs, as well as her other providers for optimal and coordinated care.     I spent 75minutes in the care of the patient today including review of labs from [place], Lipids, Thyroid Function, Hematology (current and previous including abstractions from other facilities); face-to-face time discussing  her blood glucose readings/logs, discussing hypoglycemia and hyperglycemia episodes and symptoms, medications doses, her options of short and long term treatment based on the latest standards of care / guidelines;  discussion about incorporating lifestyle medicine;  and documenting the encounter.    Please refer to Patient Instructions for Blood Glucose Monitoring and Insulin/Medications Dosing Guide"  in media tab for additional information. Please  also refer to " Patient Self Inventory" in the Media  tab for reviewed elements of pertinent patient history.  Salihah R Covington participated in the discussions, expressed understanding, and voiced agreement with the above plans.  All questions were answered to her satisfaction. she is encouraged to contact clinic should she have any questions or concerns prior to her  return visit.   Follow up plan: - Return in about 10 days (around 09/09/2021) for F/U with Meter and Logs Only - no Labs.  Glade Lloyd, MD [facility] [address] [place], [place] [address] Phone: [phone]  Fax: [phone]    08/30/2021, 12:15 PM  This note was partially dictated with voice recognition software. Similar sounding words can be transcribed inadequately or may not  be corrected upon review.

## 2021-09-11 ENCOUNTER — Ambulatory Visit: Payer: Medicare HMO | Admitting: "Endocrinology

## 2021-09-21 ENCOUNTER — Ambulatory Visit: Payer: Medicare HMO | Admitting: "Endocrinology

## 2022-09-17 ENCOUNTER — Other Ambulatory Visit (HOSPITAL_COMMUNITY): Payer: Self-pay | Admitting: Nurse Practitioner

## 2022-09-17 DIAGNOSIS — Z1231 Encounter for screening mammogram for malignant neoplasm of breast: Secondary | ICD-10-CM

## 2022-09-27 ENCOUNTER — Inpatient Hospital Stay (HOSPITAL_COMMUNITY): Admission: RE | Admit: 2022-09-27 | Payer: Medicare HMO | Source: Ambulatory Visit

## 2022-11-29 ENCOUNTER — Ambulatory Visit (HOSPITAL_COMMUNITY): Payer: Medicare HMO

## 2023-01-02 ENCOUNTER — Ambulatory Visit (INDEPENDENT_AMBULATORY_CARE_PROVIDER_SITE_OTHER): Payer: Medicare HMO | Admitting: Family Medicine

## 2023-01-02 ENCOUNTER — Other Ambulatory Visit: Payer: Self-pay

## 2023-01-02 ENCOUNTER — Ambulatory Visit (HOSPITAL_COMMUNITY): Admission: RE | Admit: 2023-01-02 | Payer: Medicare HMO | Source: Ambulatory Visit

## 2023-01-02 VITALS — BP 130/72 | HR 86 | Temp 98.4°F | Ht 64.0 in | Wt 120.0 lb

## 2023-01-02 DIAGNOSIS — Z1211 Encounter for screening for malignant neoplasm of colon: Secondary | ICD-10-CM

## 2023-01-02 DIAGNOSIS — Z95828 Presence of other vascular implants and grafts: Secondary | ICD-10-CM | POA: Insufficient documentation

## 2023-01-02 DIAGNOSIS — Z13220 Encounter for screening for lipoid disorders: Secondary | ICD-10-CM

## 2023-01-02 DIAGNOSIS — E1069 Type 1 diabetes mellitus with other specified complication: Secondary | ICD-10-CM | POA: Diagnosis not present

## 2023-01-02 NOTE — Assessment & Plan Note (Signed)
Will arrange port flushes.

## 2023-01-02 NOTE — Progress Notes (Signed)
Subjective:  Patient ID: [name] [name], female    DOB: Mar 04, 1971  Age: 52 y.o. MRN: [id number]  CC: Chief Complaint  Patient presents with   Establish Care    HPI:  52 year old female presents to establish care.  Patient was previously seen by practice.  She has an extensive past medical history including chronic pancreatitis and pancreatic insufficiency, type 1 diabetes, chronic pain, insomnia.  Patient has a few concerns today.  Patient states that she needs an endocrinologist.  She states that she is currently using long-acting insulin but does not know the name/brand.  She is also taking a short acting insulin but once again does not know the name or brand.  She states that she is taking 40 units of long-acting insulin and typically 20 units of short acting insulin 3 times a day with meals.  Patient wants to see an endocrinologist in [place].  Patient also reports that she has a port in place.  This is due to poor intravenous access.  She needs port flushes to be arranged.  Patient also states that she needs a referral to GI.  It was recommended that she have an upper endoscopy.  She also needs a screening colonoscopy.  Patient Active Problem List   Diagnosis Date Noted   Port-A-Cath in place 01/02/2023   Pancreatic insufficiency 08/15/2021   Insomnia 06/28/2014   Type 1 diabetes mellitus with other specified complication ([place]) [id number]   Pain management 12/25/2012   BIPOLAR DISORDER UNSPECIFIED 02/03/2009   Esophageal reflux 02/03/2009    Social Hx   Social History   Socioeconomic History   Marital status: Legally Separated    Spouse name: Not on file   Number of children: Not on file   Years of education: Not on file   Highest education level: Not on file  Occupational History   Not on file  Tobacco Use   Smoking status: Never   Smokeless tobacco: Never  Vaping Use   Vaping Use: Never used  Substance and Sexual Activity   Alcohol use: No   Drug use:  Not Currently   Sexual activity: Yes    Birth control/protection: Surgical  Other Topics Concern   Not on file  Social History Narrative   Not on file   Social Determinants of Health   Financial Resource Strain: Not on file  Food Insecurity: Not on file  Transportation Needs: Not on file  Physical Activity: Not on file  Stress: Not on file  Social Connections: Not on file    Review of Systems  Constitutional: Negative.   Gastrointestinal:        Chronic abdominal pain.   Objective:  BP 130/72   Pulse 86   Temp 98.4 F (36.9 C)   Ht 5\' 4"  (1.626 m)   Wt 120 lb (54.4 kg)   SpO2 99%   BMI 20.60 kg/m      01/02/2023   10:18 AM 08/30/2021   10:14 AM 08/15/2021   10:07 AM  BP/Weight  Systolic BP [id number]  Diastolic BP 72 76 84  Wt. (Lbs) 120 131.4 128.8  BMI 20.6 kg/m2 22.55 kg/m2 22.11 kg/m2    Physical Exam Vitals and nursing note reviewed.  Constitutional:      General: She is not in acute distress.    Appearance: Normal appearance.  HENT:     Head: Normocephalic and atraumatic.  Eyes:     General:        Right eye: No  discharge.        Left eye: No discharge.     Conjunctiva/sclera: Conjunctivae normal.  Cardiovascular:     Rate and Rhythm: Normal rate and regular rhythm.  Pulmonary:     Effort: Pulmonary effort is normal.     Breath sounds: Normal breath sounds. No wheezing or rales.  Abdominal:     General: There is no distension.     Palpations: Abdomen is soft.     Tenderness: There is no abdominal tenderness.  Neurological:     Mental Status: She is alert.     Lab Results  Component Value Date   WBC 5.4 10/30/2019   HGB 13.1 10/30/2019   HCT 39.4 10/30/2019   PLT 209 10/30/2019   GLUCOSE 139 (H) 10/30/2019   CHOL  07/28/2009    141        ATP III CLASSIFICATION:  <200     mg/dL   Desirable  200-239  mg/dL   Borderline High  >=240    mg/dL   High          TRIG 149 07/28/2009   ALT 27 10/30/2019   AST 23 10/30/2019   NA 135  10/30/2019   K 4.2 10/30/2019   CL 107 10/30/2019   CREATININE 0.7 05/11/2021   BUN 17 05/11/2021   CO2 19 (L) 10/30/2019   INR 1.14 01/08/2013   HGBA1C 8.9 (A) 08/15/2021     Assessment & Plan:   Problem List Items Addressed This Visit       Endocrine   Type 1 diabetes mellitus with other specified complication ([place])    Unsure of control and compliance.  Labs today. Referring to endocrinology.      Relevant Orders   Ambulatory referral to Endocrinology   CMP14+EGFR   Hemoglobin A1c   Microalbumin / creatinine urine ratio     Other   Port-A-Cath in place - Primary    Will arrange port flushes.      Other Visit Diagnoses     Screening, lipid       Relevant Orders   CBC   Lipid panel   Encounter for screening colonoscopy       Relevant Orders   Ambulatory referral to Gastroenterology       Follow-up:  Return in about 6 months (around 07/05/2023).  [place]

## 2023-01-02 NOTE — Patient Instructions (Signed)
Labs today.  Referrals placed.  Nurses are working on get port flush scheduled.

## 2023-01-02 NOTE — Assessment & Plan Note (Signed)
Unsure of control and compliance.  Labs today. Referring to endocrinology.

## 2023-01-03 ENCOUNTER — Encounter (INDEPENDENT_AMBULATORY_CARE_PROVIDER_SITE_OTHER): Payer: Self-pay | Admitting: *Deleted

## 2023-01-11 DIAGNOSIS — K861 Other chronic pancreatitis: Secondary | ICD-10-CM | POA: Diagnosis not present

## 2023-01-11 DIAGNOSIS — E1042 Type 1 diabetes mellitus with diabetic polyneuropathy: Secondary | ICD-10-CM | POA: Diagnosis not present

## 2023-01-11 DIAGNOSIS — R109 Unspecified abdominal pain: Secondary | ICD-10-CM | POA: Diagnosis not present

## 2023-01-11 DIAGNOSIS — Z79899 Other long term (current) drug therapy: Secondary | ICD-10-CM | POA: Diagnosis not present

## 2023-01-11 DIAGNOSIS — R030 Elevated blood-pressure reading, without diagnosis of hypertension: Secondary | ICD-10-CM | POA: Diagnosis not present

## 2023-01-11 DIAGNOSIS — Z6821 Body mass index (BMI) 21.0-21.9, adult: Secondary | ICD-10-CM | POA: Diagnosis not present

## 2023-01-11 DIAGNOSIS — G8929 Other chronic pain: Secondary | ICD-10-CM | POA: Diagnosis not present

## 2023-01-11 DIAGNOSIS — G63 Polyneuropathy in diseases classified elsewhere: Secondary | ICD-10-CM | POA: Diagnosis not present

## 2023-01-11 DIAGNOSIS — G894 Chronic pain syndrome: Secondary | ICD-10-CM | POA: Diagnosis not present

## 2023-01-11 DIAGNOSIS — E109 Type 1 diabetes mellitus without complications: Secondary | ICD-10-CM | POA: Diagnosis not present

## 2023-01-31 ENCOUNTER — Other Ambulatory Visit (HOSPITAL_COMMUNITY)
Admission: RE | Admit: 2023-01-31 | Discharge: 2023-01-31 | Disposition: A | Discharge status: Home / Self Care | Payer: Medicare HMO | Source: Ambulatory Visit | Attending: Family Medicine | Admitting: Family Medicine

## 2023-01-31 VITALS — BP 119/78 | HR 102 | Temp 98.4°F | Resp 20

## 2023-01-31 DIAGNOSIS — Z95828 Presence of other vascular implants and grafts: Secondary | ICD-10-CM | POA: Diagnosis not present

## 2023-01-31 MED ORDER — HEPARIN SOD (PORK) LOCK FLUSH 100 UNIT/ML IV SOLN
500.0000 [IU] | Freq: Once | INTRAVENOUS | Status: AC | PRN
  Administered 2023-01-31: 500 [IU]
  Filled 2023-01-31: qty 5

## 2023-01-31 MED ORDER — SODIUM CHLORIDE 0.9% FLUSH
10.0000 mL | Freq: Once | INTRAVENOUS | Status: AC | PRN
  Administered 2023-01-31: 10 mL

## 2023-01-31 NOTE — Progress Notes (Signed)
Diagnosis: Port A Cath in Place  Provider:  Everlene Other DO  Procedure:  Port flush and lock  IV Type: Port a Cath, IV Location: L Chest  Flushed with 500 units of heparin.  Post Infusion IV Care: Port a Cath Deaccessed/Flushed  Discharge: Condition: Stable, Destination: Home . AVS Declined  Performed by:  Wyvonne Lenz, RN

## 2023-02-11 DIAGNOSIS — K861 Other chronic pancreatitis: Secondary | ICD-10-CM | POA: Diagnosis not present

## 2023-02-11 DIAGNOSIS — E1042 Type 1 diabetes mellitus with diabetic polyneuropathy: Secondary | ICD-10-CM | POA: Diagnosis not present

## 2023-02-11 DIAGNOSIS — R030 Elevated blood-pressure reading, without diagnosis of hypertension: Secondary | ICD-10-CM | POA: Diagnosis not present

## 2023-02-11 DIAGNOSIS — R109 Unspecified abdominal pain: Secondary | ICD-10-CM | POA: Diagnosis not present

## 2023-02-11 DIAGNOSIS — F1120 Opioid dependence, uncomplicated: Secondary | ICD-10-CM | POA: Diagnosis not present

## 2023-02-11 DIAGNOSIS — Z6821 Body mass index (BMI) 21.0-21.9, adult: Secondary | ICD-10-CM | POA: Diagnosis not present

## 2023-02-11 DIAGNOSIS — E109 Type 1 diabetes mellitus without complications: Secondary | ICD-10-CM | POA: Diagnosis not present

## 2023-02-11 DIAGNOSIS — G8929 Other chronic pain: Secondary | ICD-10-CM | POA: Diagnosis not present

## 2023-02-11 DIAGNOSIS — G63 Polyneuropathy in diseases classified elsewhere: Secondary | ICD-10-CM | POA: Diagnosis not present

## 2023-02-21 ENCOUNTER — Other Ambulatory Visit: Payer: Medicare HMO

## 2023-02-26 ENCOUNTER — Ambulatory Visit: Payer: Medicare HMO | Admitting: "Endocrinology

## 2023-03-14 DIAGNOSIS — F1120 Opioid dependence, uncomplicated: Secondary | ICD-10-CM | POA: Diagnosis not present

## 2023-03-14 DIAGNOSIS — R109 Unspecified abdominal pain: Secondary | ICD-10-CM | POA: Diagnosis not present

## 2023-03-14 DIAGNOSIS — K861 Other chronic pancreatitis: Secondary | ICD-10-CM | POA: Diagnosis not present

## 2023-03-14 DIAGNOSIS — G63 Polyneuropathy in diseases classified elsewhere: Secondary | ICD-10-CM | POA: Diagnosis not present

## 2023-03-14 DIAGNOSIS — E1042 Type 1 diabetes mellitus with diabetic polyneuropathy: Secondary | ICD-10-CM | POA: Diagnosis not present

## 2023-03-14 DIAGNOSIS — E109 Type 1 diabetes mellitus without complications: Secondary | ICD-10-CM | POA: Diagnosis not present

## 2023-03-14 DIAGNOSIS — G8929 Other chronic pain: Secondary | ICD-10-CM | POA: Diagnosis not present

## 2023-03-14 DIAGNOSIS — R030 Elevated blood-pressure reading, without diagnosis of hypertension: Secondary | ICD-10-CM | POA: Diagnosis not present

## 2023-03-14 DIAGNOSIS — Z79899 Other long term (current) drug therapy: Secondary | ICD-10-CM | POA: Diagnosis not present

## 2023-03-14 DIAGNOSIS — Z6821 Body mass index (BMI) 21.0-21.9, adult: Secondary | ICD-10-CM | POA: Diagnosis not present

## 2023-04-10 DIAGNOSIS — Z6821 Body mass index (BMI) 21.0-21.9, adult: Secondary | ICD-10-CM | POA: Diagnosis not present

## 2023-04-10 DIAGNOSIS — R1013 Epigastric pain: Secondary | ICD-10-CM | POA: Diagnosis not present

## 2023-04-10 DIAGNOSIS — E1042 Type 1 diabetes mellitus with diabetic polyneuropathy: Secondary | ICD-10-CM | POA: Diagnosis not present

## 2023-04-10 DIAGNOSIS — G63 Polyneuropathy in diseases classified elsewhere: Secondary | ICD-10-CM | POA: Diagnosis not present

## 2023-04-10 DIAGNOSIS — R030 Elevated blood-pressure reading, without diagnosis of hypertension: Secondary | ICD-10-CM | POA: Diagnosis not present

## 2023-04-10 DIAGNOSIS — E109 Type 1 diabetes mellitus without complications: Secondary | ICD-10-CM | POA: Diagnosis not present

## 2023-04-10 DIAGNOSIS — R109 Unspecified abdominal pain: Secondary | ICD-10-CM | POA: Diagnosis not present

## 2023-04-10 DIAGNOSIS — G8929 Other chronic pain: Secondary | ICD-10-CM | POA: Diagnosis not present

## 2023-04-10 DIAGNOSIS — Z79899 Other long term (current) drug therapy: Secondary | ICD-10-CM | POA: Diagnosis not present

## 2023-04-10 DIAGNOSIS — F1120 Opioid dependence, uncomplicated: Secondary | ICD-10-CM | POA: Diagnosis not present

## 2023-04-15 DIAGNOSIS — Z79899 Other long term (current) drug therapy: Secondary | ICD-10-CM | POA: Diagnosis not present

## 2023-04-25 ENCOUNTER — Encounter (HOSPITAL_COMMUNITY): Payer: Medicare HMO

## 2023-04-29 ENCOUNTER — Other Ambulatory Visit: Payer: Self-pay

## 2023-04-30 ENCOUNTER — Encounter (HOSPITAL_COMMUNITY): Payer: Medicare HMO

## 2023-05-01 ENCOUNTER — Ambulatory Visit: Payer: Medicare HMO

## 2023-05-06 ENCOUNTER — Encounter (HOSPITAL_COMMUNITY): Admission: RE | Admit: 2023-05-06 | Payer: Medicare HMO | Source: Ambulatory Visit

## 2023-05-14 DIAGNOSIS — K861 Other chronic pancreatitis: Secondary | ICD-10-CM | POA: Diagnosis not present

## 2023-05-14 DIAGNOSIS — R109 Unspecified abdominal pain: Secondary | ICD-10-CM | POA: Diagnosis not present

## 2023-05-14 DIAGNOSIS — R1013 Epigastric pain: Secondary | ICD-10-CM | POA: Diagnosis not present

## 2023-05-14 DIAGNOSIS — R030 Elevated blood-pressure reading, without diagnosis of hypertension: Secondary | ICD-10-CM | POA: Diagnosis not present

## 2023-05-14 DIAGNOSIS — E109 Type 1 diabetes mellitus without complications: Secondary | ICD-10-CM | POA: Diagnosis not present

## 2023-05-14 DIAGNOSIS — E1042 Type 1 diabetes mellitus with diabetic polyneuropathy: Secondary | ICD-10-CM | POA: Diagnosis not present

## 2023-05-14 DIAGNOSIS — F1120 Opioid dependence, uncomplicated: Secondary | ICD-10-CM | POA: Diagnosis not present

## 2023-05-14 DIAGNOSIS — G894 Chronic pain syndrome: Secondary | ICD-10-CM | POA: Diagnosis not present

## 2023-05-14 DIAGNOSIS — Z6820 Body mass index (BMI) 20.0-20.9, adult: Secondary | ICD-10-CM | POA: Diagnosis not present

## 2023-05-17 ENCOUNTER — Encounter (INDEPENDENT_AMBULATORY_CARE_PROVIDER_SITE_OTHER): Payer: Self-pay | Admitting: Gastroenterology

## 2023-05-17 ENCOUNTER — Ambulatory Visit (INDEPENDENT_AMBULATORY_CARE_PROVIDER_SITE_OTHER): Payer: Medicare HMO | Admitting: Gastroenterology

## 2023-05-17 VITALS — BP 123/79 | HR 80 | Temp 97.1°F | Ht 63.0 in | Wt 119.2 lb

## 2023-05-17 DIAGNOSIS — Z1211 Encounter for screening for malignant neoplasm of colon: Secondary | ICD-10-CM

## 2023-05-17 DIAGNOSIS — K8689 Other specified diseases of pancreas: Secondary | ICD-10-CM

## 2023-05-17 DIAGNOSIS — K909 Intestinal malabsorption, unspecified: Secondary | ICD-10-CM | POA: Diagnosis not present

## 2023-05-17 DIAGNOSIS — E1043 Type 1 diabetes mellitus with diabetic autonomic (poly)neuropathy: Secondary | ICD-10-CM

## 2023-05-17 DIAGNOSIS — K5903 Drug induced constipation: Secondary | ICD-10-CM

## 2023-05-17 DIAGNOSIS — K3184 Gastroparesis: Secondary | ICD-10-CM | POA: Insufficient documentation

## 2023-05-17 MED ORDER — PANCRELIPASE (LIP-PROT-AMYL) 36000-114000 UNITS PO CPEP
36000.0000 [IU] | ORAL_CAPSULE | Freq: Three times a day (TID) | ORAL | 2 refills | Status: DC
Start: 2023-05-17 — End: 2023-05-22

## 2023-05-17 MED ORDER — PROMETHAZINE HCL 50 MG PO TABS: ORAL_TABLET | ORAL | 3 refills | Status: DC

## 2023-05-17 MED ORDER — PEG 3350-KCL-NA BICARB-NACL 420 G PO SOLR: 4000.0000 mL | Freq: Once | ORAL | 0 refills | Status: AC

## 2023-05-17 NOTE — Patient Instructions (Signed)
It was very nice to meet you today, as dicussed with will plan for the following :  1) prescribed anti nausea meds and Pancreas enzyme 2) colonoscopy  Diet Recommendations for Gastroparesis:  Small, Frequent Meals: Eat smaller meals more often to reduce the burden on the stomach. Low-Fat Foods: Fat slows gastric emptying; opt for low-fat options. Low-Fiber Foods: Fiber can be difficult to digest; choose low-fiber foods to ease symptoms. Soft or Liquid Foods: Easier to digest, such as soups, smoothies, and pureed foods. Chew Food Thoroughly: Helps with digestion and reduces strain on the stomach. Hydration: Drink plenty of water throughout the day.  Foods to Consider: Lean proteins: Chicken, fish, tofu. Refined grains: White bread, white rice, pasta. Cooked vegetables: Without skins or seeds. Low-fat dairy: Milk, yogurt.  Foods to Avoid: High-fat foods: Fried foods, creamy sauces. High-fiber foods: Raw vegetables, whole grains. Carbonated beverages: Can cause bloating.  Additional Tips: Sit up after eating: To help with digestion. Use a blender: To make food easier to consume.

## 2023-05-17 NOTE — H&P (View-Only) (Signed)
 Vista Lawman , M.D. Gastroenterology & Hepatology [facility] [address] [place], [place] [address] Primary Care Physician: Tommie Sams, DO [address] [place] [address]  Chief Complaint: Greasy stools  History of Present Illness: [name] [name] is a 52 y.o. female with type 1 diabetes, chronic pancreatitis status post whipple 2011, ventral hernia containing transverse colon who presents for evaluation of steatorrhea, diabetic gastroparesis, constipation and colorectal cancer screening.  Patient has complex history and was previously followed at [address] [facility] by gastroenterologist.  Patient reports today that since she ran out of Zenpep(pancrelipase) her stools are oily and floats in the toilet bowl.  When she takes an Zenpep she denies any steatorrhea.  Patient reports currently she has no constipation having regular bowel movement daily without any difficulty, although she is not taking any laxatives.  She is on a chronic opioid regimen with oxycodone and long-acting narcotic and is managed by pain management.  Patient reports her diet is excellent , she is able to eat at least 3 meals a day.  Sometimes she becomes nauseous at the end of the day but takes Phenergan which is the only thing that works for her  Previously patient suffer from abdominal pain nausea vomiting and constipation was seen at [facility] where she underwent endoscopy gastric emptying test which was positive for delayed emptying, was treated empirically for SIBO with rifaximin, previously had an adequate A1c.   Last EGD:2020  n: - Normal esophagus. - A gastrojejunostomy was found, characterized by healthy  appearing mucosa. - Food in stomach. - Normal stomach mucosa. Biopsied. - Normal examined jejunum. Food noted.   Last Colonoscopy: Reports over 5 years ago but unable to find the actual report  FHx: neg for any gastrointestinal/liver  disease, no malignancies Social: neg smoking, alcohol or illicit drug use Surgical:Whipple , multiple hernia repair  Past Medical History: Past Medical History:  Diagnosis Date   Acute bacterial bronchitis    dx 12-06-2014  --  FINISHED Z-PAC-  NO FEVER BUT STILL INTERMITTANT  PRODUCTIVE COUGH   Anxiety    Chronic pain    has pain specialist   Diabetes mellitus type I (HCC)    GERD (gastroesophageal reflux disease)    History of chronic gastritis    History of chronic pancreatitis    multple recurrent--  s/p  whipple 2011   History of esophageal ulcer    History of kidney stones    History of subarachnoid hemorrhage    01-08-2013--  fell on concrete--  resolved without surgical intervention   History of suicide attempt    hx multiple attempts by overdosing of meds   IBS (irritable bowel syndrome)    Left ureteral calculus    Major depression, recurrent, chronic (HCC)    w/  Hx  multiple suicide attempts (overdose)   Migraine headache    Type 2 diabetes mellitus, uncontrolled    Whipple disease 2011    Past Surgical History: Past Surgical History:  Procedure Laterality Date   CESAREAN SECTION  x2  last one 02-29-2000   last one w/ BILATERAL TUBAL LIGATION   COLONOSCOPY  03-14-2009   dx ileocolonscopy   CYSTOSCOPY WITH RETROGRADE PYELOGRAM, URETEROSCOPY AND STENT PLACEMENT Left 12/20/2014   Procedure: CYSTOSCOPY, LEFT URETEROSCOPY, STONE REMOVAL, LASER LITHOTRIPSY;  Surgeon: Barron Alvine, MD;  Location: [facility];  Service: Urology;  Laterality: Left;   ESOPHAGOGASTRODUODENOSCOPY  last  one 12-31-2008  ESOPHAGOGASTRODUODENOSCOPY (EGD) WITH PROPOFOL N/A 05/28/2017   Procedure: ESOPHAGOGASTRODUODENOSCOPY (EGD) WITH PROPOFOL;  Surgeon: Malissa Hippo, MD;  [facility];  Service: Endoscopy;  Laterality: N/A;  2:00   HOLMIUM LASER APPLICATION Left 12/20/2014   Procedure: HOLMIUM LASER APPLICATION;  Surgeon: Barron Alvine, MD;  Location: [facility];  Service: Urology;  Laterality: Left;   LAPAROSCOPIC ASSISTED VAGINAL HYSTERECTOMY  12-16-2006   LAPAROSCOPIC NISSEN FUNDOPLICATION  Nov 2001   w/  CHOLECYSTECTOMY   PORTACATH PLACEMENT N/A 07/12/2017   Procedure: INSERTION PORT-A-CATH;  Surgeon: Franky Macho, MD;  Location: [facility];  Service: General;  Laterality: N/A;   REPAIR PERFORATED VISCUS  POST WHIPPLE  x5    last one 06-23-2012   TUBAL LIGATION     WHIPPLE PROCEDURE  2011   at [facility]    Family History: Family History  Problem Relation Age of Onset   Hypertension Mother    Hyperlipidemia Mother    Diabetes Father    Stroke Father     Social History: Social History   Tobacco Use  Smoking Status Never  Smokeless Tobacco Never   Social History   Substance and Sexual Activity  Alcohol Use No   Social History   Substance and Sexual Activity  Drug Use Not Currently    Allergies: Allergies  Allergen Reactions   Buspar [Buspirone] Other (See Comments)    Dizziness   Metoclopramide Hcl Other (See Comments)    "lock-jaw"    Medications: Current Outpatient Medications  Medication Sig Dispense Refill   Accu-Chek FastClix Lancets MISC Use to monitor glucose 4 times daily 150 each 1   amitriptyline (ELAVIL) 25 MG tablet Take 25 mg by mouth daily.     glucose blood (ACCU-CHEK GUIDE) test strip Use as instructed 150 each 2   oxyCODONE (OXY IR/ROXICODONE) 5 MG immediate release tablet Take 1 tablet by mouth 3 (three) times daily.     XTAMPZA ER 9 MG C12A Take 1 capsule by mouth every 12 (twelve) hours.     pantoprazole (PROTONIX) 40 MG tablet TAKE ONE TABLET BY MOUTH ONCE DAILY. (Patient not taking: Reported on 05/17/2023) 30 tablet 5   promethazine (PHENERGAN) 50 MG tablet TAKE 1 TABLET BY MOUTH EVERY 6 HOURS AS NEEDED FOR NAUSEA OR VOMITING. (Patient not taking: Reported on 05/17/2023) 30 tablet 3   No current facility-administered medications for this visit.    Review of Systems: GENERAL: negative for  malaise, night sweats HEENT: No changes in hearing or vision, no nose bleeds or other nasal problems. NECK: Negative for lumps, goiter, pain and significant neck swelling RESPIRATORY: Negative for cough, wheezing CARDIOVASCULAR: Negative for chest pain, leg swelling, palpitations, orthopnea GI: SEE HPI MUSCULOSKELETAL: Negative for joint pain or swelling, back pain, and muscle pain. SKIN: Negative for lesions, rash HEMATOLOGY Negative for prolonged bleeding, bruising easily, and swollen nodes. ENDOCRINE: Negative for cold or heat intolerance, polyuria, polydipsia and goiter. NEURO: negative for tremor, gait imbalance, syncope and seizures. The remainder of the review of systems is noncontributory.   Physical Exam: BP 123/79 (BP Location: Left Arm, Patient Position: Sitting, Cuff Size: Normal)   Pulse 80   Temp (!) 97.1 F (36.2 C) (Temporal)   Ht 5\' 3"  (1.6 m)   Wt 119 lb 3.2 oz (54.1 kg)   BMI 21.12 kg/m  GENERAL: The patient is AO x3, in no acute distress. HEENT: Head is normocephalic and atraumatic. EOMI are intact. Mouth is well hydrated and without lesions. NECK: Supple.  No masses LUNGS: Clear to auscultation. No presence of rhonchi/wheezing/rales. Adequate chest expansion HEART: RRR, normal s1 and s2. ABDOMEN: Soft, nontender, no guarding, no peritoneal signs, and nondistended. BS +. No masses. Multiple scar tissue with ventral hernia  EXTREMITIES: Without any cyanosis, clubbing, rash, lesions or edema NEUROLOGIC: AOx3, no focal motor deficit. SKIN: no jaundice, no rashes   Imaging/Labs: as above  I personally reviewed and interpreted the available labs, imaging and endoscopic files.  2020  The stomach is located normally in the left upper abdomen.   Solid gastric emptying at   57 minutes is 20% (normal range at   60 minutes  is >10%).  Solid gastric emptying at 118 minutes is 35% (normal range at 120 minutes  is >40%).  Solid gastric emptying at 234 minutes is  43% (normal range at 240 minutes  is >90%).   Impression :   Solid gastric emptying is normal at 57 minutes and delayed at 118 and 234  minutes; see above discussion.   Impression and Plan:  [name] [name] is a 52 y.o. female with type 1 diabetes, chronic pancreatitis status post whipple 2011, ventral hernia containing transverse colon who presents for evaluation of steatorrhea, diabetic gastroparesis, constipation and colorectal cancer screening.  # Steatorrhea # Pancreatic insufficiency  Patient is known to have pancreatic insufficiency due to having a chronic pancreatitis due to type 1 diabetes status post Whipple's procedure  Prescribed Zenpep as patient was unable to tolerate Creon previously because of abdominal pain and worsening nausea.  Reports she did well for years   Low-fat diet and pancrelipase before every meal  # Gastroparesis  Patient reports excellent in appetite presently.  Her symptoms are well-controlled by lifestyle modification as she eats small and frequent meals and takes Phenergan as needed which is the only thing works for  Advise adequate glucose control.  Diet Recommendations for Gastroparesis: Small, Frequent Meals: Eat smaller meals more often to reduce the burden on the stomach. Low-Fat Foods: Fat slows gastric emptying; opt for low-fat options. Low-Fiber Foods: Fiber can be difficult to digest; choose low-fiber foods to ease symptoms. Soft or Liquid Foods: Easier to digest, such as soups, smoothies, and pureed foods. Chew Food Thoroughly: Helps with digestion and reduces strain on the stomach. Hydration: Drink plenty of water throughout the day.  Foods to Consider: Lean proteins: Chicken, fish, tofu. Refined grains: White bread, white rice, pasta. Cooked vegetables: Without skins or seeds. Low-fat dairy: Milk, yogurt.  Foods to Avoid: High-fat foods: Fried foods, creamy sauces. High-fiber foods: Raw vegetables, whole grains. Carbonated  beverages: Can cause bloating.  Additional Tips: Sit up after eating: To help with digestion. Use a blender: To make food easier to consume.  # Constipation  Patient is on narcotics (oxycodone and long-acting opioids)  previously suffered with constipation but currently her symptoms are well-controlled without taking any laxatives  #CRC Screening  Last colonoscopy he reported to be over 5 years ago and patient states do not right now.  I am unable to open any colonoscopy report  Will proceed with colorectal cancer screening.  Patient has ventral hernia for surgical evaluation was advised that her colonoscopy may be challenging and there is a small risk of incarcerated hernia given transverse colon and hernia sac  All questions were answered.      Vista Lawman, MD Gastroenterology and Hepatology [facility]   This chart has been completed using [employer] Dictation software, and while attempts have been made  to ensure accuracy , certain words and phrases may not be transcribed as intended

## 2023-05-17 NOTE — Progress Notes (Signed)
Vista Lawman , M.D. Gastroenterology & Hepatology [facility] [address] [place], [place] [address] Primary Care Physician: Tommie Sams, DO [address] [place] [address]  Chief Complaint: Greasy stools  History of Present Illness: [name] [name] is a 52 y.o. female with type 1 diabetes, chronic pancreatitis status post whipple 2011, ventral hernia containing transverse colon who presents for evaluation of steatorrhea, diabetic gastroparesis, constipation and colorectal cancer screening.  Patient has complex history and was previously followed at [address] [facility] by gastroenterologist.  Patient reports today that since she ran out of Zenpep(pancrelipase) her stools are oily and floats in the toilet bowl.  When she takes an Zenpep she denies any steatorrhea.  Patient reports currently she has no constipation having regular bowel movement daily without any difficulty, although she is not taking any laxatives.  She is on a chronic opioid regimen with oxycodone and long-acting narcotic and is managed by pain management.  Patient reports her diet is excellent , she is able to eat at least 3 meals a day.  Sometimes she becomes nauseous at the end of the day but takes Phenergan which is the only thing that works for her  Previously patient suffer from abdominal pain nausea vomiting and constipation was seen at [facility] where she underwent endoscopy gastric emptying test which was positive for delayed emptying, was treated empirically for SIBO with rifaximin, previously had an adequate A1c.   Last EGD:2020  n: - Normal esophagus. - A gastrojejunostomy was found, characterized by healthy  appearing mucosa. - Food in stomach. - Normal stomach mucosa. Biopsied. - Normal examined jejunum. Food noted.   Last Colonoscopy: Reports over 5 years ago but unable to find the actual report  FHx: neg for any gastrointestinal/liver  disease, no malignancies Social: neg smoking, alcohol or illicit drug use Surgical:Whipple , multiple hernia repair  Past Medical History: Past Medical History:  Diagnosis Date   Acute bacterial bronchitis    dx 12-06-2014  --  FINISHED Z-PAC-  NO FEVER BUT STILL INTERMITTANT  PRODUCTIVE COUGH   Anxiety    Chronic pain    has pain specialist   Diabetes mellitus type I (HCC)    GERD (gastroesophageal reflux disease)    History of chronic gastritis    History of chronic pancreatitis    multple recurrent--  s/p  whipple 2011   History of esophageal ulcer    History of kidney stones    History of subarachnoid hemorrhage    01-08-2013--  fell on concrete--  resolved without surgical intervention   History of suicide attempt    hx multiple attempts by overdosing of meds   IBS (irritable bowel syndrome)    Left ureteral calculus    Major depression, recurrent, chronic (HCC)    w/  Hx  multiple suicide attempts (overdose)   Migraine headache    Type 2 diabetes mellitus, uncontrolled    Whipple disease 2011    Past Surgical History: Past Surgical History:  Procedure Laterality Date   CESAREAN SECTION  x2  last one 02-29-2000   last one w/ BILATERAL TUBAL LIGATION   COLONOSCOPY  03-14-2009   dx ileocolonscopy   CYSTOSCOPY WITH RETROGRADE PYELOGRAM, URETEROSCOPY AND STENT PLACEMENT Left 12/20/2014   Procedure: CYSTOSCOPY, LEFT URETEROSCOPY, STONE REMOVAL, LASER LITHOTRIPSY;  Surgeon: Barron Alvine, MD;  Location: [facility];  Service: Urology;  Laterality: Left;   ESOPHAGOGASTRODUODENOSCOPY  last  one 12-31-2008  ESOPHAGOGASTRODUODENOSCOPY (EGD) WITH PROPOFOL N/A 05/28/2017   Procedure: ESOPHAGOGASTRODUODENOSCOPY (EGD) WITH PROPOFOL;  Surgeon: Malissa Hippo, MD;  [facility];  Service: Endoscopy;  Laterality: N/A;  2:00   HOLMIUM LASER APPLICATION Left 12/20/2014   Procedure: HOLMIUM LASER APPLICATION;  Surgeon: Barron Alvine, MD;  Location: [facility];  Service: Urology;  Laterality: Left;   LAPAROSCOPIC ASSISTED VAGINAL HYSTERECTOMY  12-16-2006   LAPAROSCOPIC NISSEN FUNDOPLICATION  Nov 2001   w/  CHOLECYSTECTOMY   PORTACATH PLACEMENT N/A 07/12/2017   Procedure: INSERTION PORT-A-CATH;  Surgeon: Franky Macho, MD;  Location: [facility];  Service: General;  Laterality: N/A;   REPAIR PERFORATED VISCUS  POST WHIPPLE  x5    last one 06-23-2012   TUBAL LIGATION     WHIPPLE PROCEDURE  2011   at [facility]    Family History: Family History  Problem Relation Age of Onset   Hypertension Mother    Hyperlipidemia Mother    Diabetes Father    Stroke Father     Social History: Social History   Tobacco Use  Smoking Status Never  Smokeless Tobacco Never   Social History   Substance and Sexual Activity  Alcohol Use No   Social History   Substance and Sexual Activity  Drug Use Not Currently    Allergies: Allergies  Allergen Reactions   Buspar [Buspirone] Other (See Comments)    Dizziness   Metoclopramide Hcl Other (See Comments)    "lock-jaw"    Medications: Current Outpatient Medications  Medication Sig Dispense Refill   Accu-Chek FastClix Lancets MISC Use to monitor glucose 4 times daily 150 each 1   amitriptyline (ELAVIL) 25 MG tablet Take 25 mg by mouth daily.     glucose blood (ACCU-CHEK GUIDE) test strip Use as instructed 150 each 2   oxyCODONE (OXY IR/ROXICODONE) 5 MG immediate release tablet Take 1 tablet by mouth 3 (three) times daily.     XTAMPZA ER 9 MG C12A Take 1 capsule by mouth every 12 (twelve) hours.     pantoprazole (PROTONIX) 40 MG tablet TAKE ONE TABLET BY MOUTH ONCE DAILY. (Patient not taking: Reported on 05/17/2023) 30 tablet 5   promethazine (PHENERGAN) 50 MG tablet TAKE 1 TABLET BY MOUTH EVERY 6 HOURS AS NEEDED FOR NAUSEA OR VOMITING. (Patient not taking: Reported on 05/17/2023) 30 tablet 3   No current facility-administered medications for this visit.    Review of Systems: GENERAL: negative for  malaise, night sweats HEENT: No changes in hearing or vision, no nose bleeds or other nasal problems. NECK: Negative for lumps, goiter, pain and significant neck swelling RESPIRATORY: Negative for cough, wheezing CARDIOVASCULAR: Negative for chest pain, leg swelling, palpitations, orthopnea GI: SEE HPI MUSCULOSKELETAL: Negative for joint pain or swelling, back pain, and muscle pain. SKIN: Negative for lesions, rash HEMATOLOGY Negative for prolonged bleeding, bruising easily, and swollen nodes. ENDOCRINE: Negative for cold or heat intolerance, polyuria, polydipsia and goiter. NEURO: negative for tremor, gait imbalance, syncope and seizures. The remainder of the review of systems is noncontributory.   Physical Exam: BP 123/79 (BP Location: Left Arm, Patient Position: Sitting, Cuff Size: Normal)   Pulse 80   Temp (!) 97.1 F (36.2 C) (Temporal)   Ht 5\' 3"  (1.6 m)   Wt 119 lb 3.2 oz (54.1 kg)   BMI 21.12 kg/m  GENERAL: The patient is AO x3, in no acute distress. HEENT: Head is normocephalic and atraumatic. EOMI are intact. Mouth is well hydrated and without lesions. NECK: Supple.  No masses LUNGS: Clear to auscultation. No presence of rhonchi/wheezing/rales. Adequate chest expansion HEART: RRR, normal s1 and s2. ABDOMEN: Soft, nontender, no guarding, no peritoneal signs, and nondistended. BS +. No masses. Multiple scar tissue with ventral hernia  EXTREMITIES: Without any cyanosis, clubbing, rash, lesions or edema NEUROLOGIC: AOx3, no focal motor deficit. SKIN: no jaundice, no rashes   Imaging/Labs: as above  I personally reviewed and interpreted the available labs, imaging and endoscopic files.  2020  The stomach is located normally in the left upper abdomen.   Solid gastric emptying at   57 minutes is 20% (normal range at   60 minutes  is >10%).  Solid gastric emptying at 118 minutes is 35% (normal range at 120 minutes  is >40%).  Solid gastric emptying at 234 minutes is  43% (normal range at 240 minutes  is >90%).   Impression :   Solid gastric emptying is normal at 57 minutes and delayed at 118 and 234  minutes; see above discussion.   Impression and Plan:  [name] [name] is a 52 y.o. female with type 1 diabetes, chronic pancreatitis status post whipple 2011, ventral hernia containing transverse colon who presents for evaluation of steatorrhea, diabetic gastroparesis, constipation and colorectal cancer screening.  # Steatorrhea # Pancreatic insufficiency  Patient is known to have pancreatic insufficiency due to having a chronic pancreatitis due to type 1 diabetes status post Whipple's procedure  Prescribed Zenpep as patient was unable to tolerate Creon previously because of abdominal pain and worsening nausea.  Reports she did well for years   Low-fat diet and pancrelipase before every meal  # Gastroparesis  Patient reports excellent in appetite presently.  Her symptoms are well-controlled by lifestyle modification as she eats small and frequent meals and takes Phenergan as needed which is the only thing works for  Advise adequate glucose control.  Diet Recommendations for Gastroparesis: Small, Frequent Meals: Eat smaller meals more often to reduce the burden on the stomach. Low-Fat Foods: Fat slows gastric emptying; opt for low-fat options. Low-Fiber Foods: Fiber can be difficult to digest; choose low-fiber foods to ease symptoms. Soft or Liquid Foods: Easier to digest, such as soups, smoothies, and pureed foods. Chew Food Thoroughly: Helps with digestion and reduces strain on the stomach. Hydration: Drink plenty of water throughout the day.  Foods to Consider: Lean proteins: Chicken, fish, tofu. Refined grains: White bread, white rice, pasta. Cooked vegetables: Without skins or seeds. Low-fat dairy: Milk, yogurt.  Foods to Avoid: High-fat foods: Fried foods, creamy sauces. High-fiber foods: Raw vegetables, whole grains. Carbonated  beverages: Can cause bloating.  Additional Tips: Sit up after eating: To help with digestion. Use a blender: To make food easier to consume.  # Constipation  Patient is on narcotics (oxycodone and long-acting opioids)  previously suffered with constipation but currently her symptoms are well-controlled without taking any laxatives  #CRC Screening  Last colonoscopy he reported to be over 5 years ago and patient states do not right now.  I am unable to open any colonoscopy report  Will proceed with colorectal cancer screening.  Patient has ventral hernia for surgical evaluation was advised that her colonoscopy may be challenging and there is a small risk of incarcerated hernia given transverse colon and hernia sac  All questions were answered.      Vista Lawman, MD Gastroenterology and Hepatology [facility]   This chart has been completed using [employer] Dict[employer] software, and while attempts have been made  to ensure accuracy , certain words and phrases may not be transcribed as intended

## 2023-05-21 ENCOUNTER — Other Ambulatory Visit (INDEPENDENT_AMBULATORY_CARE_PROVIDER_SITE_OTHER): Payer: Self-pay | Admitting: *Deleted

## 2023-05-21 ENCOUNTER — Telehealth (INDEPENDENT_AMBULATORY_CARE_PROVIDER_SITE_OTHER): Payer: Self-pay | Admitting: *Deleted

## 2023-05-21 ENCOUNTER — Encounter (INDEPENDENT_AMBULATORY_CARE_PROVIDER_SITE_OTHER): Payer: Self-pay | Admitting: Gastroenterology

## 2023-05-21 DIAGNOSIS — E1143 Type 2 diabetes mellitus with diabetic autonomic (poly)neuropathy: Secondary | ICD-10-CM

## 2023-05-21 MED ORDER — PROMETHAZINE HCL 50 MG PO TABS
ORAL_TABLET | ORAL | 3 refills | Status: DC
Start: 2023-05-21 — End: 2023-05-22

## 2023-05-21 NOTE — Telephone Encounter (Signed)
Fax from [facility] asking for clarification. Rx for promethazine 50mg  tab take one every 6 hours as needed for nausea and vomiting exceeds the max recommended dose of 100-150/day. Please clarify.

## 2023-05-21 NOTE — Telephone Encounter (Signed)
Hi [name] [name] that was patient previous dose which I had reordered  She should take max recommended dose of 100-150/day

## 2023-05-21 NOTE — Telephone Encounter (Signed)
Med sent in.

## 2023-05-21 NOTE — Telephone Encounter (Signed)
May I resent rx with new directions so pharmacy will fill. Do you want directions of q 8 hours prn.

## 2023-05-21 NOTE — Telephone Encounter (Signed)
Yes that's correct. Thank you 

## 2023-05-22 ENCOUNTER — Telehealth (INDEPENDENT_AMBULATORY_CARE_PROVIDER_SITE_OTHER): Payer: Self-pay | Admitting: *Deleted

## 2023-05-22 ENCOUNTER — Other Ambulatory Visit (INDEPENDENT_AMBULATORY_CARE_PROVIDER_SITE_OTHER): Payer: Self-pay | Admitting: *Deleted

## 2023-05-22 ENCOUNTER — Other Ambulatory Visit (INDEPENDENT_AMBULATORY_CARE_PROVIDER_SITE_OTHER): Payer: Self-pay | Admitting: Gastroenterology

## 2023-05-22 DIAGNOSIS — E1143 Type 2 diabetes mellitus with diabetic autonomic (poly)neuropathy: Secondary | ICD-10-CM

## 2023-05-22 MED ORDER — ZENPEP 15000-47000 UNITS PO CPEP: 40000.0000 [IU] | ORAL_CAPSULE | ORAL | 2 refills | Status: DC

## 2023-05-22 MED ORDER — PROMETHAZINE HCL 50 MG PO TABS
ORAL_TABLET | ORAL | 3 refills | Status: DC
Start: 2023-05-22 — End: 2024-04-06

## 2023-05-22 MED ORDER — PROMETHAZINE HCL 50 MG PO TABS
ORAL_TABLET | ORAL | 3 refills | Status: DC
Start: 2023-05-22 — End: 2023-05-22

## 2023-05-22 MED ORDER — ZENPEP 40000-126000 UNITS PO CPEP: ORAL_CAPSULE | ORAL | 2 refills | Status: DC

## 2023-05-22 NOTE — Telephone Encounter (Signed)
Called pt to discuss message. I let her know promethazine 50mg  was sent in for #90 and directions had to be one q8 hours instead of 6 due to being max dose. She is ok with getting number 90 instead of #180. States she cannot take creon due to side effects. States she is taking zenpep 40,000 q 4 hours. Her promethazine was sent to mail order so I called and canceled and sent to [place] per her request.   [facility]

## 2023-05-22 NOTE — Telephone Encounter (Signed)
Rx sent to [facility] and called brandon at pharmacy and notified him on vm to cancel previous rx and new one was sent.

## 2023-05-22 NOTE — Telephone Encounter (Signed)
Yes please

## 2023-05-22 NOTE — Telephone Encounter (Signed)
Brandon from [facility] called to clarify rx sent in for zenpep. The way it was sent in they have to split capsule which they are unable to do. Pt states she was taking [id number] and I see your note that you sent what she previously was on.   Do you want me to send in zenpep [id number] take one q 4 #180 with 2 refills?

## 2023-05-29 ENCOUNTER — Ambulatory Visit: Payer: Medicare HMO

## 2023-05-29 ENCOUNTER — Encounter (INDEPENDENT_AMBULATORY_CARE_PROVIDER_SITE_OTHER): Payer: Self-pay

## 2023-05-29 ENCOUNTER — Telehealth (INDEPENDENT_AMBULATORY_CARE_PROVIDER_SITE_OTHER): Payer: Self-pay | Admitting: Gastroenterology

## 2023-05-29 NOTE — Telephone Encounter (Signed)
 Letter mailed to pt with pre op appt

## 2023-05-31 ENCOUNTER — Ambulatory Visit (INDEPENDENT_AMBULATORY_CARE_PROVIDER_SITE_OTHER): Payer: Medicare HMO

## 2023-05-31 VITALS — Ht 63.0 in | Wt 120.0 lb

## 2023-05-31 DIAGNOSIS — Z0000 Encounter for general adult medical examination without abnormal findings: Secondary | ICD-10-CM | POA: Diagnosis not present

## 2023-05-31 DIAGNOSIS — Z1231 Encounter for screening mammogram for malignant neoplasm of breast: Secondary | ICD-10-CM

## 2023-05-31 DIAGNOSIS — Z0100 Encounter for examination of eyes and vision without abnormal findings: Secondary | ICD-10-CM

## 2023-05-31 NOTE — Patient Instructions (Addendum)
Ms. [name] [name] , Thank you for taking time to come for your Medicare Wellness Visit. I appreciate your ongoing commitment to your health goals. Please review the following plan we discussed and let me know if I can assist you in the future.   Referrals/Orders/Follow-Ups/Clinician Recommendations:  You have an order for:  []   2D Mammogram  [x]   3D Mammogram  []   Bone Density   []   Lung Cancer Screening  Please call for appointment:   [facility] [address] [place], [place] [address] [phone]  Make sure to wear two-piece clothing.  No lotions powders or deodorants the day of the appointment Make sure to bring picture ID and insurance card.  Bring list of medications you are currently taking including any supplements.   Schedule your [facility] screening mammogram through MyChart!   Log into your MyChart account.  Go to 'Visit' (or 'Appointments' if on mobile App) --> Schedule an Appointment  Under 'Select a Reason for Visit' choose the Mammogram Screening option.  Complete the pre-visit questions and select the time and place that best fits your schedule.  You have been referred to see an eye doctor to have your yearly eye exam. If you haven't heard from them in the next 7 business days, please call their office to schedule your appointment.  Dr. Sinda Du [address] [place] [address] PHONE [phone]   This is a list of the screening recommended for you and due dates:  Health Maintenance  Topic Date Due   COVID-19 Vaccine (1) Never done   Complete foot exam   Never done   Eye exam for diabetics  Never done   Yearly kidney health urinalysis for diabetes  Never done   Zoster (Shingles) Vaccine (1 of 2) Never done   Colon Cancer Screening  Never done   Mammogram  Never done   Hemoglobin A1C  02/12/2022   Yearly kidney function blood test for diabetes  05/11/2022   DTaP/Tdap/Td vaccine (3 - Td or Tdap) 01/09/2023   Flu  Shot  05/09/2023   Medicare Annual Wellness Visit  05/30/2024   Hepatitis C Screening  Completed   HIV Screening  Completed   HPV Vaccine  Aged Out    Advanced directives: (Declined) Advance directive discussed with you today. Even though you declined this today, please call our office should you change your mind, and we can give you the proper paperwork for you to fill out.  Next Medicare Annual Wellness Visit scheduled for next year: Yes  Managing Pain Without Opioids Opioids are strong medicines used to treat moderate to severe pain. For some people, especially those who have long-term (chronic) pain, opioids may not be the best choice for pain management due to: Side effects like nausea, constipation, and sleepiness. The risk of addiction (opioid use disorder). The longer you take opioids, the greater your risk of addiction. Pain that lasts for more than 3 months is called chronic pain. Managing chronic pain usually requires more than one approach and is often provided by a team of health care providers working together (multidisciplinary approach). Pain management may be done at a pain management center or pain clinic. How to manage pain without the use of opioids Use non-opioid medicines Non-opioid medicines for pain may include: Over-the-counter or prescription non-steroidal anti-inflammatory drugs (NSAIDs). These may be the first medicines used for pain. They work well for muscle and bone pain, and they reduce swelling. Acetaminophen.  This over-the-counter medicine may work well for milder pain but not swelling. Antidepressants. These may be used to treat chronic pain. A certain type of antidepressant (tricyclics) is often used. These medicines are given in lower doses for pain than when used for depression. Anticonvulsants. These are usually used to treat seizures but may also reduce nerve (neuropathic) pain. Muscle relaxants. These relieve pain caused by sudden muscle tightening  (spasms). You may also use a pain medicine that is applied to the skin as a patch, cream, or gel (topical analgesic), such as a numbing medicine. These may cause fewer side effects than medicines taken by mouth. Do certain therapies as directed Some therapies can help with pain management. They include: Physical therapy. You will do exercises to gain strength and flexibility. A physical therapist may teach you exercises to move and stretch parts of your body that are weak, stiff, or painful. You can learn these exercises at physical therapy visits and practice them at home. Physical therapy may also involve: Massage. Heat wraps or applying heat or cold to affected areas. Electrical signals that interrupt pain signals (transcutaneous electrical nerve stimulation, TENS). Weak lasers that reduce pain and swelling (low-level laser therapy). Signals from your body that help you learn to regulate pain (biofeedback). Occupational therapy. This helps you to learn ways to function at home and work with less pain. Recreational therapy. This involves trying new activities or hobbies, such as a physical activity or drawing. Mental health therapy, including: Cognitive behavioral therapy (CBT). This helps you learn coping skills for dealing with pain. Acceptance and commitment therapy (ACT) to change the way you think and react to pain. Relaxation therapies, including muscle relaxation exercises and mindfulness-based stress reduction. Pain management counseling. This may be individual, family, or group counseling.  Receive medical treatments Medical treatments for pain management include: Nerve block injections. These may include a pain blocker and anti-inflammatory medicines. You may have injections: Near the spine to relieve chronic back or neck pain. Into joints to relieve back or joint pain. Into nerve areas that supply a painful area to relieve body pain. Into muscles (trigger point injections) to  relieve some painful muscle conditions. A medical device placed near your spine to help block pain signals and relieve nerve pain or chronic back pain (spinal cord stimulation device). Acupuncture. Follow these instructions at home Medicines Take over-the-counter and prescription medicines only as told by your health care provider. If you are taking pain medicine, ask your health care providers about possible side effects to watch out for. Do not drive or use heavy machinery while taking prescription opioid pain medicine. Lifestyle  Do not use drugs or alcohol to reduce pain. If you drink alcohol, limit how much you have to: 0-1 drink a day for women who are not pregnant. 0-2 drinks a day for men. Know how much alcohol is in a drink. In the [place] one drink equals one 12 oz bottle of beer (355 mL), one 5 oz glass of wine (148 mL), or one 1 oz glass of hard liquor (44 mL). Do not use any products that contain nicotine or tobacco. These products include cigarettes, chewing tobacco, and vaping devices, such as e-cigarettes. If you need help quitting, ask your health care provider. Eat a healthy diet and maintain a healthy weight. Poor diet and excess weight may make pain worse. Eat foods that are high in fiber. These include fresh fruits and vegetables, whole grains, and beans. Limit foods that are high in fat and  processed sugars, such as fried and sweet foods. Exercise regularly. Exercise lowers stress and may help relieve pain. Ask your health care provider what activities and exercises are safe for you. If your health care provider approves, join an exercise class that combines movement and stress reduction. Examples include yoga and tai chi. Get enough sleep. Lack of sleep may make pain worse. Lower stress as much as possible. Practice stress reduction techniques as told by your therapist. General instructions Work with all your pain management providers to find the treatments that work  best for you. You are an important member of your pain management team. There are many things you can do to reduce pain on your own. Consider joining an online or in-person support group for people who have chronic pain. Keep all follow-up visits. This is important. Where to find more information You can find more information about managing pain without opioids from: American Academy of Pain Medicine: painmed.org Institute for Chronic Pain: instituteforchronicpain.org American Chronic Pain Association: theacpa.org Contact a health care provider if: You have side effects from pain medicine. Your pain gets worse or does not get better with treatments or home therapy. You are struggling with anxiety or depression. Summary Many types of pain can be managed without opioids. Chronic pain may respond better to pain management without opioids. Pain is best managed when you and a team of health care providers work together. Pain management without opioids may include non-opioid medicines, medical treatments, physical therapy, mental health therapy, and lifestyle changes. Tell your health care providers if your pain gets worse or is not being managed well enough. This information is not intended to replace advice given to you by your health care provider. Make sure you discuss any questions you have with your health care provider. Document Revised: 01/04/2021 Document Reviewed: 01/04/2021 Elsevier Patient Education  2024 [employer].  Preventive Care 53-28 Years Old, Female Preventive care refers to lifestyle choices and visits with your health care provider that can promote health and wellness. Preventive care visits are also called wellness exams. What can I expect for my preventive care visit? Counseling Your health care provider may ask you questions about your: Medical history, including: Past medical problems. Family medical history. Pregnancy history. Current health,  including: Menstrual cycle. Method of birth control. Emotional well-being. Home life and relationship well-being. Sexual activity and sexual health. Lifestyle, including: Alcohol, nicotine or tobacco, and drug use. Access to firearms. Diet, exercise, and sleep habits. Work and work Astronomer. Sunscreen use. Safety issues such as seatbelt and bike helmet use. Physical exam Your health care provider will check your: Height and weight. These may be used to calculate your BMI (body mass index). BMI is a measurement that tells if you are at a healthy weight. Waist circumference. This measures the distance around your waistline. This measurement also tells if you are at a healthy weight and may help predict your risk of certain diseases, such as type 2 diabetes and high blood pressure. Heart rate and blood pressure. Body temperature. Skin for abnormal spots. What immunizations do I need?  Vaccines are usually given at various ages, according to a schedule. Your health care provider will recommend vaccines for you based on your age, medical history, and lifestyle or other factors, such as travel or where you work. What tests do I need? Screening Your health care provider may recommend screening tests for certain conditions. This may include: Lipid and cholesterol levels. Diabetes screening. This is done by checking your blood  sugar (glucose) after you have not eaten for a while (fasting). Pelvic exam and Pap test. Hepatitis B test. Hepatitis C test. HIV (human immunodeficiency virus) test. STI (sexually transmitted infection) testing, if you are at risk. Lung cancer screening. Colorectal cancer screening. Mammogram. Talk with your health care provider about when you should start having regular mammograms. This may depend on whether you have a family history of breast cancer. BRCA-related cancer screening. This may be done if you have a family history of breast, ovarian, tubal, or  peritoneal cancers. Bone density scan. This is done to screen for osteoporosis. Talk with your health care provider about your test results, treatment options, and if necessary, the need for more tests. Follow these instructions at home: Eating and drinking  Eat a diet that includes fresh fruits and vegetables, whole grains, lean protein, and low-fat dairy products. Take vitamin and mineral supplements as recommended by your health care provider. Do not drink alcohol if: Your health care provider tells you not to drink. You are pregnant, may be pregnant, or are planning to become pregnant. If you drink alcohol: Limit how much you have to 0-1 drink a day. Know how much alcohol is in your drink. In the [place] one drink equals one 12 oz bottle of beer (355 mL), one 5 oz glass of wine (148 mL), or one 1 oz glass of hard liquor (44 mL). Lifestyle Brush your teeth every morning and night with fluoride toothpaste. Floss one time each day. Exercise for at least 30 minutes 5 or more days each week. Do not use any products that contain nicotine or tobacco. These products include cigarettes, chewing tobacco, and vaping devices, such as e-cigarettes. If you need help quitting, ask your health care provider. Do not use drugs. If you are sexually active, practice safe sex. Use a condom or other form of protection to prevent STIs. If you do not wish to become pregnant, use a form of birth control. If you plan to become pregnant, see your health care provider for a prepregnancy visit. Take aspirin only as told by your health care provider. Make sure that you understand how much to take and what form to take. Work with your health care provider to find out whether it is safe and beneficial for you to take aspirin daily. Find healthy ways to manage stress, such as: Meditation, yoga, or listening to music. Journaling. Talking to a trusted person. Spending time with friends and family. Minimize exposure to UV  radiation to reduce your risk of skin cancer. Safety Always wear your seat belt while driving or riding in a vehicle. Do not drive: If you have been drinking alcohol. Do not ride with someone who has been drinking. When you are tired or distracted. While texting. If you have been using any mind-altering substances or drugs. Wear a helmet and other protective equipment during sports activities. If you have firearms in your house, make sure you follow all gun safety procedures. Seek help if you have been physically or sexually abused. What's next? Visit your health care provider once a year for an annual wellness visit. Ask your health care provider how often you should have your eyes and teeth checked. Stay up to date on all vaccines. This information is not intended to replace advice given to you by your health care provider. Make sure you discuss any questions you have with your health care provider. Document Revised: 03/22/2021 Document Reviewed: 03/22/2021 Elsevier Patient Education  2024 [employer].

## 2023-05-31 NOTE — Progress Notes (Signed)
 Because this visit was a virtual/telehealth visit,  certain criteria was not obtained, such a blood pressure, CBG if patient is a diabetic, and timed get up and go. Any medications not marked as "taking" was not mentioned during the medication reconciliation part of the visit. Any vitals not documented were not able to be obtained due to this being a telehealth visit. Vitals that have been documented are verbally provided by the patient.  Patient was unable to self-report a recent blood pressure reading due to a lack of equipment at home via telehealth.  Subjective:   [name] [name] is a 52 y.o. female who presents for Medicare Annual (Subsequent) preventive examination.  Visit Complete: Virtual  I connected with  Tashonda R Renk on 05/31/23 by a audio enabled telemedicine application and verified that I am speaking with the correct person using two identifiers.  Patient Location: Home  Provider Location: Office/Clinic  I discussed the limitations of evaluation and management by telemedicine. The patient expressed understanding and agreed to proceed.  Patient Medicare AWV questionnaire was completed by the patient on na; I have confirmed that all information answered by patient is correct and no changes since this date.  Review of Systems     Cardiac Risk Factors include: advanced age (>71men, >50 women);diabetes mellitus;sedentary lifestyle     Objective:    Today's Vitals   05/31/23 1017 05/31/23 1019  Weight: 120 lb (54.4 kg)   Height: 5\' 3"  (1.6 m)   PainSc:  5    Body mass index is 21.26 kg/m.     05/31/2023   10:17 AM 06/26/2019    9:33 AM 07/23/2018    3:02 PM 06/29/2018   11:47 AM 06/19/2018    9:21 AM 02/28/2018    6:10 PM 07/12/2017    7:51 AM  Advanced Directives  Does Patient Have a Medical Advance Directive? No No No No No No No  Would patient like information on creating a medical advance directive? No - Patient declined  No - Patient declined No - Patient  declined  No - Patient declined No - Patient declined    Current Medications (verified) Outpatient Encounter Medications as of 05/31/2023  Medication Sig   amitriptyline (ELAVIL) 25 MG tablet Take 25 mg by mouth daily.   oxyCODONE (OXY IR/ROXICODONE) 5 MG immediate release tablet Take 1 tablet by mouth 3 (three) times daily.   promethazine (PHENERGAN) 50 MG tablet TAKE 1 TABLET BY MOUTH EVERY 8 HOURS AS NEEDED FOR NAUSEA OR VOMITING.   XTAMPZA ER 9 MG C12A Take 1 capsule by mouth every 12 (twelve) hours.   Accu-Chek FastClix Lancets MISC Use to monitor glucose 4 times daily   glucose blood (ACCU-CHEK GUIDE) test strip Use as instructed   Pancrelipase, Lip-Prot-Amyl, (ZENPEP) 40000-126000 units CPEP Take one every 4 hours   pantoprazole (PROTONIX) 40 MG tablet TAKE ONE TABLET BY MOUTH ONCE DAILY. (Patient not taking: Reported on 05/17/2023)   No facility-administered encounter medications on file as of 05/31/2023.    Allergies (verified) Buspar [buspirone] and Metoclopramide hcl   History: Past Medical History:  Diagnosis Date   Acute bacterial bronchitis    dx 12-06-2014  --  FINISHED Z-PAC-  NO FEVER BUT STILL INTERMITTANT  PRODUCTIVE COUGH   Anxiety    Chronic pain    has pain specialist   Diabetes mellitus type I (HCC)    GERD (gastroesophageal reflux disease)    History of chronic gastritis    History of chronic pancreatitis  multple recurrent--  s/p  whipple 2011   History of esophageal ulcer    History of kidney stones    History of subarachnoid hemorrhage    01-08-2013--  fell on concrete--  resolved without surgical intervention   History of suicide attempt    hx multiple attempts by overdosing of meds   IBS (irritable bowel syndrome)    Left ureteral calculus    Major depression, recurrent, chronic (HCC)    w/  Hx  multiple suicide attempts (overdose)   Migraine headache    Type 2 diabetes mellitus, uncontrolled    Whipple disease 2011   Past Surgical History:   Procedure Laterality Date   CESAREAN SECTION  x2  last one 02-29-2000   last one w/ BILATERAL TUBAL LIGATION   COLONOSCOPY  03-14-2009   dx ileocolonscopy   CYSTOSCOPY WITH RETROGRADE PYELOGRAM, URETEROSCOPY AND STENT PLACEMENT Left 12/20/2014   Procedure: CYSTOSCOPY, LEFT URETEROSCOPY, STONE REMOVAL, LASER LITHOTRIPSY;  Surgeon: Barron Alvine, MD;  Location: [facility] [place];  Service: Urology;  Laterality: Left;   ESOPHAGOGASTRODUODENOSCOPY  last  one 12-31-2008   ESOPHAGOGASTRODUODENOSCOPY (EGD) WITH PROPOFOL N/A 05/28/2017   Procedure: ESOPHAGOGASTRODUODENOSCOPY (EGD) WITH PROPOFOL;  Surgeon: Malissa Hippo, MD;  Location: [facility];  Service: Endoscopy;  Laterality: N/A;  2:00   HOLMIUM LASER APPLICATION Left 12/20/2014   Procedure: HOLMIUM LASER APPLICATION;  Surgeon: Barron Alvine, MD;  Location: [facility];  Service: Urology;  Laterality: Left;   LAPAROSCOPIC ASSISTED VAGINAL HYSTERECTOMY  12-16-2006   LAPAROSCOPIC NISSEN FUNDOPLICATION  Nov 2001   w/  CHOLECYSTECTOMY   PORTACATH PLACEMENT N/A 07/12/2017   Procedure: INSERTION PORT-A-CATH;  Surgeon: Franky Macho, MD;  Location: [facility];  Service: General;  Laterality: N/A;   REPAIR PERFORATED VISCUS  POST WHIPPLE  x5    last one 06-23-2012   TUBAL LIGATION     WHIPPLE PROCEDURE  2011   at [facility] History  Problem Relation Age of Onset   Hypertension Mother    Hyperlipidemia Mother    Diabetes Father    Stroke Father    Social History   Socioeconomic History   Marital status: Legally Separated    Spouse name: Not on file   Number of children: Not on file   Years of education: Not on file   Highest education level: Not on file  Occupational History   Not on file  Tobacco Use   Smoking status: Never   Smokeless tobacco: Never  Vaping Use   Vaping status: Never Used  Substance and Sexual Activity   Alcohol use: No   Drug use: Not Currently   Sexual activity: Yes    Birth  control/protection: Surgical  Other Topics Concern   Not on file  Social History Narrative   Not on file   Social Determinants of Health   Financial Resource Strain: Low Risk  (05/31/2023)   Overall Financial Resource Strain (CARDIA)    Difficulty of Paying Living Expenses: Not hard at all  Food Insecurity: No Food Insecurity (05/31/2023)   Hunger Vital Sign    Worried About Running Out of Food in the Last Year: Never true    Ran Out of Food in the Last Year: Never true  Transportation Needs: No Transportation Needs (05/31/2023)   PRAPARE - Administrator, Civil Service (Medical): No    Lack of Transportation (Non-Medical): No  Physical Activity: Inactive (05/31/2023)   Exercise Vital Sign    Days  of Exercise per Week: 0 days    Minutes of Exercise per Session: 0 min  Stress: No Stress Concern Present (05/31/2023)   [employer] of Occupational Health - Occupational Stress Questionnaire    Feeling of Stress : Not at all  Social Connections: Moderately Isolated (05/31/2023)   Social Connection and Isolation Panel [NHANES]    Frequency of Communication with Friends and Family: More than three times a week    Frequency of Social Gatherings with Friends and Family: More than three times a week    Attends Religious Services: Never    Database administrator or Organizations: No    Attends Engineer, structural: Never    Marital Status: Married    Tobacco Counseling Counseling given: Yes   Clinical Intake:  Pre-visit preparation completed: Yes  Pain : 0-10 Pain Score: 5  Pain Type: Chronic pain Pain Location: Abdomen Pain Orientation: Right, Upper Pain Descriptors / Indicators: Stabbing Pain Onset: Other (comment) Pain Frequency: Constant     BMI - recorded: 21.26 Nutritional Status: BMI of 19-24  Normal Nutritional Risks: None Diabetes: No (per patient she is not a diabetic.)  How often do you need to have someone help you when you read  instructions, pamphlets, or other written materials from your doctor or pharmacy?: 1 - Never  Interpreter Needed?: No  Information entered by ::  Avyan Livesay, CMA   Activities of Daily Living    05/31/2023   10:29 AM  In your present state of health, do you have any difficulty performing the following activities:  Hearing? 0  Vision? 0  Difficulty concentrating or making decisions? 0  Walking or climbing stairs? 0  Dressing or bathing? 0  Doing errands, shopping? 0  Preparing Food and eating ? N  Using the Toilet? N  In the past six months, have you accidently leaked urine? N  Do you have problems with loss of bowel control? N  Managing your Medications? N  Managing your Finances? N  Housekeeping or managing your Housekeeping? N    Patient Care Team: Tommie Sams, DO as PCP - General (Family Medicine) Merryl Hacker, NP as Nurse Practitioner (Nurse Practitioner)  Indicate any recent Medical Services you may have received from other than [facility] providers in the past year (date may be approximate).     Assessment:   This is a routine wellness examination for Jessalynn.  Hearing/Vision screen Hearing Screening - Comments:: Patient denies any hearing difficulties.   Vision Screening - Comments:: Referral placed today for patient to establish with an eye care provider   Dietary issues and exercise activities discussed:     Goals Addressed             This Visit's Progress    Patient Stated       Be more active       Depression Screen    05/31/2023   10:25 AM 01/02/2023   10:26 AM 10/27/2014    4:38 PM  PHQ 2/9 Scores  PHQ - 2 Score 0 0 0  PHQ- 9 Score 6 3     Fall Risk    05/31/2023   10:28 AM 10/27/2014    4:38 PM  Fall Risk   Falls in the past year? 0 No  Number falls in past yr: 0   Injury with Fall? 0   Risk for fall due to : No Fall Risks   Follow up Falls prevention discussed     MEDICARE RISK AT  HOME: Medicare Risk at Home Any stairs in  or around the home?: Yes If so, are there any without handrails?: No Home free of loose throw rugs in walkways, pet beds, electrical cords, etc?: Yes Adequate lighting in your home to reduce risk of falls?: Yes Life alert?: No Use of a cane, walker or w/c?: No Grab bars in the bathroom?: No Shower chair or bench in shower?: No Elevated toilet seat or a handicapped toilet?: No  TIMED UP AND GO:  Was the test performed?  No    Cognitive Function:        05/31/2023   10:23 AM  6CIT Screen  What Year? 0 points  What month? 0 points  What time? 0 points  Count back from 20 0 points  Months in reverse 0 points  Repeat phrase 0 points  Total Score 0 points    Immunizations Immunization History  Administered Date(s) Administered   Influenza Split 01/09/2013   Influenza,inj,Quad PF,6+ Mos 08/14/2013   Influenza-Unspecified 09/08/2011   Td 01/08/2013   Tdap 09/20/2011    TDAP status: Due, Education has been provided regarding the importance of this vaccine. Advised may receive this vaccine at local pharmacy or Health Dept. Aware to provide a copy of the vaccination record if obtained from local pharmacy or Health Dept. Verbalized acceptance and understanding.  Flu Vaccine status: Due, Education has been provided regarding the importance of this vaccine. Advised may receive this vaccine at local pharmacy or Health Dept. Aware to provide a copy of the vaccination record if obtained from local pharmacy or Health Dept. Verbalized acceptance and understanding.  Pneumococcal vaccine status: NOT AGE APPROPRIATE FOR THIS PATIENT  Covid-19 vaccine status: Information provided on how to obtain vaccines.   Qualifies for Shingles Vaccine? Yes   Zostavax completed No   Shingrix Completed?: No.    Education has been provided regarding the importance of this vaccine. Patient has been advised to call insurance company to determine out of pocket expense if they have not yet received this  vaccine. Advised may also receive vaccine at local pharmacy or Health Dept. Verbalized acceptance and understanding.  Screening Tests Health Maintenance  Topic Date Due   Medicare Annual Wellness (AWV)  Never done   COVID-19 Vaccine (1) Never done   FOOT EXAM  Never done   OPHTHALMOLOGY EXAM  Never done   Diabetic kidney evaluation - Urine ACR  Never done   Zoster Vaccines- Shingrix (1 of 2) Never done   Colonoscopy  Never done   MAMMOGRAM  Never done   HEMOGLOBIN A1C  02/12/2022   Diabetic kidney evaluation - eGFR measurement  05/11/2022   DTaP/Tdap/Td (3 - Td or Tdap) 01/09/2023   INFLUENZA VACCINE  05/09/2023   Hepatitis C Screening  Completed   HIV Screening  Completed   HPV VACCINES  Aged Out    Health Maintenance  Health Maintenance Due  Topic Date Due   Medicare Annual Wellness (AWV)  Never done   COVID-19 Vaccine (1) Never done   FOOT EXAM  Never done   OPHTHALMOLOGY EXAM  Never done   Diabetic kidney evaluation - Urine ACR  Never done   Zoster Vaccines- Shingrix (1 of 2) Never done   Colonoscopy  Never done   MAMMOGRAM  Never done   HEMOGLOBIN A1C  02/12/2022   Diabetic kidney evaluation - eGFR measurement  05/11/2022   DTaP/Tdap/Td (3 - Td or Tdap) 01/09/2023   INFLUENZA VACCINE  05/09/2023    Colorectal  Cancer Screening: patient is scheduled to have a colonoscopy on 06/13/2023  Mammogram status: Ordered 05/31/2023. Pt provided with contact info and advised to call to schedule appt.   Bone Density Screening: NOT AGE APPROPRIATE FOR THIS PATIENT  Lung Cancer Screening: (Low Dose CT Chest recommended if Age 30-80 years, 20 pack-year currently smoking OR have quit w/in 15years.) does not qualify.   Lung Cancer Screening Referral: na  Additional Screening:  Hepatitis C Screening: does not qualify; Completed 08/23/2016  Vision Screening: Recommended annual ophthalmology exams for early detection of glaucoma and other disorders of the eye. Is the patient up to  date with their annual eye exam?  No  Who is the provider or what is the name of the office in which the patient attends annual eye exams? na If pt is not established with a provider, would they like to be referred to a provider to establish care? Yes .   Dental Screening: Recommended annual dental exams for proper oral hygiene  Diabetic Foot Exam: Diabetic Foot Exam: Overdue, Pt has been advised about the importance in completing this exam. Pt is scheduled for diabetic foot exam on provider notified.  Community Resource Referral / Chronic Care Management: CRR required this visit?  No   CCM required this visit?  No     Plan:     I have personally reviewed and noted the following in the patient's chart:   Medical and social history Use of alcohol, tobacco or illicit drugs  Current medications and supplements including opioid prescriptions. Patient is currently taking opioid prescriptions. Information provided to patient regarding non-opioid alternatives. Patient advised to discuss non-opioid treatment plan with their provider. Functional ability and status Nutritional status Physical activity Advanced directives List of other physicians Hospitalizations, surgeries, and ER visits in previous 12 months Vitals Screenings to include cognitive, depression, and falls Referrals and appointments  In addition, I have reviewed and discussed with patient certain preventive protocols, quality metrics, and best practice recommendations. A written personalized care plan for preventive services as well as general preventive health recommendations were provided to patient.     Jordan Hawks Stefon Ramthun, CMA   05/31/2023   After Visit Summary: (MyChart) Due to this being a telephonic visit, the after visit summary with patients personalized plan was offered to patient via MyChart   Nurse Notes:

## 2023-06-06 ENCOUNTER — Encounter (INDEPENDENT_AMBULATORY_CARE_PROVIDER_SITE_OTHER): Payer: Self-pay

## 2023-06-06 NOTE — Patient Instructions (Signed)
   Your procedure is scheduled on: 06/13/2023  Report to [facility] Main Entrance at  6:00   AM.  Call this number if you have problems the morning of surgery: [phone]   Remember:              Follow Directions on the letter you received from Your Physician's office regarding the Bowel Prep              No Smoking the day of Procedure :   Take these medicines the morning of surgery with A SIP OF WATER: oxycodone, pantoprazole, and phenergan if needed   Do not wear jewelry, make-up or nail polish.    Do not bring valuables to the hospital.  Contacts, dentures or bridgework may not be worn into surgery.  .   Patients discharged the day of surgery will not be allowed to drive home.     Colonoscopy, Adult, Care After This sheet gives you information about how to care for yourself after your procedure. Your health care provider may also give you more specific instructions. If you have problems or questions, contact your health care provider. What can I expect after the procedure? After the procedure, it is common to have: A small amount of blood in your stool for 24 hours after the procedure. Some gas. Mild abdominal cramping or bloating.  Follow these instructions at home: General instructions  For the first 24 hours after the procedure: Do not drive or use machinery. Do not sign important documents. Do not drink alcohol. Do your regular daily activities at a slower pace than normal. Eat soft, easy-to-digest foods. Rest often. Take over-the-counter or prescription medicines only as told by your health care provider. It is up to you to get the results of your procedure. Ask your health care provider, or the department performing the procedure, when your results will be ready. Relieving cramping and bloating Try walking around when you have cramps or feel bloated. Apply heat to your abdomen as told by your health care provider. Use a heat source that your health care provider  recommends, such as a moist heat pack or a heating pad. Place a towel between your skin and the heat source. Leave the heat on for 20-30 minutes. Remove the heat if your skin turns bright red. This is especially important if you are unable to feel pain, heat, or cold. You may have a greater risk of getting burned. Eating and drinking Drink enough fluid to keep your urine clear or pale yellow. Resume your normal diet as instructed by your health care provider. Avoid heavy or fried foods that are hard to digest. Avoid drinking alcohol for as long as instructed by your health care provider. Contact a health care provider if: You have blood in your stool 2-3 days after the procedure. Get help right away if: You have more than a small spotting of blood in your stool. You pass large blood clots in your stool. Your abdomen is swollen. You have nausea or vomiting. You have a fever. You have increasing abdominal pain that is not relieved with medicine. This information is not intended to replace advice given to you by your health care provider. Make sure you discuss any questions you have with your health care provider. Document Released: 05/08/2004 Document Revised: 06/18/2016 Document Reviewed: 12/06/2015 Elsevier Interactive Patient Education  [employer].

## 2023-06-11 ENCOUNTER — Encounter (HOSPITAL_COMMUNITY): Payer: Self-pay

## 2023-06-11 ENCOUNTER — Encounter (HOSPITAL_COMMUNITY)
Admission: RE | Admit: 2023-06-11 | Discharge: 2023-06-11 | Disposition: A | Discharge status: Home / Self Care | Payer: Medicare HMO | Source: Ambulatory Visit | Attending: Gastroenterology | Admitting: Gastroenterology

## 2023-06-11 VITALS — BP 110/78 | HR 80 | Temp 98.5°F | Resp 16 | Ht 63.0 in | Wt 120.0 lb

## 2023-06-11 DIAGNOSIS — Z01812 Encounter for preprocedural laboratory examination: Secondary | ICD-10-CM | POA: Insufficient documentation

## 2023-06-11 DIAGNOSIS — K8689 Other specified diseases of pancreas: Secondary | ICD-10-CM | POA: Diagnosis not present

## 2023-06-11 DIAGNOSIS — E1069 Type 1 diabetes mellitus with other specified complication: Secondary | ICD-10-CM

## 2023-06-11 LAB — COMPREHENSIVE METABOLIC PANEL
ALT: 38 U/L (ref 0–44)
AST: 33 U/L (ref 15–41)
Albumin: 3.6 g/dL (ref 3.5–5.0)
Alkaline Phosphatase: 89 U/L (ref 38–126)
Anion gap: 10 (ref 5–15)
BUN: 13 mg/dL (ref 6–20)
CO2: 15 mmol/L — ABNORMAL LOW (ref 22–32)
Calcium: 8.5 mg/dL — ABNORMAL LOW (ref 8.9–10.3)
Chloride: 111 mmol/L (ref 98–111)
Creatinine, Ser: 0.64 mg/dL (ref 0.44–1.00)
GFR, Estimated: >60 mL/min (ref >60)
Glucose, Bld: 267 mg/dL — ABNORMAL HIGH (ref 70–99)
Potassium: 4.1 mmol/L (ref 3.5–5.1)
Sodium: 136 mmol/L (ref 135–145)
Total Bilirubin: 0.8 mg/dL (ref 0.3–1.2)
Total Protein: 6.4 g/dL — ABNORMAL LOW (ref 6.5–8.1)

## 2023-06-13 ENCOUNTER — Ambulatory Visit (HOSPITAL_BASED_OUTPATIENT_CLINIC_OR_DEPARTMENT_OTHER): Payer: Medicare HMO | Admitting: Anesthesiology

## 2023-06-13 ENCOUNTER — Encounter (HOSPITAL_COMMUNITY): Payer: Self-pay

## 2023-06-13 ENCOUNTER — Ambulatory Visit (HOSPITAL_COMMUNITY)
Admission: RE | Admit: 2023-06-13 | Discharge: 2023-06-13 | Disposition: A | Discharge status: Home / Self Care | Payer: Medicare HMO | Attending: Gastroenterology | Admitting: Gastroenterology

## 2023-06-13 ENCOUNTER — Encounter (HOSPITAL_COMMUNITY)
Admission: RE | Disposition: A | Discharge status: Home / Self Care | Payer: Self-pay | Source: Home / Self Care | Attending: Gastroenterology

## 2023-06-13 ENCOUNTER — Telehealth (INDEPENDENT_AMBULATORY_CARE_PROVIDER_SITE_OTHER): Payer: Self-pay | Admitting: Gastroenterology

## 2023-06-13 ENCOUNTER — Ambulatory Visit (HOSPITAL_COMMUNITY): Payer: Medicare HMO | Admitting: Anesthesiology

## 2023-06-13 DIAGNOSIS — F418 Other specified anxiety disorders: Secondary | ICD-10-CM

## 2023-06-13 DIAGNOSIS — Z1211 Encounter for screening for malignant neoplasm of colon: Secondary | ICD-10-CM | POA: Diagnosis not present

## 2023-06-13 DIAGNOSIS — E119 Type 2 diabetes mellitus without complications: Secondary | ICD-10-CM | POA: Diagnosis not present

## 2023-06-13 DIAGNOSIS — Z794 Long term (current) use of insulin: Secondary | ICD-10-CM | POA: Insufficient documentation

## 2023-06-13 DIAGNOSIS — K219 Gastro-esophageal reflux disease without esophagitis: Secondary | ICD-10-CM | POA: Insufficient documentation

## 2023-06-13 DIAGNOSIS — E1065 Type 1 diabetes mellitus with hyperglycemia: Secondary | ICD-10-CM | POA: Insufficient documentation

## 2023-06-13 DIAGNOSIS — K861 Other chronic pancreatitis: Secondary | ICD-10-CM | POA: Insufficient documentation

## 2023-06-13 DIAGNOSIS — K3184 Gastroparesis: Secondary | ICD-10-CM | POA: Diagnosis not present

## 2023-06-13 DIAGNOSIS — G8929 Other chronic pain: Secondary | ICD-10-CM | POA: Diagnosis not present

## 2023-06-13 DIAGNOSIS — E1043 Type 1 diabetes mellitus with diabetic autonomic (poly)neuropathy: Secondary | ICD-10-CM | POA: Insufficient documentation

## 2023-06-13 DIAGNOSIS — K589 Irritable bowel syndrome without diarrhea: Secondary | ICD-10-CM | POA: Insufficient documentation

## 2023-06-13 DIAGNOSIS — K439 Ventral hernia without obstruction or gangrene: Secondary | ICD-10-CM | POA: Insufficient documentation

## 2023-06-13 HISTORY — PX: FLEXIBLE SIGMOIDOSCOPY: SHX5431

## 2023-06-13 LAB — GLUCOSE, CAPILLARY
Glucose-Capillary: 122 mg/dL — ABNORMAL HIGH (ref 70–99)
Glucose-Capillary: 69 mg/dL — ABNORMAL LOW (ref 70–99)

## 2023-06-13 SURGERY — SIGMOIDOSCOPY, FLEXIBLE
Anesthesia: General

## 2023-06-13 MED ORDER — LACTATED RINGERS IV SOLN: INTRAVENOUS | Status: DC

## 2023-06-13 MED ORDER — PHENYLEPHRINE 80 MCG/ML (10ML) SYRINGE FOR IV PUSH (FOR BLOOD PRESSURE SUPPORT)
PREFILLED_SYRINGE | INTRAVENOUS | Status: DC | PRN
Start: 2023-06-13 — End: 2023-06-13
  Administered 2023-06-13: 160 ug via INTRAVENOUS

## 2023-06-13 MED ORDER — LIDOCAINE HCL (CARDIAC) PF 100 MG/5ML IV SOSY
PREFILLED_SYRINGE | INTRAVENOUS | Status: DC | PRN
  Administered 2023-06-13: 50 mg via INTRAVENOUS

## 2023-06-13 MED ORDER — HEPARIN SOD (PORK) LOCK FLUSH 100 UNIT/ML IV SOLN
500.0000 [IU] | INTRAVENOUS | Status: AC | PRN
  Administered 2023-06-13: 500 [IU]

## 2023-06-13 MED ORDER — PROPOFOL 10 MG/ML IV BOLUS
INTRAVENOUS | Status: DC | PRN
  Administered 2023-06-13: 100 mg via INTRAVENOUS

## 2023-06-13 MED ORDER — DEXTROSE 50 % IV SOLN
INTRAVENOUS | Status: AC
  Administered 2023-06-13: 25 mL via INTRAVENOUS
  Filled 2023-06-13: qty 50

## 2023-06-13 MED ORDER — PROPOFOL 500 MG/50ML IV EMUL
INTRAVENOUS | Status: DC | PRN
  Administered 2023-06-13: 200 ug/kg/min via INTRAVENOUS

## 2023-06-13 MED ORDER — PHENYLEPHRINE 80 MCG/ML (10ML) SYRINGE FOR IV PUSH (FOR BLOOD PRESSURE SUPPORT)
PREFILLED_SYRINGE | INTRAVENOUS | Status: AC
  Filled 2023-06-13: qty 10

## 2023-06-13 MED ORDER — DEXTROSE 50 % IV SOLN: 25.0000 mL | Freq: Once | INTRAVENOUS | Status: AC

## 2023-06-13 MED ORDER — HEPARIN SOD (PORK) LOCK FLUSH 100 UNIT/ML IV SOLN
INTRAVENOUS | Status: AC
  Filled 2023-06-13: qty 5

## 2023-06-13 NOTE — Anesthesia Preprocedure Evaluation (Signed)
Anesthesia Evaluation  Patient identified by MRN, date of birth, ID band Patient awake    Reviewed: Allergy & Precautions, H&P , NPO status , Patient's Chart, lab work & pertinent test results  Airway Mallampati: II  TM Distance: >3 FB Neck ROM: Full    Dental  (+) Dental Advisory Given, Missing   Pulmonary neg pulmonary ROS   Pulmonary exam normal breath sounds clear to auscultation       Cardiovascular negative cardio ROS Normal cardiovascular exam Rhythm:Regular Rate:Normal     Neuro/Psych  Headaches PSYCHIATRIC DISORDERS Anxiety Depression Bipolar Disorder      GI/Hepatic Neg liver ROS,GERD  Medicated,,  Endo/Other  diabetes (insulin dependent after whipple's surgery), Well Controlled, Type 2, Insulin Dependent    Renal/GU negative Renal ROS  negative genitourinary   Musculoskeletal negative musculoskeletal ROS (+)    Abdominal   Peds negative pediatric ROS (+)  Hematology negative hematology ROS (+)   Anesthesia Other Findings   Reproductive/Obstetrics negative OB ROS                             Anesthesia Physical Anesthesia Plan  ASA: 3  Anesthesia Plan: General   Post-op Pain Management: Minimal or no pain anticipated   Induction: Intravenous  PONV Risk Score and Plan: 1 and Propofol infusion  Airway Management Planned: Nasal Cannula and Natural Airway  Additional Equipment:   Intra-op Plan:   Post-operative Plan:   Informed Consent: I have reviewed the patients History and Physical, chart, labs and discussed the procedure including the risks, benefits and alternatives for the proposed anesthesia with the patient or authorized representative who has indicated his/her understanding and acceptance.     Dental advisory given  Plan Discussed with: CRNA and Surgeon  Anesthesia Plan Comments:        Anesthesia Quick Evaluation

## 2023-06-13 NOTE — Discharge Instructions (Signed)
  Discharge instructions Please read the instructions outlined below and refer to this sheet in the next few weeks. These discharge instructions provide you with general information on caring for yourself after you leave the hospital. Your doctor may also give you specific instructions. While your treatment has been planned according to the most current medical practices available, unavoidable complications occasionally occur. If you have any problems or questions after discharge, please call your doctor. ACTIVITY You may resume your regular activity but move at a slower pace for the next 24 hours.  Take frequent rest periods for the next 24 hours.  Walking will help expel (get rid of) the air and reduce the bloated feeling in your abdomen.  No driving for 24 hours (because of the anesthesia (medicine) used during the test).  You may shower.  Do not sign any important legal documents or operate any machinery for 24 hours (because of the anesthesia used during the test).  NUTRITION Drink plenty of fluids.  You may resume your normal diet.  Begin with a light meal and progress to your normal diet.  Avoid alcoholic beverages for 24 hours or as instructed by your caregiver.  MEDICATIONS You may resume your normal medications unless your caregiver tells you otherwise.  WHAT YOU CAN EXPECT TODAY You may experience abdominal discomfort such as a feeling of fullness or "gas" pains.  FOLLOW-UP Your doctor will discuss the results of your test with you.  SEEK IMMEDIATE MEDICAL ATTENTION IF ANY OF THE FOLLOWING OCCUR: Excessive nausea (feeling sick to your stomach) and/or vomiting.  Severe abdominal pain and distention (swelling).  Trouble swallowing.  Temperature over 101 F (37.8 C).  Rectal bleeding or vomiting of blood.    Recommend to be rescheduled for colonoscopy next available appointment with extended bowel prep   I hope you have a great rest of your week!   Vista Lawman ,  M.D.. Gastroenterology and Hepatology [facility]

## 2023-06-13 NOTE — Interval H&P Note (Signed)
History and Physical Interval Note:  06/13/2023 7:33 AM  [name] [name]  has presented today for surgery, with the diagnosis of screening.  The various methods of treatment have been discussed with the patient and family. After consideration of risks, benefits and other options for treatment, the patient has consented to  Procedure(s) with comments: COLONOSCOPY WITH PROPOFOL (N/A) - 7;30am;asa 3 as a surgical intervention.  The patient's history has been reviewed, patient examined, no change in status, stable for surgery.  I have reviewed the patient's chart and labs.  Questions were answered to the patient's satisfaction.     Juanetta Beets Shyla Gayheart

## 2023-06-13 NOTE — Anesthesia Postprocedure Evaluation (Signed)
Anesthesia Post Note  Patient: [name] [name]  Procedure(s) Performed: FLEXIBLE SIGMOIDOSCOPY  Patient location during evaluation: Phase II Anesthesia Type: General Level of consciousness: awake and alert and oriented Pain management: pain level controlled Vital Signs Assessment: post-procedure vital signs reviewed and stable Respiratory status: spontaneous breathing, nonlabored ventilation and respiratory function stable Cardiovascular status: blood pressure returned to baseline and stable Postop Assessment: no apparent nausea or vomiting Anesthetic complications: no  No notable events documented.   Last Vitals:  Vitals:   06/13/23 0657 06/13/23 0745  BP:  115/66  Pulse:  98  Resp:  14  Temp: (!) 36.4 C (!) 36.4 C  SpO2:  99%    Last Pain:  Vitals:   06/13/23 0745  TempSrc: Oral  PainSc: Asleep                 Raelynn Corron C Brinly Maietta

## 2023-06-13 NOTE — Telephone Encounter (Signed)
Franky Macho, MD  Marlowe Shores, LPN Hi Kenney Houseman  Can this patient be scheduled in a 1-2 months directly for colonoscopy with EXTENDED BOWEL prep , as we had to abort colonoscopy today due to poor prep  Room 3  Thanks

## 2023-06-13 NOTE — Anesthesia Procedure Notes (Signed)
Date/Time: 06/13/2023 7:35 AM  Performed by: Julian Reil, CRNAPre-anesthesia Checklist: Patient identified, Emergency Drugs available, Suction available and Patient being monitored Patient Re-evaluated:Patient Re-evaluated prior to induction Oxygen Delivery Method: Nasal cannula Induction Type: IV induction Placement Confirmation: positive ETCO2

## 2023-06-13 NOTE — Op Note (Signed)
[facility] Patient Name: [name] [name] Procedure Date: 06/13/2023 7:11 AM MRN: [id number] Date of Birth: May 31, 1971 Attending MD: Sanjuan Dame , MD, [id number] CSN: [id number] Age: 52 Admit Type: Outpatient Procedure:                Colonoscopy Indications:              Screening for colorectal malignant neoplasm Providers:                Sanjuan Dame, MD, Sheran Fava, Zena Amos Referring MD:              Medicines:                Monitored Anesthesia Care Complications:            No immediate complications. Estimated Blood Loss:     Estimated blood loss: none. Procedure:                Pre-Anesthesia Assessment:                           - Prior to the procedure, a History and Physical                            was performed, and patient medications and                            allergies were reviewed. The patient's tolerance of                            previous anesthesia was also reviewed. The risks                            and benefits of the procedure and the sedation                            options and risks were discussed with the patient.                            All questions were answered, and informed consent                            was obtained. Prior Anticoagulants: The patient has                            taken no anticoagulant or antiplatelet agents. ASA                            Grade Assessment: III - A patient with severe                            systemic disease. After reviewing the risks and  benefits, the patient was deemed in satisfactory                            condition to undergo the procedure.                           After obtaining informed consent, the colonoscope                            was passed under direct vision. Throughout the                            procedure, the patient's blood pressure, pulse, and                            oxygen  saturations were monitored continuously. The                            [id number]-[id number] ([id number]) scope was introduced through                            the anus with the intention of advancing to the                            cecum. The scope was advanced to the sigmoid colon                            before the procedure was aborted. Medications were                            given. The colonoscopy was performed without                            difficulty. The colonoscopy was aborted due to poor                            bowel prep with stool present. Lavage did not allow                            for the successful completion of the procedure. The                            colonoscopy was performed without difficulty. The                            patient tolerated the procedure well. After                            obtaining informed consent, the colonoscope was                            passed under direct vision. Throughout the  procedure, the patient's blood pressure, pulse, and                            oxygen saturations were monitored continuously. Scope In: 7:38:18 AM Scope Out: 7:41:31 AM Total Procedure Duration: 0 hours 3 minutes 13 seconds  Findings:      The perianal and digital rectal examinations were normal.      A large amount of stool was found in the rectum, in the recto-sigmoid       colon and in the sigmoid colon, precluding visualization. Lavage of the       area was performed using a large amount of sterile water, resulting in       incomplete clearance with continued poor visualization. Impression:               - The procedure was aborted due to poor bowel prep                            with stool present.                           - Stool in the rectum, in the recto-sigmoid colon                            and in the sigmoid colon.                           - No specimens collected. Moderate Sedation:      Per Anesthesia  Care Recommendation:           - Patient has a contact number available for                            emergencies. The signs and symptoms of potential                            delayed complications were discussed with the                            patient. Return to normal activities tomorrow.                            Written discharge instructions were provided to the                            patient.                           - Resume previous diet.                           - Continue present medications.                           - Repeat colonoscopy at next available appointment                            (  within 3 months) because the bowel preparation was                            poor.                           - Return to primary care physician as previously                            scheduled. Procedure Code(s):        --- Professional ---                           [phone], 53, Colonoscopy, flexible; diagnostic,                            including collection of specimen(s) by brushing or                            washing, when performed (separate procedure) Diagnosis Code(s):        --- Professional ---                           Z12.11, Encounter for screening for malignant                            neoplasm of colon CPT copyright 2022 American Medical Association. All rights reserved. The codes documented in this report are preliminary and upon coder review may  be revised to meet current compliance requirements. Sanjuan Dame, MD Sanjuan Dame, MD 06/13/2023 7:55:05 AM This report has been signed electronically. Number of Addenda: 0

## 2023-06-13 NOTE — Transfer of Care (Signed)
Immediate Anesthesia Transfer of Care Note  Patient: [name] [name]  Procedure(s) Performed: FLEXIBLE SIGMOIDOSCOPY  Patient Location: Short Stay  Anesthesia Type:General  Level of Consciousness: awake  Airway & Oxygen Therapy: Patient Spontanous Breathing  Post-op Assessment: Report given to RN and Post -op Vital signs reviewed and stable  Post vital signs: Reviewed and stable  Last Vitals:  Vitals Value Taken Time  BP    Temp    Pulse    Resp    SpO2      Last Pain:  Vitals:   06/13/23 0736  TempSrc:   PainSc: 5          Complications: No notable events documented.

## 2023-06-14 DIAGNOSIS — R109 Unspecified abdominal pain: Secondary | ICD-10-CM | POA: Diagnosis not present

## 2023-06-14 DIAGNOSIS — G63 Polyneuropathy in diseases classified elsewhere: Secondary | ICD-10-CM | POA: Diagnosis not present

## 2023-06-14 DIAGNOSIS — Z79899 Other long term (current) drug therapy: Secondary | ICD-10-CM | POA: Diagnosis not present

## 2023-06-14 DIAGNOSIS — K861 Other chronic pancreatitis: Secondary | ICD-10-CM | POA: Diagnosis not present

## 2023-06-14 DIAGNOSIS — R1013 Epigastric pain: Secondary | ICD-10-CM | POA: Diagnosis not present

## 2023-06-14 DIAGNOSIS — Z6821 Body mass index (BMI) 21.0-21.9, adult: Secondary | ICD-10-CM | POA: Diagnosis not present

## 2023-06-14 DIAGNOSIS — Z6820 Body mass index (BMI) 20.0-20.9, adult: Secondary | ICD-10-CM | POA: Diagnosis not present

## 2023-06-14 DIAGNOSIS — G8929 Other chronic pain: Secondary | ICD-10-CM | POA: Diagnosis not present

## 2023-06-14 DIAGNOSIS — F1120 Opioid dependence, uncomplicated: Secondary | ICD-10-CM | POA: Diagnosis not present

## 2023-06-17 NOTE — Telephone Encounter (Signed)
Note placed in reminder file

## 2023-06-21 ENCOUNTER — Encounter (HOSPITAL_COMMUNITY): Payer: Self-pay | Admitting: Gastroenterology

## 2023-07-08 ENCOUNTER — Ambulatory Visit (INDEPENDENT_AMBULATORY_CARE_PROVIDER_SITE_OTHER): Payer: Medicare HMO | Admitting: Family Medicine

## 2023-07-08 VITALS — BP 124/86 | HR 89 | Temp 97.0°F | Ht 63.0 in | Wt 121.0 lb

## 2023-07-08 DIAGNOSIS — G4709 Other insomnia: Secondary | ICD-10-CM | POA: Diagnosis not present

## 2023-07-08 DIAGNOSIS — E1069 Type 1 diabetes mellitus with other specified complication: Secondary | ICD-10-CM | POA: Diagnosis not present

## 2023-07-08 DIAGNOSIS — Z23 Encounter for immunization: Secondary | ICD-10-CM | POA: Diagnosis not present

## 2023-07-08 DIAGNOSIS — G8929 Other chronic pain: Secondary | ICD-10-CM

## 2023-07-08 DIAGNOSIS — M25531 Pain in right wrist: Secondary | ICD-10-CM

## 2023-07-08 MED ORDER — ZOLPIDEM TARTRATE 5 MG PO TABS: 5.0000 mg | ORAL_TABLET | Freq: Every evening | ORAL | 1 refills | Status: DC | PRN

## 2023-07-08 NOTE — Assessment & Plan Note (Signed)
Referring to orthopedics.  Referral placed.

## 2023-07-08 NOTE — Assessment & Plan Note (Signed)
Uncontrolled.  We have arranged for her to have labs drawn with her port flush.

## 2023-07-08 NOTE — Patient Instructions (Signed)
Medication as prescribed.  Follow up in 6 months.  Take care  Dr. Lacinda Axon

## 2023-07-08 NOTE — Progress Notes (Signed)
Subjective:  Patient ID: [name] [name], female    DOB: July 19, 1971  Age: 52 y.o. MRN: [id number]  CC: Chief Complaint  Patient presents with   6 month follow up    Patient is seen by [facility] for endocrinology and [facility] for pain     HPI:  52 year old female with pancreatic insufficiency, diabetic gastroparesis, type 1 diabetes with complications, insomnia presents for follow-up.  Patient needs labs.  She did not previously get these drawn.  Patient has a Port-A-Cath in place and would like blood to be drawn through her Port-A-Cath given that she is a hard stick.  Needs labs including A1c.  Suspect that she is not well-controlled.  She has an upcoming appointment with endocrinology through [facility].  Patient has chronic pain.  Follows with pain management at [facility].  Patient reports ongoing insomnia.  She states that she has trouble falling asleep and staying asleep.  This is chronic.  She states that she has tried [place] and trazodone in the past.  She has also tried Ambien.  She states that the only thing that helped her was Ambien.  She would like to restart this today.  Patient reports chronic right wrist pain.  She states that it has been going on for the past 3 months.  No fall, trauma, injury.  Worse with extension of the wrist.  Patient Active Problem List   Diagnosis Date Noted   Chronic pain of right wrist 07/08/2023   Diabetic gastroparesis (HCC) 05/17/2023   Port-A-Cath in place 01/02/2023   Pancreatic insufficiency 08/15/2021   Insomnia 06/28/2014   Type 1 diabetes mellitus with other specified complication (HCC) 01/22/2013   Drug-induced constipation 05/13/2012   BIPOLAR DISORDER UNSPECIFIED 02/03/2009   Esophageal reflux 02/03/2009    Social Hx   Social History   Socioeconomic History   Marital status: Legally Separated    Spouse name: Not on file   Number of children: Not on file   Years of education: Not on file    Highest education level: Some college, no degree  Occupational History   Not on file  Tobacco Use   Smoking status: Never   Smokeless tobacco: Never  Vaping Use   Vaping status: Never Used  Substance and Sexual Activity   Alcohol use: No   Drug use: Not Currently   Sexual activity: Yes    Birth control/protection: Surgical  Other Topics Concern   Not on file  Social History Narrative   Not on file   Social Determinants of Health   Financial Resource Strain: Medium Risk (07/08/2023)   Overall Financial Resource Strain (CARDIA)    Difficulty of Paying Living Expenses: Somewhat hard  Food Insecurity: No Food Insecurity (07/08/2023)   Hunger Vital Sign    Worried About Running Out of Food in the Last Year: Never true    Ran Out of Food in the Last Year: Never true  Transportation Needs: No Transportation Needs (07/08/2023)   PRAPARE - Administrator, Civil Service (Medical): No    Lack of Transportation (Non-Medical): No  Physical Activity: Insufficiently Active (07/08/2023)   Exercise Vital Sign    Days of Exercise per Week: 3 days    Minutes of Exercise per Session: 30 min  Stress: No Stress Concern Present (07/08/2023)   [employer] of Occupational Health - Occupational Stress Questionnaire    Feeling of Stress : Only a little  Social Connections: Moderately Integrated (07/08/2023)   Social  Connection and Isolation Panel [NHANES]    Frequency of Communication with Friends and Family: More than three times a week    Frequency of Social Gatherings with Friends and Family: Once a week    Attends Religious Services: More than 4 times per year    Active Member of [employer] or Organizations: No    Attends Banker Meetings: Never    Marital Status: Married  Recent Concern: Social Connections - Moderately Isolated (05/31/2023)   Social Connection and Isolation Panel [NHANES]    Frequency of Communication with Friends and Family: More than three times a week     Frequency of Social Gatherings with Friends and Family: More than three times a week    Attends Religious Services: Never    Database administrator or Organizations: No    Attends Engineer, structural: Never    Marital Status: Married    Review of Systems Per HPI  Objective:  BP 124/86   Pulse 89   Temp (!) 97 F (36.1 C)   Ht 5\' 3"  (1.6 m)   Wt 121 lb (54.9 kg)   SpO2 99%   BMI 21.43 kg/m      07/08/2023   10:17 AM 06/13/2023    7:45 AM 06/13/2023    6:43 AM  BP/Weight  Systolic BP 124 115 121  Diastolic BP 86 66 97  Wt. (Lbs) 121  119.93  BMI 21.43 kg/m2  21.24 kg/m2    Physical Exam Vitals and nursing note reviewed.  Constitutional:      General: She is not in acute distress.    Appearance: Normal appearance.  HENT:     Head: Normocephalic and atraumatic.  Cardiovascular:     Rate and Rhythm: Normal rate and regular rhythm.  Pulmonary:     Effort: Pulmonary effort is normal.     Breath sounds: Normal breath sounds. No wheezing or rales.  Musculoskeletal:     Comments: Right wrist is diffusely tender to palpation.  No swelling noted on exam.  Full range of motion.  Neurological:     Mental Status: She is alert.     Lab Results  Component Value Date   WBC 5.4 10/30/2019   HGB 13.1 10/30/2019   HCT 39.4 10/30/2019   PLT 209 10/30/2019   GLUCOSE 267 (H) 06/11/2023   CHOL  07/28/2009    141        ATP III CLASSIFICATION:  <200     mg/dL   Desirable  409-811  mg/dL   Borderline High  >=914    mg/dL   High          TRIG 782 07/28/2009   ALT 38 06/11/2023   AST 33 06/11/2023   NA 136 06/11/2023   K 4.1 06/11/2023   CL 111 06/11/2023   CREATININE 0.64 06/11/2023   BUN 13 06/11/2023   CO2 15 (L) 06/11/2023   INR 1.14 01/08/2013   HGBA1C 8.9 (A) 08/15/2021     Assessment & Plan:   Problem List Items Addressed This Visit       Endocrine   Type 1 diabetes mellitus with other specified complication (HCC) - Primary    Uncontrolled.   We have arranged for her to have labs drawn with her port flush.        Other   Insomnia    Chronic.  Worsening/uncontrolled.  Starting on Ambien.      Chronic pain of right wrist  Referring to orthopedics.  Referral placed.      Relevant Orders   Ambulatory referral to Orthopedic Surgery   Other Visit Diagnoses     Immunization due       Relevant Orders   Flu vaccine trivalent PF, 6mos and older(Flulaval,Afluria,Fluarix,Fluzone) (Completed)       Meds ordered this encounter  Medications   zolpidem (AMBIEN) 5 MG tablet    Sig: Take 1 tablet (5 mg total) by mouth at bedtime as needed for sleep.    Dispense:  30 tablet    Refill:  1    Follow-up:  Return in about 6 months (around 01/05/2024).  Everlene Other DO [facility]

## 2023-07-08 NOTE — Assessment & Plan Note (Signed)
Chronic.  Worsening/uncontrolled.  Starting on Ambien.

## 2023-07-15 DIAGNOSIS — Z8719 Personal history of other diseases of the digestive system: Secondary | ICD-10-CM | POA: Diagnosis not present

## 2023-07-15 DIAGNOSIS — K861 Other chronic pancreatitis: Secondary | ICD-10-CM | POA: Diagnosis not present

## 2023-07-15 DIAGNOSIS — Z79899 Other long term (current) drug therapy: Secondary | ICD-10-CM | POA: Diagnosis not present

## 2023-07-15 DIAGNOSIS — Z6820 Body mass index (BMI) 20.0-20.9, adult: Secondary | ICD-10-CM | POA: Diagnosis not present

## 2023-07-15 DIAGNOSIS — R109 Unspecified abdominal pain: Secondary | ICD-10-CM | POA: Diagnosis not present

## 2023-07-15 DIAGNOSIS — G8929 Other chronic pain: Secondary | ICD-10-CM | POA: Diagnosis not present

## 2023-07-19 DIAGNOSIS — Z79899 Other long term (current) drug therapy: Secondary | ICD-10-CM | POA: Diagnosis not present

## 2023-07-29 ENCOUNTER — Encounter (INDEPENDENT_AMBULATORY_CARE_PROVIDER_SITE_OTHER): Payer: Self-pay | Admitting: Gastroenterology

## 2023-07-29 ENCOUNTER — Other Ambulatory Visit (INDEPENDENT_AMBULATORY_CARE_PROVIDER_SITE_OTHER): Payer: Self-pay | Admitting: *Deleted

## 2023-07-29 MED ORDER — ZENPEP 40000-126000 UNITS PO CPEP: ORAL_CAPSULE | ORAL | 2 refills | Status: DC

## 2023-07-29 NOTE — Telephone Encounter (Signed)
Tanya please schedule tcs in next 1 -2 months per Dr. Tasia Catchings. Pt is aware  Zenpep rx sent in as prescribed previously by Dr. Tasia Catchings. To [facility] on [address] due to pt stating [place] [facility] did not have med.  ncrelipase, Lip-Prot-Amyl, (ZENPEP) 40000-126000 units CPEP Take one every 4 hours Dispense: 180 capsule, Refills: 2 ordered

## 2023-08-14 DIAGNOSIS — G63 Polyneuropathy in diseases classified elsewhere: Secondary | ICD-10-CM | POA: Diagnosis not present

## 2023-08-14 DIAGNOSIS — R1013 Epigastric pain: Secondary | ICD-10-CM | POA: Diagnosis not present

## 2023-08-14 DIAGNOSIS — F1120 Opioid dependence, uncomplicated: Secondary | ICD-10-CM | POA: Diagnosis not present

## 2023-08-14 DIAGNOSIS — R109 Unspecified abdominal pain: Secondary | ICD-10-CM | POA: Diagnosis not present

## 2023-08-14 DIAGNOSIS — Z6820 Body mass index (BMI) 20.0-20.9, adult: Secondary | ICD-10-CM | POA: Diagnosis not present

## 2023-08-14 DIAGNOSIS — K861 Other chronic pancreatitis: Secondary | ICD-10-CM | POA: Diagnosis not present

## 2023-08-14 DIAGNOSIS — G8929 Other chronic pain: Secondary | ICD-10-CM | POA: Diagnosis not present

## 2023-08-14 DIAGNOSIS — Z79899 Other long term (current) drug therapy: Secondary | ICD-10-CM | POA: Diagnosis not present

## 2023-08-28 ENCOUNTER — Telehealth (INDEPENDENT_AMBULATORY_CARE_PROVIDER_SITE_OTHER): Payer: Self-pay | Admitting: Gastroenterology

## 2023-08-28 NOTE — Telephone Encounter (Signed)
Due: 1 month ago Received: 2 months ago Marlowe Shores, LPN  Marlowe Shores, LPN Ahmed, Juanetta Beets, MD  Marlowe Shores, LPN Hi [name] [name]  Can this patient be scheduled in a 1-2 months directly for colonoscopy with EXTENDED BOWEL prep , as we had to abort colonoscopy today due to poor prep  Room 3  Thanks  Pt contacted but no available times that work for pt. Will call pt with January schedule.

## 2023-08-29 ENCOUNTER — Other Ambulatory Visit: Payer: Self-pay | Admitting: Family Medicine

## 2023-09-02 ENCOUNTER — Telehealth: Payer: Medicare HMO | Admitting: Physician Assistant

## 2023-09-02 DIAGNOSIS — R6889 Other general symptoms and signs: Secondary | ICD-10-CM

## 2023-09-02 MED ORDER — FLUTICASONE PROPIONATE 50 MCG/ACT NA SUSP
2.0000 | Freq: Every day | NASAL | 0 refills | Status: DC
Start: 2023-09-02 — End: 2023-12-16

## 2023-09-02 MED ORDER — NAPROXEN 500 MG PO TABS
500.0000 mg | ORAL_TABLET | Freq: Two times a day (BID) | ORAL | 0 refills | Status: DC
Start: 2023-09-02 — End: 2023-12-16

## 2023-09-02 MED ORDER — OSELTAMIVIR PHOSPHATE 75 MG PO CAPS
75.0000 mg | ORAL_CAPSULE | Freq: Two times a day (BID) | ORAL | 0 refills | Status: DC
Start: 2023-09-02 — End: 2023-12-16

## 2023-09-02 MED ORDER — PROMETHAZINE-DM 6.25-15 MG/5ML PO SYRP
5.0000 mL | ORAL_SOLUTION | Freq: Four times a day (QID) | ORAL | 0 refills | Status: DC | PRN
Start: 2023-09-02 — End: 2023-12-16

## 2023-09-02 NOTE — Progress Notes (Signed)
Virtual Visit Consent   [name] [name], you are scheduled for a virtual visit with a [facility] provider today. Just as with appointments in the office, your consent must be obtained to participate. Your consent will be active for this visit and any virtual visit you may have with one of our providers in the next 365 days. If you have a MyChart account, a copy of this consent can be sent to you electronically.  As this is a virtual visit, video technology does not allow for your provider to perform a traditional examination. This may limit your provider's ability to fully assess your condition. If your provider identifies any concerns that need to be evaluated in person or the need to arrange testing (such as labs, EKG, etc.), we will make arrangements to do so. Although advances in technology are sophisticated, we cannot ensure that it will always work on either your end or our end. If the connection with a video visit is poor, the visit may have to be switched to a telephone visit. With either a video or telephone visit, we are not always able to ensure that we have a secure connection.  By engaging in this virtual visit, you consent to the provision of healthcare and authorize for your insurance to be billed (if applicable) for the services provided during this visit. Depending on your insurance coverage, you may receive a charge related to this service.  I need to obtain your verbal consent now. Are you willing to proceed with your visit today? [name] [name] has provided verbal consent on 09/02/2023 for a virtual visit (video or telephone). Margaretann Loveless, PA-C  Date: 09/02/2023 1:28 PM  Virtual Visit via Video Note   I, Margaretann Loveless, connected with  [name] [name]  ([id number], 03-20-1971) on 09/02/23 at  1:30 PM EST by a video-enabled telemedicine application and verified that I am speaking with the correct person using two identifiers.  Location: Patient: Virtual Visit Location  Patient: Home Provider: Virtual Visit Location Provider: Home Office   I discussed the limitations of evaluation and management by telemedicine and the availability of in person appointments. The patient expressed understanding and agreed to proceed.    History of Present Illness: [name] [name] is a 52 y.o. who identifies as a female who was assigned female at birth, and is being seen today for URI symptoms.  HPI: URI  This is a new problem. The current episode started yesterday. The problem has been gradually worsening. There has been no fever. Associated symptoms include chest pain (tightness), congestion, ear pain, headaches, nausea, a plugged ear sensation, rhinorrhea (and post nasal drainage), sinus pain, a sore throat and swollen glands. Pertinent negatives include no coughing, diarrhea or vomiting. Associated symptoms comments: myalgias. Treatments tried: nyquil, dayquil. The treatment provided no relief.     Problems:  Patient Active Problem List   Diagnosis Date Noted   Chronic pain of right wrist 07/08/2023   Diabetic gastroparesis (HCC) 05/17/2023   Port-A-Cath in place 01/02/2023   Pancreatic insufficiency 08/15/2021   Insomnia 06/28/2014   Type 1 diabetes mellitus with other specified complication (HCC) 01/22/2013   Drug-induced constipation 05/13/2012   BIPOLAR DISORDER UNSPECIFIED 02/03/2009   Esophageal reflux 02/03/2009    Allergies:  Allergies  Allergen Reactions   Buspar [Buspirone] Other (See Comments)    Dizziness   Metoclopramide Hcl Other (See Comments)    "lock-jaw"   Medications:  Current Outpatient Medications:    fluticasone (FLONASE) 50  MCG/ACT nasal spray, Place 2 sprays into both nostrils daily., Disp: 16 g, Rfl: 0   naproxen (NAPROSYN) 500 MG tablet, Take 1 tablet (500 mg total) by mouth 2 (two) times daily with a meal., Disp: 30 tablet, Rfl: 0   oseltamivir (TAMIFLU) 75 MG capsule, Take 1 capsule (75 mg total) by mouth 2 (two) times daily., Disp:  10 capsule, Rfl: 0   promethazine-dextromethorphan (PROMETHAZINE-DM) 6.25-15 MG/5ML syrup, Take 5 mLs by mouth 4 (four) times daily as needed., Disp: 118 mL, Rfl: 0   Accu-Chek FastClix Lancets MISC, Use to monitor glucose 4 times daily, Disp: 150 each, Rfl: 1   amitriptyline (ELAVIL) 25 MG tablet, Take 25 mg by mouth daily., Disp: , Rfl:    glucose blood (ACCU-CHEK GUIDE) test strip, Use as instructed, Disp: 150 each, Rfl: 2   insulin NPH Human (NOVOLIN N) 100 UNIT/ML injection, Inject 40 Units into the skin every evening., Disp: , Rfl:    insulin regular (NOVOLIN R) 100 units/mL injection, Inject 15 Units into the skin 3 (three) times daily before meals., Disp: , Rfl:    naloxone (NARCAN) nasal spray 4 mg/0.1 mL, SMARTSIG:Both Nares, Disp: , Rfl:    oxyCODONE (OXY IR/ROXICODONE) 5 MG immediate release tablet, Take 1 tablet by mouth 3 (three) times daily., Disp: , Rfl:    Pancrelipase, Lip-Prot-Amyl, (ZENPEP) 40000-126000 units CPEP, Take one every 4 hours, Disp: 180 capsule, Rfl: 2   pantoprazole (PROTONIX) 40 MG tablet, TAKE ONE TABLET BY MOUTH ONCE DAILY., Disp: 30 tablet, Rfl: 5   promethazine (PHENERGAN) 50 MG tablet, TAKE 1 TABLET BY MOUTH EVERY 8 HOURS AS NEEDED FOR NAUSEA OR VOMITING., Disp: 270 tablet, Rfl: 3   XTAMPZA ER 9 MG C12A, Take 1 capsule by mouth every 12 (twelve) hours., Disp: , Rfl:    zolpidem (AMBIEN) 5 MG tablet, Take 1 tablet (5 mg total) by mouth at bedtime as needed for sleep., Disp: 30 tablet, Rfl: 1  Observations/Objective: Patient is well-developed, well-nourished in no acute distress.  Resting comfortably at home.  Head is normocephalic, atraumatic.  No labored breathing.  Speech is clear and coherent with logical content.  Patient is alert and oriented at baseline.    Assessment and Plan: 1. Flu-like symptoms - oseltamivir (TAMIFLU) 75 MG capsule; Take 1 capsule (75 mg total) by mouth 2 (two) times daily.  Dispense: 10 capsule; Refill: 0 -  promethazine-dextromethorphan (PROMETHAZINE-DM) 6.25-15 MG/5ML syrup; Take 5 mLs by mouth 4 (four) times daily as needed.  Dispense: 118 mL; Refill: 0 - fluticasone (FLONASE) 50 MCG/ACT nasal spray; Place 2 sprays into both nostrils daily.  Dispense: 16 g; Refill: 0 - naproxen (NAPROSYN) 500 MG tablet; Take 1 tablet (500 mg total) by mouth 2 (two) times daily with a meal.  Dispense: 30 tablet; Refill: 0  - Suspect influenza due to symptoms and positive exposure - Tamiflu prescribed - Promethazine DM for cough - Naproxen for body aches and fevers - Continue OTC medication of choice for symptomatic management - Push fluids - Rest - Seek in person evaluation if symptoms worsen or fail to improve   Follow Up Instructions: I discussed the assessment and treatment plan with the patient. The patient was provided an opportunity to ask questions and all were answered. The patient agreed with the plan and demonstrated an understanding of the instructions.  A copy of instructions were sent to the patient via MyChart unless otherwise noted below.    The patient was advised to call back or seek  an in-person evaluation if the symptoms worsen or if the condition fails to improve as anticipated.    Margaretann Loveless, PA-C

## 2023-09-02 NOTE — Patient Instructions (Signed)
Merrell R Yeley, thank you for joining Margaretann Loveless, PA-C for today's virtual visit.  While this provider is not your primary care provider (PCP), if your PCP is located in our provider database this encounter information will be shared with them immediately following your visit.   A [facility] MyChart account gives you access to today's visit and all your visits, tests, and labs performed at [facility] " click here if you don't have a [facility] MyChart account or go to [email or web address]  Consent: (Patient) [name] [name] provided verbal consent for this virtual visit at the beginning of the encounter.  Current Medications:  Current Outpatient Medications:    fluticasone (FLONASE) 50 MCG/ACT nasal spray, Place 2 sprays into both nostrils daily., Disp: 16 g, Rfl: 0   naproxen (NAPROSYN) 500 MG tablet, Take 1 tablet (500 mg total) by mouth 2 (two) times daily with a meal., Disp: 30 tablet, Rfl: 0   oseltamivir (TAMIFLU) 75 MG capsule, Take 1 capsule (75 mg total) by mouth 2 (two) times daily., Disp: 10 capsule, Rfl: 0   promethazine-dextromethorphan (PROMETHAZINE-DM) 6.25-15 MG/5ML syrup, Take 5 mLs by mouth 4 (four) times daily as needed., Disp: 118 mL, Rfl: 0   Accu-Chek FastClix Lancets MISC, Use to monitor glucose 4 times daily, Disp: 150 each, Rfl: 1   amitriptyline (ELAVIL) 25 MG tablet, Take 25 mg by mouth daily., Disp: , Rfl:    glucose blood (ACCU-CHEK GUIDE) test strip, Use as instructed, Disp: 150 each, Rfl: 2   insulin NPH Human (NOVOLIN N) 100 UNIT/ML injection, Inject 40 Units into the skin every evening., Disp: , Rfl:    insulin regular (NOVOLIN R) 100 units/mL injection, Inject 15 Units into the skin 3 (three) times daily before meals., Disp: , Rfl:    naloxone (NARCAN) nasal spray 4 mg/0.1 mL, SMARTSIG:Both Nares, Disp: , Rfl:    oxyCODONE (OXY IR/ROXICODONE) 5 MG immediate release tablet, Take 1 tablet by mouth 3 (three) times daily., Disp: , Rfl:     Pancrelipase, Lip-Prot-Amyl, (ZENPEP) 40000-126000 units CPEP, Take one every 4 hours, Disp: 180 capsule, Rfl: 2   pantoprazole (PROTONIX) 40 MG tablet, TAKE ONE TABLET BY MOUTH ONCE DAILY., Disp: 30 tablet, Rfl: 5   promethazine (PHENERGAN) 50 MG tablet, TAKE 1 TABLET BY MOUTH EVERY 8 HOURS AS NEEDED FOR NAUSEA OR VOMITING., Disp: 270 tablet, Rfl: 3   XTAMPZA ER 9 MG C12A, Take 1 capsule by mouth every 12 (twelve) hours., Disp: , Rfl:    zolpidem (AMBIEN) 5 MG tablet, Take 1 tablet (5 mg total) by mouth at bedtime as needed for sleep., Disp: 30 tablet, Rfl: 1   Medications ordered in this encounter:  Meds ordered this encounter  Medications   oseltamivir (TAMIFLU) 75 MG capsule    Sig: Take 1 capsule (75 mg total) by mouth 2 (two) times daily.    Dispense:  10 capsule    Refill:  0    Order Specific Question:   Supervising Provider    Answer:   Merrilee Jansky [id number]   promethazine-dextromethorphan (PROMETHAZINE-DM) 6.25-15 MG/5ML syrup    Sig: Take 5 mLs by mouth 4 (four) times daily as needed.    Dispense:  118 mL    Refill:  0    Order Specific Question:   Supervising Provider    Answer:   Merrilee Jansky [[id number]]   fluticasone (FLONASE) 50 MCG/ACT nasal spray    Sig: Place 2 sprays into both nostrils daily.  Dispense:  16 g    Refill:  0    Order Specific Question:   Supervising Provider    Answer:   Merrilee Jansky [[id number]]   naproxen (NAPROSYN) 500 MG tablet    Sig: Take 1 tablet (500 mg total) by mouth 2 (two) times daily with a meal.    Dispense:  30 tablet    Refill:  0    Order Specific Question:   Supervising Provider    Answer:   Merrilee Jansky [id number]     *If you need refills on other medications prior to your next appointment, please contact your pharmacy*  Follow-Up: Call back or seek an in-person evaluation if the symptoms worsen or if the condition fails to improve as anticipated.   Virtual Care [phone]  Other  Instructions Influenza, Adult Influenza is also called the flu. It's an infection that affects your respiratory tract. This includes your nose, throat, windpipe, and lungs. The flu is contagious. This means it spreads easily from person to person. It causes symptoms that are like a cold. It can also cause a high fever and body aches. What are the causes? The flu is caused by the influenza virus. You can get it by: Breathing in droplets that are in the air after an infected person coughs or sneezes. Touching something that has the virus on it and then touching your mouth, nose, or eyes. What increases the risk? You may be more likely to get the flu if: You don't wash your hands often. You're near a lot of people during cold and flu season. You touch your mouth, eyes, or nose without washing your hands first. You don't get a flu shot each year. You may also be more at risk for the flu and serious problems, such as a lung infection called pneumonia, if: You're older than 65. You're pregnant. Your immune system is weak. Your immune system is your body's defense system. You have a long-term, or chronic, condition, such as: Heart, kidney, or lung disease. Diabetes. A liver disorder. Asthma. You're very overweight. You have anemia. This is when you don't have enough red blood cells in your body. What are the signs or symptoms? Flu symptoms often start all of a sudden. They may last 4-14 days and include: Fever and chills. Headaches, body aches, or muscle aches. Sore throat. Cough. Runny or stuffy nose. Discomfort in your chest. Not wanting to eat as much as normal. Feeling weak or tired. Feeling dizzy. Nausea or vomiting. How is this diagnosed? The flu may be diagnosed based on your symptoms and medical history. You may also have a physical exam. A swab may be taken from your nose or throat and tested for the virus. How is this treated? If the flu is found early, you can be treated  with antiviral medicine. This may be given to you by mouth or through an IV. It can help you feel less sick and get better faster. Taking care of yourself at home can also help your symptoms get better. Your health care provider may tell you to: Take over-the-counter medicines. Drink lots of fluids. The flu often goes away on its own. If you have very bad symptoms or problems caused by the flu, you may need to be treated in a hospital. Follow these instructions at home: Activity Rest as needed. Get lots of sleep. Stay home from work or school as told by your provider. Leave home only to go see your provider.  Do not leave home for other reasons until you don't have a fever for 24 hours without taking medicine. Eating and drinking Take an oral rehydration solution (ORS). This is a drink that is sold at pharmacies and stores. Drink enough fluid to keep your pee pale yellow. Try to drink small amounts of clear fluids. These include water, ice chips, fruit juice mixed with water, and low-calorie sports drinks. Try to eat bland foods that are easy to digest. These include bananas, applesauce, rice, lean meats, toast, and crackers. Avoid drinks that have a lot of sugar or caffeine in them. These include energy drinks, regular sports drinks, and soda. Do not drink alcohol. Do not eat spicy or fatty foods. General instructions     Take your medicines only as told by your provider. Use a cool mist humidifier to add moisture to the air in your home. This can make it easier for you to breathe. You should also clean the humidifier every day. To do so: Empty the water. Pour clean water in. Cover your mouth and nose when you cough or sneeze. Wash your hands with soap and water often and for at least 20 seconds. It's extra important to do so after you cough or sneeze. If you can't use soap and water, use hand sanitizer. How is this prevented?  Get a flu shot every year. Ask your provider when you  should get your flu shot. Stay away from people who are sick during fall and winter. Fall and winter are cold and flu season. Contact a health care provider if: You get new symptoms. You have chest pain. You have watery poop, also called diarrhea. You have a fever. Your cough gets worse. You start to have more mucus. You feel like you may vomit, or you vomit. Get help right away if: You become short of breath or have trouble breathing. Your skin or nails turn blue. You have very bad pain or stiffness in your neck. You get a sudden headache or pain in your face or ear. You vomit each time you eat or drink. These symptoms may be an emergency. Call 911 right away. Do not wait to see if the symptoms will go away. Do not drive yourself to the hospital. This information is not intended to replace advice given to you by your health care provider. Make sure you discuss any questions you have with your health care provider. Document Revised: 11/01/2022 Document Reviewed: 11/01/2022 Elsevier Patient Education  2024 Elsevier Inc.    If you have been instructed to have an in-person evaluation today at a local Urgent Care facility, please use the link below. It will take you to a list of all of our available [facility], including address, phone number and hours of operation. Please do not delay care.  [facility]  If you or a family member do not have a primary care provider, use the link below to schedule a visit and establish care. When you choose a [facility] primary care physician or advanced practice provider, you gain a long-term partner in health. Find a Primary Care Provider  Learn more about [facility]'s in-office and virtual care options: [facility] - Get Care Now

## 2023-09-03 ENCOUNTER — Ambulatory Visit: Payer: Medicare HMO

## 2023-09-09 ENCOUNTER — Telehealth: Payer: Medicare HMO | Admitting: Physician Assistant

## 2023-09-09 DIAGNOSIS — J069 Acute upper respiratory infection, unspecified: Secondary | ICD-10-CM | POA: Diagnosis not present

## 2023-09-09 DIAGNOSIS — B9689 Other specified bacterial agents as the cause of diseases classified elsewhere: Secondary | ICD-10-CM

## 2023-09-09 DIAGNOSIS — F319 Bipolar disorder, unspecified: Secondary | ICD-10-CM | POA: Diagnosis not present

## 2023-09-09 MED ORDER — IPRATROPIUM BROMIDE 0.03 % NA SOLN: 2.0000 | Freq: Two times a day (BID) | NASAL | 0 refills | Status: DC

## 2023-09-09 MED ORDER — PREDNISONE 20 MG PO TABS: 40.0000 mg | ORAL_TABLET | Freq: Every day | ORAL | 0 refills | Status: DC

## 2023-09-09 MED ORDER — AMOXICILLIN-POT CLAVULANATE 875-125 MG PO TABS
1.0000 | ORAL_TABLET | Freq: Two times a day (BID) | ORAL | 0 refills | Status: DC
Start: 2023-09-09 — End: 2023-12-16

## 2023-09-09 NOTE — Patient Instructions (Signed)
[name] [name], thank you for joining Margaretann Loveless, PA-C for today's virtual visit.  While this provider is not your primary care provider (PCP), if your PCP is located in our provider database this encounter information will be shared with them immediately following your visit.   A [facility] MyChart account gives you access to today's visit and all your visits, tests, and labs performed at [facility] " click here if you don't have a [facility] MyChart account or go to [email or web address]  Consent: (Patient) [name] [name] provided verbal consent for this virtual visit at the beginning of the encounter.  Current Medications:  Current Outpatient Medications:    amoxicillin-clavulanate (AUGMENTIN) 875-125 MG tablet, Take 1 tablet by mouth 2 (two) times daily., Disp: 20 tablet, Rfl: 0   ipratropium (ATROVENT) 0.03 % nasal spray, Place 2 sprays into both nostrils every 12 (twelve) hours., Disp: 30 mL, Rfl: 0   predniSONE (DELTASONE) 20 MG tablet, Take 2 tablets (40 mg total) by mouth daily with breakfast., Disp: 10 tablet, Rfl: 0   Accu-Chek FastClix Lancets MISC, Use to monitor glucose 4 times daily, Disp: 150 each, Rfl: 1   amitriptyline (ELAVIL) 25 MG tablet, Take 25 mg by mouth daily., Disp: , Rfl:    fluticasone (FLONASE) 50 MCG/ACT nasal spray, Place 2 sprays into both nostrils daily., Disp: 16 g, Rfl: 0   glucose blood (ACCU-CHEK GUIDE) test strip, Use as instructed, Disp: 150 each, Rfl: 2   insulin NPH Human (NOVOLIN N) 100 UNIT/ML injection, Inject 40 Units into the skin every evening., Disp: , Rfl:    insulin regular (NOVOLIN R) 100 units/mL injection, Inject 15 Units into the skin 3 (three) times daily before meals., Disp: , Rfl:    naloxone (NARCAN) nasal spray 4 mg/0.1 mL, SMARTSIG:Both Nares, Disp: , Rfl:    naproxen (NAPROSYN) 500 MG tablet, Take 1 tablet (500 mg total) by mouth 2 (two) times daily with a meal., Disp: 30 tablet, Rfl: 0   oseltamivir  (TAMIFLU) 75 MG capsule, Take 1 capsule (75 mg total) by mouth 2 (two) times daily., Disp: 10 capsule, Rfl: 0   oxyCODONE (OXY IR/ROXICODONE) 5 MG immediate release tablet, Take 1 tablet by mouth 3 (three) times daily., Disp: , Rfl:    Pancrelipase, Lip-Prot-Amyl, (ZENPEP) 40000-126000 units CPEP, Take one every 4 hours, Disp: 180 capsule, Rfl: 2   pantoprazole (PROTONIX) 40 MG tablet, TAKE ONE TABLET BY MOUTH ONCE DAILY., Disp: 30 tablet, Rfl: 5   promethazine (PHENERGAN) 50 MG tablet, TAKE 1 TABLET BY MOUTH EVERY 8 HOURS AS NEEDED FOR NAUSEA OR VOMITING., Disp: 270 tablet, Rfl: 3   promethazine-dextromethorphan (PROMETHAZINE-DM) 6.25-15 MG/5ML syrup, Take 5 mLs by mouth 4 (four) times daily as needed., Disp: 118 mL, Rfl: 0   XTAMPZA ER 9 MG C12A, Take 1 capsule by mouth every 12 (twelve) hours., Disp: , Rfl:    zolpidem (AMBIEN) 5 MG tablet, Take 1 tablet (5 mg total) by mouth at bedtime as needed for sleep., Disp: 30 tablet, Rfl: 1   Medications ordered in this encounter:  Meds ordered this encounter  Medications   amoxicillin-clavulanate (AUGMENTIN) 875-125 MG tablet    Sig: Take 1 tablet by mouth 2 (two) times daily.    Dispense:  20 tablet    Refill:  0    Order Specific Question:   Supervising Provider    Answer:   Merrilee Jansky [[id number]]   ipratropium (ATROVENT) 0.03 % nasal spray    Sig:  Place 2 sprays into both nostrils every 12 (twelve) hours.    Dispense:  30 mL    Refill:  0    Order Specific Question:   Supervising Provider    Answer:   Merrilee Jansky [[id number]]   predniSONE (DELTASONE) 20 MG tablet    Sig: Take 2 tablets (40 mg total) by mouth daily with breakfast.    Dispense:  10 tablet    Refill:  0    Order Specific Question:   Supervising Provider    Answer:   Merrilee Jansky [[id number]]     *If you need refills on other medications prior to your next appointment, please contact your pharmacy*  Follow-Up: Call back or seek an in-person evaluation if the  symptoms worsen or if the condition fails to improve as anticipated.  [facility] [phone]  Other Instructions Upper Respiratory Infection, Adult An upper respiratory infection (URI) is a common viral infection of the nose, throat, and upper air passages that lead to the lungs. The most common type of URI is the common cold. URIs usually get better on their own, without medical treatment. What are the causes? A URI is caused by a virus. You may catch a virus by: Breathing in droplets from an infected person's cough or sneeze. Touching something that has been exposed to the virus (is contaminated) and then touching your mouth, nose, or eyes. What increases the risk? You are more likely to get a URI if: You are very young or very old. You have close contact with others, such as at work, school, or a health care facility. You smoke. You have long-term (chronic) heart or lung disease. You have a weakened disease-fighting system (immune system). You have nasal allergies or asthma. You are experiencing a lot of stress. You have poor nutrition. What are the signs or symptoms? A URI usually involves some of the following symptoms: Runny or stuffy (congested) nose. Cough. Sneezing. Sore throat. Headache. Fatigue. Fever. Loss of appetite. Pain in your forehead, behind your eyes, and over your cheekbones (sinus pain). Muscle aches. Redness or irritation of the eyes. Pressure in the ears or face. How is this diagnosed? This condition may be diagnosed based on your medical history and symptoms, and a physical exam. Your health care provider may use a swab to take a mucus sample from your nose (nasal swab). This sample can be tested to determine what virus is causing the illness. How is this treated? URIs usually get better on their own within 7-10 days. Medicines cannot cure URIs, but your health care provider may recommend certain medicines to help relieve symptoms, such  as: Over-the-counter cold medicines. Cough suppressants. Coughing is a type of defense against infection that helps to clear the respiratory system, so take these medicines only as recommended by your health care provider. Fever-reducing medicines. Follow these instructions at home: Activity Rest as needed. If you have a fever, stay home from work or school until your fever is gone or until your health care provider says your URI cannot spread to other people (is no longer contagious). Your health care provider may have you wear a face mask to prevent your infection from spreading. Relieving symptoms Gargle with a mixture of salt and water 3-4 times a day or as needed. To make salt water, completely dissolve -1 tsp (3-6 g) of salt in 1 cup (237 mL) of warm water. Use a cool-mist humidifier to add moisture to the air. This can  help you breathe more easily. Eating and drinking  Drink enough fluid to keep your urine pale yellow. Eat soups and other clear broths. General instructions  Take over-the-counter and prescription medicines only as told by your health care provider. These include cold medicines, fever reducers, and cough suppressants. Do not use any products that contain nicotine or tobacco. These products include cigarettes, chewing tobacco, and vaping devices, such as e-cigarettes. If you need help quitting, ask your health care provider. Stay away from secondhand smoke. Stay up to date on all immunizations, including the yearly (annual) flu vaccine. Keep all follow-up visits. This is important. How to prevent the spread of infection to others URIs can be contagious. To prevent the infection from spreading: Wash your hands with soap and water for at least 20 seconds. If soap and water are not available, use hand sanitizer. Avoid touching your mouth, face, eyes, or nose. Cough or sneeze into a tissue or your sleeve or elbow instead of into your hand or into the air.  Contact a  health care provider if: You are getting worse instead of better. You have a fever or chills. Your mucus is brown or red. You have yellow or brown discharge coming from your nose. You have pain in your face, especially when you bend forward. You have swollen neck glands. You have pain while swallowing. You have white areas in the back of your throat. Get help right away if: You have shortness of breath that gets worse. You have severe or persistent: Headache. Ear pain. Sinus pain. Chest pain. You have chronic lung disease along with any of the following: Making high-pitched whistling sounds when you breathe, most often when you breathe out (wheezing). Prolonged cough (more than 14 days). Coughing up blood. A change in your usual mucus. You have a stiff neck. You have changes in your: Vision. Hearing. Thinking. Mood. These symptoms may be an emergency. Get help right away. Call 911. Do not wait to see if the symptoms will go away. Do not drive yourself to the hospital. Summary An upper respiratory infection (URI) is a common infection of the nose, throat, and upper air passages that lead to the lungs. A URI is caused by a virus. URIs usually get better on their own within 7-10 days. Medicines cannot cure URIs, but your health care provider may recommend certain medicines to help relieve symptoms. This information is not intended to replace advice given to you by your health care provider. Make sure you discuss any questions you have with your health care provider. Document Revised: 04/26/2021 Document Reviewed: 04/26/2021 Elsevier Patient Education  2024 Elsevier Inc.    If you have been instructed to have an in-person evaluation today at a local Urgent Care facility, please use the link below. It will take you to a list of all of our available [facility], including address, phone number and hours of operation. Please do not delay care.  [facility]  If you or a family member do not have a primary care provider, use the link below to schedule a visit and establish care. When you choose a [facility] primary care physician or advanced practice provider, you gain a long-term partner in health. Find a Primary Care Provider  Learn more about [facility]'s in-office and virtual care options: [facility] - Get Care Now

## 2023-09-09 NOTE — Progress Notes (Signed)
Virtual Visit Consent   Moira R Hansmann, you are scheduled for a virtual visit with a [facility] provider today. Just as with appointments in the office, your consent must be obtained to participate. Your consent will be active for this visit and any virtual visit you may have with one of our providers in the next 365 days. If you have a MyChart account, a copy of this consent can be sent to you electronically.  As this is a virtual visit, video technology does not allow for your provider to perform a traditional examination. This may limit your provider's ability to fully assess your condition. If your provider identifies any concerns that need to be evaluated in person or the need to arrange testing (such as labs, EKG, etc.), we will make arrangements to do so. Although advances in technology are sophisticated, we cannot ensure that it will always work on either your end or our end. If the connection with a video visit is poor, the visit may have to be switched to a telephone visit. With either a video or telephone visit, we are not always able to ensure that we have a secure connection.  By engaging in this virtual visit, you consent to the provision of healthcare and authorize for your insurance to be billed (if applicable) for the services provided during this visit. Depending on your insurance coverage, you may receive a charge related to this service.  I need to obtain your verbal consent now. Are you willing to proceed with your visit today? Kanesha R Cange has provided verbal consent on 09/09/2023 for a virtual visit (video or telephone). Margaretann Loveless, PA-C  Date: 09/09/2023 9:34 AM  Virtual Visit via Video Note   I, Margaretann Loveless, connected with  KALAN RANG  ([id number], Apr 09, 1971) on 09/09/23 at  9:30 AM EST by a video-enabled telemedicine application and verified that I am speaking with the correct person using two identifiers.  Location: Patient: Virtual Visit Location  Patient: Home Provider: Virtual Visit Location Provider: Home Office   I discussed the limitations of evaluation and management by telemedicine and the availability of in person appointments. The patient expressed understanding and agreed to proceed.    History of Present Illness: GRAYCIN MARCUCCI is a 52 y.o. who identifies as a female who was assigned female at birth, and is being seen today for continued URI symptoms.  HPI: URI  This is a new problem. The current episode started 1 to 4 weeks ago (Seen 09/02/23, virtually, and felt possible influenza; treated with Tamiflu). The problem has been gradually worsening. There has been no fever. Associated symptoms include congestion, coughing (mildly productive), headaches, a plugged ear sensation, rhinorrhea (post nasal drainage) and a sore throat. Pertinent negatives include no diarrhea, ear pain, nausea, sinus pain, vomiting or wheezing. Associated symptoms comments: Bilat eyes had purulent discharge this morning. Treatments tried: tamiflu, promethazine dm, naproxen, flonase. The treatment provided no relief.     Problems:  Patient Active Problem List   Diagnosis Date Noted   Chronic pain of right wrist 07/08/2023   Diabetic gastroparesis (HCC) 05/17/2023   Port-A-Cath in place 01/02/2023   Pancreatic insufficiency 08/15/2021   Insomnia 06/28/2014   Type 1 diabetes mellitus with other specified complication (HCC) 01/22/2013   Drug-induced constipation 05/13/2012   BIPOLAR DISORDER UNSPECIFIED 02/03/2009   Esophageal reflux 02/03/2009    Allergies:  Allergies  Allergen Reactions   Buspar [Buspirone] Other (See Comments)    Dizziness   Metoclopramide  Hcl Other (See Comments)    "lock-jaw"   Medications:  Current Outpatient Medications:    amoxicillin-clavulanate (AUGMENTIN) 875-125 MG tablet, Take 1 tablet by mouth 2 (two) times daily., Disp: 20 tablet, Rfl: 0   ipratropium (ATROVENT) 0.03 % nasal spray, Place 2 sprays into both  nostrils every 12 (twelve) hours., Disp: 30 mL, Rfl: 0   predniSONE (DELTASONE) 20 MG tablet, Take 2 tablets (40 mg total) by mouth daily with breakfast., Disp: 10 tablet, Rfl: 0   Accu-Chek FastClix Lancets MISC, Use to monitor glucose 4 times daily, Disp: 150 each, Rfl: 1   amitriptyline (ELAVIL) 25 MG tablet, Take 25 mg by mouth daily., Disp: , Rfl:    fluticasone (FLONASE) 50 MCG/ACT nasal spray, Place 2 sprays into both nostrils daily., Disp: 16 g, Rfl: 0   glucose blood (ACCU-CHEK GUIDE) test strip, Use as instructed, Disp: 150 each, Rfl: 2   insulin NPH Human (NOVOLIN N) 100 UNIT/ML injection, Inject 40 Units into the skin every evening., Disp: , Rfl:    insulin regular (NOVOLIN R) 100 units/mL injection, Inject 15 Units into the skin 3 (three) times daily before meals., Disp: , Rfl:    naloxone (NARCAN) nasal spray 4 mg/0.1 mL, SMARTSIG:Both Nares, Disp: , Rfl:    naproxen (NAPROSYN) 500 MG tablet, Take 1 tablet (500 mg total) by mouth 2 (two) times daily with a meal., Disp: 30 tablet, Rfl: 0   oseltamivir (TAMIFLU) 75 MG capsule, Take 1 capsule (75 mg total) by mouth 2 (two) times daily., Disp: 10 capsule, Rfl: 0   oxyCODONE (OXY IR/ROXICODONE) 5 MG immediate release tablet, Take 1 tablet by mouth 3 (three) times daily., Disp: , Rfl:    Pancrelipase, Lip-Prot-Amyl, (ZENPEP) 40000-126000 units CPEP, Take one every 4 hours, Disp: 180 capsule, Rfl: 2   pantoprazole (PROTONIX) 40 MG tablet, TAKE ONE TABLET BY MOUTH ONCE DAILY., Disp: 30 tablet, Rfl: 5   promethazine (PHENERGAN) 50 MG tablet, TAKE 1 TABLET BY MOUTH EVERY 8 HOURS AS NEEDED FOR NAUSEA OR VOMITING., Disp: 270 tablet, Rfl: 3   promethazine-dextromethorphan (PROMETHAZINE-DM) 6.25-15 MG/5ML syrup, Take 5 mLs by mouth 4 (four) times daily as needed., Disp: 118 mL, Rfl: 0   XTAMPZA ER 9 MG C12A, Take 1 capsule by mouth every 12 (twelve) hours., Disp: , Rfl:    zolpidem (AMBIEN) 5 MG tablet, Take 1 tablet (5 mg total) by mouth at bedtime  as needed for sleep., Disp: 30 tablet, Rfl: 1  Observations/Objective: Patient is well-developed, well-nourished in no acute distress.  Resting comfortably at home.  Head is normocephalic, atraumatic.  No labored breathing.  Speech is clear and coherent with logical content.  Patient is alert and oriented at baseline.    Assessment and Plan: 1. Bacterial upper respiratory infection - amoxicillin-clavulanate (AUGMENTIN) 875-125 MG tablet; Take 1 tablet by mouth 2 (two) times daily.  Dispense: 20 tablet; Refill: 0 - ipratropium (ATROVENT) 0.03 % nasal spray; Place 2 sprays into both nostrils every 12 (twelve) hours.  Dispense: 30 mL; Refill: 0 - predniSONE (DELTASONE) 20 MG tablet; Take 2 tablets (40 mg total) by mouth daily with breakfast.  Dispense: 10 tablet; Refill: 0  - Worsening symptoms that have not responded to OTC medications and prescription medications  - Will give Augmentin, Prednisone, and Ipratropium Bromide nasal spray - Continue allergy medications.  - Steam and humidifier can help - Stay well hydrated and get plenty of rest.  - Seek in person evaluation if no symptom improvement or if  symptoms worsen   Follow Up Instructions: I discussed the assessment and treatment plan with the patient. The patient was provided an opportunity to ask questions and all were answered. The patient agreed with the plan and demonstrated an understanding of the instructions.  A copy of instructions were sent to the patient via MyChart unless otherwise noted below.    The patient was advised to call back or seek an in-person evaluation if the symptoms worsen or if the condition fails to improve as anticipated.    Margaretann Loveless, PA-C

## 2023-09-12 DIAGNOSIS — R109 Unspecified abdominal pain: Secondary | ICD-10-CM | POA: Diagnosis not present

## 2023-09-12 DIAGNOSIS — Z8719 Personal history of other diseases of the digestive system: Secondary | ICD-10-CM | POA: Diagnosis not present

## 2023-09-12 DIAGNOSIS — G8929 Other chronic pain: Secondary | ICD-10-CM | POA: Diagnosis not present

## 2023-09-12 DIAGNOSIS — F1120 Opioid dependence, uncomplicated: Secondary | ICD-10-CM | POA: Diagnosis not present

## 2023-09-12 DIAGNOSIS — Z79899 Other long term (current) drug therapy: Secondary | ICD-10-CM | POA: Diagnosis not present

## 2023-09-12 DIAGNOSIS — Z6820 Body mass index (BMI) 20.0-20.9, adult: Secondary | ICD-10-CM | POA: Diagnosis not present

## 2023-09-12 DIAGNOSIS — K861 Other chronic pancreatitis: Secondary | ICD-10-CM | POA: Diagnosis not present

## 2023-09-30 MED ORDER — PEG 3350-KCL-NA BICARB-NACL 420 G PO SOLR: 4000.0000 mL | Freq: Once | ORAL | 0 refills | Status: AC

## 2023-09-30 NOTE — Addendum Note (Signed)
Addended by: Marlowe Shores on: 09/30/2023 01:55 PM   Modules accepted: Orders

## 2023-09-30 NOTE — Telephone Encounter (Signed)
Pt contacted and scheduled for 10/31/23. Gave extended bowel prep instructions including 2 day prep and miralax bid 10 days. Prep sent to pharmacy. Will mail instructions once pre op has been set. Authorization approved via [employer]

## 2023-10-15 DIAGNOSIS — K861 Other chronic pancreatitis: Secondary | ICD-10-CM | POA: Diagnosis not present

## 2023-10-15 DIAGNOSIS — G8929 Other chronic pain: Secondary | ICD-10-CM | POA: Diagnosis not present

## 2023-10-15 DIAGNOSIS — Z79899 Other long term (current) drug therapy: Secondary | ICD-10-CM | POA: Diagnosis not present

## 2023-10-15 DIAGNOSIS — K589 Irritable bowel syndrome without diarrhea: Secondary | ICD-10-CM | POA: Diagnosis not present

## 2023-10-15 DIAGNOSIS — F1120 Opioid dependence, uncomplicated: Secondary | ICD-10-CM | POA: Diagnosis not present

## 2023-10-15 DIAGNOSIS — R109 Unspecified abdominal pain: Secondary | ICD-10-CM | POA: Diagnosis not present

## 2023-10-15 DIAGNOSIS — Z6821 Body mass index (BMI) 21.0-21.9, adult: Secondary | ICD-10-CM | POA: Diagnosis not present

## 2023-10-15 DIAGNOSIS — G894 Chronic pain syndrome: Secondary | ICD-10-CM | POA: Diagnosis not present

## 2023-10-15 DIAGNOSIS — Z6820 Body mass index (BMI) 20.0-20.9, adult: Secondary | ICD-10-CM | POA: Diagnosis not present

## 2023-10-18 DIAGNOSIS — Z79899 Other long term (current) drug therapy: Secondary | ICD-10-CM | POA: Diagnosis not present

## 2023-10-28 ENCOUNTER — Other Ambulatory Visit: Payer: Self-pay | Admitting: Family Medicine

## 2023-10-28 NOTE — Patient Instructions (Signed)
[name] [name]  10/28/2023     @PREFPERIOPPHARMACY @   Your procedure is scheduled on  10/31/2023.   Report to [facility] at  0600  A.M.  Call this number if you have problems the morning of surgery:  [phone]  If you experience any cold or flu symptoms such as cough, fever, chills, shortness of breath, etc. between now and your scheduled surgery, please notify us at the above number.   Remember:         Take 25 units of your night time insulin the night before your procedure.   Follow the diet and prep instructions given to you by the office.   You may drink clear liquids until 0330 am on 10/31/2023.    Clear liquids allowed are:                    Water, Juice (No red color; non-citric and without pulp; diabetics please choose diet or no sugar options), Carbonated beverages (diabetics please choose diet or no sugar options), Clear Tea (No creamer, milk, or cream, including half & half and powdered creamer), Black Coffee Only (No creamer, milk or cream, including half & half and powdered creamer), and Clear Sports drink (No red color; diabetics please choose diet or no sugar options)    Take these medicines the morning of surgery with A SIP OF WATER         amitriptyline, oxycodone or xtamza (IF NEEDED),  Pantoprazole, prednisone.    Do not wear jewelry, make-up or nail polish, including gel polish,  artificial nails, or any other type of covering on natural nails (fingers and  toes).  Do not wear lotions, powders, or perfumes, or deodorant.  Do not shave 48 hours prior to surgery.  Men may shave face and neck.  Do not bring valuables to the hospital.  [facility] is not responsible for any belongings or valuables.  Contacts, dentures or bridgework may not be worn into surgery.  Leave your suitcase in the car.  After surgery it may be brought to your room.  For patients admitted to the hospital, discharge time will be determined by your treatment team.  Patients  discharged the day of surgery will not be allowed to drive home and must have someone with them for 24 hours.    Special instructions:   DO NOT smoke tobacco or vape for 24 hours before your procedure.  Please read over the following fact sheets that you were given. Anesthesia Post-op Instructions and Care and Recovery After Surgery      Colonoscopy, Adult, Care After The following information offers guidance on how to care for yourself after your procedure. Your health care provider may also give you more specific instructions. If you have problems or questions, contact your health care provider. What can I expect after the procedure? After the procedure, it is common to have: A small amount of blood in your stool for 24 hours after the procedure. Some gas. Mild cramping or bloating of your abdomen. Follow these instructions at home: Eating and drinking  Drink enough fluid to keep your urine pale yellow. Follow instructions from your health care provider about eating or drinking restrictions. Resume your normal diet as told by your health care provider. Avoid heavy or fried foods that are hard to digest. Activity Rest as told by your health care provider. Avoid sitting for a long time without moving. Get up to take short walks  every 1-2 hours. This is important to improve blood flow and breathing. Ask for help if you feel weak or unsteady. Return to your normal activities as told by your health care provider. Ask your health care provider what activities are safe for you. Managing cramping and bloating  Try walking around when you have cramps or feel bloated. If directed, apply heat to your abdomen as told by your health care provider. Use the heat source that your health care provider recommends, such as a moist heat pack or a heating pad. Place a towel between your skin and the heat source. Leave the heat on for 20-30 minutes. Remove the heat if your skin turns bright red. This is  especially important if you are unable to feel pain, heat, or cold. You have a greater risk of getting burned. General instructions If you were given a sedative during the procedure, it can affect you for several hours. Do not drive or operate machinery until your health care provider says that it is safe. For the first 24 hours after the procedure: Do not sign important documents. Do not drink alcohol. Do your regular daily activities at a slower pace than normal. Eat soft foods that are easy to digest. Take over-the-counter and prescription medicines only as told by your health care provider. Keep all follow-up visits. This is important. Contact a health care provider if: You have blood in your stool 2-3 days after the procedure. Get help right away if: You have more than a small spotting of blood in your stool. You have large blood clots in your stool. You have swelling of your abdomen. You have nausea or vomiting. You have a fever. You have increasing pain in your abdomen that is not relieved with medicine. These symptoms may be an emergency. Get help right away. Call 911. Do not wait to see if the symptoms will go away. Do not drive yourself to the hospital. Summary After the procedure, it is common to have a small amount of blood in your stool. You may also have mild cramping and bloating of your abdomen. If you were given a sedative during the procedure, it can affect you for several hours. Do not drive or operate machinery until your health care provider says that it is safe. Get help right away if you have a lot of blood in your stool, nausea or vomiting, a fever, or increased pain in your abdomen. This information is not intended to replace advice given to you by your health care provider. Make sure you discuss any questions you have with your health care provider. Document Revised: 11/06/2022 Document Reviewed: 05/17/2021 Elsevier Patient Education  2024 [employer]Monitored  Anesthesia Care, Care After The following information offers guidance on how to care for yourself after your procedure. Your health care provider may also give you more specific instructions. If you have problems or questions, contact your health care provider. What can I expect after the procedure? After the procedure, it is common to have: Tiredness. Little or no memory about what happened during or after the procedure. Impaired judgment when it comes to making decisions. Nausea or vomiting. Some trouble with balance. Follow these instructions at home: For the time period you were told by your health care provider:  Rest. Do not participate in activities where you could fall or become injured. Do not drive or use machinery. Do not drink alcohol. Do not take sleeping pills or medicines that cause drowsiness. Do not make important decisions or  sign legal documents. Do not take care of children on your own. Medicines Take over-the-counter and prescription medicines only as told by your health care provider. If you were prescribed antibiotics, take them as told by your health care provider. Do not stop using the antibiotic even if you start to feel better. Eating and drinking Follow instructions from your health care provider about what you may eat and drink. Drink enough fluid to keep your urine pale yellow. If you vomit: Drink clear fluids slowly and in small amounts as you are able. Clear fluids include water, ice chips, low-calorie sports drinks, and fruit juice that has water added to it (diluted fruit juice). Eat light and bland foods in small amounts as you are able. These foods include bananas, applesauce, rice, lean meats, toast, and crackers. General instructions  Have a responsible adult stay with you for the time you are told. It is important to have someone help care for you until you are awake and alert. If you have sleep apnea, surgery and some medicines can increase your  risk for breathing problems. Follow instructions from your health care provider about wearing your sleep device: When you are sleeping. This includes during daytime naps. While taking prescription pain medicines, sleeping medicines, or medicines that make you drowsy. Do not use any products that contain nicotine or tobacco. These products include cigarettes, chewing tobacco, and vaping devices, such as e-cigarettes. If you need help quitting, ask your health care provider. Contact a health care provider if: You feel nauseous or vomit every time you eat or drink. You feel light-headed. You are still sleepy or having trouble with balance after 24 hours. You get a rash. You have a fever. You have redness or swelling around the IV site. Get help right away if: You have trouble breathing. You have new confusion after you get home. These symptoms may be an emergency. Get help right away. Call 911. Do not wait to see if the symptoms will go away. Do not drive yourself to the hospital. This information is not intended to replace advice given to you by your health care provider. Make sure you discuss any questions you have with your health care provider. Document Revised: 02/19/2022 Document Reviewed: 02/19/2022 Elsevier Patient Education  2024 [employer].

## 2023-10-29 ENCOUNTER — Encounter (HOSPITAL_COMMUNITY)
Admission: RE | Admit: 2023-10-29 | Discharge: 2023-10-29 | Disposition: A | Discharge status: Home / Self Care | Payer: Medicare HMO | Source: Ambulatory Visit | Attending: Gastroenterology | Admitting: Gastroenterology

## 2023-10-29 ENCOUNTER — Encounter (HOSPITAL_COMMUNITY): Payer: Self-pay

## 2023-10-29 VITALS — BP 114/74 | HR 85 | Ht 63.0 in | Wt 120.0 lb

## 2023-10-29 DIAGNOSIS — Z01818 Encounter for other preprocedural examination: Secondary | ICD-10-CM | POA: Diagnosis not present

## 2023-10-29 DIAGNOSIS — E1069 Type 1 diabetes mellitus with other specified complication: Secondary | ICD-10-CM | POA: Diagnosis not present

## 2023-10-29 LAB — BASIC METABOLIC PANEL
Anion gap: 12 (ref 5–15)
BUN: 25 mg/dL — ABNORMAL HIGH (ref 6–20)
CO2: 23 mmol/L (ref 22–32)
Calcium: 9.1 mg/dL (ref 8.9–10.3)
Chloride: 102 mmol/L (ref 98–111)
Creatinine, Ser: 0.54 mg/dL (ref 0.44–1.00)
GFR, Estimated: >60 mL/min (ref >60)
Glucose, Bld: 137 mg/dL — ABNORMAL HIGH (ref 70–99)
Potassium: 3.5 mmol/L (ref 3.5–5.1)
Sodium: 137 mmol/L (ref 135–145)

## 2023-10-29 MED ORDER — HEPARIN SOD (PORK) LOCK FLUSH 100 UNIT/ML IV SOLN
500.0000 [IU] | INTRAVENOUS | Status: AC | PRN
  Administered 2023-10-29: 500 [IU]

## 2023-10-29 MED ORDER — SODIUM CHLORIDE 0.9% FLUSH: 10.0000 mL | INTRAVENOUS | Status: DC | PRN

## 2023-10-31 ENCOUNTER — Ambulatory Visit (HOSPITAL_COMMUNITY): Admission: RE | Admit: 2023-10-31 | Payer: Medicare HMO | Source: Home / Self Care | Admitting: Gastroenterology

## 2023-10-31 ENCOUNTER — Encounter (HOSPITAL_COMMUNITY): Admission: RE | Payer: Self-pay | Source: Home / Self Care

## 2023-10-31 SURGERY — COLONOSCOPY WITH PROPOFOL
Anesthesia: Monitor Anesthesia Care

## 2023-10-31 NOTE — Progress Notes (Signed)
Patient called [facility] at 0600 and states: "I tried to do the colon prep but vomited it every time I took it, I know I'm not cleaned out, so I'm canceling the procedure for today.".   Informed the patient to call Dr. Marcelino Freestone office when they open today to see what the next step is.  Patient voiced understanding.

## 2023-11-04 MED ORDER — ZENPEP 40000-126000 UNITS PO CPEP: ORAL_CAPSULE | ORAL | 2 refills | Status: DC

## 2023-11-04 NOTE — Addendum Note (Signed)
Addended by: Vista Lawman on: 11/04/2023 07:32 AM   Modules accepted: Orders

## 2023-11-13 DIAGNOSIS — Z0000 Encounter for general adult medical examination without abnormal findings: Secondary | ICD-10-CM | POA: Diagnosis not present

## 2023-11-13 DIAGNOSIS — F1120 Opioid dependence, uncomplicated: Secondary | ICD-10-CM | POA: Diagnosis not present

## 2023-11-13 DIAGNOSIS — G43909 Migraine, unspecified, not intractable, without status migrainosus: Secondary | ICD-10-CM | POA: Diagnosis not present

## 2023-11-13 DIAGNOSIS — Z79899 Other long term (current) drug therapy: Secondary | ICD-10-CM | POA: Diagnosis not present

## 2023-11-13 DIAGNOSIS — K589 Irritable bowel syndrome without diarrhea: Secondary | ICD-10-CM | POA: Diagnosis not present

## 2023-11-13 DIAGNOSIS — R109 Unspecified abdominal pain: Secondary | ICD-10-CM | POA: Diagnosis not present

## 2023-11-13 DIAGNOSIS — K861 Other chronic pancreatitis: Secondary | ICD-10-CM | POA: Diagnosis not present

## 2023-11-13 DIAGNOSIS — G63 Polyneuropathy in diseases classified elsewhere: Secondary | ICD-10-CM | POA: Diagnosis not present

## 2023-11-13 DIAGNOSIS — G894 Chronic pain syndrome: Secondary | ICD-10-CM | POA: Diagnosis not present

## 2023-11-20 DIAGNOSIS — M79671 Pain in right foot: Secondary | ICD-10-CM | POA: Diagnosis not present

## 2023-11-20 DIAGNOSIS — S92301A Fracture of unspecified metatarsal bone(s), right foot, initial encounter for closed fracture: Secondary | ICD-10-CM | POA: Diagnosis not present

## 2023-12-02 ENCOUNTER — Ambulatory Visit (HOSPITAL_COMMUNITY): Payer: Medicare HMO

## 2023-12-16 ENCOUNTER — Ambulatory Visit (INDEPENDENT_AMBULATORY_CARE_PROVIDER_SITE_OTHER): Payer: Medicare HMO | Admitting: Gastroenterology

## 2023-12-16 ENCOUNTER — Encounter (INDEPENDENT_AMBULATORY_CARE_PROVIDER_SITE_OTHER): Payer: Self-pay | Admitting: Gastroenterology

## 2023-12-16 VITALS — BP 109/76 | HR 87 | Temp 98.1°F | Ht 63.0 in | Wt 130.6 lb

## 2023-12-16 DIAGNOSIS — E1043 Type 1 diabetes mellitus with diabetic autonomic (poly)neuropathy: Secondary | ICD-10-CM | POA: Diagnosis not present

## 2023-12-16 DIAGNOSIS — K439 Ventral hernia without obstruction or gangrene: Secondary | ICD-10-CM

## 2023-12-16 DIAGNOSIS — Z1211 Encounter for screening for malignant neoplasm of colon: Secondary | ICD-10-CM

## 2023-12-16 DIAGNOSIS — K8689 Other specified diseases of pancreas: Secondary | ICD-10-CM | POA: Diagnosis not present

## 2023-12-16 DIAGNOSIS — K5900 Constipation, unspecified: Secondary | ICD-10-CM

## 2023-12-16 DIAGNOSIS — E1143 Type 2 diabetes mellitus with diabetic autonomic (poly)neuropathy: Secondary | ICD-10-CM

## 2023-12-16 DIAGNOSIS — K5903 Drug induced constipation: Secondary | ICD-10-CM

## 2023-12-16 MED ORDER — PRUCALOPRIDE SUCCINATE 2 MG PO TABS: 2.0000 mg | ORAL_TABLET | Freq: Every day | ORAL | 2 refills | Status: DC

## 2023-12-16 MED ORDER — ZENPEP 40000-126000 UNITS PO CPEP: ORAL_CAPSULE | ORAL | 2 refills | Status: DC

## 2023-12-16 NOTE — Patient Instructions (Addendum)
 It was very nice to meet you today, as dicussed with will plan for the following :  1) Please order domperidone  2) start Prucalopride   3) Colonoscopy with 1 week of linzess and sutabs

## 2023-12-16 NOTE — Progress Notes (Signed)
 Vista Lawman , M.D. Gastroenterology & Hepatology [facility] [address] [place], [place] [address] Primary Care Physician: Tommie Sams, DO [address] [place] [address]  Chief Complaint: Greasy stools  History of Present Illness: [name] [name] is a 53 y.o. female with type 1 diabetes, chronic pancreatitis status post whipple 2011, ventral hernia containing transverse colon who presents for evaluation of steatorrhea, diabetic gastroparesis, constipation and colorectal cancer screening.  Patient has complex history and was previously followed at [facility] and [facility] by gastroenterologist.  Patient reports today that since she ran out of Zenpep(pancrelipase) her stools are oily and floats in the toilet bowl.  When she takes an Zenpep she denies any steatorrhea.  Patient reports currently she has no constipation having regular bowel movement daily without any difficulty, although she is not taking any laxatives.  She is on a chronic opioid regimen with oxycodone and long-acting narcotic and is managed by pain management.   Sometimes she becomes nauseous at the end of the day but takes Phenergan which is the only thing that works for her  Previously patient suffer from abdominal pain nausea vomiting and constipation was seen at [facility] where she underwent endoscopy gastric emptying test which was positive for delayed emptying, was treated empirically for SIBO with rifaximin, last A1c 8.9   Last EGD:2020  n: - Normal esophagus. - A gastrojejunostomy was found, characterized by healthy  appearing mucosa. - Food in stomach. - Normal stomach mucosa. Biopsied. - Normal examined jejunum. Food noted.   Last Colonoscopy:06/2023- aborted poor prep   Reports over 5 years ago but unable to find the actual report  FHx: neg for any gastrointestinal/liver disease, no malignancies Social: neg smoking, alcohol or illicit drug  use Surgical:Whipple , multiple hernia repair  Past Medical History: Past Medical History:  Diagnosis Date   Acute bacterial bronchitis    dx 12-06-2014  --  FINISHED Z-PAC-  NO FEVER BUT STILL INTERMITTANT  PRODUCTIVE COUGH   Anxiety    Chronic pain    has pain specialist   Diabetes mellitus type I (HCC)    GERD (gastroesophageal reflux disease)    History of chronic gastritis    History of chronic pancreatitis    multple recurrent--  s/p  whipple 2011   History of esophageal ulcer    History of kidney stones    History of subarachnoid hemorrhage    01-08-2013--  fell on concrete--  resolved without surgical intervention   History of suicide attempt    hx multiple attempts by overdosing of meds   IBS (irritable bowel syndrome)    Left ureteral calculus    Major depression, recurrent, chronic (HCC)    w/  Hx  multiple suicide attempts (overdose)   Migraine headache    Type 2 diabetes mellitus, uncontrolled    Whipple disease 2011    Past Surgical History: Past Surgical History:  Procedure Laterality Date   CESAREAN SECTION  x2  last one 02-29-2000   last one w/ BILATERAL TUBAL LIGATION   COLONOSCOPY  03-14-2009   dx ileocolonscopy   CYSTOSCOPY WITH RETROGRADE PYELOGRAM, URETEROSCOPY AND STENT PLACEMENT Left 12/20/2014   Procedure: CYSTOSCOPY, LEFT URETEROSCOPY, STONE REMOVAL, LASER LITHOTRIPSY;  Surgeon: Barron Alvine, MD;  Location: [facility];  Service: Urology;  Laterality: Left;   ESOPHAGOGASTRODUODENOSCOPY  last  one 12-31-2008   ESOPHAGOGASTRODUODENOSCOPY (EGD) WITH PROPOFOL N/A 05/28/2017   Procedure: ESOPHAGOGASTRODUODENOSCOPY (EGD) WITH PROPOFOL;  Surgeon: Malissa Hippo, MD;  [facility];  Service: Endoscopy;  Laterality: N/A;  2:00   FLEXIBLE SIGMOIDOSCOPY  06/13/2023   Procedure: FLEXIBLE SIGMOIDOSCOPY;  Surgeon: Franky Macho, MD;  Location: [facility];  Service: Endoscopy;;   HOLMIUM LASER APPLICATION Left 12/20/2014    Procedure: HOLMIUM LASER APPLICATION;  Surgeon: Barron Alvine, MD;  Location[facility];  Service: Urology;  Laterality: Left;   LAPAROSCOPIC ASSISTED VAGINAL HYSTERECTOMY  12-16-2006   LAPAROSCOPIC NISSEN FUNDOPLICATION  Nov 2001   w/  CHOLECYSTECTOMY   PORTACATH PLACEMENT N/A 07/12/2017   Procedure: INSERTION PORT-A-CATH;  Surgeon: Franky Macho, MD;  Location: [facility];  Service: General;  Laterality: N/A;   REPAIR PERFORATED VISCUS  POST WHIPPLE  x5    last one 06-23-2012   TUBAL LIGATION     WHIPPLE PROCEDURE  2011   at [facility]    Family History: Family History  Problem Relation Age of Onset   Hypertension Mother    Hyperlipidemia Mother    Diabetes Father    Stroke Father     Social History: Social History   Tobacco Use  Smoking Status Never  Smokeless Tobacco Never   Social History   Substance and Sexual Activity  Alcohol Use No   Social History   Substance and Sexual Activity  Drug Use Not Currently    Allergies: Allergies  Allergen Reactions   Buspar [Buspirone] Other (See Comments)    Dizziness   Metoclopramide Hcl Other (See Comments)    "lock-jaw"    Medications: Current Outpatient Medications  Medication Sig Dispense Refill   Accu-Chek FastClix Lancets MISC Use to monitor glucose 4 times daily 150 each 1   amitriptyline (ELAVIL) 25 MG tablet Take 25 mg by mouth daily.     glucose blood (ACCU-CHEK GUIDE) test strip Use as instructed 150 each 2   insulin NPH Human (NOVOLIN N) 100 UNIT/ML injection Inject 40 Units into the skin every evening.     insulin regular (NOVOLIN R) 100 units/mL injection Inject 15 Units into the skin 3 (three) times daily before meals.     naloxone (NARCAN) nasal spray 4 mg/0.1 mL SMARTSIG:Both Nares     oxyCODONE (OXY IR/ROXICODONE) 5 MG immediate release tablet Take 1 tablet by mouth 3 (three) times daily.     promethazine (PHENERGAN) 50 MG tablet TAKE 1 TABLET BY MOUTH EVERY 8 HOURS AS NEEDED FOR NAUSEA OR  VOMITING. 270 tablet 3   Prucalopride Succinate 2 MG TABS Take 1 tablet (2 mg total) by mouth daily. 30 tablet 2   zolpidem (AMBIEN) 5 MG tablet TAKE ONE TABLET BY MOUTH AT BEDTIME AS NEEDED FOR SLEEP 30 tablet 1   Pancrelipase, Lip-Prot-Amyl, (ZENPEP) 40000-126000 units CPEP Take one every 4 hours 180 capsule 2   pantoprazole (PROTONIX) 40 MG tablet TAKE ONE TABLET BY MOUTH ONCE DAILY. (Patient not taking: Reported on 12/16/2023) 30 tablet 5   No current facility-administered medications for this visit.    Review of Systems: GENERAL: negative for malaise, night sweats HEENT: No changes in hearing or vision, no nose bleeds or other nasal problems. NECK: Negative for lumps, goiter, pain and significant neck swelling RESPIRATORY: Negative for cough, wheezing CARDIOVASCULAR: Negative for chest pain, leg swelling, palpitations, orthopnea GI: SEE HPI MUSCULOSKELETAL: Negative for joint pain or swelling, back pain, and muscle pain. SKIN: Negative for lesions, rash HEMATOLOGY Negative for prolonged bleeding, bruising easily, and swollen nodes. ENDOCRINE: Negative for cold or heat intolerance, polyuria, polydipsia  and goiter. NEURO: negative for tremor, gait imbalance, syncope and seizures. The remainder of the review of systems is noncontributory.   Physical Exam: BP 109/76   Pulse 87   Temp 98.1 F (36.7 C) (Oral)   Ht 5\' 3"  (1.6 m)   Wt 130 lb 9.6 oz (59.2 kg)   BMI 23.13 kg/m  GENERAL: The patient is AO x3, in no acute distress. HEENT: Head is normocephalic and atraumatic. EOMI are intact. Mouth is well hydrated and without lesions. NECK: Supple. No masses LUNGS: Clear to auscultation. No presence of rhonchi/wheezing/rales. Adequate chest expansion HEART: RRR, normal s1 and s2. ABDOMEN: Soft, nontender, no guarding, no peritoneal signs, and nondistended. BS +. No masses. Multiple scar tissue with ventral hernia  EXTREMITIES: Without any cyanosis, clubbing, rash, lesions or  edema NEUROLOGIC: AOx3, no focal motor deficit. SKIN: no jaundice, no rashes   Imaging/Labs: as above  I personally reviewed and interpreted the available labs, imaging and endoscopic files.  2020  The stomach is located normally in the left upper abdomen.   Solid gastric emptying at   57 minutes is 20% (normal range at   60 minutes  is >10%).  Solid gastric emptying at 118 minutes is 35% (normal range at 120 minutes  is >40%).  Solid gastric emptying at 234 minutes is 43% (normal range at 240 minutes  is >90%).   Impression :   Solid gastric emptying is normal at 57 minutes and delayed at 118 and 234  minutes; see above discussion.   Impression and Plan:  ADRIONA KANEY is a 53 y.o. female with type 1 diabetes, chronic pancreatitis status post whipple 2011, ventral hernia containing transverse colon who presents for evaluation of steatorrhea, diabetic gastroparesis, constipation and colorectal cancer screening.  # Steatorrhea # Pancreatic insufficiency  Patient is known to have pancreatic insufficiency due to having a chronic pancreatitis due to type 1 diabetes status post Whipple's procedure  Prescribed Zenpep as patient was unable to tolerate Creon previously because of abdominal pain and worsening nausea.  Reports she did well for years   Low-fat diet and pancrelipase before every meal  # Gastroparesis # Constipation  Patient is on narcotics (oxycodone and long-acting opioids) , continues to be constipated with colonoscopy poor prep   Patient continues to have post prandial abdominal pain and nausea   Advise adequate glucose control.  Diet Recommendations for Gastroparesis: Small, Frequent Meals: Eat smaller meals more often to reduce the burden on the stomach. Low-Fat Foods: Fat slows gastric emptying; opt for low-fat options. Low-Fiber Foods: Fiber can be difficult to digest; choose low-fiber foods to ease symptoms. Soft or Liquid Foods: Easier to digest,  such as soups, smoothies, and pureed foods. Chew Food Thoroughly: Helps with digestion and reduces strain on the stomach. Hydration: Drink plenty of water throughout the day.  Foods to Consider: Lean proteins: Chicken, fish, tofu. Refined grains: White bread, white rice, pasta. Cooked vegetables: Without skins or seeds. Low-fat dairy: Milk, yogurt.  Foods to Avoid: High-fat foods: Fried foods, creamy sauces. High-fiber foods: Raw vegetables, whole grains. Carbonated beverages: Can cause bloating.  Additional Tips: Sit up after eating: To help with digestion. Use a blender: To make food easier to consume.  Patient can not take metoclopramide due to allergies , info given for domperidone Will start patient on Prucalopride 2mg  onec daily which will help with both opiod induced constipation and underlying gastroparesis  #CRC Screening  I had attempted colonoscopy September 2024 but it was absolutely  poor prep and hence was aborted  Will proceed with colorectal cancer screening.  Patient has ventral hernia for surgical evaluation was advised that her colonoscopy may be challenging and there is a small risk of incarcerated hernia given transverse colon and hernia sac  Patient was unable to drink bowel prep twice due to nausea and vomiting, neck step is to do bowel prep with Sutab, and 1 week of Linzess (samples given)  All questions were answered.      Vista Lawman, MD Gastroenterology and Hepatology [facility]   This chart has been completed using [facility] Dictation software, and while attempts have been made to ensure accuracy , certain words and phrases may not be transcribed as intended

## 2023-12-17 ENCOUNTER — Telehealth (INDEPENDENT_AMBULATORY_CARE_PROVIDER_SITE_OTHER): Payer: Self-pay | Admitting: *Deleted

## 2023-12-17 NOTE — Telephone Encounter (Signed)
 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 I put on request she is needing for opioid induced constipation and gastroparesis. It says in the letter she needs to try and fail lactulose and lubiprostone.  You can write a letter for appeal if you want to appeal this decision. Letter of denial copied below:  [employer] Clinical Pharmacy Review [address] [address] [place], [place] [address] 12/16/2023 [id number] Dear [name] [name], We received a request from your doctor for a  prior authorization on PRUCALOPRIDE 2 MG  TABLET. Based on the information received, we  are unable to approve the request to have this  drug covered under your Part D benefit. The drug you asked for is not listed in your  preferred drug list (formulary). The preferred  drug(s), you may not have tried, are:  lactulose oral solution  lubiprostone capsule Your provider needs to give [place] medical reasons  why the preferred drug(s) would not work for  you and/or would have bad side effects.  Sometimes a preferred drug needs more review  for approval. Additionally, some preferred  drugs listed may be the same drugs with  different strengths or forms. [employer] may only  require one strength or form of that drug to be  tried. This decision was from [employer]'s NonFormulary Exceptions Coverage Policy at  [email or web address]. What are my options?  Ask your doctor if switching to other  covered alternatives is right for you. o Visit [employer]'s website to view the  Medicare Drug List for potential  covered alternatives  http://www.[place].org/   Drug Pricing Below you will find information on the price  of the drug you requested coverage for as  well as drugs covered on your prescription  drug plan.   Requested drug:  Pricing if your requested medication is approved:   <HCS NAME>  Estimated price at retail*: Quantity: Estimated price at <CW or CWSP>:  Quantity: Covered alternatives  <Alt NAME 1> Estimated price at retail:   Quantity: Estimated price at <CWP or CWSP:  Quantity:  <Alt NAME 2> Estimated price at retail:  Quantity: Estimated price at <CWP or CWSP>:  Quantity:  <Alt NAME 3> Estimated price at retail:  Quantity: Estimated price at <CWP or CWSP>:  Quantity:  <Alt NAME 4> Estimated price at retail:  Quantity: Estimated price at <CWP or CWSP>:  Quantity: <Alt NAME 5> Estimated price at retail:  Quantity: Estimated price at <CWP or CWSP>:  Quantity: *Drug pricing calculated at the point in time  this letter was generated <<My Rx Info  date>> and is dependent on your stage of  coverage in your current plan. Retail  pharmacy pricing based on in-network  pharmacy selected from your plan.>  Drug Pricing Below you will find information on the price  of the drug you requested coverage for as  well as drugs covered on your prescription  drug plan.   Requested drug:  Pricing if your requested medication is approved:   <HCS NAME>  Estimated price at retail*: Quantity: Estimated price at <CW or CWSP>:  Quantity: Covered alternatives  <Alt NAME 1> Estimated price at retail:  Quantity: Estimated price at <CWP or CWSP:  Quantity:  <Alt NAME 2> Estimated price at retail:  Quantity: Estimated price at <CWP or CWSP>:  Quantity:  <Alt NAME 3> Estimated price at retail:  Quantity: Estimated price at <CWP or CWSP>:  Quantity:  <Alt NAME 4> Estimated price at retail:  Quantity: Estimated price at <CWP or CWSP>:  Quantity: <Alt NAME 5> Estimated price at retail:  Quantity: Estimated price at <CWP or CWSP>:  Quantity: *Drug pricing calculated at the point in time  this letter was generated <<My Rx Info  date>> and is dependent on your stage of  coverage in your current plan. Retail  pharmacy pricing based on in-network  pharmacy selected from your plan.>  Drug Pricing Below you will find information on the price of the  drug you requested coverage for as well as drugs   covered on your prescription drug plan. Requested drug: Pricing if your requested medication is approved:  prucalopride 2 mg tablet Estimated price at retail: 193.56 Drug Quantity/Day Supply: 30.000/30 Estimated price at mail: 580.28 Drug Quantity/Day Supply: 90.000/90 Covered alternatives lactulose solution, oral Estimated price at retail: 0.72 Drug Quantity/Day Supply: 30.000/30 Estimated price at mail: 0.00 Drug Quantity/Day Supply: 90.000/90 lubiprostone capsule Estimated price at retail: 4.90 Drug Quantity/Day Supply: 30.000/30 Estimated price at mail: 4.90 Drug Quantity/Day Supply: 90.000/90 *Drug pricing calculated at the point in time this  letter was generated Dec 16 2023 4:41PM and is  dependent on your stage of coverage in your  current plan. Retail pharmacy pricing based on [employer] selected from your plan. [id number] n- coverages.  File an appeal- if you disagree with the  decision made to deny the formulary  exception request, you can file an appeal.  Appeal instructions attached. o Your appeal must be filed within 65 days 02/19/2024 o You can call [phone] to  request an appeal or follow the  instructions attached. For information on which pharmacies are in your  network, you can visit  [email or web address] For questions regarding your plan coverage, call

## 2023-12-18 MED ORDER — SUTAB 1479-225-188 MG PO TABS: ORAL_TABLET | ORAL | 0 refills | Status: DC

## 2023-12-18 NOTE — Addendum Note (Signed)
 Addended by: Marlowe Shores on: 12/18/2023 11:33 AM   Modules accepted: Orders

## 2023-12-19 ENCOUNTER — Telehealth (INDEPENDENT_AMBULATORY_CARE_PROVIDER_SITE_OTHER): Payer: Self-pay | Admitting: *Deleted

## 2023-12-19 NOTE — Telephone Encounter (Signed)
 Patient needs information about domperidone medication about the in house pharmacy. She is aware the information is at the main [address] office and we will back on Monday.   [phone]

## 2023-12-20 NOTE — Telephone Encounter (Signed)
 We back at [address] office today and I gave patient the number to inhouse pharmacy for domperidone number [phone] and she will call to get medication. Dr. Tasia Catchings how do you want her to take the medication?

## 2023-12-23 ENCOUNTER — Ambulatory Visit (HOSPITAL_COMMUNITY): Payer: Medicare HMO

## 2023-12-23 NOTE — Telephone Encounter (Signed)
 I called the patient and made her aware of start domperidone 10mg  TID  , before meals and optionally at bedtime. Patient states understanding.

## 2023-12-27 ENCOUNTER — Other Ambulatory Visit: Payer: Self-pay | Admitting: Emergency Medicine

## 2023-12-27 ENCOUNTER — Encounter: Attending: Family Medicine | Admitting: *Deleted

## 2023-12-27 VITALS — BP 142/88 | HR 93 | Temp 97.3°F | Resp 16

## 2023-12-27 DIAGNOSIS — Z452 Encounter for adjustment and management of vascular access device: Secondary | ICD-10-CM

## 2023-12-27 DIAGNOSIS — E1069 Type 1 diabetes mellitus with other specified complication: Secondary | ICD-10-CM

## 2023-12-27 DIAGNOSIS — Z95828 Presence of other vascular implants and grafts: Secondary | ICD-10-CM

## 2023-12-27 DIAGNOSIS — K8689 Other specified diseases of pancreas: Secondary | ICD-10-CM

## 2023-12-27 MED ORDER — HEPARIN SOD (PORK) LOCK FLUSH 100 UNIT/ML IV SOLN
500.0000 [IU] | Freq: Once | INTRAVENOUS | Status: AC | PRN
  Administered 2023-12-27: 500 [IU]

## 2023-12-27 NOTE — Progress Notes (Signed)
 [name] [name] presented for Portacath access and flush.  Proper placement of portacath confirmed by CXR.  Portacath located left chest wall accessed with  H 20 needle.  Good blood return present. Portacath flushed with 20ml NS and 500U/6ml Heparin and needle removed intact.  Procedure tolerated well and without incident.

## 2023-12-28 LAB — COMPREHENSIVE METABOLIC PANEL
AG Ratio: 1.4 (calc) (ref 1.0–2.5)
ALT: 18 U/L (ref 6–29)
AST: 18 U/L (ref 10–35)
Albumin: 3.7 g/dL (ref 3.6–5.1)
Alkaline phosphatase (APISO): 104 U/L (ref 37–153)
BUN: 14 mg/dL (ref 7–25)
CO2: 23 mmol/L (ref 20–32)
Calcium: 8.5 mg/dL — ABNORMAL LOW (ref 8.6–10.4)
Chloride: 107 mmol/L (ref 98–110)
Creat: 0.62 mg/dL (ref 0.50–1.03)
Globulin: 2.6 g/dL (ref 1.9–3.7)
Glucose, Bld: 183 mg/dL — ABNORMAL HIGH (ref 65–99)
Potassium: 3.5 mmol/L (ref 3.5–5.3)
Sodium: 139 mmol/L (ref 135–146)
Total Bilirubin: 0.5 mg/dL (ref 0.2–1.2)
Total Protein: 6.3 g/dL (ref 6.1–8.1)
eGFR: 107 mL/min/{1.73_m2} (ref >=60)

## 2023-12-28 LAB — LIPID PANEL
Cholesterol: 84 mg/dL (ref <200)
HDL: 29 mg/dL — ABNORMAL LOW (ref >=50)
LDL Cholesterol (Calc): 40 mg/dL
Non-HDL Cholesterol (Calc): 55 mg/dL (ref <130)
Total CHOL/HDL Ratio: 2.9 (calc) (ref <5.0)
Triglycerides: 74 mg/dL (ref <150)

## 2023-12-28 LAB — CBC WITH DIFFERENTIAL/PLATELET
Absolute Lymphocytes: 2689 {cells}/uL (ref 850–3900)
Absolute Monocytes: 302 {cells}/uL (ref 200–950)
Basophils Absolute: 32 {cells}/uL (ref 0–200)
Basophils Relative: 0.6 %
Eosinophils Absolute: 49 {cells}/uL (ref 15–500)
Eosinophils Relative: 0.9 %
HCT: 38.9 % (ref 35.0–45.0)
Hemoglobin: 13 g/dL (ref 11.7–15.5)
MCH: 32 pg (ref 27.0–33.0)
MCHC: 33.4 g/dL (ref 32.0–36.0)
MCV: 95.8 fL (ref 80.0–100.0)
MPV: 11.3 fL (ref 7.5–12.5)
Monocytes Relative: 5.6 %
Neutro Abs: 2327 {cells}/uL (ref 1500–7800)
Neutrophils Relative %: 43.1 %
Platelets: 198 10*3/uL (ref 140–400)
RBC: 4.06 10*6/uL (ref 3.80–5.10)
RDW: 12.9 % (ref 11.0–15.0)
Total Lymphocyte: 49.8 %
WBC: 5.4 10*3/uL (ref 3.8–10.8)

## 2023-12-28 LAB — HEMOGLOBIN A1C
Hgb A1c MFr Bld: 9.9 %{Hb} — ABNORMAL HIGH (ref <5.7)
Mean Plasma Glucose: 237 mg/dL
eAG (mmol/L): 13.2 mmol/L

## 2023-12-29 ENCOUNTER — Encounter: Payer: Self-pay | Admitting: Family Medicine

## 2024-01-02 ENCOUNTER — Telehealth (INDEPENDENT_AMBULATORY_CARE_PROVIDER_SITE_OTHER): Payer: Self-pay | Admitting: Gastroenterology

## 2024-01-02 NOTE — Telephone Encounter (Signed)
 Pt called in to reschedule TCS. Pt rescheduled from 4/3 at 8:15 to 4/24 at 10:15. New instructions will be mailed once new pre op is received.

## 2024-01-06 ENCOUNTER — Encounter: Payer: Self-pay | Admitting: Family Medicine

## 2024-01-06 ENCOUNTER — Ambulatory Visit (INDEPENDENT_AMBULATORY_CARE_PROVIDER_SITE_OTHER): Payer: Medicare HMO | Admitting: Family Medicine

## 2024-01-06 VITALS — BP 112/78 | HR 85 | Temp 97.7°F | Ht 63.0 in | Wt 130.0 lb

## 2024-01-06 DIAGNOSIS — E1069 Type 1 diabetes mellitus with other specified complication: Secondary | ICD-10-CM

## 2024-01-06 DIAGNOSIS — Z23 Encounter for immunization: Secondary | ICD-10-CM

## 2024-01-06 DIAGNOSIS — G4709 Other insomnia: Secondary | ICD-10-CM

## 2024-01-06 MED ORDER — ZOLPIDEM TARTRATE 5 MG PO TABS: 5.0000 mg | ORAL_TABLET | Freq: Every evening | ORAL | 3 refills | Status: DC | PRN

## 2024-01-06 MED ORDER — FREESTYLE LIBRE 3 SENSOR MISC: 3 refills | Status: AC

## 2024-01-06 MED ORDER — ACCU-CHEK GUIDE TEST VI STRP: ORAL_STRIP | 12 refills | Status: AC

## 2024-01-06 NOTE — Progress Notes (Signed)
 Subjective:  Patient ID: [name] [name], female    DOB: [birth year]  Age: 53 y.o. MRN: [id number]  CC:   Chief Complaint  Patient presents with   Diabetes Mellitus    DM follow up  Glucose levels have been high     HPI:  53 year old female presents for follow-up.  Patient has not seen endocrinology.  A1c on 3/21 was 9.9.  Patient is currently on NPH 40 units in the evening and 15 units of regular insulin 3 times a day.  Patient endorses poor compliance.  Will discuss today.  Insomnia stable on Ambien.  Needs refill.  Follows with GI regarding pancreatic insufficiency as well as diabetic gastroparesis.  Needs foot exam today.  Needs pneumococcal vaccine.  Needs urine ACR.  Mammogram scheduled.  Patient Active Problem List   Diagnosis Date Noted   Diabetic gastroparesis (HCC) 05/17/2023   Port-A-Cath in place 01/02/2023   Pancreatic insufficiency 08/15/2021   Insomnia 06/28/2014   Type 1 diabetes mellitus with other specified complication (HCC) 01/22/2013   Drug-induced constipation 05/13/2012   Esophageal reflux 02/03/2009    Social Hx   Social History   Socioeconomic History   Marital status: Legally Separated    Spouse name: Not on file   Number of children: Not on file   Years of education: Not on file   Highest education level: Some college, no degree  Occupational History   Not on file  Tobacco Use   Smoking status: Never   Smokeless tobacco: Never  Vaping Use   Vaping status: Never Used  Substance and Sexual Activity   Alcohol use: No   Drug use: Not Currently   Sexual activity: Yes    Birth control/protection: Surgical  Other Topics Concern   Not on file  Social History Narrative   Not on file   Social Drivers of Health   Financial Resource Strain: Medium Risk (07/08/2023)   Overall Financial Resource Strain (CARDIA)    Difficulty of Paying Living Expenses: Somewhat hard  Food Insecurity: No Food Insecurity (07/08/2023)   Hunger Vital Sign     Worried About Running Out of Food in the Last Year: Never true    Ran Out of Food in the Last Year: Never true  Transportation Needs: No Transportation Needs (07/08/2023)   PRAPARE - Administrator, Civil Service (Medical): No    Lack of Transportation (Non-Medical): No  Physical Activity: Insufficiently Active (07/08/2023)   Exercise Vital Sign    Days of Exercise per Week: 3 days    Minutes of Exercise per Session: 30 min  Stress: No Stress Concern Present (07/08/2023)   [employer] of Occupational Health - Occupational Stress Questionnaire    Feeling of Stress : Only a little  Social Connections: Moderately Integrated (07/08/2023)   Social Connection and Isolation Panel [NHANES]    Frequency of Communication with Friends and Family: More than three times a week    Frequency of Social Gatherings with Friends and Family: Once a week    Attends Religious Services: More than 4 times per year    Active Member of [employer] or Organizations: No    Attends Banker Meetings: Never    Marital Status: Married  Recent Concern: Social Connections - Moderately Isolated (05/31/2023)   Social Connection and Isolation Panel [NHANES]    Frequency of Communication with Friends and Family: More than three times a week    Frequency of Social Gatherings with Friends and Family: More  than three times a week    Attends Religious Services: Never    Active Member of Clubs or Organizations: No    Attends Banker Meetings: Never    Marital Status: Married    Review of Systems Per HPI  Objective:  BP 112/78   Pulse 85   Temp 97.7 F (36.5 C)   Ht 5\' 3"  (1.6 m)   Wt 130 lb (59 kg)   SpO2 97%   BMI 23.03 kg/m      01/06/2024    9:48 AM 12/27/2023    1:00 PM 12/16/2023    8:22 AM  BP/Weight  Systolic BP 112 142 109  Diastolic BP 78 88 76  Wt. (Lbs) 130  130.6  BMI 23.03 kg/m2  23.13 kg/m2    Physical Exam Vitals and nursing note reviewed.  Constitutional:       General: She is not in acute distress.    Appearance: Normal appearance.  HENT:     Head: Normocephalic and atraumatic.  Eyes:     General:        Right eye: No discharge.        Left eye: No discharge.     Conjunctiva/sclera: Conjunctivae normal.  Cardiovascular:     Rate and Rhythm: Normal rate and regular rhythm.  Pulmonary:     Effort: Pulmonary effort is normal.     Breath sounds: Normal breath sounds. No wheezing, rhonchi or rales.  Neurological:     Mental Status: She is alert.  Psychiatric:        Mood and Affect: Mood normal.        Behavior: Behavior normal.     Lab Results  Component Value Date   WBC 5.4 12/27/2023   HGB 13.0 12/27/2023   HCT 38.9 12/27/2023   PLT 198 12/27/2023   GLUCOSE 183 (H) 12/27/2023   CHOL 84 12/27/2023   TRIG 74 12/27/2023   HDL 29 (L) 12/27/2023   LDLCALC 40 12/27/2023   ALT 18 12/27/2023   AST 18 12/27/2023   NA 139 12/27/2023   K 3.5 12/27/2023   CL 107 12/27/2023   CREATININE 0.62 12/27/2023   BUN 14 12/27/2023   CO2 23 12/27/2023   INR 1.14 01/08/2013   HGBA1C 9.9 (H) 12/27/2023     Assessment & Plan:  Type 1 diabetes mellitus with other specified complication (HCC) Assessment & Plan: Uncontrolled. Worsening. Compliance is an issue.  Reaching out to our pharmacist to help with management.  She does not want to see endocrinology.  Plan to change both NPH and regular insulin.  Rx sent for continuous glucometer.  Orders: -     FreeStyle Libre 3 Sensor; Place 1 sensor on the skin every 14 days. Use to check glucose continuously  Dispense: 6 each; Refill: 3 -     Microalbumin / creatinine urine ratio -     Accu-Chek Guide Test; Use up to 4 times daily to check blood sugar.  Dispense: 100 each; Refill: 12  Immunization due -     Pneumococcal conjugate vaccine 20-valent  Other insomnia Assessment & Plan: Stable.  Ambien refilled.  Orders: -     Zolpidem Tartrate; Take 1 tablet (5 mg total) by mouth at bedtime as  needed. for sleep  Dispense: 30 tablet; Refill: 3    Follow-up:  1 month  Sequita Wise DO [facility]

## 2024-01-06 NOTE — Assessment & Plan Note (Signed)
Stable.  Ambien refilled. 

## 2024-01-06 NOTE — Assessment & Plan Note (Signed)
 Uncontrolled. Worsening. Compliance is an issue.  Reaching out to our pharmacist to help with management.  She does not want to see endocrinology.  Plan to change both NPH and regular insulin.  Rx sent for continuous glucometer.

## 2024-01-06 NOTE — Patient Instructions (Signed)
 Follow up in 1 month.  I am reaching out to my pharmacist.  Take care  Dr. Adriana Simas

## 2024-01-07 ENCOUNTER — Encounter (HOSPITAL_COMMUNITY)

## 2024-01-07 ENCOUNTER — Telehealth: Payer: Self-pay | Admitting: Pharmacist

## 2024-01-07 DIAGNOSIS — E1069 Type 1 diabetes mellitus with other specified complication: Secondary | ICD-10-CM

## 2024-01-07 LAB — MICROALBUMIN / CREATININE URINE RATIO
Creatinine, Urine: 67.9 mg/dL
Microalb/Creat Ratio: 6 mg/g{creat} (ref 0–29)
Microalbumin, Urine: 3.9 ug/mL

## 2024-01-07 NOTE — Telephone Encounter (Addendum)
      Spoke with patient regarding insulin  regimen for Type 1 diabetes.  She is currently using the Libre 3 CGM.  She is recovering well from recent dental procedures   Past Insulin  regimen: Insulin  NPH 15 units this AM Insulin  Regular 20 units lunch  BG is currently 321   CURRENT INSULIN  REGIMEN Tresiba  34 units daily Humalog  5-8 units 3 times daily with meals She has been on a similar basal/bolus regimen in the past  Patient reports hypoglycemia has improved and she is pleased with her progress   CGM data: Avg 14 day 152 >250 38% 70-180 61% Below 1% 54-69 Encouraged 3 steady healthy plate meals     Discussed following up with endo Hulon Magic, FNP) at [facility] endo; message sent to PCP.    Will see PCP at the end of April  Endo appt scheduled for 05/2024 Will f/u with patient in 1 month    Marvell Slider, PharmD, BCACP, CPP Clinical Pharmacist, [facility]

## 2024-01-08 ENCOUNTER — Telehealth: Payer: Self-pay

## 2024-01-08 NOTE — Progress Notes (Signed)
 Care Guide Pharmacy Note  01/08/2024 Name: [name] [name] MRN: [id number] DOB: Mar 02, 1971  Referred By: Tommie Sams, DO Reason for referral: Care Coordination (Outreach to schedule with Pharm d )   [name] is a 53 y.o. year old female who is a primary care patient of Tommie Sams, DO.  Jasiah R Bene was referred to the pharmacist for assistance related to: DMII  An unsuccessful telephone outreach was attempted today to contact the patient who was referred to the pharmacy team for assistance with medication management. Additional attempts will be made to contact the patient.  Penne Lash , RMA     [facility]  [facility], [facility] Guide  Direct Dial: [phone]  Website: [email or web address]

## 2024-01-10 ENCOUNTER — Telehealth: Payer: Self-pay | Admitting: *Deleted

## 2024-01-10 NOTE — Telephone Encounter (Signed)
 Copied from CRM [id number]. Topic: Clinical - Medication Question >> Jan 10, 2024 11:11 AM Shelah Lewandowsky wrote: Reason for CRM: Patient is out of insulin and is asking if Dr Adriana Simas was going to call in a new insulin before she buys the insulin regular (NOVOLIN R) 100 units/mL injection- please call [phone]

## 2024-01-14 ENCOUNTER — Telehealth: Payer: Self-pay | Admitting: Pharmacist

## 2024-01-14 DIAGNOSIS — E1069 Type 1 diabetes mellitus with other specified complication: Secondary | ICD-10-CM

## 2024-01-14 MED ORDER — PIP PEN NEEDLES 32G X 4MM 32G X 4 MM MISC: 11 refills | Status: AC

## 2024-01-14 MED ORDER — TRESIBA FLEXTOUCH 200 UNIT/ML ~~LOC~~ SOPN: 34.0000 [IU] | PEN_INJECTOR | Freq: Every day | SUBCUTANEOUS | 5 refills | Status: AC

## 2024-01-14 MED ORDER — INSULIN LISPRO (1 UNIT DIAL) 100 UNIT/ML (KWIKPEN): 5.0000 [IU] | PEN_INJECTOR | Freq: Three times a day (TID) | SUBCUTANEOUS | 11 refills | Status: DC

## 2024-01-14 NOTE — Telephone Encounter (Addendum)
    Spoke with patient (patient's daughter) regarding insulin regimen for Type 1 diabetes.  She is currently using the libre 3 CGM.  She recently had dental work done and is unable to talk about regimen thoroughly.  Current insulin regimen as prescribed: Insulin NPH 40 units every evening  Insulin Regular 15 units with meals Insulin regimen patient is giving today: Insulin NPH 15 units this AM Insulin Regular 20 units lunch  BG is currently 321   CGM data: Avg 14 day 160 <250 13% 70-180 58% Below 8% 54-69 3am bottoms out to 50 over night; education provided on compression lows vs true hypoglycemia  Recommended switching insulin to: Tresiba 34 units daily Humalog 5-8 units 3 times daily with meals She has been on a similar basal/bolus regimen in the past Experiencing overlap hypoglycemia from insulin regular and NPH   Discussed following up with endo [name] Magic, [name] [name]) at [name]; message sent to PCP.  Will follow up with patient next week.  [name] [name] Dattero [name], PharmD, BCACP, CPP Clinical Pharmacist, [facility]

## 2024-01-15 ENCOUNTER — Other Ambulatory Visit: Payer: Self-pay | Admitting: Family Medicine

## 2024-01-15 ENCOUNTER — Ambulatory Visit (HOSPITAL_COMMUNITY)

## 2024-01-15 DIAGNOSIS — E1069 Type 1 diabetes mellitus with other specified complication: Secondary | ICD-10-CM

## 2024-01-17 NOTE — Progress Notes (Signed)
 Care Guide Pharmacy Note  01/17/2024 Name: VINCIE LINN MRN: [id number] DOB: [birth year]  Referred By: Tommie Sams, DO Reason for referral: Care Coordination (Outreach to schedule with Pharm d )   [name] is a 53 y.o. year old female who is a primary care patient of Tommie Sams, DO.  Aradia R Gilliam was referred to the pharmacist for assistance related to: DMII  Successful contact was made with the patient to discuss pharmacy services.  Patient declines engagement at this time. Contact information was provided to the patient should they wish to reach out for assistance at a later time.  Penne Lash , RMA     [facility]  [facility], [facility] Guide  Direct Dial: [phone]  Website: [email or web address]

## 2024-01-21 ENCOUNTER — Telehealth: Payer: Self-pay | Admitting: Pharmacist

## 2024-01-26 ENCOUNTER — Encounter (INDEPENDENT_AMBULATORY_CARE_PROVIDER_SITE_OTHER): Payer: Self-pay | Admitting: Gastroenterology

## 2024-01-27 ENCOUNTER — Other Ambulatory Visit (INDEPENDENT_AMBULATORY_CARE_PROVIDER_SITE_OTHER): Payer: Self-pay | Admitting: Gastroenterology

## 2024-01-27 NOTE — Patient Instructions (Signed)
 [name] [name]  01/27/2024     @PREFPERIOPPHARMACY @   Your procedure is scheduled on  01/30/2024.   Report to [facility] at  0815  A.M.   Call this number if you have problems the morning of surgery:  [phone]  If you experience any cold or flu symptoms such as cough, fever, chills, shortness of breath, etc. between now and your scheduled surgery, please notify us  at the above number.   Remember:  Follow the diet and prep instructions given to you by the office.   You may drink clear liquids until  0615 am on 01/30/2024.    Clear liquids allowed are:                    Water, Juice (No red color; non-citric and without pulp; diabetics please choose diet or no sugar options), Carbonated beverages (diabetics please choose diet or no sugar options), Clear Tea (No creamer, milk, or cream, including half & half and powdered creamer), Black Coffee Only (No creamer, milk or cream, including half & half and powdered creamer), and Clear Sports drink (No red color; diabetics please choose diet or no sugar options)    Take these medicines the morning of surgery with A SIP OF WATER                    amitriptyline, oxycodone  (if needed), pantoprazole .    Do not wear jewelry, make-up or nail polish, including gel polish,  artificial nails, or any other type of covering on natural nails (fingers and  toes).  Do not wear lotions, powders, or perfumes, or deodorant.  Do not shave 48 hours prior to surgery.  Men may shave face and neck.  Do not bring valuables to the hospital.  [facility] is not responsible for any belongings or valuables.  Contacts, dentures or bridgework may not be worn into surgery.  Leave your suitcase in the car.  After surgery it may be brought to your room.  For patients admitted to the hospital, discharge time will be determined by your treatment team.  Patients discharged the day of surgery will not be allowed to drive home and must have someone with them  for 24 hours.    Special instructions:  DO NOT smoke tobacco or vape for 24 hours before your procedure.  Please read over the following fact sheets that you were given. Anesthesia Post-op Instructions and Care and Recovery After Surgery      Colonoscopy, Adult, Care After The following information offers guidance on how to care for yourself after your procedure. Your health care provider may also give you more specific instructions. If you have problems or questions, contact your health care provider. What can I expect after the procedure? After the procedure, it is common to have: A small amount of blood in your stool for 24 hours after the procedure. Some gas. Mild cramping or bloating of your abdomen. Follow these instructions at home: Eating and drinking  Drink enough fluid to keep your urine pale yellow. Follow instructions from your health care provider about eating or drinking restrictions. Resume your normal diet as told by your health care provider. Avoid heavy or fried foods that are hard to digest. Activity Rest as told by your health care provider. Avoid sitting for a long time without moving. Get up to take short walks every 1-2 hours. This is important to improve blood flow and breathing. Ask for  help if you feel weak or unsteady. Return to your normal activities as told by your health care provider. Ask your health care provider what activities are safe for you. Managing cramping and bloating  Try walking around when you have cramps or feel bloated. If directed, apply heat to your abdomen as told by your health care provider. Use the heat source that your health care provider recommends, such as a moist heat pack or a heating pad. Place a towel between your skin and the heat source. Leave the heat on for 20-30 minutes. Remove the heat if your skin turns bright red. This is especially important if you are unable to feel pain, heat, or cold. You have a greater risk of  getting burned. General instructions If you were given a sedative during the procedure, it can affect you for several hours. Do not drive or operate machinery until your health care provider says that it is safe. For the first 24 hours after the procedure: Do not sign important documents. Do not drink alcohol . Do your regular daily activities at a slower pace than normal. Eat soft foods that are easy to digest. Take over-the-counter and prescription medicines only as told by your health care provider. Keep all follow-up visits. This is important. Contact a health care provider if: You have blood in your stool 2-3 days after the procedure. Get help right away if: You have more than a small spotting of blood in your stool. You have large blood clots in your stool. You have swelling of your abdomen. You have nausea or vomiting. You have a fever. You have increasing pain in your abdomen that is not relieved with medicine. These symptoms may be an emergency. Get help right away. Call 911. Do not wait to see if the symptoms will go away. Do not drive yourself to the hospital. Summary After the procedure, it is common to have a small amount of blood in your stool. You may also have mild cramping and bloating of your abdomen. If you were given a sedative during the procedure, it can affect you for several hours. Do not drive or operate machinery until your health care provider says that it is safe. Get help right away if you have a lot of blood in your stool, nausea or vomiting, a fever, or increased pain in your abdomen. This information is not intended to replace advice given to you by your health care provider. Make sure you discuss any questions you have with your health care provider. Document Revised: 11/06/2022 Document Reviewed: 05/17/2021 Elsevier Patient Education  2024 Elsevier [employer].General Anesthesia, Adult, Care After The following information offers guidance on how to care for  yourself after your procedure. Your health care provider may also give you more specific instructions. If you have problems or questions, contact your health care provider. What can I expect after the procedure? After the procedure, it is common for people to: Have pain or discomfort at the IV site. Have nausea or vomiting. Have a sore throat or hoarseness. Have trouble concentrating. Feel cold or chills. Feel weak, sleepy, or tired (fatigue). Have soreness and body aches. These can affect parts of the body that were not involved in surgery. Follow these instructions at home: For the time period you were told by your health care provider:  Rest. Do not participate in activities where you could fall or become injured. Do not drive or use machinery. Do not drink alcohol . Do not take sleeping pills or medicines that  cause drowsiness. Do not make important decisions or sign legal documents. Do not take care of children on your own. General instructions Drink enough fluid to keep your urine pale yellow. If you have sleep apnea, surgery and certain medicines can increase your risk for breathing problems. Follow instructions from your health care provider about wearing your sleep device: Anytime you are sleeping, including during daytime naps. While taking prescription pain medicines, sleeping medicines, or medicines that make you drowsy. Return to your normal activities as told by your health care provider. Ask your health care provider what activities are safe for you. Take over-the-counter and prescription medicines only as told by your health care provider. Do not use any products that contain nicotine or tobacco. These products include cigarettes, chewing tobacco, and vaping devices, such as e-cigarettes. These can delay incision healing after surgery. If you need help quitting, ask your health care provider. Contact a health care provider if: You have nausea or vomiting that does not get  better with medicine. You vomit every time you eat or drink. You have pain that does not get better with medicine. You cannot urinate or have bloody urine. You develop a skin rash. You have a fever. Get help right away if: You have trouble breathing. You have chest pain. You vomit blood. These symptoms may be an emergency. Get help right away. Call 911. Do not wait to see if the symptoms will go away. Do not drive yourself to the hospital. Summary After the procedure, it is common to have a sore throat, hoarseness, nausea, vomiting, or to feel weak, sleepy, or fatigue. For the time period you were told by your health care provider, do not drive or use machinery. Get help right away if you have difficulty breathing, have chest pain, or vomit blood. These symptoms may be an emergency. This information is not intended to replace advice given to you by your health care provider. Make sure you discuss any questions you have with your health care provider. Document Revised: 12/22/2021 Document Reviewed: 12/22/2021 Elsevier Patient Education  2024 [employer].

## 2024-01-28 ENCOUNTER — Encounter (HOSPITAL_COMMUNITY)
Admission: RE | Admit: 2024-01-28 | Discharge: 2024-01-28 | Disposition: A | Discharge status: Home / Self Care | Source: Ambulatory Visit | Attending: Gastroenterology | Admitting: Gastroenterology

## 2024-01-28 ENCOUNTER — Encounter (HOSPITAL_COMMUNITY): Payer: Self-pay

## 2024-01-28 VITALS — BP 117/76 | HR 83 | Temp 98.0°F | Resp 18 | Ht 63.0 in | Wt 130.1 lb

## 2024-01-28 DIAGNOSIS — Z01810 Encounter for preprocedural cardiovascular examination: Secondary | ICD-10-CM | POA: Insufficient documentation

## 2024-01-28 DIAGNOSIS — E1069 Type 1 diabetes mellitus with other specified complication: Secondary | ICD-10-CM | POA: Insufficient documentation

## 2024-01-28 HISTORY — DX: Nausea with vomiting, unspecified: Z98.890

## 2024-01-28 HISTORY — DX: Other specified postprocedural states: R11.2

## 2024-01-28 HISTORY — DX: Type 1 diabetes mellitus without complications: E10.9

## 2024-01-30 ENCOUNTER — Encounter (HOSPITAL_COMMUNITY)
Admission: RE | Disposition: A | Discharge status: Home / Self Care | Payer: Self-pay | Source: Home / Self Care | Attending: Gastroenterology

## 2024-01-30 ENCOUNTER — Ambulatory Visit (HOSPITAL_COMMUNITY): Admitting: Certified Registered"

## 2024-01-30 ENCOUNTER — Ambulatory Visit (HOSPITAL_BASED_OUTPATIENT_CLINIC_OR_DEPARTMENT_OTHER): Admitting: Certified Registered"

## 2024-01-30 ENCOUNTER — Ambulatory Visit (HOSPITAL_COMMUNITY)
Admission: RE | Admit: 2024-01-30 | Discharge: 2024-01-30 | Disposition: A | Discharge status: Home / Self Care | Attending: Gastroenterology | Admitting: Gastroenterology

## 2024-01-30 ENCOUNTER — Encounter (HOSPITAL_COMMUNITY): Payer: Self-pay | Admitting: Gastroenterology

## 2024-01-30 DIAGNOSIS — F32A Depression, unspecified: Secondary | ICD-10-CM | POA: Diagnosis not present

## 2024-01-30 DIAGNOSIS — I10 Essential (primary) hypertension: Secondary | ICD-10-CM

## 2024-01-30 DIAGNOSIS — K219 Gastro-esophageal reflux disease without esophagitis: Secondary | ICD-10-CM | POA: Diagnosis not present

## 2024-01-30 DIAGNOSIS — Z794 Long term (current) use of insulin: Secondary | ICD-10-CM | POA: Diagnosis not present

## 2024-01-30 DIAGNOSIS — Z1211 Encounter for screening for malignant neoplasm of colon: Secondary | ICD-10-CM

## 2024-01-30 DIAGNOSIS — F418 Other specified anxiety disorders: Secondary | ICD-10-CM | POA: Diagnosis not present

## 2024-01-30 DIAGNOSIS — F419 Anxiety disorder, unspecified: Secondary | ICD-10-CM | POA: Diagnosis not present

## 2024-01-30 DIAGNOSIS — K648 Other hemorrhoids: Secondary | ICD-10-CM | POA: Diagnosis not present

## 2024-01-30 DIAGNOSIS — K640 First degree hemorrhoids: Secondary | ICD-10-CM

## 2024-01-30 DIAGNOSIS — Q438 Other specified congenital malformations of intestine: Secondary | ICD-10-CM

## 2024-01-30 DIAGNOSIS — E119 Type 2 diabetes mellitus without complications: Secondary | ICD-10-CM | POA: Diagnosis not present

## 2024-01-30 DIAGNOSIS — E1069 Type 1 diabetes mellitus with other specified complication: Secondary | ICD-10-CM

## 2024-01-30 HISTORY — PX: COLONOSCOPY: SHX5424

## 2024-01-30 LAB — GLUCOSE, CAPILLARY: Glucose-Capillary: 85 mg/dL (ref 70–99)

## 2024-01-30 SURGERY — COLONOSCOPY
Anesthesia: General

## 2024-01-30 MED ORDER — LUBIPROSTONE 24 MCG PO CAPS: 24.0000 ug | ORAL_CAPSULE | Freq: Two times a day (BID) | ORAL | 5 refills | Status: DC

## 2024-01-30 MED ORDER — LIDOCAINE 2% (20 MG/ML) 5 ML SYRINGE
INTRAMUSCULAR | Status: DC | PRN
  Administered 2024-01-30: 100 mg via INTRAVENOUS

## 2024-01-30 MED ORDER — PHENYLEPHRINE 80 MCG/ML (10ML) SYRINGE FOR IV PUSH (FOR BLOOD PRESSURE SUPPORT)
PREFILLED_SYRINGE | INTRAVENOUS | Status: DC | PRN
  Administered 2024-01-30: 80 ug via INTRAVENOUS
  Administered 2024-01-30: 160 ug via INTRAVENOUS

## 2024-01-30 MED ORDER — PROPOFOL 500 MG/50ML IV EMUL
INTRAVENOUS | Status: AC
  Filled 2024-01-30: qty 100

## 2024-01-30 MED ORDER — LACTATED RINGERS IV SOLN
INTRAVENOUS | Status: DC | PRN
Start: 2024-01-30 — End: 2024-01-30

## 2024-01-30 MED ORDER — PHENYLEPHRINE 80 MCG/ML (10ML) SYRINGE FOR IV PUSH (FOR BLOOD PRESSURE SUPPORT)
PREFILLED_SYRINGE | INTRAVENOUS | Status: AC
  Filled 2024-01-30: qty 10

## 2024-01-30 MED ORDER — LACTATED RINGERS IV SOLN: INTRAVENOUS | Status: DC

## 2024-01-30 MED ORDER — HEPARIN SOD (PORK) LOCK FLUSH 100 UNIT/ML IV SOLN
500.0000 [IU] | INTRAVENOUS | Status: AC | PRN
  Administered 2024-01-30: 500 [IU]

## 2024-01-30 MED ORDER — PROPOFOL 10 MG/ML IV BOLUS
INTRAVENOUS | Status: DC | PRN
  Administered 2024-01-30: 100 mg via INTRAVENOUS

## 2024-01-30 MED ORDER — PROPOFOL 500 MG/50ML IV EMUL
INTRAVENOUS | Status: DC | PRN
  Administered 2024-01-30: 250 ug/kg/min via INTRAVENOUS

## 2024-01-30 MED ORDER — SODIUM CHLORIDE 0.9% FLUSH: 10.0000 mL | INTRAVENOUS | Status: DC | PRN

## 2024-01-30 MED ORDER — HEPARIN SOD (PORK) LOCK FLUSH 100 UNIT/ML IV SOLN
INTRAVENOUS | Status: AC
  Filled 2024-01-30: qty 5

## 2024-01-30 NOTE — Anesthesia Postprocedure Evaluation (Signed)
 Anesthesia Post Note  Patient: [name] [name]  Procedure(s) Performed: COLONOSCOPY  Patient location during evaluation: Phase II Anesthesia Type: General Level of consciousness: awake Pain management: pain level controlled Vital Signs Assessment: post-procedure vital signs reviewed and stable Respiratory status: spontaneous breathing and respiratory function stable Cardiovascular status: blood pressure returned to baseline and stable Postop Assessment: no headache and no apparent nausea or vomiting Anesthetic complications: no Comments: Late entry   No notable events documented.   Last Vitals:  Vitals:   01/30/24 0825 01/30/24 1001  BP: 118/74 117/67  Pulse: 76 69  Resp: 17 15  Temp: 36.8 C 36.4 C  SpO2: 99% 99%    Last Pain:  Vitals:   01/30/24 1001  TempSrc: Oral  PainSc: 0-No pain                 Coretha Dew

## 2024-01-30 NOTE — Transfer of Care (Signed)
 Immediate Anesthesia Transfer of Care Note  Patient: [name] [name]  Procedure(s) Performed: COLONOSCOPY  Patient Location: Short Stay  Anesthesia Type:General  Level of Consciousness: awake, alert , and oriented  Airway & Oxygen  Therapy: Patient Spontanous Breathing  Post-op Assessment: Report given to RN and Post -op Vital signs reviewed and stable  Post vital signs: Reviewed and stable  Last Vitals:  Vitals Value Taken Time  BP    Temp    Pulse    Resp    SpO2      Last Pain:  Vitals:   01/30/24 0902  TempSrc:   PainSc: 6          Complications: No notable events documented.

## 2024-01-30 NOTE — Op Note (Signed)
 [facility] Patient Name: [name] [name] Procedure Date: 01/30/2024 8:49 AM MRN: [id number] Date of Birth: 1971-02-27 Attending MD: Terril Fetters , MD, [id number] CSN: [id number] Age: 53 Admit Type: Outpatient Procedure:                Colonoscopy Indications:              Screening for colorectal malignant neoplasm Providers:                Terril Fetters, MD, Zarinah Page, Sharlette Dayhoff                            Technician, Technician Referring MD:              Medicines:                Monitored Anesthesia Care Complications:            No immediate complications. Estimated Blood Loss:     Estimated blood loss: none. Procedure:                Pre-Anesthesia Assessment:                           - Prior to the procedure, a History and Physical                            was performed, and patient medications and                            allergies were reviewed. The patient's tolerance of                            previous anesthesia was also reviewed. The risks                            and benefits of the procedure and the sedation                            options and risks were discussed with the patient.                            All questions were answered, and informed consent                            was obtained. Prior Anticoagulants: The patient has                            taken no anticoagulant or antiplatelet agents. ASA                            Grade Assessment: III - A patient with severe                            systemic disease. After reviewing the risks and  benefits, the patient was deemed in satisfactory                            condition to undergo the procedure.                           After obtaining informed consent, the colonoscope                            was passed under direct vision. Throughout the                            procedure, the patient's blood pressure, pulse, and                             oxygen  saturations were monitored continuously. The                            [id number]-HQ190L ([id number]) scope was introduced through                            the anus and advanced to the the cecum, identified                            by the appendiceal orifice. The colonoscopy was                            technically difficult and complex due to inadequate                            bowel prep, significant looping and a tortuous                            colon. Successful completion of the procedure was                            aided by applying abdominal pressure. The patient                            tolerated the procedure well. The quality of the                            bowel preparation was evaluated using the BBPS                            [facility] Bowel Preparation Scale) with scores of:                            Right Colon = 1 (portion of mucosa seen, but other                            areas not well seen due to staining, residual stool  and/or opaque liquid), Transverse Colon = 1                            (portion of mucosa seen, but other areas not well                            seen due to staining, residual stool and/or opaque                            liquid) and Left Colon = 2 (minor amount of                            residual staining, small fragments of stool and/or                            opaque liquid, but mucosa seen well). The total                            BBPS score equals 4. The ileocecal valve,                            appendiceal orifice, and rectum were photographed. Scope In: 9:06:34 AM Scope Out: 9:56:54 AM Scope Withdrawal Time: 0 hours 39 minutes 33 seconds  Total Procedure Duration: 0 hours 50 minutes 20 seconds  Findings:      The perianal and digital rectal examinations were normal.      Copious quantities of stool was found in the entire colon, precluding       visualization. Lavage of the area was  performed using a large amount of       sterile water, resulting in incomplete clearance with fair visualization.      Non-bleeding internal hemorrhoids were found during retroflexion. The       hemorrhoids were small. Impression:               - Stool in the entire examined colon.                           - No specimens collected. Moderate Sedation:      Per Anesthesia Care Recommendation:           - Patient has a contact number available for                            emergencies. The signs and symptoms of potential                            delayed complications were discussed with the                            patient. Return to normal activities tomorrow.                            Written discharge instructions were provided to the  patient.                           - Resume previous diet.                           - Continue present medications.                           - Repeat colonoscopy in 6 months because the bowel                            preparation was poor. Sutab  with extended bowel prep                           - Return to GI clinic as previously scheduled.                           -Start Amitiza  Procedure Code(s):        --- Professional ---                           J1914, Colorectal cancer screening; colonoscopy on                            individual not meeting criteria for high risk Diagnosis Code(s):        --- Professional ---                           Z12.11, Encounter for screening for malignant                            neoplasm of colon CPT copyright 2022 American Medical Association. All rights reserved. The codes documented in this report are preliminary and upon coder review may  be revised to meet current compliance requirements. Terril Fetters, MD Terril Fetters, MD 01/30/2024 10:02:19 AM This report has been signed electronically. Number of Addenda: 0

## 2024-01-30 NOTE — Anesthesia Preprocedure Evaluation (Signed)
 Anesthesia Evaluation  Patient identified by MRN, date of birth, ID band Patient awake    Reviewed: Allergy & Precautions, H&P , NPO status , Patient's Chart, lab work & pertinent test results, reviewed documented beta blocker date and time   History of Anesthesia Complications (+) PONV and history of anesthetic complications  Airway Mallampati: II  TM Distance: >3 FB Neck ROM: full    Dental no notable dental hx.    Pulmonary neg pulmonary ROS   Pulmonary exam normal breath sounds clear to auscultation       Cardiovascular Exercise Tolerance: Good hypertension, negative cardio ROS  Rhythm:regular Rate:Normal     Neuro/Psych  Headaches PSYCHIATRIC DISORDERS Anxiety Depression    negative neurological ROS  negative psych ROS   GI/Hepatic negative GI ROS, Neg liver ROS,GERD  ,,  Endo/Other  negative endocrine ROSdiabetes    Renal/GU negative Renal ROS  negative genitourinary   Musculoskeletal   Abdominal   Peds  Hematology negative hematology ROS (+)   Anesthesia Other Findings   Reproductive/Obstetrics negative OB ROS                             Anesthesia Physical Anesthesia Plan  ASA: 3  Anesthesia Plan: General   Post-op Pain Management:    Induction:   PONV Risk Score and Plan: Propofol  infusion  Airway Management Planned:   Additional Equipment:   Intra-op Plan:   Post-operative Plan:   Informed Consent: I have reviewed the patients History and Physical, chart, labs and discussed the procedure including the risks, benefits and alternatives for the proposed anesthesia with the patient or authorized representative who has indicated his/her understanding and acceptance.     Dental Advisory Given  Plan Discussed with: CRNA  Anesthesia Plan Comments:        Anesthesia Quick Evaluation

## 2024-01-30 NOTE — Discharge Instructions (Signed)

## 2024-01-30 NOTE — H&P (Signed)
 Primary Care Physician:  Cook, Jayce G, DO Primary Gastroenterologist:  Dr. Alita Irwin  Pre-Procedure History & Physical: HPI:  [name] [name] is a 53 y.o. female with type 1 diabetes, chronic pancreatitis status post whipple 2011, ventral hernia containing transverse colon who presents for evaluation of colorectal cancer screening. Patient had poor prep last year and now here for repeat with extended bowel prep   Past Medical History:  Diagnosis Date   Acute bacterial bronchitis    dx 12-06-2014  --  FINISHED Z-PAC-  NO FEVER BUT STILL INTERMITTANT  PRODUCTIVE COUGH   Anxiety    Chronic pain    has pain specialist   Diabetes mellitus type I (HCC)    GERD (gastroesophageal reflux disease)    History of chronic gastritis    History of chronic pancreatitis    multple recurrent--  s/p  whipple 2011   History of esophageal ulcer    History of kidney stones    History of subarachnoid hemorrhage    01-08-2013--  fell on concrete--  resolved without surgical intervention   History of suicide attempt    hx multiple attempts by overdosing of meds   IBS (irritable bowel syndrome)    Left ureteral calculus    Major depression, recurrent, chronic (HCC)    w/  Hx  multiple suicide attempts (overdose)   Migraine headache    PONV (postoperative nausea and vomiting)    Type 1 diabetes mellitus (HCC)    Whipple disease 2011    Past Surgical History:  Procedure Laterality Date   CESAREAN SECTION  x2  last one 02-29-2000   last one w/ BILATERAL TUBAL LIGATION   COLONOSCOPY  03-14-2009   dx ileocolonscopy   CYSTOSCOPY WITH RETROGRADE PYELOGRAM, URETEROSCOPY AND STENT PLACEMENT Left 12/20/2014   Procedure: CYSTOSCOPY, LEFT URETEROSCOPY, STONE REMOVAL, LASER LITHOTRIPSY;  Surgeon: Ann Barnacle, MD;  Location: [facility];  Service: Urology;  Laterality: Left;   ESOPHAGOGASTRODUODENOSCOPY  last  one 12-31-2008   ESOPHAGOGASTRODUODENOSCOPY (EGD) WITH PROPOFOL  N/A 05/28/2017   Procedure:  ESOPHAGOGASTRODUODENOSCOPY (EGD) WITH PROPOFOL ;  Surgeon: Ruby Corporal, MD;  [facility];  Service: Endoscopy;  Laterality: N/A;  2:00   FLEXIBLE SIGMOIDOSCOPY  06/13/2023   Procedure: FLEXIBLE SIGMOIDOSCOPY;  Surgeon: Hargis Lias, MD;  Location: [facility];  Service: Endoscopy;;   HOLMIUM LASER APPLICATION Left 12/20/2014   Procedure: HOLMIUM LASER APPLICATION;  Surgeon: Ann Barnacle, MD;  Location: [facility];  Service: Urology;  Laterality: Left;   LAPAROSCOPIC ASSISTED VAGINAL HYSTERECTOMY  12-16-2006   LAPAROSCOPIC NISSEN FUNDOPLICATION  Nov 2001   w/  CHOLECYSTECTOMY   PORTACATH PLACEMENT N/A 07/12/2017   Procedure: INSERTION PORT-A-CATH;  Surgeon: Alanda Allegra, MD;  Location: [facility];  Service: General;  Laterality: N/A;   REPAIR PERFORATED VISCUS  POST WHIPPLE  x5    last one 06-23-2012   TUBAL LIGATION     WHIPPLE PROCEDURE  2011   at [facility]    Prior to Admission medications   Medication Sig Start Date End Date Taking? Authorizing Provider  Accu-Chek FastClix Lancets MISC Use to monitor glucose 4 times daily 08/15/21  Yes Nida, Gebreselassie W, MD  amitriptyline (ELAVIL) 25 MG tablet Take 25 mg by mouth daily. 05/14/23  Yes [provider]  Continuous Glucose Sensor (FREESTYLE LIBRE 3 SENSOR) MISC Place 1 sensor on the skin every 14 days. Use to check glucose continuously 01/06/24  Yes Cook, Jayce G, DO  glucose blood (ACCU-CHEK GUIDE TEST)  test strip Use up to 4 times daily to check blood sugar. 01/06/24  Yes Cook, Jayce G, DO  insulin  degludec (TRESIBA  FLEXTOUCH) 200 UNIT/ML FlexTouch Pen Inject 34 Units into the skin at bedtime. Stop Novolin N 01/14/24  Yes Cook, Jayce G, DO  insulin  lispro (HUMALOG  KWIKPEN) 100 UNIT/ML KwikPen Inject 5-8 Units into the skin 3 (three) times daily before meals. Stop Insulin  REGULAR Patient taking differently: Inject 15 Units into the skin 3 (three) times daily before meals. Stop Insulin  REGULAR 01/14/24  Yes Cook,  Jayce G, DO  Insulin  Pen Needle (PIP PEN NEEDLES 32G X ) 32G X 4 MM MISC Use to inject insulin  4 times daily as directed. DX; E10.9 01/14/24  Yes Cook, Jayce G, DO  oxyCODONE  (OXY IR/ROXICODONE ) 5 MG immediate release tablet Take 1 tablet by mouth 3 (three) times daily. 07/04/18  Yes [provider]  Pancrelipase , Lip-Prot-Amyl, (ZENPEP ) 40000-126000 units CPEP Take one every 4 hours 12/16/23  Yes Dwane Andres, Forbes Ida, MD  pantoprazole  (PROTONIX ) 40 MG tablet TAKE ONE TABLET BY MOUTH ONCE DAILY. 08/18/18  Yes Setzer, Terri L, NP  promethazine  (PHENERGAN ) 50 MG tablet TAKE 1 TABLET BY MOUTH EVERY 8 HOURS AS NEEDED FOR NAUSEA OR VOMITING. 05/22/23  Yes Morio Widen, Forbes Ida, MD  Prucalopride Succinate  2 MG TABS Take 1 tablet (2 mg total) by mouth daily. 12/16/23 03/15/24 Yes Rosetta Rupnow, Forbes Ida, MD  SUTAB  [phone] MG TABS USE AS DIRECTED 01/27/24  Yes Ebrahim Deremer, Forbes Ida, MD  zolpidem  (AMBIEN ) 5 MG tablet Take 1 tablet (5 mg total) by mouth at bedtime as needed. for sleep 01/13/24  Yes Debrah Fan, Jayce G, DO  glucose blood (ACCU-CHEK GUIDE) test strip Use as instructed 08/15/21   Nida, Gebreselassie W, MD  naloxone [facility]) nasal spray 4 mg/0.1 mL SMARTSIG:Both Nares 06/14/23   [provider]    Allergies as of 12/18/2023 - Review Complete 12/16/2023  Allergen Reaction Noted   Buspar [buspirone] Other (See Comments) 01/21/2013   Metoclopramide hcl Other (See Comments)     Family History  Problem Relation Age of Onset   Hypertension Mother    Hyperlipidemia Mother    Diabetes Father    Stroke Father     Social History   Socioeconomic History   Marital status: Legally Separated    Spouse name: Not on file   Number of children: Not on file   Years of education: Not on file   Highest education level: Some college, no degree  Occupational History   Not on file  Tobacco Use   Smoking status: Never   Smokeless tobacco: Never  Vaping Use   Vaping status: Never Used  Substance and Sexual  Activity   Alcohol  use: No   Drug use: Not Currently   Sexual activity: Yes    Birth control/protection: Surgical  Other Topics Concern   Not on file  Social History Narrative   Not on file   Social Drivers of Health   Financial Resource Strain: Medium Risk (07/08/2023)   Overall Financial Resource Strain (CARDIA)    Difficulty of Paying Living Expenses: Somewhat hard  Food Insecurity: No Food Insecurity (07/08/2023)   Hunger Vital Sign    Worried About Running Out of Food in the Last Year: Never true    Ran Out of Food in the Last Year: Never true  Transportation Needs: No Transportation Needs (07/08/2023)   PRAPARE - Administrator, Civil Service (Medical): No    Lack of Transportation (Non-Medical): No  Physical Activity: Insufficiently Active (07/08/2023)   Exercise Vital Sign    Days of Exercise per Week: 3 days    Minutes of Exercise per Session: 30 min  Stress: No Stress Concern Present (07/08/2023)   [employer] of Occupational Health - Occupational Stress Questionnaire    Feeling of Stress : Only a little  Social Connections: Moderately Integrated (07/08/2023)   Social Connection and Isolation Panel [NHANES]    Frequency of Communication with Friends and Family: More than three times a week    Frequency of Social Gatherings with Friends and Family: Once a week    Attends Religious Services: More than 4 times per year    Active Member of [employer] or Organizations: No    Attends Banker Meetings: Never    Marital Status: Married  Recent Concern: Social Connections - Moderately Isolated (05/31/2023)   Social Connection and Isolation Panel [NHANES]    Frequency of Communication with Friends and Family: More than three times a week    Frequency of Social Gatherings with Friends and Family: More than three times a week    Attends Religious Services: Never    Database administrator or Organizations: No    Attends Banker Meetings: Never     Marital Status: Married  Catering manager Violence: Not At Risk (05/31/2023)   Humiliation, Afraid, Rape, and Kick questionnaire    Fear of Current or Ex-Partner: No    Emotionally Abused: No    Physically Abused: No    Sexually Abused: No    Review of Systems: See HPI, otherwise negative ROS  Physical Exam: Vital signs in last 24 hours: Temp:  [98.3 F (36.8 C)] 98.3 F (36.8 C) (04/24 0825) Pulse Rate:  [76] 76 (04/24 0825) Resp:  [17] 17 (04/24 0825) BP: (118)/(74) 118/74 (04/24 0825) SpO2:  [99 %] 99 % (04/24 0825)   General:   Alert,  Well-developed, well-nourished, pleasant and cooperative in NAD Head:  Normocephalic and atraumatic. Eyes:  Sclera clear, no icterus.   Conjunctiva pink. Ears:  Normal auditory acuity. Nose:  No deformity, discharge,  or lesions. Msk:  Symmetrical without gross deformities. Normal posture. Extremities:  Without clubbing or edema. Neurologic:  Alert and  oriented x4;  grossly normal neurologically. Skin:  Intact without significant lesions or rashes. Psych:  Alert and cooperative. Normal mood and affect.  Impression/Plan: [name] [name] is here for a colonoscopy to be performed for colon cancer screening purposes.  The risks of the procedure including infection, bleed, or perforation as well as benefits, limitations, alternatives and imponderables have been reviewed with the patient. Questions have been answered. All parties agreeable.

## 2024-01-30 NOTE — Anesthesia Procedure Notes (Addendum)
 Date/Time: 01/30/2024 9:02 AM  Performed by: Sherwin Donate, CRNAPre-anesthesia Checklist: Patient identified, Emergency Drugs available, Suction available and Patient being monitored Patient Re-evaluated:Patient Re-evaluated prior to induction Oxygen  Delivery Method: Nasal cannula Induction Type: IV induction Placement Confirmation: positive ETCO2

## 2024-01-31 ENCOUNTER — Encounter (HOSPITAL_COMMUNITY): Payer: Self-pay | Admitting: Gastroenterology

## 2024-01-31 ENCOUNTER — Ambulatory Visit (HOSPITAL_COMMUNITY)

## 2024-02-05 ENCOUNTER — Ambulatory Visit (INDEPENDENT_AMBULATORY_CARE_PROVIDER_SITE_OTHER): Admitting: Family Medicine

## 2024-02-05 VITALS — BP 118/66 | HR 79 | Temp 97.7°F | Ht 63.0 in | Wt 130.0 lb

## 2024-02-05 DIAGNOSIS — Z794 Long term (current) use of insulin: Secondary | ICD-10-CM

## 2024-02-05 DIAGNOSIS — E1069 Type 1 diabetes mellitus with other specified complication: Secondary | ICD-10-CM

## 2024-02-05 NOTE — Patient Instructions (Signed)
 You're doing well.  Follow up in 3 months.

## 2024-02-06 NOTE — Assessment & Plan Note (Signed)
 Control was improving.  Continue current dosing of Tresiba  and Humalog .

## 2024-02-06 NOTE — Progress Notes (Signed)
 Subjective:  Patient ID: [name] [name], female    DOB: 09/02/1971  Age: 53 y.o. MRN: [id number]  CC: Follow-up   HPI:  53 year old female with type 1 diabetes, diabetic gastroparesis, drug-induced constipation, pancreatic insufficiency presents for follow-up.  Patient switched to Tresiba  and Humalog .  Her glycemic control has improved.  Recently, her average blood sugars have been around 160.  Overall she feels like she is doing well.  She has an upcoming appointment with endocrinology.  Patient pleased with her progress.  Patient Active Problem List   Diagnosis Date Noted   Grade I hemorrhoids 01/30/2024   Diabetic gastroparesis (HCC) 05/17/2023   Port-A-Cath in place 01/02/2023   Pancreatic insufficiency 08/15/2021   Insomnia 06/28/2014   Type 1 diabetes mellitus with other specified complication (HCC) 01/22/2013   Drug-induced constipation 05/13/2012   Esophageal reflux 02/03/2009    Social Hx   Social History   Socioeconomic History   Marital status: Legally Separated    Spouse name: Not on file   Number of children: Not on file   Years of education: Not on file   Highest education level: Some college, no degree  Occupational History   Not on file  Tobacco Use   Smoking status: Never   Smokeless tobacco: Never  Vaping Use   Vaping status: Never Used  Substance and Sexual Activity   Alcohol  use: No   Drug use: Not Currently   Sexual activity: Yes    Birth control/protection: Surgical  Other Topics Concern   Not on file  Social History Narrative   Not on file   Social Drivers of Health   Financial Resource Strain: Medium Risk (07/08/2023)   Overall Financial Resource Strain (CARDIA)    Difficulty of Paying Living Expenses: Somewhat hard  Food Insecurity: No Food Insecurity (07/08/2023)   Hunger Vital Sign    Worried About Running Out of Food in the Last Year: Never true    Ran Out of Food in the Last Year: Never true  Transportation Needs: No  Transportation Needs (07/08/2023)   PRAPARE - Administrator, Civil Service (Medical): No    Lack of Transportation (Non-Medical): No  Physical Activity: Insufficiently Active (07/08/2023)   Exercise Vital Sign    Days of Exercise per Week: 3 days    Minutes of Exercise per Session: 30 min  Stress: No Stress Concern Present (07/08/2023)   [employer] of Occupational Health - Occupational Stress Questionnaire    Feeling of Stress : Only a little  Social Connections: Moderately Integrated (07/08/2023)   Social Connection and Isolation Panel [NHANES]    Frequency of Communication with Friends and Family: More than three times a week    Frequency of Social Gatherings with Friends and Family: Once a week    Attends Religious Services: More than 4 times per year    Active Member of [employer] or Organizations: No    Attends Banker Meetings: Never    Marital Status: Married  Recent Concern: Social Connections - Moderately Isolated (05/31/2023)   Social Connection and Isolation Panel [NHANES]    Frequency of Communication with Friends and Family: More than three times a week    Frequency of Social Gatherings with Friends and Family: More than three times a week    Attends Religious Services: Never    Database administrator or Organizations: No    Attends Banker Meetings: Never    Marital Status: Married    Review  of Systems Per HPI  Objective:  BP 118/66   Pulse 79   Temp 97.7 F (36.5 C)   Ht 5\' 3"  (1.6 m)   Wt 130 lb (59 kg)   PF 98 L/min   BMI 23.03 kg/m      02/05/2024    1:51 PM 01/30/2024   10:01 AM 01/30/2024    8:25 AM  BP/Weight  Systolic BP 118 117 118  Diastolic BP 66 67 74  Wt. (Lbs) 130    BMI 23.03 kg/m2      Physical Exam Vitals and nursing note reviewed.  Constitutional:      General: She is not in acute distress.    Appearance: Normal appearance.  HENT:     Head: Normocephalic and atraumatic.  Cardiovascular:      Rate and Rhythm: Normal rate and regular rhythm.  Pulmonary:     Effort: Pulmonary effort is normal.     Breath sounds: Normal breath sounds. No wheezing, rhonchi or rales.  Neurological:     Mental Status: She is alert.  Psychiatric:        Mood and Affect: Mood normal.        Behavior: Behavior normal.     Lab Results  Component Value Date   WBC 5.4 12/27/2023   HGB 13.0 12/27/2023   HCT 38.9 12/27/2023   PLT 198 12/27/2023   GLUCOSE 183 (H) 12/27/2023   CHOL 84 12/27/2023   TRIG 74 12/27/2023   HDL 29 (L) 12/27/2023   LDLCALC 40 12/27/2023   ALT 18 12/27/2023   AST 18 12/27/2023   NA 139 12/27/2023   K 3.5 12/27/2023   CL 107 12/27/2023   CREATININE 0.62 12/27/2023   BUN 14 12/27/2023   CO2 23 12/27/2023   INR 1.14 01/08/2013   HGBA1C 9.9 (H) 12/27/2023     Assessment & Plan:  Type 1 diabetes mellitus with other specified complication [facility]) Assessment & Plan: Control was improving.  Continue current dosing of Tresiba  and Humalog .     Follow-up:  Return in about 3 months (around 05/06/2024).  Kathleen Papa DO [facility]

## 2024-02-13 NOTE — Telephone Encounter (Signed)
 Unsuccessful outreach to patient  Will reschedule for May 2025 Will see PCP on 02/05/24

## 2024-02-19 ENCOUNTER — Other Ambulatory Visit

## 2024-03-10 ENCOUNTER — Other Ambulatory Visit: Payer: Self-pay

## 2024-03-10 DIAGNOSIS — E1069 Type 1 diabetes mellitus with other specified complication: Secondary | ICD-10-CM

## 2024-03-10 MED ORDER — INSULIN LISPRO (1 UNIT DIAL) 100 UNIT/ML (KWIKPEN): 5.0000 [IU] | PEN_INJECTOR | Freq: Three times a day (TID) | SUBCUTANEOUS | 11 refills | Status: DC

## 2024-03-19 ENCOUNTER — Encounter: Payer: Self-pay | Admitting: Family Medicine

## 2024-03-20 ENCOUNTER — Other Ambulatory Visit: Payer: Self-pay | Admitting: Emergency Medicine

## 2024-03-20 ENCOUNTER — Encounter: Attending: Family Medicine | Admitting: Internal Medicine

## 2024-03-20 VITALS — BP 125/81 | HR 87 | Temp 98.0°F | Resp 16

## 2024-03-20 DIAGNOSIS — M129 Arthropathy, unspecified: Secondary | ICD-10-CM

## 2024-03-20 DIAGNOSIS — Z1231 Encounter for screening mammogram for malignant neoplasm of breast: Secondary | ICD-10-CM

## 2024-03-20 DIAGNOSIS — Z95828 Presence of other vascular implants and grafts: Secondary | ICD-10-CM | POA: Insufficient documentation

## 2024-03-20 DIAGNOSIS — Z452 Encounter for adjustment and management of vascular access device: Secondary | ICD-10-CM

## 2024-03-20 DIAGNOSIS — G8929 Other chronic pain: Secondary | ICD-10-CM

## 2024-03-20 MED ORDER — HEPARIN SOD (PORK) LOCK FLUSH 100 UNIT/ML IV SOLN
500.0000 [IU] | Freq: Once | INTRAVENOUS | Status: AC | PRN
  Administered 2024-03-20: 500 [IU]

## 2024-03-20 NOTE — Progress Notes (Signed)
 Please do not schedule a regular Lab appointment for this patient.  Patient's lab work will be drawn from their Port-a-Cath or PICC Line, and a [employer] with Lab appointment is required. Port had good blood return. Port flushed good.

## 2024-03-23 LAB — COMPREHENSIVE METABOLIC PANEL WITH GFR
AG Ratio: 1.5 (calc) (ref 1.0–2.5)
ALT: 18 U/L (ref 6–29)
AST: 16 U/L (ref 10–35)
Albumin: 3.8 g/dL (ref 3.6–5.1)
Alkaline phosphatase (APISO): 106 U/L (ref 37–153)
BUN: 15 mg/dL (ref 7–25)
CO2: 23 mmol/L (ref 20–32)
Calcium: 8.4 mg/dL — ABNORMAL LOW (ref 8.6–10.4)
Chloride: 109 mmol/L (ref 98–110)
Creat: 0.51 mg/dL (ref 0.50–1.03)
Globulin: 2.6 g/dL (ref 1.9–3.7)
Glucose, Bld: 197 mg/dL — ABNORMAL HIGH (ref 65–99)
Potassium: 4.4 mmol/L (ref 3.5–5.3)
Sodium: 139 mmol/L (ref 135–146)
Total Bilirubin: 0.4 mg/dL (ref 0.2–1.2)
Total Protein: 6.4 g/dL (ref 6.1–8.1)
eGFR: 112 mL/min/{1.73_m2} (ref >=60)

## 2024-03-23 LAB — CBC WITH DIFFERENTIAL/PLATELET
Absolute Lymphocytes: 2575 {cells}/uL (ref 850–3900)
Absolute Monocytes: 285 {cells}/uL (ref 200–950)
Basophils Absolute: 20 {cells}/uL (ref 0–200)
Basophils Relative: 0.4 %
Eosinophils Absolute: 40 {cells}/uL (ref 15–500)
Eosinophils Relative: 0.8 %
HCT: 39.3 % (ref 35.0–45.0)
Hemoglobin: 13 g/dL (ref 11.7–15.5)
MCH: 32.7 pg (ref 27.0–33.0)
MCHC: 33.1 g/dL (ref 32.0–36.0)
MCV: 98.7 fL (ref 80.0–100.0)
MPV: 10.8 fL (ref 7.5–12.5)
Monocytes Relative: 5.7 %
Neutro Abs: 2080 {cells}/uL (ref 1500–7800)
Neutrophils Relative %: 41.6 %
Platelets: 184 10*3/uL (ref 140–400)
RBC: 3.98 10*6/uL (ref 3.80–5.10)
RDW: 12.1 % (ref 11.0–15.0)
Total Lymphocyte: 51.5 %
WBC: 5 10*3/uL (ref 3.8–10.8)

## 2024-03-23 LAB — SEDIMENTATION RATE: Sed Rate: 6 mm/h (ref 0–30)

## 2024-03-23 LAB — ANA,IFA RA DIAG PNL W/RFLX TIT/PATN
Anti Nuclear Antibody (ANA): NEGATIVE
Cyclic Citrullin Peptide Ab: <16 U
Rheumatoid fact SerPl-aCnc: <10 [IU]/mL (ref <14)

## 2024-03-23 LAB — C-REACTIVE PROTEIN: CRP: <3 mg/L (ref <8.0)

## 2024-03-23 LAB — URIC ACID: Uric Acid, Serum: 2 mg/dL — ABNORMAL LOW (ref 2.5–7.0)

## 2024-04-05 ENCOUNTER — Other Ambulatory Visit (INDEPENDENT_AMBULATORY_CARE_PROVIDER_SITE_OTHER): Payer: Self-pay | Admitting: Gastroenterology

## 2024-04-05 DIAGNOSIS — E1143 Type 2 diabetes mellitus with diabetic autonomic (poly)neuropathy: Secondary | ICD-10-CM

## 2024-04-28 ENCOUNTER — Telehealth (INDEPENDENT_AMBULATORY_CARE_PROVIDER_SITE_OTHER): Payer: Self-pay | Admitting: *Deleted

## 2024-04-28 ENCOUNTER — Ambulatory Visit (INDEPENDENT_AMBULATORY_CARE_PROVIDER_SITE_OTHER): Admitting: Gastroenterology

## 2024-04-28 NOTE — Telephone Encounter (Signed)
 Ov sch 05/13/24

## 2024-04-28 NOTE — Telephone Encounter (Signed)
 Patient was a NO Show    Please advise the following for this patient.    No follow-up necessary.   Follow-up urgent. Locate patient immediately.  Follow-up necessary -- Contact patient and schedule visit in ___ days.   Follow-up advised -- Contact patient and schedule visit in ___ weeks.

## 2024-05-01 ENCOUNTER — Other Ambulatory Visit: Payer: Self-pay | Admitting: Family Medicine

## 2024-05-01 DIAGNOSIS — G4709 Other insomnia: Secondary | ICD-10-CM

## 2024-05-06 ENCOUNTER — Ambulatory Visit: Admitting: Family Medicine

## 2024-05-06 VITALS — BP 120/72 | HR 108 | Temp 97.3°F | Ht 63.0 in | Wt 123.6 lb

## 2024-05-06 DIAGNOSIS — E1069 Type 1 diabetes mellitus with other specified complication: Secondary | ICD-10-CM

## 2024-05-06 DIAGNOSIS — R202 Paresthesia of skin: Secondary | ICD-10-CM | POA: Diagnosis not present

## 2024-05-06 DIAGNOSIS — Z780 Asymptomatic menopausal state: Secondary | ICD-10-CM | POA: Diagnosis not present

## 2024-05-06 NOTE — Patient Instructions (Signed)
 Xray ordered.  Dexa ordered. Call [phone] to schedule.  I will reach out to my pharmacist.  Follow up in 3 months.

## 2024-05-07 ENCOUNTER — Other Ambulatory Visit: Payer: Self-pay | Admitting: Family Medicine

## 2024-05-07 ENCOUNTER — Telehealth: Payer: Self-pay | Admitting: Pharmacist

## 2024-05-07 DIAGNOSIS — E1069 Type 1 diabetes mellitus with other specified complication: Secondary | ICD-10-CM

## 2024-05-07 DIAGNOSIS — R202 Paresthesia of skin: Secondary | ICD-10-CM | POA: Insufficient documentation

## 2024-05-07 MED ORDER — INSULIN LISPRO (1 UNIT DIAL) 100 UNIT/ML (KWIKPEN): 5.0000 [IU] | PEN_INJECTOR | Freq: Three times a day (TID) | SUBCUTANEOUS | 11 refills | Status: DC

## 2024-05-07 NOTE — Progress Notes (Signed)
 Subjective:  Patient ID: [name] [name], female    DOB: 07-21-1971  Age: 53 y.o. MRN: [id number]  CC:  Follow up   HPI:  53 year old female with type 1 diabetes, pancreatic insufficiency, gastroparesis presents for follow-up.  Patient has an upcoming appoint with endocrinology.  Average blood glucose 171 over the past 90 days according to her freestyle libre.  She has had 55 low events in the past 90 days.  They occur predominantly in the morning and late at night.  Interestingly, based on her graph they said the times where her blood sugar is typically elevated as well.  She endorses compliance with her insulin  regimen, Tresiba  34 units at bedtime and Humalog  10 units with each meal (sometimes more depending on her blood glucose levels).  She does state that she often forgets to take her insulin  when she eats and takes it later on after her meal.  Patient reports that she is having paresthesias of her right upper extremity.  Will discuss today.  Additionally, patient states that her pain management physician would like her to get a bone density scan.  Patient Active Problem List   Diagnosis Date Noted   Paresthesia of right upper extremity 05/07/2024   Grade I hemorrhoids 01/30/2024   Diabetic gastroparesis (HCC) 05/17/2023   Port-A-Cath in place 01/02/2023   Pancreatic insufficiency 08/15/2021   Insomnia 06/28/2014   Type 1 diabetes mellitus with other specified complication (HCC) 01/22/2013   Drug-induced constipation 05/13/2012   Esophageal reflux 02/03/2009    Social Hx   Social History   Socioeconomic History   Marital status: Legally Separated    Spouse name: Not on file   Number of children: Not on file   Years of education: Not on file   Highest education level: Some college, no degree  Occupational History   Not on file  Tobacco Use   Smoking status: Never   Smokeless tobacco: Never  Vaping Use   Vaping status: Never Used  Substance and Sexual Activity    Alcohol  use: No   Drug use: Not Currently   Sexual activity: Yes    Birth control/protection: Surgical  Other Topics Concern   Not on file  Social History Narrative   Not on file   Social Drivers of Health   Financial Resource Strain: Medium Risk (07/08/2023)   Overall Financial Resource Strain (CARDIA)    Difficulty of Paying Living Expenses: Somewhat hard  Food Insecurity: No Food Insecurity (07/08/2023)   Hunger Vital Sign    Worried About Running Out of Food in the Last Year: Never true    Ran Out of Food in the Last Year: Never true  Transportation Needs: No Transportation Needs (07/08/2023)   PRAPARE - Administrator, Civil Service (Medical): No    Lack of Transportation (Non-Medical): No  Physical Activity: Insufficiently Active (07/08/2023)   Exercise Vital Sign    Days of Exercise per Week: 3 days    Minutes of Exercise per Session: 30 min  Stress: No Stress Concern Present (07/08/2023)   [employer] of Occupational Health - Occupational Stress Questionnaire    Feeling of Stress : Only a little  Social Connections: Moderately Integrated (07/08/2023)   Social Connection and Isolation Panel    Frequency of Communication with Friends and Family: More than three times a week    Frequency of Social Gatherings with Friends and Family: Once a week    Attends Religious Services: More than 4 times per year  Active Member of Clubs or Organizations: No    Attends Banker Meetings: Never    Marital Status: Married  Recent Concern: Social Connections - Moderately Isolated (05/31/2023)   Social Connection and Isolation Panel    Frequency of Communication with Friends and Family: More than three times a week    Frequency of Social Gatherings with Friends and Family: More than three times a week    Attends Religious Services: Never    Database administrator or Organizations: No    Attends Engineer, structural: Never    Marital Status: Married     Review of Systems Per HPI  Objective:  BP 120/72   Pulse (!) 108   Temp (!) 97.3 F (36.3 C)   Ht 5' 3 (1.6 m)   Wt 123 lb 9.6 oz (56.1 kg)   SpO2 98%   BMI 21.89 kg/m      05/06/2024   10:14 AM 03/20/2024    1:09 PM 02/05/2024    1:51 PM  BP/Weight  Systolic BP 120 125 118  Diastolic BP 72 81 66  Wt. (Lbs) 123.6  130  BMI 21.89 kg/m2  23.03 kg/m2    Physical Exam Vitals and nursing note reviewed.  Constitutional:      General: She is not in acute distress.    Appearance: Normal appearance.  HENT:     Head: Normocephalic and atraumatic.  Cardiovascular:     Rate and Rhythm: Normal rate and regular rhythm.  Pulmonary:     Effort: Pulmonary effort is normal.     Breath sounds: Normal breath sounds. No wheezing, rhonchi or rales.  Neurological:     Mental Status: She is alert.  Psychiatric:        Mood and Affect: Mood normal.        Behavior: Behavior normal.     Lab Results  Component Value Date   WBC 5.0 03/20/2024   HGB 13.0 03/20/2024   HCT 39.3 03/20/2024   PLT 184 03/20/2024   GLUCOSE 197 (H) 03/20/2024   CHOL 84 12/27/2023   TRIG 74 12/27/2023   HDL 29 (L) 12/27/2023   LDLCALC 40 12/27/2023   ALT 18 03/20/2024   AST 16 03/20/2024   NA 139 03/20/2024   K 4.4 03/20/2024   CL 109 03/20/2024   CREATININE 0.51 03/20/2024   BUN 15 03/20/2024   CO2 23 03/20/2024   INR 1.14 01/08/2013   HGBA1C 9.9 (H) 12/27/2023     Assessment & Plan:  Type 1 diabetes mellitus with other specified complication (HCC) Assessment & Plan: Control is improved but still not at goal.  I am reaching out to my pharmacist for further guidance regarding adjusting her insulin  regimen given numerous hypoglycemic episodes and blood glucose readings still not being at goal.   Post-menopausal -     DG Bone Density  Paresthesia of right upper extremity Assessment & Plan: Cervical x-ray to evaluate for potential cervical radiculopathy.  Orders: -     DG Cervical  Spine Complete    Follow-up:  Return in about 3 months (around 08/06/2024).  Jacqulyn Ahle DO [facility]

## 2024-05-07 NOTE — Assessment & Plan Note (Signed)
 Control is improved but still not at goal.  I am reaching out to my pharmacist for further guidance regarding adjusting her insulin  regimen given numerous hypoglycemic episodes and blood glucose readings still not being at goal.

## 2024-05-07 NOTE — Assessment & Plan Note (Signed)
 Cervical x-ray to evaluate for potential cervical radiculopathy.

## 2024-05-07 NOTE — Telephone Encounter (Unsigned)
 Hypo in the AM and at midnight,  Eats at 10-11am, 4-5pm GMI%7.4 for 90days GMI 9.0% Avg BG 90 days 172 Avg BG 30 days 197 Avg BG 14 days 198  Recommended patient decrease basal insulin  to 30 units nightly Encouraged giving meal time insulin  15-20 min prior to meal.  She needs to work on eating consistent meals 3 times daily (healthy plate method).  She could benefit from a high protein/fiber snack before bed time to prevent hypoglycemia over night  Diet has been limited due to dental work; now has dentures and is able to eat more foods. Highly recommended she keep Endocrine appt next week.  She would be an excellent candidate for

## 2024-05-07 NOTE — Telephone Encounter (Signed)
 Copied from CRM [id number]. Topic: Clinical - Medication Refill >> May 07, 2024  2:34 PM Delon T wrote: Medication: insulin  lispro (HUMALOG  KWIKPEN) 100 UNIT/ML KwikPen  Has the patient contacted their pharmacy? No (Agent: If no, request that the patient contact the pharmacy for the refill. If patient does not wish to contact the pharmacy document the reason why and proceed with request.) (Agent: If yes, when and what did the pharmacy advise?)  This is the patient's preferred pharmacy:  [facility] - [place], [place] - [address] [place] [address] Phone: [phone] Fax: [phone]    Is this the correct pharmacy for this prescription? Yes If no, delete pharmacy and type the correct one.   Has the prescription been filled recently? Yes  Is the patient out of the medication? Yes  Has the patient been seen for an appointment in the last year OR does the patient have an upcoming appointment? Yes  Can we respond through MyChart? Yes  Agent: Please be advised that Rx refills may take up to 3 business days. We ask that you follow-up with your pharmacy.

## 2024-05-11 NOTE — Patient Instructions (Signed)

## 2024-05-13 ENCOUNTER — Ambulatory Visit: Admitting: Nurse Practitioner

## 2024-05-13 ENCOUNTER — Ambulatory Visit (INDEPENDENT_AMBULATORY_CARE_PROVIDER_SITE_OTHER): Admitting: Gastroenterology

## 2024-05-13 ENCOUNTER — Encounter: Payer: Self-pay | Admitting: Nurse Practitioner

## 2024-05-13 VITALS — BP 112/78 | HR 80 | Ht 63.0 in | Wt 129.4 lb

## 2024-05-13 DIAGNOSIS — E10649 Type 1 diabetes mellitus with hypoglycemia without coma: Secondary | ICD-10-CM

## 2024-05-13 DIAGNOSIS — Z794 Long term (current) use of insulin: Secondary | ICD-10-CM

## 2024-05-13 LAB — POCT GLYCOSYLATED HEMOGLOBIN (HGB A1C): Hemoglobin A1C: 7.1 % — AB (ref 4.0–5.6)

## 2024-05-13 NOTE — Progress Notes (Signed)
 Endocrinology Consult Note       05/13/2024, 2:30 PM   Subjective:    Patient ID: [name] [name], female    DOB: 1971-03-23.  [name] [name] is being seen in consultation for management of currently uncontrolled symptomatic diabetes requested by  Cook, Jayce G, DO.   Past Medical History:  Diagnosis Date   Acute bacterial bronchitis    dx 12-06-2014  --  FINISHED Z-PAC-  NO FEVER BUT STILL INTERMITTANT  PRODUCTIVE COUGH   Anxiety    Chronic pain    has pain specialist   Diabetes mellitus type I (HCC)    GERD (gastroesophageal reflux disease)    History of chronic gastritis    History of chronic pancreatitis    multple recurrent--  s/p  whipple 2011   History of esophageal ulcer    History of kidney stones    History of subarachnoid hemorrhage    01-08-2013--  fell on concrete--  resolved without surgical intervention   History of suicide attempt    hx multiple attempts by overdosing of meds   IBS (irritable bowel syndrome)    Left ureteral calculus    Major depression, recurrent, chronic (HCC)    w/  Hx  multiple suicide attempts (overdose)   Migraine headache    PONV (postoperative nausea and vomiting)    Type 1 diabetes mellitus (HCC)    Whipple disease 2011    Past Surgical History:  Procedure Laterality Date   CESAREAN SECTION  x2  last one 02-29-2000   last one w/ BILATERAL TUBAL LIGATION   COLONOSCOPY  03-14-2009   dx ileocolonscopy   COLONOSCOPY N/A 01/30/2024   Procedure: COLONOSCOPY;  Surgeon: Cinderella Deatrice FALCON, MD;  [facility]: [facility];  Service: Endoscopy;  Laterality: N/A;  8:15AM;ASA 3   CYSTOSCOPY WITH RETROGRADE PYELOGRAM, URETEROSCOPY AND STENT PLACEMENT Left 12/20/2014   Procedure: CYSTOSCOPY, LEFT URETEROSCOPY, STONE REMOVAL, LASER LITHOTRIPSY;  Surgeon: Alm Fragmin, MD;  Location: [facility];  Service: Urology;  Laterality: Left;   ESOPHAGOGASTRODUODENOSCOPY   last  one 12-31-2008   ESOPHAGOGASTRODUODENOSCOPY (EGD) WITH PROPOFOL  N/A 05/28/2017   Procedure: ESOPHAGOGASTRODUODENOSCOPY (EGD) WITH PROPOFOL ;  Surgeon: Golda Claudis PENNER, MD;  Location: [facility];  Service: Endoscopy;  Laterality: N/A;  2:00   FLEXIBLE SIGMOIDOSCOPY  06/13/2023   Procedure: FLEXIBLE SIGMOIDOSCOPY;  Surgeon: Cinderella Deatrice FALCON, MD;  Location: [facility];  Service: Endoscopy;;   HOLMIUM LASER APPLICATION Left 12/20/2014   Procedure: HOLMIUM LASER APPLICATION;  Surgeon: Alm Fragmin, MD;  Location: [facility];  Service: Urology;  Laterality: Left;   LAPAROSCOPIC ASSISTED VAGINAL HYSTERECTOMY  12-16-2006   LAPAROSCOPIC NISSEN FUNDOPLICATION  Nov 2001   w/  CHOLECYSTECTOMY   PORTACATH PLACEMENT N/A 07/12/2017   Procedure: INSERTION PORT-A-CATH;  Surgeon: Mavis Anes, MD;  Location: [facility];  Service: General;  Laterality: N/A;   REPAIR PERFORATED VISCUS  POST WHIPPLE  x5    last one 06-23-2012   TUBAL LIGATION     WHIPPLE PROCEDURE  2011   at [facility]    Social History   Socioeconomic History   Marital status: Legally Separated    Spouse name: Not on file  Number of children: Not on file   Years of education: Not on file   Highest education level: Some college, no degree  Occupational History   Not on file  Tobacco Use   Smoking status: Never   Smokeless tobacco: Never  Vaping Use   Vaping status: Never Used  Substance and Sexual Activity   Alcohol  use: No   Drug use: Not Currently   Sexual activity: Yes    Birth control/protection: Surgical  Other Topics Concern   Not on file  Social History Narrative   Not on file   Social Drivers of Health   Financial Resource Strain: Medium Risk (07/08/2023)   Overall Financial Resource Strain (CARDIA)    Difficulty of Paying Living Expenses: Somewhat hard  Food Insecurity: No Food Insecurity (07/08/2023)   Hunger Vital Sign    Worried About Running Out of Food in the Last Year: Never true    Ran Out  of Food in the Last Year: Never true  Transportation Needs: No Transportation Needs (07/08/2023)   PRAPARE - Administrator, Civil Service (Medical): No    Lack of Transportation (Non-Medical): No  Physical Activity: Insufficiently Active (07/08/2023)   Exercise Vital Sign    Days of Exercise per Week: 3 days    Minutes of Exercise per Session: 30 min  Stress: No Stress Concern Present (07/08/2023)   [employer] of Occupational Health - Occupational Stress Questionnaire    Feeling of Stress : Only a little  Social Connections: Moderately Integrated (07/08/2023)   Social Connection and Isolation Panel    Frequency of Communication with Friends and Family: More than three times a week    Frequency of Social Gatherings with Friends and Family: Once a week    Attends Religious Services: More than 4 times per year    Active Member of [employer] or Organizations: No    Attends Banker Meetings: Never    Marital Status: Married  Recent Concern: Social Connections - Moderately Isolated (05/31/2023)   Social Connection and Isolation Panel    Frequency of Communication with Friends and Family: More than three times a week    Frequency of Social Gatherings with Friends and Family: More than three times a week    Attends Religious Services: Never    Database administrator or Organizations: No    Attends Engineer, structural: Never    Marital Status: Married    Family History  Problem Relation Age of Onset   Hypertension Mother    Hyperlipidemia Mother    Diabetes Father    Stroke Father     Outpatient Encounter Medications as of 05/13/2024  Medication Sig   Accu-Chek FastClix Lancets MISC Use to monitor glucose 4 times daily   amitriptyline (ELAVIL) 25 MG tablet Take 25 mg by mouth daily.   buprenorphine (BUTRANS) 20 MCG/HR PTWK Place onto the skin once a week.   Continuous Glucose Sensor (FREESTYLE LIBRE 3 SENSOR) MISC Place 1 sensor on the skin every 14  days. Use to check glucose continuously   glucose blood (ACCU-CHEK GUIDE TEST) test strip Use up to 4 times daily to check blood sugar.   glucose blood (ACCU-CHEK GUIDE) test strip Use as instructed   insulin  degludec (TRESIBA  FLEXTOUCH) 200 UNIT/ML FlexTouch Pen Inject 34 Units into the skin at bedtime. Stop Novolin N   insulin  lispro (HUMALOG  KWIKPEN) 100 UNIT/ML KwikPen Inject 5-8 Units into the skin 3 (three) times daily before meals. Stop Insulin   REGULAR   Insulin  Pen Needle (PIP PEN NEEDLES 32G X ) 32G X 4 MM MISC Use to inject insulin  4 times daily as directed. DX; E10.9   lubiprostone  (AMITIZA ) 24 MCG capsule Take 1 capsule (24 mcg total) by mouth 2 (two) times daily with a meal.   naloxone (NARCAN) nasal spray 4 mg/0.1 mL SMARTSIG:Both Nares   oxyCODONE  (OXY IR/ROXICODONE ) 5 MG immediate release tablet Take 1 tablet by mouth 3 (three) times daily.   Pancrelipase , Lip-Prot-Amyl, (ZENPEP ) 40000-126000 units CPEP Take one every 4 hours   pantoprazole  (PROTONIX ) 40 MG tablet TAKE ONE TABLET BY MOUTH ONCE DAILY.   promethazine  (PHENERGAN ) 50 MG tablet TAKE 1 TABLET BY MOUTH 3 TIMES DAILY AS NEEDED.   zolpidem  (AMBIEN ) 5 MG tablet Take 1 tablet (5 mg total) by mouth at bedtime as needed. for sleep   No facility-administered encounter medications on file as of 05/13/2024.    ALLERGIES: Allergies  Allergen Reactions   Buspar [Buspirone] Other (See Comments)    Dizziness   Metoclopramide Hcl Other (See Comments)    lock-jaw    VACCINATION STATUS: Immunization History  Administered Date(s) Administered   Influenza Split 01/09/2013   Influenza, Seasonal, Injecte, Preservative Fre 07/08/2023   Influenza,inj,Quad PF,6+ Mos 08/14/2013   Influenza-Unspecified 09/08/2011   PNEUMOCOCCAL CONJUGATE-20 01/06/2024   Td 01/08/2013   Tdap 09/20/2011    Diabetes She presents for her initial diabetic visit. She has type 1 diabetes mellitus. Onset time: diagnosed at approx age of 8 after  Whipple procedure. Her disease course has been fluctuating. Hypoglycemia symptoms include hunger, nervousness/anxiousness, sweats and tremors. There are no hypoglycemic complications. (Gastroparesis) Risk factors for coronary artery disease include diabetes mellitus. Current diabetic treatment includes intensive insulin  program. She is compliant with treatment most of the time. Her weight is fluctuating minimally. She is following a generally unhealthy diet. When asked about meal planning, she reported none. She has had a previous visit with a dietitian. She participates in exercise intermittently. Her home blood glucose trend is fluctuating dramatically. Her overall blood glucose range is >200 mg/dl. (She presents today for her consultation with her CGM showing dramatically fluctuating glycemic profile with frequent hypoglycemia.  Her POCT A1c today is 7.1%, improving from last A1c of 9.9%.  Analysis of her CGM shows TIR 37%, TAR 57%, TBR 6% with a GMI of 8.2%.  She did previously see Dr. Lenis in the past for diabetes management.  She drinks diet sodas and some water.  She eats mostly soft foods due to recent dentures.  She enjoys walking for exercise.  She is overdue for eye exam, has never seen podiatry in the past.) An ACE inhibitor/angiotensin II receptor blocker is not being taken. She does not see a podiatrist.Eye exam is not current.     Review of systems  Constitutional: + Minimally fluctuating body weight, current Body mass index is 22.92 kg/m., no fatigue, no subjective hyperthermia, no subjective hypothermia Eyes: no blurry vision, no xerophthalmia ENT: no sore throat, no nodules palpated in throat, no dysphagia/odynophagia, no hoarseness Cardiovascular: no chest pain, no shortness of breath, no palpitations, no leg swelling Respiratory: no cough, no shortness of breath Gastrointestinal: no nausea/vomiting/diarrhea Musculoskeletal: no muscle/joint aches Skin: no rashes, no  hyperemia Neurological: no tremors, no numbness, no tingling, no dizziness Psychiatric: no depression, no anxiety  Objective:     BP 112/78 (BP Location: Left Arm, Patient Position: Sitting, Cuff Size: Large)   Pulse 80   Ht 5' 3 (1.6 m)  Wt 129 lb 6.4 oz (58.7 kg)   BMI 22.92 kg/m   Wt Readings from Last 3 Encounters:  05/13/24 129 lb 6.4 oz (58.7 kg)  05/06/24 123 lb 9.6 oz (56.1 kg)  02/05/24 130 lb (59 kg)     BP Readings from Last 3 Encounters:  05/13/24 112/78  05/06/24 120/72  03/20/24 125/81     Physical Exam- Limited  Constitutional:  Body mass index is 22.92 kg/m. , not in acute distress, normal state of mind Eyes:  EOMI, no exophthalmos Neck: Supple Cardiovascular: RRR, no murmurs, rubs, or gallops, no edema Respiratory: Adequate breathing efforts, no crackles, rales, rhonchi, or wheezing Musculoskeletal: no gross deformities, strength intact in all four extremities, no gross restriction of joint movements Skin:  no rashes, no hyperemia Neurological: no tremor with outstretched hands   Diabetic Foot Exam - Simple   No data filed      CMP ( most recent) CMP     Component Value Date/Time   NA 139 03/20/2024 1518   NA 140 05/08/2017 1504   K 4.4 03/20/2024 1518   CL 109 03/20/2024 1518   CO2 23 03/20/2024 1518   GLUCOSE 197 (H) 03/20/2024 1518   BUN 15 03/20/2024 1518   BUN 17 05/11/2021 0000   CREATININE 0.51 03/20/2024 1518   CALCIUM 8.4 (L) 03/20/2024 1518   PROT 6.4 03/20/2024 1518   PROT 7.6 05/08/2017 1504   ALBUMIN 3.6 06/11/2023 0926   ALBUMIN 4.3 05/08/2017 1504   AST 16 03/20/2024 1518   ALT 18 03/20/2024 1518   ALKPHOS 89 06/11/2023 0926   BILITOT 0.4 03/20/2024 1518   BILITOT 0.5 05/08/2017 1504   EGFR 112 03/20/2024 1518   GFRNONAA >60 10/29/2023 0846     Diabetic Labs (most recent): Lab Results  Component Value Date   HGBA1C 7.1 (A) 05/13/2024   HGBA1C 9.9 (H) 12/27/2023   HGBA1C 8.9 (A) 08/15/2021     Lipid Panel  ( most recent) Lipid Panel     Component Value Date/Time   CHOL 84 12/27/2023 1329   TRIG 74 12/27/2023 1329   HDL 29 (L) 12/27/2023 1329   CHOLHDL 2.9 12/27/2023 1329   LDLCALC 40 12/27/2023 1329      No results found for: TSH, FREET4         Assessment & Plan:   1) Type 1 diabetes mellitus with hypoglycemia and without coma (HCC) (Primary)  She presents today for her consultation with her CGM showing dramatically fluctuating glycemic profile with frequent hypoglycemia.  Her POCT A1c today is 7.1%, improving from last A1c of 9.9%.  Analysis of her CGM shows TIR 37%, TAR 57%, TBR 6% with a GMI of 8.2%.  She did previously see Dr. Lenis in the past for diabetes management.  She drinks diet sodas and some water.  She eats mostly soft foods due to recent dentures.  She enjoys walking for exercise.  She is overdue for eye exam, has never seen podiatry in the past.  - [name] [name] has currently uncontrolled symptomatic type 1 DM since 53 years of age, with most recent A1c of 7.1 %.   -Recent labs reviewed.  - I had a long discussion with her about the progressive nature of diabetes and the pathology behind its complications. -her diabetes is complicated by gastroparesis and she remains at a high risk for more acute and chronic complications which include CAD, CVA, CKD, retinopathy, and neuropathy. These are all discussed in detail with her.  The following Lifestyle Medicine recommendations according to American College of Lifestyle Medicine [facility]) were discussed and offered to patient and she agrees to start the journey:  A. Whole Foods, Plant-based plate comprising of fruits and vegetables, plant-based proteins, whole-grain carbohydrates was discussed in detail with the patient.   A list for source of those nutrients were also provided to the patient.  Patient will use only water or unsweetened tea for hydration. B.  The need to stay away from risky substances including alcohol ,  smoking; obtaining 7 to 9 hours of restorative sleep, at least 150 minutes of moderate intensity exercise weekly, the importance of healthy social connections,  and stress reduction techniques were discussed. C.  A full color page of  Calorie density of various food groups per pound showing examples of each food groups was provided to the patient.  - I have counseled her on diet and weight management by adopting a carbohydrate restricted/protein rich diet. Patient is encouraged to switch to unprocessed or minimally processed complex starch and increased protein intake (animal or plant source), fruits, and vegetables. -  she is advised to stick to a routine mealtimes to eat 3 meals a day and avoid unnecessary snacks (to snack only to correct hypoglycemia).   - she acknowledges that there is a room for improvement in her food and drink choices. - Suggestion is made for her to avoid simple carbohydrates from her diet including Cakes, Sweet Desserts, Ice Cream, Soda (diet and regular), Sweet Tea, Candies, Chips, Cookies, Store Bought Juices, Alcohol  in Excess of 1-2 drinks a day, Artificial Sweeteners, Coffee Creamer, and Sugar-free Products. This will help patient to have more stable blood glucose profile and potentially avoid unintended weight gain.  - I have approached her with the following individualized plan to manage her diabetes and patient agrees:   -Will lower her Tresiba  to 14 units SQ nightly and adjust her Humalog  to 4-7 units TID with meals if glucose is above 70 and she is eating (Specific instructions on how to titrate insulin  dosage based on glucose readings given to patient in writing).  She demonstrated her ability to properly use the SSI chart to dose meal time insulin  with me today.  This is closer to her weight based insulin  dosing.  -she is encouraged to start/continue monitoring glucose 4 times daily (using her CGM), before meals and before bed, and to call the clinic if she has  readings less than 70 or above 300 for 3 tests in a row.  - she is warned not to take insulin  without proper monitoring per orders. - Adjustment parameters are given to her for hypo and hyperglycemia in writing.  -She is interested in pumps in the future.  I encouraged her to do some research as to what style pump may fit her lifestyle the best and to call insurance to find out if it is affordable.  -She is not a candidate for non-insulin  therapies given her history of type 1 diabetes.  Insulin  is the exclusive treatment.  - Specific targets for  A1c; LDL, HDL, and Triglycerides were discussed with the patient.  2) Blood Pressure /Hypertension:  her blood pressure is controlled to target.   she is advised to continue her current medications as prescribed by her PCP.  3) Lipids/Hyperlipidemia:    Review of her recent lipid panel from 12/27/23 showed controlled LDL at 40, without the use of lipid lowering agents.  4)  Weight/Diet:  her Body mass index is 22.92 kg/m.  -  she is NOT a candidate for weight loss  Exercise, and detailed carbohydrates information provided  -  detailed on discharge instructions.  5) Chronic Care/Health Maintenance: -she is not on ACEI/ARB or Statin medications and is encouraged to initiate and continue to follow up with Ophthalmology, Dentist, Podiatrist at least yearly or according to recommendations, and advised to stay away from smoking. I have recommended yearly flu vaccine and pneumonia vaccine at least every 5 years; moderate intensity exercise for up to 150 minutes weekly; and sleep for at least 7 hours a day.  - she is advised to maintain close follow up with Cook, Jayce G, DO for primary care needs, as well as her other providers for optimal and coordinated care.   - Time spent in this patient care: 60 min, which was spent in counseling her about her diabetes and the rest reviewing her blood glucose logs, discussing her hypoglycemia and hyperglycemia  episodes, reviewing her current and previous labs/studies (including abstraction from other facilities) and medications doses and developing a long term treatment plan based on the latest standards of care/guidelines; and documenting her care.    Please refer to Patient Instructions for Blood Glucose Monitoring and Insulin /Medications Dosing Guide in media tab for additional information. Please also refer to Patient Self Inventory in the Media tab for reviewed elements of pertinent patient history.  [name] [name] participated in the discussions, expressed understanding, and voiced agreement with the above plans.  All questions were answered to her satisfaction. she is encouraged to contact clinic should she have any questions or concerns prior to her return visit.     Follow up plan: - Return in about 3 months (around 08/13/2024) for Diabetes F/U with A1c in office, No previsit labs, Bring meter and logs.    Benton Rio, [facility] [address] [place], [place] [address] Phone: [phone] Fax: [phone]  05/13/2024, 2:30 PM

## 2024-05-14 ENCOUNTER — Other Ambulatory Visit (INDEPENDENT_AMBULATORY_CARE_PROVIDER_SITE_OTHER): Payer: Self-pay | Admitting: Gastroenterology

## 2024-05-14 MED ORDER — DEXCOM G7 SENSOR MISC: 1.0000 | 3 refills | Status: DC

## 2024-05-14 MED ORDER — OMNIPOD 5 DEXG7G6 INTRO GEN 5 KIT: PACK | 0 refills | Status: AC

## 2024-05-14 MED ORDER — OMNIPOD 5 DEXG7G6 PODS GEN 5 MISC: 6 refills | Status: AC

## 2024-05-18 ENCOUNTER — Telehealth: Payer: Self-pay

## 2024-05-18 NOTE — Telephone Encounter (Signed)
 ERROR

## 2024-05-25 ENCOUNTER — Encounter (HOSPITAL_COMMUNITY): Payer: Self-pay | Admitting: Family Medicine

## 2024-05-25 ENCOUNTER — Telehealth: Payer: Self-pay

## 2024-05-25 ENCOUNTER — Other Ambulatory Visit (HOSPITAL_COMMUNITY): Payer: Self-pay

## 2024-05-25 ENCOUNTER — Telehealth: Payer: Self-pay | Admitting: Nurse Practitioner

## 2024-05-25 NOTE — Telephone Encounter (Signed)
 PA addressed in separate encounter.

## 2024-05-25 NOTE — Telephone Encounter (Signed)
 Pharmacy Patient Advocate Encounter   Received notification from Pt Calls Messages that prior authorization for Omnipod is required/requested.   Insurance verification completed.   The patient is insured through [employer] .   Per test claim: The current 30 day co-pay is, $0.  No PA needed at this time. This test claim was processed through [facility]- copay amounts may vary at other pharmacies due to pharmacy/plan contracts, or as the patient moves through the different stages of their insurance plan.

## 2024-05-25 NOTE — Telephone Encounter (Signed)
 Will you let the patient know the Omnipod is available without a PA?

## 2024-05-26 NOTE — Telephone Encounter (Signed)
 Patient was called and made aware.

## 2024-05-27 ENCOUNTER — Ambulatory Visit (INDEPENDENT_AMBULATORY_CARE_PROVIDER_SITE_OTHER): Admitting: Gastroenterology

## 2024-05-27 MED ORDER — DEXCOM G7 SENSOR MISC
1.0000 | 3 refills | Status: DC
Start: 2024-05-27 — End: 2024-07-09

## 2024-05-27 NOTE — Addendum Note (Signed)
 Addended by: Bryley Kovacevic J on: 05/27/2024 03:13 PM   Modules accepted: Orders

## 2024-05-29 ENCOUNTER — Ambulatory Visit

## 2024-06-05 ENCOUNTER — Ambulatory Visit: Payer: Medicare HMO

## 2024-06-15 ENCOUNTER — Other Ambulatory Visit: Payer: Self-pay | Admitting: Nurse Practitioner

## 2024-06-15 MED ORDER — INSULIN LISPRO 100 UNIT/ML IJ SOLN: INTRAMUSCULAR | 3 refills | Status: AC

## 2024-06-18 ENCOUNTER — Encounter (INDEPENDENT_AMBULATORY_CARE_PROVIDER_SITE_OTHER): Payer: Self-pay | Admitting: *Deleted

## 2024-06-29 ENCOUNTER — Ambulatory Visit (INDEPENDENT_AMBULATORY_CARE_PROVIDER_SITE_OTHER): Admitting: Gastroenterology

## 2024-07-09 MED ORDER — DEXCOM G7 SENSOR MISC: 1.0000 | 3 refills | Status: AC

## 2024-07-09 NOTE — Addendum Note (Signed)
 Addended by: Darrian Grzelak J on: 07/09/2024 03:27 PM   Modules accepted: Orders

## 2024-07-13 ENCOUNTER — Encounter (INDEPENDENT_AMBULATORY_CARE_PROVIDER_SITE_OTHER): Payer: Self-pay

## 2024-07-14 ENCOUNTER — Ambulatory Visit (INDEPENDENT_AMBULATORY_CARE_PROVIDER_SITE_OTHER): Admitting: Gastroenterology

## 2024-07-16 ENCOUNTER — Encounter: Payer: Self-pay | Admitting: Family Medicine

## 2024-07-16 ENCOUNTER — Encounter: Payer: Self-pay | Admitting: Nurse Practitioner

## 2024-07-16 ENCOUNTER — Other Ambulatory Visit: Payer: Self-pay | Admitting: Nurse Practitioner

## 2024-07-16 DIAGNOSIS — Z1231 Encounter for screening mammogram for malignant neoplasm of breast: Secondary | ICD-10-CM

## 2024-07-21 ENCOUNTER — Encounter (INDEPENDENT_AMBULATORY_CARE_PROVIDER_SITE_OTHER): Payer: Self-pay | Admitting: Gastroenterology

## 2024-07-28 ENCOUNTER — Encounter (INDEPENDENT_AMBULATORY_CARE_PROVIDER_SITE_OTHER): Payer: Self-pay | Admitting: Gastroenterology

## 2024-07-28 ENCOUNTER — Telehealth (INDEPENDENT_AMBULATORY_CARE_PROVIDER_SITE_OTHER): Payer: Self-pay | Admitting: Gastroenterology

## 2024-07-28 ENCOUNTER — Ambulatory Visit (INDEPENDENT_AMBULATORY_CARE_PROVIDER_SITE_OTHER): Admitting: Gastroenterology

## 2024-07-28 VITALS — BP 111/77 | HR 89 | Temp 98.3°F | Ht 63.0 in | Wt 126.4 lb

## 2024-07-28 DIAGNOSIS — K8681 Exocrine pancreatic insufficiency: Secondary | ICD-10-CM

## 2024-07-28 DIAGNOSIS — K5903 Drug induced constipation: Secondary | ICD-10-CM | POA: Diagnosis not present

## 2024-07-28 DIAGNOSIS — E1143 Type 2 diabetes mellitus with diabetic autonomic (poly)neuropathy: Secondary | ICD-10-CM | POA: Diagnosis not present

## 2024-07-28 DIAGNOSIS — K8689 Other specified diseases of pancreas: Secondary | ICD-10-CM

## 2024-07-28 DIAGNOSIS — Z794 Long term (current) use of insulin: Secondary | ICD-10-CM

## 2024-07-28 DIAGNOSIS — K3184 Gastroparesis: Secondary | ICD-10-CM | POA: Diagnosis not present

## 2024-07-28 DIAGNOSIS — K581 Irritable bowel syndrome with constipation: Secondary | ICD-10-CM | POA: Insufficient documentation

## 2024-07-28 MED ORDER — ZENPEP 25000-79000 UNITS PO CPEP: 1.0000 | ORAL_CAPSULE | Freq: Three times a day (TID) | ORAL | 3 refills | Status: AC

## 2024-07-28 MED ORDER — PRUCALOPRIDE SUCCINATE 2 MG PO TABS: 2.0000 mg | ORAL_TABLET | Freq: Every day | ORAL | 1 refills | Status: AC

## 2024-07-28 MED ORDER — LINACLOTIDE 290 MCG PO CAPS: 290.0000 ug | ORAL_CAPSULE | Freq: Every day | ORAL | 1 refills | Status: DC

## 2024-07-28 NOTE — Progress Notes (Signed)
 [name] [name] , M.D. Gastroenterology & Hepatology [facility] Gastroenterology [address] [place], [place] [address] Primary Care Physician: Bluford Jacqulyn MATSU, DO [address] [place] [address]  Chief Complaint: Greasy stools  History of Present Illness: [name] [name] is a 53 y.o. female with type 1 diabetes, chronic pancreatitis status post whipple 2011, ventral hernia containing transverse colon who presents for evaluation of steatorrhea, diabetic gastroparesis, constipation and colorectal cancer screening.  Patient reports today that [name] [name] (pancrelipase ) will also make her constipated and given abdominal pain is previously she could not tolerate Creon .  Currently stools are oily and floats in the toilet bowl.  When she takes an [name] [name]  she denies any steatorrhea.  Patient was taking Amitiza  without much relieving constipation.  Previously had tried Linzess and reports had good response to it she is on a chronic opioid regimen with oxycodone  and long-acting narcotic and is managed by pain management.   Sometimes she becomes nauseous at the end of the day but takes Phenergan  which is the only thing that works for her  Previously patient suffer from abdominal pain nausea vomiting and constipation was seen at [facility] where she underwent endoscopy gastric emptying test which was positive for delayed emptying, was treated empirically for SIBO with rifaximin, last A1c 7.1.  Patient had so far had poor bowel prep colonoscopies in the past 1 year    Last EGD:2020  n: - Normal esophagus. - A gastrojejunostomy was found, characterized by healthy  appearing mucosa. - Food in stomach. - Normal stomach mucosa. Biopsied. - Normal examined jejunum. Food noted.   Last Colonoscopy: 01/2024  - Stool in the entire examined colon. - No specimens collected.  Repeat 6 months   06/2023- aborted poor prep   Reports over 5 years ago but unable to find the actual  report  FHx: neg for any gastrointestinal/liver disease, no malignancies Social: neg smoking, alcohol  or illicit drug use Surgical:Whipple , multiple hernia repair  Past Medical History: Past Medical History:  Diagnosis Date   Acute bacterial bronchitis    dx 12-06-2014  --  FINISHED Z-PAC-  NO FEVER BUT STILL INTERMITTANT  PRODUCTIVE COUGH   Anxiety    Chronic pain    has pain specialist   Diabetes mellitus type I (HCC)    GERD (gastroesophageal reflux disease)    History of chronic gastritis    History of chronic pancreatitis    multple recurrent--  s/p  whipple 2011   History of esophageal ulcer    History of kidney stones    History of subarachnoid hemorrhage    01-08-2013--  fell on concrete--  resolved without surgical intervention   History of suicide attempt    hx multiple attempts by overdosing of meds   IBS (irritable bowel syndrome)    Left ureteral calculus    Major depression, recurrent, chronic    w/  Hx  multiple suicide attempts (overdose)   Migraine headache    PONV (postoperative nausea and vomiting)    Type 1 diabetes mellitus (HCC)    Whipple disease 2011    Past Surgical History: Past Surgical History:  Procedure Laterality Date   CESAREAN SECTION  x2  last one 02-29-2000   last one w/ BILATERAL TUBAL LIGATION   COLONOSCOPY  03-14-2009   dx ileocolonscopy   COLONOSCOPY N/A 01/30/2024   Procedure: COLONOSCOPY;  Surgeon: Cinderella Deatrice FALCON, MD;  [facility];  Service: Endoscopy;  Laterality: N/A;  8:15AM;ASA  3   CYSTOSCOPY WITH RETROGRADE PYELOGRAM, URETEROSCOPY AND STENT PLACEMENT Left 12/20/2014   Procedure: CYSTOSCOPY, LEFT URETEROSCOPY, STONE REMOVAL, LASER LITHOTRIPSY;  Surgeon: Alm Fragmin, MD;  [facility];  Service: Urology;  Laterality: Left;   ESOPHAGOGASTRODUODENOSCOPY  last  one 12-31-2008   ESOPHAGOGASTRODUODENOSCOPY (EGD) WITH PROPOFOL  N/A 05/28/2017   Procedure: ESOPHAGOGASTRODUODENOSCOPY (EGD) WITH  PROPOFOL ;  Surgeon: Golda Claudis PENNER, MD;  [facility];  Service: Endoscopy;  Laterality: N/A;  2:00   FLEXIBLE SIGMOIDOSCOPY  06/13/2023   Procedure: FLEXIBLE SIGMOIDOSCOPY;  Surgeon: Cinderella Deatrice FALCON, MD;  [facility];  Service: Endoscopy;;   HOLMIUM LASER APPLICATION Left 12/20/2014   Procedure: HOLMIUM LASER APPLICATION;  Surgeon: Alm Fragmin, MD;  [facility];  Service: Urology;  Laterality: Left;   LAPAROSCOPIC ASSISTED VAGINAL HYSTERECTOMY  12-16-2006   LAPAROSCOPIC NISSEN FUNDOPLICATION  Nov 2001   w/  CHOLECYSTECTOMY   PORTACATH PLACEMENT N/A 07/12/2017   Procedure: INSERTION PORT-A-CATH;  Surgeon: Mavis Anes, MD;  Location: [facility];  Service: General;  Laterality: N/A;   REPAIR PERFORATED VISCUS  POST WHIPPLE  x5    last one 06-23-2012   TUBAL LIGATION     WHIPPLE PROCEDURE  2011   at [facility]    Family History: Family History  Problem Relation Age of Onset   Hypertension Mother    Hyperlipidemia Mother    Diabetes Father    Stroke Father     Social History: Social History   Tobacco Use  Smoking Status Never  Smokeless Tobacco Never   Social History   Substance and Sexual Activity  Alcohol  Use No   Social History   Substance and Sexual Activity  Drug Use Not Currently    Allergies: Allergies  Allergen Reactions   Buspar [Buspirone] Other (See Comments)    Dizziness   Metoclopramide Hcl Other (See Comments)    lock-jaw    Medications: Current Outpatient Medications  Medication Sig Dispense Refill   Accu-Chek FastClix Lancets MISC Use to monitor glucose 4 times daily 150 each 1   amitriptyline (ELAVIL) 25 MG tablet Take 25 mg by mouth daily.     buprenorphine (BUTRANS) 20 MCG/HR PTWK Place onto the skin once a week.     Continuous Glucose Sensor (DEXCOM G7 SENSOR) MISC Inject 1 Application into the skin as directed. Change sensor every 10 days as directed. 9 each 3   Continuous Glucose Sensor (FREESTYLE  LIBRE 3 SENSOR) MISC Place 1 sensor on the skin every 14 days. Use to check glucose continuously 6 each 3   glucose blood (ACCU-CHEK GUIDE TEST) test strip Use up to 4 times daily to check blood sugar. 100 each 12   glucose blood (ACCU-CHEK GUIDE) test strip Use as instructed 150 each 2   insulin  degludec (TRESIBA  FLEXTOUCH) 200 UNIT/ML FlexTouch Pen Inject 34 Units into the skin at bedtime. Stop Novolin N 9 mL 5   Insulin  Disposable Pump (OMNIPOD 5 DEXG7G6 INTRO GEN 5) KIT Change pod every 48-72 hours 1 kit 0   Insulin  Disposable Pump (OMNIPOD 5 DEXG7G6 PODS GEN 5) MISC Change pod every 48-72 hours 15 each 6   insulin  lispro (HUMALOG ) 100 UNIT/ML injection Use with Omnipod for TDD around 50 units per day 40 mL 3   Insulin  Pen Needle (PIP PEN NEEDLES 32G X ) 32G X 4 MM MISC Use to inject insulin  4 times daily as directed. DX; E10.9 300 each 11   lubiprostone  (AMITIZA ) 24 MCG capsule  Take 1 capsule (24 mcg total) by mouth 2 (two) times daily with a meal. 60 capsule 5   naloxone (NARCAN) nasal spray 4 mg/0.1 mL SMARTSIG:Both Nares     oxyCODONE  (OXY IR/ROXICODONE ) 5 MG immediate release tablet Take 1 tablet by mouth 3 (three) times daily.     Pancrelipase , Lip-Prot-Amyl, ([name] [name] ) 40000-126000 units CPEP Take one every 4 hours 180 capsule 2   pantoprazole  (PROTONIX ) 40 MG tablet TAKE ONE TABLET BY MOUTH ONCE DAILY. 30 tablet 5   promethazine  (PHENERGAN ) 50 MG tablet TAKE 1 TABLET BY MOUTH 3 TIMES DAILY AS NEEDED. 90 tablet 3   zolpidem  (AMBIEN ) 5 MG tablet Take 1 tablet (5 mg total) by mouth at bedtime as needed. for sleep 30 tablet 3   No current facility-administered medications for this visit.    Review of Systems: GENERAL: negative for malaise, night sweats HEENT: No changes in hearing or vision, no nose bleeds or other nasal problems. NECK: Negative for lumps, goiter, pain and significant neck swelling RESPIRATORY: Negative for cough, wheezing CARDIOVASCULAR: Negative for chest pain, leg  swelling, palpitations, orthopnea GI: SEE HPI MUSCULOSKELETAL: Negative for joint pain or swelling, back pain, and muscle pain. SKIN: Negative for lesions, rash HEMATOLOGY Negative for prolonged bleeding, bruising easily, and swollen nodes. ENDOCRINE: Negative for cold or heat intolerance, polyuria, polydipsia and goiter. NEURO: negative for tremor, gait imbalance, syncope and seizures. The remainder of the review of systems is noncontributory.   Physical Exam: BP 111/77   Pulse 89   Temp 98.3 F (36.8 C)   Ht 5' 3 (1.6 m)   Wt 126 lb 6.4 oz (57.3 kg)   BMI 22.39 kg/m  GENERAL: The patient is AO x3, in no acute distress. HEENT: Head is normocephalic and atraumatic. EOMI are intact. Mouth is well hydrated and without lesions. NECK: Supple. No masses LUNGS: Clear to auscultation. No presence of rhonchi/wheezing/rales. Adequate chest expansion HEART: RRR, normal s1 and s2. ABDOMEN: Soft, nontender, no guarding, no peritoneal signs, and nondistended. BS +. No masses. Multiple scar tissue with ventral hernia  EXTREMITIES: Without any cyanosis, clubbing, rash, lesions or edema NEUROLOGIC: AOx3, no focal motor deficit. SKIN: no jaundice, no rashes   Imaging/Labs: as above  I personally reviewed and interpreted the available labs, imaging and endoscopic files.  2020  The stomach is located normally in the left upper abdomen.   Solid gastric emptying at   57 minutes is 20% (normal range at   60 minutes  is >10%).  Solid gastric emptying at 118 minutes is 35% (normal range at 120 minutes  is >40%).  Solid gastric emptying at 234 minutes is 43% (normal range at 240 minutes  is >90%).   Impression :   Solid gastric emptying is normal at 57 minutes and delayed at 118 and 234  minutes; see above discussion.   Impression and Plan:  [name] [name] is a 52 y.o. female with type 1 diabetes, chronic pancreatitis status post whipple 2011, ventral hernia containing transverse colon  who presents for evaluation of steatorrhea, diabetic gastroparesis, constipation and colorectal cancer screening.  # Steatorrhea # Pancreatic insufficiency  Patient is known to have pancreatic insufficiency due to having a chronic pancreatitis due to type 1 diabetes status post Whipple's procedure  Prescribed [name] [name]  as patient was unable to tolerate Creon  previously because of abdominal pain and worsening nausea.  Reports she did well for years but now feels [name] [name]  is also leading to abdominal pain and constipation  Low-fat diet and  pancrelipase  before every meal  Will decrease dose of [name] [name]   # Gastroparesis # Constipation  Patient is on narcotics (oxycodone  and long-acting opioids) , continues to be constipated with colonoscopy poor prep   Patient continues to have post prandial abdominal pain and nausea   Advise adequate glucose control.  Low-fat and low fiber diet  Patient can not take metoclopramide due to allergies , info was given for domperidone  Patient had inadequate response to lubiprostone  maximum dose, MiraLAX  twice daily  Reports previously did well with Linzess samples and hence we will prescribe maximum dose 290 mcg Will also start patient on Prucalopride 2mg  once  daily which will help with both opiod induced constipation and underlying gastroparesis  #CRC Screening  I had attempted colonoscopy September 2024 and 01/2024 but it was poor prep   Patient has ventral hernia for surgical evaluation was advised that her colonoscopy may be challenging and there is a small risk of incarcerated hernia given transverse colon and hernia sac  Patient was unable to drink bowel prep twice due to nausea and vomiting, once constipation is well controlled than will attempt repeat colonoscopy with  Sutab , and Linzess.   All questions were answered.      [name] Tarry Faizan Miamor Ayler, MD Gastroenterology and Hepatology [facility] Gastroenterology   This chart has been  completed using [facility] Dictation software, and while attempts have been made to ensure accuracy , certain words and phrases may not be transcribed as intended

## 2024-07-28 NOTE — Patient Instructions (Addendum)
 It was very nice to meet you today, as dicussed with will plan for the following :  1) Decrease dose of ZENPEP  ; prescribed  2) Linzess daily  3) prucalopride daily

## 2024-07-28 NOTE — Telephone Encounter (Signed)
 PA completed via cover my meds for Motegrity  2 mg tablets.

## 2024-07-29 ENCOUNTER — Ambulatory Visit

## 2024-07-29 ENCOUNTER — Telehealth (INDEPENDENT_AMBULATORY_CARE_PROVIDER_SITE_OTHER): Payer: Self-pay | Admitting: Gastroenterology

## 2024-07-29 NOTE — Telephone Encounter (Signed)
 Hoy from [employer] stating she is needing to know if pt is to be on Motegrity  and Linzess both. Per office note from yesterday pt is to be on Motegrity  and Linzess daily. Returned call to [place] and informed her

## 2024-07-29 NOTE — Telephone Encounter (Signed)
 Correction-PA is for Prucalopride 2 mg Prucalopride 2 mg approved.

## 2024-07-30 ENCOUNTER — Telehealth: Payer: Self-pay | Admitting: *Deleted

## 2024-07-30 NOTE — Telephone Encounter (Signed)
 Cook, Jayce G, DO     07/30/24 10:41 AM I don't know what this is regarding. He should re-fax.

## 2024-07-30 NOTE — Telephone Encounter (Signed)
 Copied from CRM [id number]. Topic: General - Other >> Jul 29, 2024  4:28 PM Joesph NOVAK wrote: Reason for CRM: Fransisco from [facility] is calling about a fax he sent over 07/15/24. He is waiting on a response from the provider.  [phone] FAX#[phone].

## 2024-07-31 NOTE — Telephone Encounter (Signed)
 [employer] to refax the form

## 2024-08-06 ENCOUNTER — Ambulatory Visit: Admitting: Family Medicine

## 2024-08-13 ENCOUNTER — Ambulatory Visit: Admitting: Nurse Practitioner

## 2024-08-13 DIAGNOSIS — Z794 Long term (current) use of insulin: Secondary | ICD-10-CM

## 2024-08-13 DIAGNOSIS — E10649 Type 1 diabetes mellitus with hypoglycemia without coma: Secondary | ICD-10-CM

## 2024-08-17 ENCOUNTER — Ambulatory Visit: Admitting: Nurse Practitioner

## 2024-08-17 DIAGNOSIS — E10649 Type 1 diabetes mellitus with hypoglycemia without coma: Secondary | ICD-10-CM

## 2024-08-17 DIAGNOSIS — Z794 Long term (current) use of insulin: Secondary | ICD-10-CM

## 2024-08-31 ENCOUNTER — Other Ambulatory Visit: Payer: Self-pay | Admitting: Family Medicine

## 2024-08-31 DIAGNOSIS — G4709 Other insomnia: Secondary | ICD-10-CM

## 2024-09-11 ENCOUNTER — Ambulatory Visit: Admitting: Nurse Practitioner

## 2024-09-16 DIAGNOSIS — K30 Functional dyspepsia: Secondary | ICD-10-CM | POA: Diagnosis not present

## 2024-09-16 DIAGNOSIS — Z79899 Other long term (current) drug therapy: Secondary | ICD-10-CM | POA: Diagnosis not present

## 2024-09-16 DIAGNOSIS — G894 Chronic pain syndrome: Secondary | ICD-10-CM | POA: Diagnosis not present

## 2024-09-16 DIAGNOSIS — L720 Epidermal cyst: Secondary | ICD-10-CM | POA: Diagnosis not present

## 2024-09-16 DIAGNOSIS — Z6822 Body mass index (BMI) 22.0-22.9, adult: Secondary | ICD-10-CM | POA: Diagnosis not present

## 2024-09-16 DIAGNOSIS — R109 Unspecified abdominal pain: Secondary | ICD-10-CM | POA: Diagnosis not present

## 2024-09-16 DIAGNOSIS — F1120 Opioid dependence, uncomplicated: Secondary | ICD-10-CM | POA: Diagnosis not present

## 2024-09-22 ENCOUNTER — Ambulatory Visit: Payer: Self-pay

## 2024-09-22 NOTE — Telephone Encounter (Signed)
 FYI Only or Action Required?: FYI only for provider: appointment scheduled on 09/22/24.  Patient was last seen in primary care on 05/06/2024 by Cook, Jayce G, DO.  Called Nurse Triage reporting Wound Infection.  Symptoms began a week ago.  Interventions attempted: Prescription medications: doxycycline .  Symptoms are: gradually improving.  Triage Disposition: See Physician Within 24 Hours  Patient/caregiver understands and will follow disposition?: Yes Reason for Disposition  [1] Pus or cloudy fluid draining from wound AND [2] no fever  [1] Wound > 48 hours old AND [2] becoming more painful or tender to touch  Answer Assessment - Initial Assessment Questions Wound occurred after popping a cyst or abscess using a scalpel at home. States scalpel was sealed/sterile. States was prescribed doxycycline  from dermatologist 09/16/24, is supposed to finish it 09/30/24. Wound remains open with drainage. Unknown last tetanus. Stated derm did not culture the wound.  1. LOCATION: Where is the wound located?      Back of neck  2. WOUND APPEARANCE: What does the wound look like?      Open, some pus visible  3. SIZE: If redness is present, ask: What is the size of the red area? (Inches, centimeters, or compare to size of a coin)      Fingertip  4. ONSET: When did it start to look infected?      2 weeks ago  5. MECHANISM: How did the wound start, what was the cause?     After draining abscess or cyst  6. PAIN: Do you have any pain?  If Yes, ask: How bad is the pain?  (e.g., Scale 1-10; mild, moderate, or severe)     2/10 when not touching  7. FEVER: Do you have a fever? If Yes, ask: What is your temperature, how was it measured, and when did it start?     Denies  8. OTHER SYMPTOMS: Do you have any other symptoms? (e.g., shaking chills, weakness, rash elsewhere on body)     Denies  Protocols used: Wound Infection Suspected-A-AH Copied from CRM #[id number]. Topic: Clinical -  Red Word Triage >> Sep 22, 2024 10:58 AM Ivette P wrote: Red Word that prompted transfer to Nurse Triage: knot back of neck- daughter squeezed and alot gush, puss came out and had a foul odor. open wound. went to dermatologist and did not give any any antibiotic or anything.

## 2024-09-23 ENCOUNTER — Other Ambulatory Visit: Payer: Self-pay | Admitting: Family Medicine

## 2024-09-23 ENCOUNTER — Telehealth: Payer: Self-pay | Admitting: *Deleted

## 2024-09-23 ENCOUNTER — Ambulatory Visit (INDEPENDENT_AMBULATORY_CARE_PROVIDER_SITE_OTHER)

## 2024-09-23 VITALS — BP 126/81 | HR 84 | Temp 97.2°F | Ht 63.0 in | Wt 127.5 lb

## 2024-09-23 DIAGNOSIS — L723 Sebaceous cyst: Secondary | ICD-10-CM

## 2024-09-23 DIAGNOSIS — L089 Local infection of the skin and subcutaneous tissue, unspecified: Secondary | ICD-10-CM | POA: Diagnosis not present

## 2024-09-23 NOTE — Telephone Encounter (Unsigned)
 Copied from CRM #[id number]. Topic: Referral - Question >> Sep 23, 2024  3:33 PM Fonda T wrote: Reason for CRM: Pt calling, states she was referred to general surgery office, [facility], and was contacted by the office to state that they do not have any appts to address the reason for referral, Infected sebaceous cyst.  Pt is requesting another referral to another provider.  Pt requesting a follow up call can be reached at [phone].  Location preference- [place], or [place], or provider recommendation. [place] is fine if that is an option.

## 2024-09-23 NOTE — Progress Notes (Signed)
° °  Acute Office Visit  Subjective:     Patient ID: [name] [name], female    DOB: March 17, 1971, 53 y.o.   MRN: [id number]  Chief Complaint  Patient presents with   Wound Check    Patient is here for having a open wound on the back of her neck. It is a small opening with a red tint to it. She has had a knot there for about 6 months now. Patients daughter used an instrument and scraping it off    Wound Check  Patient is in today for a sebaceous cyst that is on the back of her neck. Reports that the cyst has been there for 6 months, and recently became infected in the past two weeks. Was seen by dermatology last Wednesday, and he prescribed doxycycline . Reports that daughter used a clean scalpel to drain the cyst, and odorous cottage cheese-like pus came out of the lesion. Patient is on day 7 of 14 of the antibiotic, but says it is still oozing pus out of site. Denies fever, body aches or chills. Patient has a history of diabetes.   Review of Systems  Constitutional:  Negative for chills, fatigue and fever.  Respiratory:  Negative for cough, chest tightness, shortness of breath and wheezing.   Cardiovascular:  Negative for chest pain and palpitations.  Skin:  Positive for wound.      Objective:    Today's Vitals   09/23/24 0929  BP: 126/81  Pulse: 84  Temp: (!) 97.2 F (36.2 C)  SpO2: 98%  Weight: 127 lb 8 oz (57.8 kg)  Height: 5' 3 (1.6 m)   Body mass index is 22.59 kg/m.   Physical Exam Vitals and nursing note reviewed.  Constitutional:      General: She is not in acute distress.    Appearance: Normal appearance. She is not ill-appearing or toxic-appearing.  Cardiovascular:     Rate and Rhythm: Normal rate and regular rhythm.     Heart sounds: Normal heart sounds. No murmur heard. Pulmonary:     Effort: Pulmonary effort is normal. No respiratory distress.     Breath sounds: Normal breath sounds. No wheezing.  Skin:    Findings: Lesion present.     Comments: Flat  subcutaneous lesion consistent with a sebaceous cyst with apparent subcutaneous tunneling/sinus tract formation. Central 2-mm hole due to puncturing with active expression of serosanguinous purulent drainage. Surrounding erythema present.   Neurological:     Mental Status: She is alert.  Psychiatric:        Mood and Affect: Mood normal.        Behavior: Behavior normal.        Thought Content: Thought content normal.        Judgment: Judgment normal.    Assessment & Plan:  1. Infected sebaceous cyst (Primary) -Advised patient to finish antibiotics as prescribed. Referred to general surgery for incision and drainage of the lesion.  -Educated patient on signs of infection including fever, chills and body aches and to get evaluated if she experiences any of these symptoms. Advised her that it is important to monitor for infection due to her increased risk related to her diabetes. Agrees with plan.  - Ambulatory referral to General Surgery  Return if symptoms worsen or fail to improve.  Damien KATHEE Pringle, FNP

## 2024-09-24 ENCOUNTER — Other Ambulatory Visit: Payer: Self-pay

## 2024-09-24 DIAGNOSIS — L723 Sebaceous cyst: Secondary | ICD-10-CM

## 2024-09-29 ENCOUNTER — Other Ambulatory Visit (INDEPENDENT_AMBULATORY_CARE_PROVIDER_SITE_OTHER): Payer: Self-pay | Admitting: Gastroenterology

## 2024-09-29 DIAGNOSIS — K5903 Drug induced constipation: Secondary | ICD-10-CM

## 2024-09-29 DIAGNOSIS — K581 Irritable bowel syndrome with constipation: Secondary | ICD-10-CM

## 2024-09-29 DIAGNOSIS — K3184 Gastroparesis: Secondary | ICD-10-CM

## 2024-10-13 ENCOUNTER — Other Ambulatory Visit (HOSPITAL_COMMUNITY): Payer: Self-pay

## 2024-10-13 ENCOUNTER — Encounter (HOSPITAL_COMMUNITY): Payer: Self-pay | Admitting: Family Medicine

## 2024-10-13 ENCOUNTER — Telehealth (HOSPITAL_COMMUNITY): Payer: Self-pay | Admitting: Pharmacy Technician

## 2024-10-13 NOTE — Telephone Encounter (Signed)
 Pharmacy Patient Advocate Encounter   Received notification from [facility] KEY that prior authorization for [facility] 3 is due for renewal.   Insurance verification completed.   The patient is insured through [employer].  No PA needed at this time. New rx for Dexcom last, but pt filled Freestyle Libre 3 plus in Dec. Archived Key: [id number]

## 2024-10-22 ENCOUNTER — Ambulatory Visit: Admitting: Surgery

## 2024-11-12 ENCOUNTER — Ambulatory Visit

## 2024-11-12 ENCOUNTER — Other Ambulatory Visit: Payer: Self-pay | Admitting: Emergency Medicine

## 2024-11-12 VITALS — BP 119/74 | HR 92 | Resp 16

## 2024-11-12 DIAGNOSIS — Z95828 Presence of other vascular implants and grafts: Secondary | ICD-10-CM

## 2024-11-12 DIAGNOSIS — Z1159 Encounter for screening for other viral diseases: Secondary | ICD-10-CM

## 2024-11-12 MED ORDER — SODIUM CHLORIDE 0.9% FLUSH: 10.0000 mL | Freq: Once | INTRAVENOUS | Status: DC | PRN

## 2024-11-12 MED ORDER — HEPARIN SOD (PORK) LOCK FLUSH 100 UNIT/ML IV SOLN
250.0000 [IU] | Freq: Once | INTRAVENOUS | Status: AC | PRN
  Administered 2024-11-12: 250 [IU]

## 2024-11-12 NOTE — Progress Notes (Signed)
 [name] [name] presented for Portacath access and flush.  Proper placement of portacath confirmed by CXR.  Portacath located left chest wall accessed with  H 22 needle.  Good blood return present. Portacath flushed with 20ml NS and 500U/74ml Heparin  and needle removed intact.  Procedure tolerated well and without incident.

## 2024-11-13 LAB — CBC WITH DIFFERENTIAL/PLATELET
Basophils Absolute: 0 10*3/uL (ref 0.0–0.2)
Basos: 0 %
EOS (ABSOLUTE): 0.1 10*3/uL (ref 0.0–0.4)
Eos: 1 %
Hematocrit: 37.2 % (ref 34.0–46.6)
Hemoglobin: 12.6 g/dL (ref 11.1–15.9)
Immature Grans (Abs): 0 10*3/uL (ref 0.0–0.1)
Immature Granulocytes: 0 %
Lymphocytes Absolute: 3.1 10*3/uL (ref 0.7–3.1)
Lymphs: 50 %
MCH: 33.7 pg — ABNORMAL HIGH (ref 26.6–33.0)
MCHC: 33.9 g/dL (ref 31.5–35.7)
MCV: 100 fL — ABNORMAL HIGH (ref 79–97)
Monocytes Absolute: 0.4 10*3/uL (ref 0.1–0.9)
Monocytes: 6 %
Neutrophils Absolute: 2.7 10*3/uL (ref 1.4–7.0)
Neutrophils: 43 %
Platelets: 189 10*3/uL (ref 150–450)
RBC: 3.74 x10E6/uL — ABNORMAL LOW (ref 3.77–5.28)
RDW: 12.3 % (ref 11.7–15.4)
WBC: 6.3 10*3/uL (ref 3.4–10.8)

## 2024-11-13 LAB — COMPREHENSIVE METABOLIC PANEL WITH GFR
ALT: 34 [IU]/L — ABNORMAL HIGH (ref 0–32)
AST: 23 [IU]/L (ref 0–40)
Albumin: 3.8 g/dL (ref 3.8–4.9)
Alkaline Phosphatase: 141 [IU]/L — ABNORMAL HIGH (ref 49–135)
BUN/Creatinine Ratio: 26 — ABNORMAL HIGH (ref 9–23)
BUN: 17 mg/dL (ref 6–24)
Bilirubin Total: 0.4 mg/dL (ref 0.0–1.2)
CO2: 20 mmol/L (ref 20–29)
Calcium: 8.5 mg/dL — ABNORMAL LOW (ref 8.7–10.2)
Chloride: 110 mmol/L — ABNORMAL HIGH (ref 96–106)
Creatinine, Ser: 0.66 mg/dL (ref 0.57–1.00)
Globulin, Total: 2.4 g/dL (ref 1.5–4.5)
Glucose: 53 mg/dL — ABNORMAL LOW (ref 70–99)
Potassium: 4.1 mmol/L (ref 3.5–5.2)
Sodium: 144 mmol/L (ref 134–144)
Total Protein: 6.2 g/dL (ref 6.0–8.5)
eGFR: 105 mL/min/{1.73_m2}

## 2024-11-13 LAB — HEMOGLOBIN A1C
Est. average glucose Bld gHb Est-mCnc: 166 mg/dL
Hgb A1c MFr Bld: 7.4 % — ABNORMAL HIGH (ref 4.8–5.6)

## 2024-11-13 LAB — HEPATITIS B CORE ANTIBODY, TOTAL: Hep B Core Total Ab: NEGATIVE

## 2024-11-13 LAB — HEPATITIS B SURFACE ANTIGEN: Hepatitis B Surface Ag: NEGATIVE

## 2024-11-13 LAB — HEPATITIS C ANTIBODY: Hep C Virus Ab: NONREACTIVE

## 2024-11-26 ENCOUNTER — Ambulatory Visit

## 2025-02-04 ENCOUNTER — Ambulatory Visit
